# Patient Record
Sex: Female | Born: 1945
Health system: Southern US, Community
[De-identification: ages and names within clinical notes are randomized; demographics above are authoritative.]

## PROBLEM LIST (undated history)

## (undated) DIAGNOSIS — I491 Atrial premature depolarization: Secondary | ICD-10-CM

## (undated) DIAGNOSIS — Z8719 Personal history of other diseases of the digestive system: Secondary | ICD-10-CM

## (undated) DIAGNOSIS — Z8619 Personal history of other infectious and parasitic diseases: Secondary | ICD-10-CM

## (undated) DIAGNOSIS — F419 Anxiety disorder, unspecified: Secondary | ICD-10-CM

## (undated) DIAGNOSIS — I429 Cardiomyopathy, unspecified: Secondary | ICD-10-CM

## (undated) DIAGNOSIS — E669 Obesity, unspecified: Secondary | ICD-10-CM

## (undated) DIAGNOSIS — I4819 Other persistent atrial fibrillation: Secondary | ICD-10-CM

## (undated) DIAGNOSIS — K581 Irritable bowel syndrome with constipation: Secondary | ICD-10-CM

## (undated) DIAGNOSIS — K219 Gastro-esophageal reflux disease without esophagitis: Secondary | ICD-10-CM

## (undated) DIAGNOSIS — N183 Chronic kidney disease, stage 3 unspecified: Secondary | ICD-10-CM

## (undated) DIAGNOSIS — F329 Major depressive disorder, single episode, unspecified: Secondary | ICD-10-CM

## (undated) DIAGNOSIS — J302 Other seasonal allergic rhinitis: Secondary | ICD-10-CM

## (undated) DIAGNOSIS — L900 Lichen sclerosus et atrophicus: Secondary | ICD-10-CM

## (undated) DIAGNOSIS — E785 Hyperlipidemia, unspecified: Secondary | ICD-10-CM

## (undated) DIAGNOSIS — M47812 Spondylosis without myelopathy or radiculopathy, cervical region: Secondary | ICD-10-CM

## (undated) DIAGNOSIS — I6529 Occlusion and stenosis of unspecified carotid artery: Secondary | ICD-10-CM

## (undated) DIAGNOSIS — I82409 Acute embolism and thrombosis of unspecified deep veins of unspecified lower extremity: Secondary | ICD-10-CM

## (undated) DIAGNOSIS — I714 Abdominal aortic aneurysm, without rupture, unspecified: Secondary | ICD-10-CM

## (undated) DIAGNOSIS — I428 Other cardiomyopathies: Secondary | ICD-10-CM

## (undated) DIAGNOSIS — I77811 Abdominal aortic ectasia: Secondary | ICD-10-CM

## (undated) DIAGNOSIS — T7840XA Allergy, unspecified, initial encounter: Secondary | ICD-10-CM

## (undated) DIAGNOSIS — N289 Disorder of kidney and ureter, unspecified: Secondary | ICD-10-CM

## (undated) DIAGNOSIS — M1990 Unspecified osteoarthritis, unspecified site: Secondary | ICD-10-CM

## (undated) DIAGNOSIS — F32A Depression, unspecified: Secondary | ICD-10-CM

## (undated) DIAGNOSIS — I10 Essential (primary) hypertension: Secondary | ICD-10-CM

## (undated) DIAGNOSIS — I499 Cardiac arrhythmia, unspecified: Secondary | ICD-10-CM

## (undated) HISTORY — DX: Other cardiomyopathies: I42.8

## (undated) HISTORY — DX: Irritable bowel syndrome with constipation: K58.1

## (undated) HISTORY — DX: Abdominal aortic aneurysm, without rupture, unspecified: I71.40

## (undated) HISTORY — DX: Atrial premature depolarization: I49.1

## (undated) HISTORY — DX: Major depressive disorder, single episode, unspecified: F32.9

## (undated) HISTORY — DX: Personal history of other diseases of the digestive system: Z87.19

## (undated) HISTORY — PX: ABDOMINAL HYSTERECTOMY: SHX81

## (undated) HISTORY — PX: OTHER SURGICAL HISTORY: SHX169

## (undated) HISTORY — DX: Abdominal aortic ectasia: I77.811

## (undated) HISTORY — DX: Depression, unspecified: F32.A

## (undated) HISTORY — DX: Anxiety disorder, unspecified: F41.9

## (undated) HISTORY — DX: Occlusion and stenosis of unspecified carotid artery: I65.29

## (undated) HISTORY — DX: Obesity, unspecified: E66.9

## (undated) HISTORY — DX: Cardiomyopathy, unspecified: I42.9

## (undated) HISTORY — PX: CHOLECYSTECTOMY: SHX55

## (undated) HISTORY — DX: Hyperlipidemia, unspecified: E78.5

## (undated) HISTORY — DX: Gastro-esophageal reflux disease without esophagitis: K21.9

## (undated) HISTORY — DX: Other seasonal allergic rhinitis: J30.2

## (undated) HISTORY — PX: ABDOMINAL AORTIC ANEURYSM REPAIR: SUR1152

## (undated) HISTORY — DX: Essential (primary) hypertension: I10

## (undated) HISTORY — DX: Spondylosis without myelopathy or radiculopathy, cervical region: M47.812

## (undated) HISTORY — DX: Chronic kidney disease, stage 3 unspecified: N18.30

## (undated) HISTORY — DX: Other persistent atrial fibrillation: I48.19

## (undated) HISTORY — PX: CARPAL TUNNEL RELEASE: SHX101

## (undated) HISTORY — DX: Allergy, unspecified, initial encounter: T78.40XA

## (undated) HISTORY — PX: TOTAL ABDOMINAL HYSTERECTOMY W/ BILATERAL SALPINGOOPHORECTOMY: SHX83

## (undated) HISTORY — DX: Unspecified osteoarthritis, unspecified site: M19.90

## (undated) HISTORY — DX: Lichen sclerosus et atrophicus: L90.0

## (undated) HISTORY — DX: Personal history of other infectious and parasitic diseases: Z86.19

---

## 1998-03-08 ENCOUNTER — Other Ambulatory Visit: Admission: RE | Admit: 1998-03-08 | Discharge: 1998-03-08 | Payer: Self-pay | Admitting: Obstetrics and Gynecology

## 1998-12-20 ENCOUNTER — Observation Stay (HOSPITAL_COMMUNITY): Admission: RE | Admit: 1998-12-20 | Discharge: 1998-12-21 | Payer: Self-pay | Admitting: Obstetrics and Gynecology

## 1998-12-20 ENCOUNTER — Encounter: Payer: Self-pay | Admitting: Obstetrics and Gynecology

## 1999-04-11 ENCOUNTER — Encounter: Admission: RE | Admit: 1999-04-11 | Discharge: 1999-07-10 | Payer: Self-pay | Admitting: Obstetrics and Gynecology

## 1999-05-30 ENCOUNTER — Encounter: Admission: RE | Admit: 1999-05-30 | Discharge: 1999-05-30 | Payer: Self-pay | Admitting: Obstetrics and Gynecology

## 1999-05-30 ENCOUNTER — Encounter: Payer: Self-pay | Admitting: Obstetrics and Gynecology

## 1999-06-28 ENCOUNTER — Other Ambulatory Visit: Admission: RE | Admit: 1999-06-28 | Discharge: 1999-06-28 | Payer: Self-pay | Admitting: Obstetrics and Gynecology

## 2000-06-04 ENCOUNTER — Encounter: Payer: Self-pay | Admitting: Obstetrics and Gynecology

## 2000-06-04 ENCOUNTER — Encounter: Admission: RE | Admit: 2000-06-04 | Discharge: 2000-06-04 | Payer: Self-pay | Admitting: Obstetrics and Gynecology

## 2000-07-01 ENCOUNTER — Other Ambulatory Visit: Admission: RE | Admit: 2000-07-01 | Discharge: 2000-07-01 | Payer: Self-pay | Admitting: Obstetrics and Gynecology

## 2000-07-16 HISTORY — PX: COLONOSCOPY: SHX174

## 2001-04-08 ENCOUNTER — Encounter (INDEPENDENT_AMBULATORY_CARE_PROVIDER_SITE_OTHER): Payer: Self-pay

## 2001-04-08 ENCOUNTER — Other Ambulatory Visit: Admission: RE | Admit: 2001-04-08 | Discharge: 2001-04-08 | Payer: Self-pay | Admitting: Gastroenterology

## 2001-04-23 ENCOUNTER — Encounter: Payer: Self-pay | Admitting: Gastroenterology

## 2001-04-23 ENCOUNTER — Ambulatory Visit (HOSPITAL_COMMUNITY): Admission: RE | Admit: 2001-04-23 | Discharge: 2001-04-23 | Payer: Self-pay | Admitting: Gastroenterology

## 2001-06-05 ENCOUNTER — Encounter: Admission: RE | Admit: 2001-06-05 | Discharge: 2001-06-05 | Payer: Self-pay | Admitting: Family Medicine

## 2001-06-05 ENCOUNTER — Encounter: Payer: Self-pay | Admitting: Family Medicine

## 2002-06-10 ENCOUNTER — Encounter: Admission: RE | Admit: 2002-06-10 | Discharge: 2002-06-10 | Payer: Self-pay | Admitting: Family Medicine

## 2002-06-10 ENCOUNTER — Encounter: Payer: Self-pay | Admitting: Family Medicine

## 2002-07-16 HISTORY — PX: ESOPHAGOGASTRODUODENOSCOPY ENDOSCOPY: SHX5814

## 2003-06-24 ENCOUNTER — Encounter: Admission: RE | Admit: 2003-06-24 | Discharge: 2003-06-24 | Payer: Self-pay | Admitting: Family Medicine

## 2003-07-20 ENCOUNTER — Encounter: Admission: RE | Admit: 2003-07-20 | Discharge: 2003-07-20 | Payer: Self-pay | Admitting: Family Medicine

## 2003-09-22 ENCOUNTER — Encounter: Admission: RE | Admit: 2003-09-22 | Discharge: 2003-09-22 | Payer: Self-pay | Admitting: Family Medicine

## 2004-07-13 ENCOUNTER — Ambulatory Visit: Payer: Self-pay | Admitting: Family Medicine

## 2004-07-20 ENCOUNTER — Ambulatory Visit: Payer: Self-pay | Admitting: Family Medicine

## 2004-07-21 ENCOUNTER — Encounter: Admission: RE | Admit: 2004-07-21 | Discharge: 2004-07-21 | Payer: Self-pay | Admitting: Family Medicine

## 2004-11-23 ENCOUNTER — Ambulatory Visit: Payer: Self-pay | Admitting: Family Medicine

## 2005-06-21 ENCOUNTER — Ambulatory Visit: Payer: Self-pay | Admitting: Family Medicine

## 2005-07-17 ENCOUNTER — Ambulatory Visit: Payer: Self-pay | Admitting: Family Medicine

## 2005-08-03 ENCOUNTER — Encounter: Admission: RE | Admit: 2005-08-03 | Discharge: 2005-08-03 | Payer: Self-pay | Admitting: Family Medicine

## 2005-09-24 ENCOUNTER — Ambulatory Visit: Payer: Self-pay | Admitting: Family Medicine

## 2006-08-05 ENCOUNTER — Encounter: Admission: RE | Admit: 2006-08-05 | Discharge: 2006-08-05 | Payer: Self-pay | Admitting: Family Medicine

## 2007-08-07 ENCOUNTER — Encounter: Admission: RE | Admit: 2007-08-07 | Discharge: 2007-08-07 | Payer: Self-pay | Admitting: Family Medicine

## 2008-07-15 ENCOUNTER — Encounter: Admission: RE | Admit: 2008-07-15 | Discharge: 2008-07-15 | Payer: Self-pay | Admitting: Family Medicine

## 2008-08-09 ENCOUNTER — Encounter: Admission: RE | Admit: 2008-08-09 | Discharge: 2008-08-09 | Payer: Self-pay | Admitting: Family Medicine

## 2008-08-17 ENCOUNTER — Encounter: Admission: RE | Admit: 2008-08-17 | Discharge: 2008-08-17 | Payer: Self-pay | Admitting: Family Medicine

## 2008-08-25 ENCOUNTER — Ambulatory Visit (HOSPITAL_COMMUNITY): Admission: RE | Admit: 2008-08-25 | Discharge: 2008-08-25 | Payer: Self-pay | Admitting: Neurosurgery

## 2008-09-09 DIAGNOSIS — M47812 Spondylosis without myelopathy or radiculopathy, cervical region: Secondary | ICD-10-CM | POA: Insufficient documentation

## 2008-09-09 DIAGNOSIS — IMO0002 Reserved for concepts with insufficient information to code with codable children: Secondary | ICD-10-CM | POA: Insufficient documentation

## 2008-09-10 ENCOUNTER — Ambulatory Visit: Payer: Self-pay | Admitting: Critical Care Medicine

## 2008-09-10 DIAGNOSIS — J8410 Pulmonary fibrosis, unspecified: Secondary | ICD-10-CM | POA: Insufficient documentation

## 2008-09-15 ENCOUNTER — Encounter: Payer: Self-pay | Admitting: Critical Care Medicine

## 2008-09-15 ENCOUNTER — Ambulatory Visit: Payer: Self-pay | Admitting: Critical Care Medicine

## 2008-09-15 ENCOUNTER — Ambulatory Visit: Admission: RE | Admit: 2008-09-15 | Discharge: 2008-09-15 | Payer: Self-pay | Admitting: Critical Care Medicine

## 2008-09-20 ENCOUNTER — Ambulatory Visit: Payer: Self-pay | Admitting: Critical Care Medicine

## 2008-09-21 ENCOUNTER — Encounter: Payer: Self-pay | Admitting: Critical Care Medicine

## 2008-09-27 ENCOUNTER — Encounter: Payer: Self-pay | Admitting: Critical Care Medicine

## 2008-09-28 ENCOUNTER — Ambulatory Visit: Payer: Self-pay | Admitting: Critical Care Medicine

## 2008-10-07 ENCOUNTER — Encounter: Admission: RE | Admit: 2008-10-07 | Discharge: 2008-10-07 | Payer: Self-pay | Admitting: Neurosurgery

## 2008-10-29 ENCOUNTER — Encounter: Admission: RE | Admit: 2008-10-29 | Discharge: 2008-10-29 | Payer: Self-pay | Admitting: Neurosurgery

## 2008-12-20 ENCOUNTER — Ambulatory Visit: Payer: Self-pay | Admitting: Critical Care Medicine

## 2009-08-10 ENCOUNTER — Encounter: Admission: RE | Admit: 2009-08-10 | Discharge: 2009-08-10 | Payer: Self-pay | Admitting: Family Medicine

## 2010-08-14 ENCOUNTER — Encounter
Admission: RE | Admit: 2010-08-14 | Discharge: 2010-08-14 | Payer: Self-pay | Source: Home / Self Care | Attending: Family Medicine | Admitting: Family Medicine

## 2010-08-17 NOTE — Miscellaneous (Signed)
Summary: Bronchoscopy  Clinical Lists Changes  Observations: Added new observation of BRONCHOSCOPY: PROCEDURE:  The Pentax bronchoscope was introduced via the right naris. The upper airways were visualized and remarkable.  The entire tracheobronchial tree was visualized and revealed no endobronchial lesions.  Attention was then paid to the left upper lobe orifice.  The left upper lobe anterior segment was biopsied times five.  Washings were obtained.  Complications were none.   IMPRESSION:  Left upper lobe pulmonary infiltrate, rule out bronchiolitis obliterans, organized pneumonia.   RECOMMENDATIONS:  Follow up microbiology and pathology.    (09/15/2008 9:10)      Bronchoscopy  Procedure date:  09/15/2008  Findings:      PROCEDURE:  The Pentax bronchoscope was introduced via the right naris. The upper airways were visualized and remarkable.  The entire tracheobronchial tree was visualized and revealed no endobronchial lesions.  Attention was then paid to the left upper lobe orifice.  The left upper lobe anterior segment was biopsied times five.  Washings were obtained.  Complications were none.   IMPRESSION:  Left upper lobe pulmonary infiltrate, rule out bronchiolitis obliterans, organized pneumonia.   RECOMMENDATIONS:  Follow up microbiology and pathology.     Comments:      Specimen - Transbronchial Biopsy : Location:L[facility]   PROCEDURE:  The Pentax bronchoscope was introduced via the right naris. The upper airways were visualized and remarkable.  The entire tracheobronchial tree was visualized and revealed no endobronchial lesions.  Attention was then paid to the left upper lobe orifice.  The left upper lobe anterior segment was biopsied times five.  Washings were obtained.  Complications were none.   IMPRESSION:  Left upper lobe pulmonary infiltrate, rule out bronchiolitis obliterans, organized pneumonia.   RECOMMENDATIONS:  Follow up microbiology and  pathology.      Bronchoscopy  Procedure date:  09/15/2008  Findings:      PROCEDURE:  The Pentax bronchoscope was introduced via the right naris. The upper airways were visualized and remarkable.  The entire tracheobronchial tree was visualized and revealed no endobronchial lesions.  Attention was then paid to the left upper lobe orifice.  The left upper lobe anterior segment was biopsied times five.  Washings were obtained.  Complications were none.   IMPRESSION:  Left upper lobe pulmonary infiltrate, rule out bronchiolitis obliterans, organized pneumonia.   RECOMMENDATIONS:  Follow up microbiology and pathology.     Comments:      Specimen - Transbronchial Biopsy : Location:L[facility]   PROCEDURE:  The Pentax bronchoscope was introduced via the right naris. The upper airways were visualized and remarkable.  The entire tracheobronchial tree was visualized and revealed no endobronchial lesions.  Attention was then paid to the left upper lobe orifice.  The left upper lobe anterior segment was biopsied times five.  Washings were obtained.  Complications were none.   IMPRESSION:  Left upper lobe pulmonary infiltrate, rule out bronchiolitis obliterans, organized pneumonia.   RECOMMENDATIONS:  Follow up microbiology and pathology.

## 2010-08-17 NOTE — Miscellaneous (Signed)
Summary: Orders Update pft charges  Clinical Lists Changes  Orders: Added new Service order of Carbon Monoxide diffusing w/capacity (94720) - Signed Added new Service order of Lung Volumes (94240) - Signed Added new Service order of Spirometry (Pre & Post) (94060) - Signed 

## 2010-08-17 NOTE — Assessment & Plan Note (Signed)
Summary: Pulmonary Consultation   Referred by:  Dr. Newell Coral  Chief Complaint:  Pulmonary consult for abnormal CXr/CT chest..  History of Present Illness: Pulmonary Consultation     This is a 65 years old female who presents with an abnormal radiology finding.  The patient denies history of diagnosed COPD, shortness of breath, chest tightness, chest pain worse with breathing and coughing, wheezing, cough, mucous production, nocturnal awakening, exercise induced symptoms, and congestion.  The abnormality is described as pulmonary infiltrate.  The infiltrate  is described as left upper lobe and is ill-defined ground-glass attenuation.  No adenopathy is seen.  No other lesions are seen.     The pt was to have routine carpel tunnel surgery and was discovered to have an abn CXR.  F/U CT Chest revealed LUL abn with above noted changes.  Pt denies cough. There is no chest pain or wheeze. No f/c/s.  Had bronchitis several years ago now resolved.  No exposure hx.  Has had a dry cough for one month.   Pt has minimal smoking history.      Prior Medications Reviewed Using: List Brought by Patient  Updated Prior Medication List: CLONAZEPAM 0.5 MG TABS (CLONAZEPAM) 1 by mouth two times a day LISINOPRIL-HYDROCHLOROTHIAZIDE 10-12.5 MG TABS (LISINOPRIL-HYDROCHLOROTHIAZIDE) 1 by mouth daily SERTRALINE HCL 100 MG TABS (SERTRALINE HCL) 1 by mouth daily TRAMADOL-ACETAMINOPHEN 37.5-325 MG TABS (TRAMADOL-ACETAMINOPHEN) 1 by mouth every 8 hours prn VIVELLE-DOT 0.075 MG/24HR PTTW (ESTRADIOL) Apply one patch twice weekly PANTOPRAZOLE SODIUM 40 MG TBEC (PANTOPRAZOLE SODIUM) 1 by mouth daily  Current Allergies (reviewed today): No known allergies  Past Medical History:    Reviewed history from 09/09/2008 and no changes required:       Current Problems:        DEGENERATIVE DISC DISEASE (ICD-722.6)       SPONDYLITIS (ICD-720.9)       HYPERTENSION (ICD-401.9)       G E R D       Irritable Bowel  Syndrome  Past Surgical History:    Reviewed history from 09/09/2008 and no changes required:       Cholecystectomy  1998       Hysterectomy  1997       Right thumb surgery for torn ligament--30+ yrs ago       Carpal Tunnel R 2010       Bladder Tack  Family History:    Reviewed history from 09/09/2008 and no changes required:       Family History Diabetes       Family History Hypertension  Social History:    Reviewed history from 09/09/2008 and no changes required:       Patient never smoked.        Married.       Works as a Doctor, hospital at [employer]   Risk Factors:  Tobacco use:  quit    Year quit:  1990    Pack-years:  1-2 cigarettes for less than 1 year Passive smoke exposure:  no  Review of Systems      See HPI for Pulmonary, Cardiac, General, and ENT review of systems.  General      Denies fever, chills, sweats, fatigue, malaise, and weight loss.  Eyes      Denies blurring, eye irritation, and vision loss.  ENT      Complains of nasal congestion and post nasal drip.      Denies nosebleeds, sore throat, hoarseness, and difficulty swallowing.  CV  Complains of lightheadedness.      Denies difficulty breathing at night, near fainting, chest pain or discomfort, racing/skipping heart beats, shortness of breath with exertion, palpitations, swelling of hands or feet, difficulty breathing while lying down, and fainting.  Resp      Complains of cough.      Denies sleep distrurbances due to breathing, shortness of breath, difficulty breathing at rest, excessive sputum, coughing up blood, wheezing, pleurisy, and excessive snoring.  GI      Denies indigestion, vomiting blood, nausea, vomiting, bloody stools, and heartburn/acid reflux.  GU      Denies urinary frequency, blood in urine, foul urinary discharge, kidney pain, urinary urgency, inability to empty bladder, night time urination, painful urination, and trouble starting urinary stream.  MS       Complains of arthritis.      Denies joint swelling, joint pain, muscle cramps, muscle weakness, and muscle aches.  Derm      Denies suspicious lesions, itching, rash, night sweats, and excessive perspiration.  Neuro      Complains of headaches.      Denies poor balance, difficulty with concentration, inability to speak, numbness, tingling, falling down, weakness, seizures, visual disturbances, fainting, and dizziness.  Endo      Denies heat intolerance, cold intolerance, and excessive hunger.  The review of systems is negative for Heme/Lymph and Allergy.  Vital Signs:  Patient Profile:   65 Years Old Female Weight:      212.4 pounds O2 Sat:      95 % O2 treatment:    Room Air Temp:     98.4 degrees F oral Pulse rate:   71 / minute BP sitting:   124 / 68  (left arm) Cuff size:   regular  Vitals Entered By: Michel Bickers CMA (September 10, 2008 10:07 AM)                Physical Exam  Gen: Pleasant, well-nourished, in no distress,  normal affect ENT: No lesions,  mouth clear,  oropharynx clear, no postnasal drip Neck: No JVD, no TMG, no carotid bruits Lungs: No use of accessory muscles, no dullness to percussion, clear without rales or rhonchi Cardiovascular: RRR, heart sounds normal, no murmur or gallops, no peripheral edema Abdomen: soft and NT, no HSM,  BS normal Musculoskeletal: No deformities, no cyanosis or clubbing Neuro: alert, non focal Skin: Warm, no lesions or rashes    CT of Chest  Procedure date:  08/25/2008  Findings:      abnormal:    Nodular and ill-defined ground-glass attenuation in the LUL.  No lymphadenopathy seen.  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 1 by mouth two times a day 2)  Pantoprazole Sodium 40 Mg Tbec (Pantoprazole sodium) .Marland Kitchen.. 1 by mouth daily  Complete Medication List: 1)  Clonazepam 0.5 Mg Tabs (Clonazepam) .Marland Kitchen.. 1 by mouth two times a day 2)  Lisinopril-hydrochlorothiazide 10-12.5 Mg Tabs (Lisinopril-hydrochlorothiazide) .Marland Kitchen.. 1 by mouth daily 3)  Sertraline Hcl 100 Mg Tabs (Sertraline hcl) .Marland Kitchen.. 1 by mouth daily 4)  Tramadol-acetaminophen 37.5-325 Mg Tabs (Tramadol-acetaminophen) .Marland Kitchen.. 1 by mouth every  8 hours prn 5)  Vivelle-dot 0.075 Mg/24hr Pttw (Estradiol) .... Apply one patch twice weekly 6)  Pantoprazole Sodium 40 Mg Tbec (Pantoprazole sodium) .Marland Kitchen.. 1 by mouth daily  Patient Instructions: 1)  Pulmonary Function Studies to be obtained 2)  Bronch[facility] to [facility] on [facility])  Return two weeks  Appended Document: Pulmonary Consultation fax Shirlean Kelly

## 2010-08-17 NOTE — Miscellaneous (Signed)
Summary: Prednisone Rx  Clinical Lists Changes  Medications: Added new medication of PREDNISONE 10 MG  TABS (PREDNISONE) Take as directed 4 each am x 4 days, 3 x 4 days, 2 x 4 days, then one a day and stay - Signed Rx of PREDNISONE 10 MG  TABS (PREDNISONE) Take as directed 4 each am x 4 days, 3 x 4 days, 2 x 4 days, then one a day and stay;  #100 x 1;  Signed;  Entered by: Storm Frisk MD;  Authorized by: Storm Frisk MD;  Method used: Electronically to [facility] [id number] [address], [place], [place], [place]  [address], Ph: [phone] or [phone], Fax: [phone]    Prescriptions: PREDNISONE 10 MG  TABS (PREDNISONE) Take as directed 4 each am x 4 days, 3 x 4 days, 2 x 4 days, then one a day and stay  #100 x 1   Entered and Authorized by:   Storm Frisk MD   Signed by:   Storm Frisk MD on 09/21/2008   Method used:   [facility]ronically to        [facility]  [address] [phone]* (retail)       [address]       [place], [place]  [address]       Ph: [phone] or [phone]       Fax: [phone]   RxID:   [id number]   Appended Document: Prednisone Rx Ann White    call this pt and tell her prednisone has been called in to [facility] [address]  pw  Appended Document: Prednisone Rx Pt is aware of Prednisone RX and is scheduled for f/u on 09-28-08 with PW.

## 2010-08-17 NOTE — Miscellaneous (Signed)
Summary: PFT  Clinical Lists Changes  Observations: Added new observation of PFT COMMENTS: Normal spirometry, lung volumes but reduction in DLCO (09/27/2008 17:24) Added new observation of DLCO/VA%EXP: 122 % (09/27/2008 17:24) Added new observation of DLCO/VA: 4.55 % (09/27/2008 17:24) Added new observation of DLCO % EXPEC: 66 % (09/27/2008 17:24) Added new observation of DLCO: 16.0 % (09/27/2008 17:24) Added new observation of RV % EXPECT: 68 % (09/27/2008 17:24) Added new observation of RV: 1.25 L (09/27/2008 17:24) Added new observation of TLC % EXPECT: 80 % (09/27/2008 17:24) Added new observation of TLC: 3.93 L (09/27/2008 17:24) Added new observation of FEF2575%EXPS: 104 % (09/27/2008 17:24) Added new observation of PSTFEF25/75P: 2.56  (09/27/2008 17:24) Added new observation of PSTFEF25/75%: 2.67 L/min (09/27/2008 17:24) Added new observation of FEV1FVCPRDPS: 73 % (09/27/2008 17:24) Added new observation of PSTFEV1/FVC: 84 % (09/27/2008 17:24) Added new observation of POSTFEV1%PRD: 99 % (09/27/2008 17:24) Added new observation of FEV1PRDPST: 2.24 L (09/27/2008 17:24) Added new observation of POST FEV1: 2.21 L/min (09/27/2008 17:24) Added new observation of POST FVC%EXP: 87 % (09/27/2008 17:24) Added new observation of FVCPRDPST: 3.04 L/min (09/27/2008 17:24) Added new observation of POST FVC: 2.65 L (09/27/2008 17:24) Added new observation of FEF % EXPEC: 101 % (09/27/2008 17:24) Added new observation of FEF25-75%PRE: 2.56 L/min (09/27/2008 17:24) Added new observation of FEF 25-75%: 2.58 L/min (09/27/2008 17:24) Added new observation of FEV1/FVC PRE: 73 % (09/27/2008 17:24) Added new observation of FEV1/FVC: 83 % (09/27/2008 17:24) Added new observation of FEV1 % EXP: 98 % (09/27/2008 17:24) Added new observation of FEV1 PREDICT: 2.24 L (09/27/2008 17:24) Added new observation of FEV1: 2.20 L (09/27/2008 17:24) Added new observation of FVC % EXPECT: 87 % (09/27/2008  17:24) Added new observation of FVC PREDICT: 3.04 L (09/27/2008 17:24) Added new observation of FVC: 2.66 L (09/27/2008 17:24) Added new observation of HEIGHT: 64 in (09/27/2008 17:24) Added new observation of PFT DATE: 09/20/2008  (09/27/2008 17:24)  Pulmonary Function Test Date: 09/20/2008 Height (in.): 64 Gender: Female  Pre-Spirometry FVC    Value: 2.66 L/min   Pred: 3.04 L/min     % Pred: 87 % FEV1    Value: 2.20 L     Pred: 2.24 L     % Pred: 98 % FEV1/FVC  Value: 83 %     Pred: 73 %    FEF 25-75  Value: 2.58 L/min   Pred: 2.56 L/min     % Pred: 101 %  Post-Spirometry FVC    Value: 2.65 L/min   Pred: 3.04 L/min     % Pred: 87 % FEV1    Value: 2.21 L     Pred: 2.24 L     % Pred: 99 % FEV1/FVC  Value: 84 %     Pred: 73 %    FEF 25-75  Value: 2.67 L/min   Pred: 2.56 L/min     % Pred: 104 %  Lung Volumes TLC    Value: 3.93 L   % Pred: 80 % RV    Value: 1.25 L   % Pred: 68 % DLCO    Value: 16.0 %   % Pred: 66 % DLCO/VA  Value: 4.55 %   % Pred: 122 %  Comments: Normal spirometry, lung volumes but reduction in DLCO  Entered by:   Michel Bickers CMA  September 27, 2008 5:28 PM

## 2010-08-17 NOTE — Assessment & Plan Note (Signed)
Summary: Pulmonary OV   Visit Type:  Follow-up Copy to:  Dr. Newell Coral  CC:  Bronch an d PFT results.  History of Present Illness: Pulmonary Consultation     This is a 65 years old female who presents with an abnormal radiology finding.  The patient denies history of diagnosed COPD, shortness of breath, chest tightness, chest pain worse with breathing and coughing, wheezing, cough, mucous production, nocturnal awakening, exercise induced symptoms, and congestion.  The abnormality is described as pulmonary infiltrate.  The infiltrate  is described as left upper lobe and is ill-defined ground-glass attenuation.  No adenopathy is seen.  No other lesions are seen.     The pt was to have routine carpel tunnel surgery and was discovered to have an abn CXR.  F/U CT Chest revealed LUL abn with above noted changes.  Pt denies cough. There is no chest pain or wheeze. No f/c/s.  Had bronchitis several years ago now resolved.  No exposure hx.  Has had a dry cough for one month.   Pt has minimal smoking history.   September 28, 2008 4:30 PM FOB was done and bx c/w BOOP.  Pt now on pred pulse.  Notes hot flashes and weight gain.  Chronic cough and is better.  Feels bloated.  Pt is less dyspneic.  No chest pain.    Allergies: No Known Drug Allergies  Past History:  Past Medical History:    Reviewed history from 09/10/2008 and no changes required:    Current Problems:     DEGENERATIVE DISC DISEASE (ICD-722.6)    SPONDYLITIS (ICD-720.9)    HYPERTENSION (ICD-401.9)    G E R D    Irritable Bowel Syndrome  Review of Systems       The patient complains of shortness of breath with activity and non-productive cough.  The patient denies shortness of breath at rest, productive cough, coughing up blood, chest pain, irregular heartbeats, acid heartburn, indigestion, loss of appetite, weight change, abdominal pain, difficulty swallowing, sore throat, tooth/dental problems, headaches, nasal  congestion/difficulty breathing through nose, sneezing, itching, ear ache, anxiety, depression, hand/feet swelling, joint stiffness or pain, rash, change in color of mucus, and fever.    Vital Signs:  Patient profile:   65 year old female Height:      64 inches Weight:      215.38 pounds BMI:     37.10 O2 Sat:      95 % Temp:     97.6 degrees F oral Pulse rate:   75 / minute BP sitting:   148 / 74  (left arm) Cuff size:   regular  Vitals Entered By: Marinus Maw (September 28, 2008 4:23 PM)  O2 Sat at Rest %:  95% O2 Flow:  room air CC: Bronch an d PFT results Comments Medications reviewed with patient Marinus Maw  September 28, 2008 4:24 PM   FINAL DIAGNOSIS   ***MICROSCOPIC EXAMINATION AND DIAGNOSIS***   LEFT LUNG, UPPER LOBE, TRANSBRONCHIAL BIOPSY: -  SMALL EPITHELIOID GRANULOMATA ASSOCIATED WITH FIBROSIS, SEE COMMENT.        COMMENT The biopsy fragments display a few very small epithelioid granulomata with lack of necrosis.  This is associated with a focus of fibrosis which is reminiscent of organizing pneumonitis. Special stains for microorganisms will be performed and reported in an addendum.  The differential diagnosis includes but not limited changes related to hypersensitivity, sarcoidosis, infection.  Clinical correlation is recommended. (BNS:jy)  09/16/08    Pulmonary Function Test Height (  in.): 10 Gender: Female  Impression & Recommendations:  Problem # 1:  PULMONARY FIBROSIS, POSTINFLAMMATORY (ICD-515) Assessment Unchanged BOOP RUL with positive biopsy.  plan prednisone pulse and taper. Reduce prednisone by one every three days until you get to one a day then after FOUR days reduce to one every OTHER day for FOUR days then stop  Complete Medication List: 1)  Clonazepam 0.5 Mg Tabs (Clonazepam) .Marland Kitchen.. 1 by mouth two times a day 2)  Lisinopril-hydrochlorothiazide 10-12.5 Mg Tabs (Lisinopril-hydrochlorothiazide) .Marland Kitchen.. 1 by mouth daily 3)   Sertraline Hcl 100 Mg Tabs (Sertraline hcl) .Marland Kitchen.. 1 by mouth daily 4)  Tramadol-acetaminophen 37.5-325 Mg Tabs (Tramadol-acetaminophen) .Marland Kitchen.. 1 by mouth every 8 hours prn 5)  Vivelle-dot 0.075 Mg/24hr Pttw (Estradiol) .... Apply one patch twice weekly 6)  Pantoprazole Sodium 40 Mg Tbec (Pantoprazole sodium) .Marland Kitchen.. 1 by mouth daily 7)  Prednisone 10 Mg Tabs (Prednisone) .... Take as directed 4 each am x 4 days, 3 x 4 days, 2 x 4 days, then one a day and stay  Other Orders: Est. Patient Level IV ([address])  Clinical Reports Reviewed:  Bronchoscopy:  09/15/2008: Bronchoscopy:  PROCEDURE:  The Pentax bronchoscope was introduced via the right naris. The upper airways were visualized and remarkable.  The entire tracheobronchial tree was visualized and revealed no endobronchial lesions.  Attention was then paid to the left upper lobe orifice.  The left upper lobe anterior segment was biopsied times five.  Washings were obtained.  Complications were none.   IMPRESSION:  Left upper lobe pulmonary infiltrate, rule out bronchiolitis obliterans, organized pneumonia.   RECOMMENDATIONS:  Follow up microbiology and pathology.      Patient Instructions: 1)  Reduce prednisone by one every three days until you get to one a day then after FOUR days reduce to one every OTHER day for FOUR days then stop 2)  Return 2 months  Appended Document: Pulmonary OV fax Shirlean Kelly

## 2010-08-17 NOTE — Assessment & Plan Note (Signed)
Summary: Pulmonary OV   Copy to:  Dr. Newell Coral Primary Provider/Referring Provider:  Dr. Benedetto Goad  CC:  Followup pulmonary fibrosis.  Pt states that breahting has been well.  No complaints today.Marland Kitchen  History of Present Illness: Pulmonary Consultation     This is a 65 years old female with BOOP s/p steroid rx for same with resolution of cough and pulm infiltrate on CXR by 3/10.  September 28, 2008 4:30 PM FOB was done and bx c/w BOOP.  Pt now on pred pulse.  Notes hot flashes and weight gain.  Chronic cough and is better.  Feels bloated.  Pt is less dyspneic.  No chest pain.  December 20, 2008 3:44 PM at last ov we rec to wean off prednisone;  now pt is doing well.  No new complaints.  No cough or dyspnea. Pt denies any significant sore throat, nasal congestion or excess secretions, fever, chills, sweats, unintended weight loss, pleurtic or exertional chest pain, orthopnea PND, or leg swelling Pt denies any increase in rescue therapy over baseline, denies waking up needing it or having any early am or nocturnal exacerbations of coughing/wheezing/or dyspnea. No chest pain.  No new resp c/o.    Current Medications (verified): 1)  Clonazepam 0.5 Mg Tabs (Clonazepam) .Marland Kitchen.. 1 By Mouth Two Times A Day 2)  Lisinopril-Hydrochlorothiazide 10-12.5 Mg Tabs (Lisinopril-Hydrochlorothiazide) .Marland Kitchen.. 1 By Mouth Daily 3)  Sertraline Hcl 100 Mg Tabs (Sertraline Hcl) .Marland Kitchen.. 1 By Mouth Daily 4)  Tramadol-Acetaminophen 37.5-325 Mg Tabs (Tramadol-Acetaminophen) .Marland Kitchen.. 1 By Mouth Every 8 Hours Prn 5)  Vivelle-Dot 0.075 Mg/24hr Pttw (Estradiol) .... Apply One Patch Twice Weekly 6)  Pantoprazole Sodium 40 Mg Tbec (Pantoprazole Sodium) .Marland Kitchen.. 1 By Mouth Daily  Allergies (verified): No Known Drug Allergies  Past History:  Past Medical History: Current Problems:  DEGENERATIVE DISC DISEASE (ICD-722.6) SPONDYLITIS (ICD-720.9) HYPERTENSION (ICD-401.9) G E R D Irritable Bowel Syndrome Hx BOOP now resolved 12/2008     -FOB  2010  Past Pulmonary History:  Pulmonary History: BOOP on FOB 2010 s/p steroid rx with resolution of abn on CXR by 12/2008 Pulmonary Function Test  Date: 09/20/2008 Height (in.): 64 Gender: Female  Pre-Spirometry  FVC     Value: 2.66 L/min   Pred: 3.04 L/min     % Pred: 87 % FEV1     Value: 2.20 L     Pred: 2.24 L     % Pred: 98 % FEV1/FVC   Value: 83 %     Pred: 73 %     FEF 25-75   Value: 2.58 L/min   Pred: 2.56 L/min     % Pred: 101 %  Post-Spirometry  FVC     Value: 2.65 L/min   Pred: 3.04 L/min     % Pred: 87 % FEV1     Value: 2.21 L     Pred: 2.24 L     % Pred: 99 % FEV1/FVC   Value: 84 %     Pred: 73 %     FEF 25-75   Value: 2.67 L/min   Pred: 2.56 L/min     % Pred: 104 %  Lung Volumes  TLC     Value: 3.93 L   % Pred: 80 % RV     Value: 1.25 L   % Pred: 68 % DLCO     Value: 16.0 %   % Pred: 66 % DLCO/VA   Value: 4.55 %   % Pred: 122 %  Comments:  Normal spirometry, lung volumes but reduction in DLCO  Review of Systems       The patient complains of nasal congestion/difficulty breathing through nose.  The patient denies shortness of breath with activity, shortness of breath at rest, productive cough, non-productive cough, coughing up blood, chest pain, irregular heartbeats, acid heartburn, indigestion, loss of appetite, weight change, abdominal pain, difficulty swallowing, sore throat, tooth/dental problems, headaches, sneezing, itching, ear ache, anxiety, depression, hand/feet swelling, joint stiffness or pain, rash, change in color of mucus, and fever.    Vital Signs:  Patient profile:   65 year old female Weight:      211.13 pounds O2 Sat:      96 % on Room air Temp:     98.5 degrees F oral Pulse rate:   71 / minute BP sitting:   118 / 60  (left arm) Cuff size:   large  Vitals Entered By: Vernie Murders (December 20, 2008 3:42 PM)  O2 Flow:  Room air  Physical Exam  Additional Exam:  Gen: Pleasant, well-nourished, in no distress,  normal affect ENT: No lesions,   mouth clear,  oropharynx clear, no postnasal drip Neck: No JVD, no TMG, no carotid bruits Lungs: No use of accessory muscles, no dullness to percussion, clear without rales or rhonchi Cardiovascular: RRR, heart sounds normal, no murmur or gallops, no peripheral edema Abdomen: soft and NT, no HSM,  BS normal Musculoskeletal: No deformities, no cyanosis or clubbing Neuro: alert, non focal Skin: Warm, no lesions or rashes    CXR  Procedure date:  12/20/2008  Findings:      IMPRESSION: Interval clearing of lingular pneumonia.    Impression & Recommendations:  Problem # 1:  PULMONARY FIBROSIS, POSTINFLAMMATORY (ICD-515) Assessment Improved  BOOP RUL with positive biopsy.  CXR now shows resolution of infiltrate 12/2008  plan No further steroid rx rov 4 months  Complete Medication List: 1)  Clonazepam 0.5 Mg Tabs (Clonazepam) .Marland Kitchen.. 1 by mouth two times a day 2)  Lisinopril-hydrochlorothiazide 10-12.5 Mg Tabs (Lisinopril-hydrochlorothiazide) .Marland Kitchen.. 1 by mouth daily 3)  Sertraline Hcl 100 Mg Tabs (Sertraline hcl) .Marland Kitchen.. 1 by mouth daily 4)  Tramadol-acetaminophen 37.5-325 Mg Tabs (Tramadol-acetaminophen) .Marland Kitchen.. 1 by mouth every 8 hours prn 5)  Vivelle-dot 0.075 Mg/24hr Pttw (Estradiol) .... Apply one patch twice weekly 6)  Pantoprazole Sodium 40 Mg Tbec (Pantoprazole sodium) .Marland Kitchen.. 1 by mouth daily  Other Orders: Est. Patient Level III (16109) T-2 View CXR, Same Day (71020.5TC)  Patient Instructions: 1)  No further prednisone 2)  Chest xray today, I will call with result 3)  Return 4 months or sooner if symptoms recur  Appended Document: Pulmonary OV fax Robt Agnes Lawrence

## 2010-10-26 LAB — AFB CULTURE WITH SMEAR (NOT AT ARMC): Acid Fast Smear: NONE SEEN

## 2010-10-26 LAB — FUNGUS CULTURE W SMEAR: Fungal Smear: NONE SEEN

## 2010-10-26 LAB — CULTURE, RESPIRATORY W GRAM STAIN

## 2010-10-26 LAB — CULTURE, RESPIRATORY

## 2010-10-31 LAB — CBC
HCT: 34.5 % — ABNORMAL LOW (ref 36.0–46.0)
Hemoglobin: 12.2 g/dL (ref 12.0–15.0)
MCHC: 35.3 g/dL (ref 30.0–36.0)
MCV: 86.2 fL (ref 78.0–100.0)
Platelets: 219 10*3/uL (ref 150–400)
RBC: 4.01 MIL/uL (ref 3.87–5.11)
RDW: 14.1 % (ref 11.5–15.5)
WBC: 7 10*3/uL (ref 4.0–10.5)

## 2010-10-31 LAB — BASIC METABOLIC PANEL
BUN: 21 mg/dL (ref 6–23)
CO2: 25 mEq/L (ref 19–32)
Calcium: 9.8 mg/dL (ref 8.4–10.5)
Chloride: 100 mEq/L (ref 96–112)
Creatinine, Ser: 0.92 mg/dL (ref 0.4–1.2)
GFR calc Af Amer: >60 mL/min (ref >60)
GFR calc non Af Amer: >60 mL/min (ref >60)
Glucose, Bld: 92 mg/dL (ref 70–99)
Potassium: 4.4 mEq/L (ref 3.5–5.1)
Sodium: 134 mEq/L — ABNORMAL LOW (ref 135–145)

## 2010-11-28 NOTE — Op Note (Signed)
Ann White, Ann White                   ACCOUNT NO.:  [id number]   MEDICAL RECORD NO.:  [id number]          PATIENT TYPE:  AMB   LOCATION:  CARD                         FACILITY:  [facility]   PHYSICIAN:  Charlcie Cradle. Delford Field, MD, FCCPDATE OF BIRTH:  03-06-47   DATE OF PROCEDURE:  09/15/2008  DATE OF DISCHARGE:                               OPERATIVE REPORT   CHIEF COMPLAINT:  Left upper lobe infiltrate, evaluate for cause.   OPERATOR:  Charlcie Cradle. Delford Field, MD, FCCP.   ANESTHESIA:  1% Xylocaine local.   PREMEDICATION:  Versed 5 mg, fentanyl 50 mcg IV push.   PROCEDURE:  The Pentax bronchoscope was introduced via the right naris.  The upper airways were visualized and remarkable.  The entire  tracheobronchial tree was visualized and revealed no endobronchial  lesions.  Attention was then paid to the left upper lobe orifice.  The  left upper lobe anterior segment was biopsied times five.  Washings were  obtained.  Complications were none.   IMPRESSION:  Left upper lobe pulmonary infiltrate, rule out  bronchiolitis obliterans, organized pneumonia.   RECOMMENDATIONS:  Follow up microbiology and pathology.      Charlcie Cradle Delford Field, MD, [facility]  Electronically Signed     PEW/MEDQ  D:  09/15/2008  T:  09/15/2008  Job:  [id number]   cc:   Hewitt Shorts, M.D.  Fax: [phone]

## 2010-11-28 NOTE — Op Note (Signed)
NAMEFLORINDA, Ann White                   ACCOUNT NO.:  [id number]   MEDICAL RECORD NO.:  [id number]          PATIENT TYPE:  OIB   LOCATION:  3172                         FACILITY:  [facility]   PHYSICIAN:  Hewitt Shorts, M.D.DATE OF BIRTH:  1946/02/28   DATE OF PROCEDURE:  08/25/2008  DATE OF DISCHARGE:                               OPERATIVE REPORT   PREOPERATIVE DIAGNOSIS:  Carpal tunnel syndrome.   POSTOPERATIVE DIAGNOSIS:  Carpal tunnel syndrome.   PROCEDURE:  Right carpal tunnel release.   SURGEON:  Hewitt Shorts, MD   ANESTHESIA:  Bier block with intravenous sedation.   INDICATIONS:  The patient is a 65 year old woman with bilateral carpal  tunnel syndrome, right worse than left as well as advanced degenerative  changes in the cervical spine.  The incision was made to proceed with a  right carpal tunnel release.   PROCEDURE:  The patient was brought to the operating room, a Bier block  was administered by the Anesthesia Service to the right upper extremity  and then the distal right upper extremity was prepped with Betadine soap  and solution, and draped in a sterile fashion.  An intravenous sedation  was administered as well and incision was made just medial to the thenar  crease extending from the distal carpal crease into the palm.  Dissection was carried down through the subcutaneous tissue to the  transverse carpal ligament that was carefully divided from distal to  proximal taking care to leave the underlying structures undisturbed.  The transverse carpal ligament was opened in its full extent, in the mid  portion of the ligament we could see notable thickening, which is felt  to be the point of compression.  This was decompressed and the ligament  fully divided.  Once this was completed, we proceeded with closure.  The  subcutaneous and subcuticular were closed with interrupted inverted 2-0  undyed Vicryl sutures and the skin was reapproximated with interrupted 4-  0 nylon horizontal mattress sutures.  Once skin edges were closed with  Adaptic and gauze fluffs were applied, and the dressing was wrapped with  a Kling.  The procedure was tolerated well.  The estimated blood loss  was nil.  Sponge and needle count were correct.  Following the surgery,  the patient's right upper extremity was placed on her abdomen. Marland Kitchen  She  was instructed to keep it elevated.  She was transferred to the recovery  room for further care to be subsequently discharged to home later today.      Hewitt Shorts, M.D.  Electronically Signed     RWN/MEDQ  D:  08/25/2008  T:  08/25/2008  Job:  [id number]

## 2011-07-17 HISTORY — PX: OTHER SURGICAL HISTORY: SHX169

## 2011-07-23 ENCOUNTER — Other Ambulatory Visit: Payer: Self-pay | Admitting: Family Medicine

## 2011-07-23 DIAGNOSIS — Z1231 Encounter for screening mammogram for malignant neoplasm of breast: Secondary | ICD-10-CM

## 2011-07-26 ENCOUNTER — Other Ambulatory Visit: Payer: Self-pay | Admitting: Family Medicine

## 2011-07-26 DIAGNOSIS — N951 Menopausal and female climacteric states: Secondary | ICD-10-CM

## 2011-07-26 LAB — COMPREHENSIVE METABOLIC PANEL
ALT: 16 U/L (ref 7–35)
AST: 19 U/L
Alkaline Phosphatase: 53 U/L
Creat: 1.19
Glucose: 94
Total Bilirubin: 0.2 mg/dL

## 2011-07-26 LAB — CBC
Hemoglobin: 11.5 g/dL — AB (ref 12.0–16.0)
WBC: 4.9
platelet count: 209

## 2011-08-16 ENCOUNTER — Ambulatory Visit
Admission: RE | Admit: 2011-08-16 | Discharge: 2011-08-16 | Disposition: A | Discharge status: Home / Self Care | Payer: BC Managed Care – PPO | Source: Ambulatory Visit | Attending: Family Medicine | Admitting: Family Medicine

## 2011-08-16 DIAGNOSIS — Z1231 Encounter for screening mammogram for malignant neoplasm of breast: Secondary | ICD-10-CM

## 2011-08-16 DIAGNOSIS — N951 Menopausal and female climacteric states: Secondary | ICD-10-CM

## 2011-08-23 ENCOUNTER — Other Ambulatory Visit: Payer: Self-pay | Admitting: Family Medicine

## 2011-08-23 DIAGNOSIS — R928 Other abnormal and inconclusive findings on diagnostic imaging of breast: Secondary | ICD-10-CM

## 2011-08-31 ENCOUNTER — Ambulatory Visit
Admission: RE | Admit: 2011-08-31 | Discharge: 2011-08-31 | Disposition: A | Discharge status: Home / Self Care | Payer: BC Managed Care – PPO | Source: Ambulatory Visit | Attending: Family Medicine | Admitting: Family Medicine

## 2011-08-31 ENCOUNTER — Other Ambulatory Visit: Payer: Self-pay | Admitting: Family Medicine

## 2011-08-31 DIAGNOSIS — R928 Other abnormal and inconclusive findings on diagnostic imaging of breast: Secondary | ICD-10-CM

## 2012-01-22 ENCOUNTER — Other Ambulatory Visit: Payer: Self-pay | Admitting: Family Medicine

## 2012-01-22 DIAGNOSIS — N63 Unspecified lump in unspecified breast: Secondary | ICD-10-CM

## 2012-02-27 ENCOUNTER — Ambulatory Visit
Admission: RE | Admit: 2012-02-27 | Discharge: 2012-02-27 | Disposition: A | Discharge status: Home / Self Care | Payer: BC Managed Care – PPO | Source: Ambulatory Visit | Attending: Family Medicine | Admitting: Family Medicine

## 2012-02-27 ENCOUNTER — Other Ambulatory Visit: Payer: Self-pay | Admitting: Family Medicine

## 2012-02-27 DIAGNOSIS — N63 Unspecified lump in unspecified breast: Secondary | ICD-10-CM

## 2012-03-05 ENCOUNTER — Ambulatory Visit
Admission: RE | Admit: 2012-03-05 | Discharge: 2012-03-05 | Disposition: A | Discharge status: Home / Self Care | Payer: BC Managed Care – PPO | Source: Ambulatory Visit | Attending: Family Medicine | Admitting: Family Medicine

## 2012-03-05 ENCOUNTER — Other Ambulatory Visit: Payer: Self-pay | Admitting: Family Medicine

## 2012-03-05 DIAGNOSIS — N63 Unspecified lump in unspecified breast: Secondary | ICD-10-CM

## 2012-03-05 HISTORY — PX: BREAST BIOPSY: SHX20

## 2012-06-10 ENCOUNTER — Ambulatory Visit (INDEPENDENT_AMBULATORY_CARE_PROVIDER_SITE_OTHER): Payer: BC Managed Care – PPO | Admitting: Family Medicine

## 2012-06-10 ENCOUNTER — Encounter: Payer: Self-pay | Admitting: Family Medicine

## 2012-06-10 VITALS — BP 140/82 | HR 62 | Temp 98.3°F | Ht 61.5 in | Wt 195.0 lb

## 2012-06-10 DIAGNOSIS — J309 Allergic rhinitis, unspecified: Secondary | ICD-10-CM

## 2012-06-10 DIAGNOSIS — F329 Major depressive disorder, single episode, unspecified: Secondary | ICD-10-CM

## 2012-06-10 DIAGNOSIS — K589 Irritable bowel syndrome without diarrhea: Secondary | ICD-10-CM

## 2012-06-10 DIAGNOSIS — E785 Hyperlipidemia, unspecified: Secondary | ICD-10-CM

## 2012-06-10 DIAGNOSIS — M129 Arthropathy, unspecified: Secondary | ICD-10-CM

## 2012-06-10 DIAGNOSIS — E7849 Other hyperlipidemia: Secondary | ICD-10-CM | POA: Insufficient documentation

## 2012-06-10 DIAGNOSIS — F331 Major depressive disorder, recurrent, moderate: Secondary | ICD-10-CM | POA: Insufficient documentation

## 2012-06-10 DIAGNOSIS — K219 Gastro-esophageal reflux disease without esophagitis: Secondary | ICD-10-CM

## 2012-06-10 DIAGNOSIS — F3289 Other specified depressive episodes: Secondary | ICD-10-CM

## 2012-06-10 DIAGNOSIS — I10 Essential (primary) hypertension: Secondary | ICD-10-CM

## 2012-06-10 DIAGNOSIS — F32A Depression, unspecified: Secondary | ICD-10-CM

## 2012-06-10 DIAGNOSIS — K581 Irritable bowel syndrome with constipation: Secondary | ICD-10-CM | POA: Insufficient documentation

## 2012-06-10 DIAGNOSIS — M1990 Unspecified osteoarthritis, unspecified site: Secondary | ICD-10-CM

## 2012-06-10 DIAGNOSIS — Z8719 Personal history of other diseases of the digestive system: Secondary | ICD-10-CM

## 2012-06-10 DIAGNOSIS — J302 Other seasonal allergic rhinitis: Secondary | ICD-10-CM

## 2012-06-10 MED ORDER — FLUTICASONE PROPIONATE 50 MCG/ACT NA SUSP: 2.0000 | Freq: Every day | NASAL | Status: DC

## 2012-06-10 MED ORDER — SERTRALINE HCL 100 MG PO TABS: 100.0000 mg | ORAL_TABLET | Freq: Every day | ORAL | Status: DC

## 2012-06-10 MED ORDER — LISINOPRIL-HYDROCHLOROTHIAZIDE 10-12.5 MG PO TABS: 1.0000 | ORAL_TABLET | Freq: Every day | ORAL | Status: DC

## 2012-06-10 NOTE — Assessment & Plan Note (Signed)
Chronic, stable on sertraline.  Continue.  Using klonopin regularly.

## 2012-06-10 NOTE — Progress Notes (Signed)
Subjective:    Patient ID: Ann White, female    DOB: 06-06-1946, 66 y.o.   MRN: [id number]  HPI CC: new pt to establish  Prior saw Dr. Benedetto Goad.  Saw eye doctor about 3 mo ago.  GERD - controlled on pantoparzole.  Did better on nexium but not covered by insurance HTN - usually well controlled on lisinopril hctz. HLD - h/o chol issues, stopped statin 2/2 issues.  Has been taking fish oil.  Prior on statin but caused joint and stomach pain (lipitor). Battles with constipation - takes stool softener regularly. Sinus congestion worse this year.  Taking some [employer] brand sinus medicine with decongestant.  Has never tried antihistamine.  ++sinus congestion.  Has been on flonase in past, did well with this.  Uses nasal saline as well.  Preventative: Last CPE was 2012. Well woman - with Dr. Benedetto Goad. Shingles shot 2012. Mammogram - 03/2012 s/p benign R breast biopsy, rec rpt 1 year  Colon cancer screening - thinks due for colonoscopy  Lives with husband and 2 cats.  2 grown children, 5 grandchildren. Occupation: retired, worked in [employer] Activity: no regular exercise.  Does walk in summer Diet: fruits/vegetables daily, good water.  Medications and allergies reviewed and updated in chart.  Past histories reviewed and updated if relevant as below. Patient Active Problem List  Diagnosis  . PULMONARY FIBROSIS, POSTINFLAMMATORY  . G E R D  . SPONDYLITIS  . DEGENERATIVE DISC DISEASE  . Irritable bowel syndrome with constipation  . HTN (hypertension)  . Seasonal allergic rhinitis  . GERD (gastroesophageal reflux disease)  . Depression  . Arthritis   Past Medical History  Diagnosis Date  . Irritable bowel syndrome with constipation   . Arthritis   . Depression   . GERD (gastroesophageal reflux disease)   . Seasonal allergic rhinitis   . HTN (hypertension)   . History of chicken pox    Past Surgical History  Procedure Date  . Total abdominal hysterectomy w/ bilateral  salpingoophorectomy 1990s    complete, dysmenorrhea and fibroids  . Cholecystectomy 1990s  . Carpal tunnel release 2004, 2013    bilateral  . Bladder tack 1990s  . Breast biopsy 2013    at breast center, benign   History  Substance Use Topics  . Smoking status: Former Games developer  . Smokeless tobacco: Never Used  . Alcohol Use: No   Family History  Problem Relation Age of Onset  . CAD Mother 65    MI  . Parkinson's disease Father   . Diabetes Father   . CAD Father 26    MI  . Hypertension Mother   . Cancer Neg Hx   . Stroke Neg Hx    Allergies  Allergen Reactions  . Contrast Media (Iodinated Diagnostic Agents) Other (See Comments)    Oral contrast caused mouth blisters  . Sulfa Antibiotics Swelling    Causes mouth to swell   Current Outpatient Prescriptions on File Prior to Visit  Medication Sig Dispense Refill  . clonazePAM (KLONOPIN) 1 MG tablet Take 1 mg by mouth 2 (two) times daily.       Marland Kitchen estradiol (CLIMARA - DOSED IN MG/24 HR) 0.075 mg/24hr Place 1 patch onto the skin once a week.      . gabapentin (NEURONTIN) 300 MG capsule Take one by mouth at bedtime      . lisinopril-hydrochlorothiazide (PRINZIDE,ZESTORETIC) 10-12.5 MG per tablet Take 1 tablet by mouth daily.  30 tablet  11  . pantoprazole (PROTONIX)  40 MG tablet Take 40 mg by mouth daily.      . sertraline (ZOLOFT) 100 MG tablet Take 1 tablet (100 mg total) by mouth daily.  30 tablet  11  . fluticasone (FLONASE) 50 MCG/ACT nasal spray Place 2 sprays into the nose daily.  16 g  3     Review of Systems  Constitutional: Negative for fever, chills, activity change, appetite change, fatigue and unexpected weight change.  HENT: Negative for hearing loss and neck pain.   Eyes: Negative for visual disturbance.  Respiratory: Positive for cough (seasonal). Negative for chest tightness, shortness of breath and wheezing.   Cardiovascular: Negative for chest pain, palpitations and leg swelling.  Gastrointestinal: Positive  for constipation. Negative for nausea, vomiting, abdominal pain, diarrhea, blood in stool and abdominal distention.  Genitourinary: Negative for hematuria and difficulty urinating.  Musculoskeletal: Negative for myalgias and arthralgias.  Skin: Negative for rash.  Neurological: Negative for dizziness, seizures, syncope and headaches.  Hematological: Does not bruise/bleed easily.  Psychiatric/Behavioral: Negative for dysphoric mood. The patient is not nervous/anxious.        Objective:   Physical Exam  Nursing note and vitals reviewed. Constitutional: She is oriented to person, place, and time. She appears well-developed and well-nourished. No distress.  HENT:  Head: Normocephalic and atraumatic.  Right Ear: Hearing, tympanic membrane, external ear and ear canal normal.  Left Ear: Hearing, tympanic membrane, external ear and ear canal normal.  Nose: Nose normal. No mucosal edema or rhinorrhea. Right sinus exhibits no maxillary sinus tenderness and no frontal sinus tenderness. Left sinus exhibits no maxillary sinus tenderness and no frontal sinus tenderness.  Mouth/Throat: Uvula is midline, oropharynx is clear and moist and mucous membranes are normal. No oropharyngeal exudate, posterior oropharyngeal edema, posterior oropharyngeal erythema or tonsillar abscesses.  Eyes: Conjunctivae normal and EOM are normal. Pupils are equal, round, and reactive to light. No scleral icterus.  Neck: Normal range of motion. Neck supple.  Cardiovascular: Normal rate, regular rhythm, normal heart sounds and intact distal pulses.   No murmur heard. Pulses:      Radial pulses are 2+ on the right side, and 2+ on the left side.  Pulmonary/Chest: Effort normal and breath sounds normal. No respiratory distress. She has no wheezes. She has no rales.  Abdominal: Soft. Bowel sounds are normal. She exhibits no distension and no mass. There is no tenderness. There is no rebound and no guarding.  Musculoskeletal: Normal  range of motion. She exhibits no edema.  Lymphadenopathy:    She has no cervical adenopathy.  Neurological: She is alert and oriented to person, place, and time.       CN grossly intact, station and gait intact  Skin: Skin is warm and dry. No rash noted.  Psychiatric: She has a normal mood and affect. Her behavior is normal. Judgment and thought content normal.       Assessment & Plan:

## 2012-06-10 NOTE — Assessment & Plan Note (Signed)
Chronic, adequate control today. Continue to monitor, recheck next visit.

## 2012-06-10 NOTE — Assessment & Plan Note (Signed)
Chronic, stable on PPI. Continue.

## 2012-06-10 NOTE — Assessment & Plan Note (Signed)
Predominantly congestion now with sinus congestion and headache. rec trial of flonase. Consider singulair. rec against decongestants.

## 2012-06-10 NOTE — Assessment & Plan Note (Signed)
Seems stable.  monitor

## 2012-06-10 NOTE — Patient Instructions (Addendum)
Try flonase for sinus congestion. Check with insurance about pneumonia shot and tetanus (pertussis) Tdap shot. Return in next few months fasting for blood work and afterwards for [employer] physical

## 2012-06-30 ENCOUNTER — Telehealth: Payer: Self-pay

## 2012-06-30 NOTE — Telephone Encounter (Signed)
Pt has billing question;advised to call [phone]. Pt voiced understanding.

## 2012-07-01 ENCOUNTER — Encounter: Payer: Self-pay | Admitting: Family Medicine

## 2012-07-14 ENCOUNTER — Encounter: Payer: Self-pay | Admitting: Gastroenterology

## 2012-07-21 ENCOUNTER — Ambulatory Visit (INDEPENDENT_AMBULATORY_CARE_PROVIDER_SITE_OTHER): Payer: Medicare Other | Admitting: Family Medicine

## 2012-07-21 ENCOUNTER — Telehealth: Payer: Self-pay | Admitting: Family Medicine

## 2012-07-21 ENCOUNTER — Encounter: Payer: Self-pay | Admitting: Family Medicine

## 2012-07-21 VITALS — BP 144/94 | HR 67 | Temp 98.1°F | Wt 195.2 lb

## 2012-07-21 DIAGNOSIS — J0100 Acute maxillary sinusitis, unspecified: Secondary | ICD-10-CM | POA: Insufficient documentation

## 2012-07-21 MED ORDER — AMOXICILLIN-POT CLAVULANATE 875-125 MG PO TABS: 1.0000 | ORAL_TABLET | Freq: Two times a day (BID) | ORAL | Status: AC

## 2012-07-21 NOTE — Telephone Encounter (Signed)
[facility] Triage Call Report Triage Record Num: [id number] Operator: Ignatius Specking Patient Name: Ann White Call Date & Time: 07/19/2012 8:22:12AM Patient Phone: [phone] PCP: Patient Gender: Female PCP Fax : Patient DOB: 02-28-1946 Practice Name:  [facility] Reason for Call: Caller: Shardae/Patient; PCP: Eustaquio [facility]); CB#: [phone]; Call regarding Sinus Pain; onset 2 days ago. Pt. has hx of sinus problems, and these sx. are consistent with previous sinus infections. Afebrile. Triaged per headache and all emergent sx. r/o per guidelines. Disposition: See Provider within 24 hours for: Facial pain, frontal headache, yellow green nasal drainage. Pt. is actually having blood in sinus drainage. Pt. was directed to UC or ED for treatement within 24 hours. Home care instructions given. Pt. will go to UC if Home care measures do not help, otherwise will make appt. to be seen in office on Monday. db/[facility]. Protocol(s) Used: Headache Recommended Outcome per Protocol: See Provider within 24 hours Reason for Outcome: Facial pain (fullness, pressure, worsens with bending over), frontal headache, yellow-green nasal discharge AND any temperature elevation Care Advice: ~ Use a cool mist humidifier to moisten air. Be sure to clean according to manufacturer's instructions. ~ Consider use of a saline nasal spray per package directions to help relieve nasal congestion. ~ SYMPTOM / CONDITION MANAGEMENT ~ CAUTIONS A warm, moist compress placed on face, over eyes for 15 to 20 minutes, 5 to 6 times a day, may help relieve the congestion. ~ Analgesic/Antipyretic Advice - NSAIDs: Consider aspirin, ibuprofen, naproxen or ketoprofen for pain or fever as directed on label or by pharmacist/provider. PRECAUTIONS: - If over 85 years of age, should not take longer than 1 week without consulting provider. EXCEPTIONS: - Should not be used if taking blood thinners or have bleeding  problems. - Do not use if have history of sensitivity/allergy to any of these medications; or history of cardiovascular, ulcer, kidney, liver disease or diabetes unless approved by provider. - Do not exceed recommended dose or frequency. ~ 07/19/2012 8:41:38AM Page 1 of 1 [id number]

## 2012-07-21 NOTE — Telephone Encounter (Signed)
Called and spoke with the manager (Donita) at [facility].  Made her aware of what the patient was incorrectly told about Sat. Clinic being closed for the holidays.  Donita said they do know that Saturday [facility] was open and she was not sure what happened.  She said she would follow up on this.

## 2012-07-21 NOTE — Telephone Encounter (Signed)
I saw pt today.  She states she called our after hours Sat morning and she was told our Saturday clinic was closed because of holidays so she had to wait until Monday to see me or go to ER. She should have been given option of Saturday clinic at [facility], can we check on [facility] protocol for this?  Below note does not mention offering Saturday clinic.

## 2012-07-21 NOTE — Telephone Encounter (Signed)
noted 

## 2012-07-21 NOTE — Patient Instructions (Addendum)
You have a sinus infection. Take medicine as prescribed: Augmentin twice daily for 10 days. Push fluids and plenty of rest. Nasal saline irrigation or neti pot to help drain sinuses. Hold off on flonase for now. May take ibuprofen for inflammation in sinuses. Avoid decongestants for blood pressure. May use simple mucinex with plenty of fluid to help mobilize mucous. Let us know if fever >101.5, trouble opening/closing mouth, difficulty swallowing, or worsening - you may need to be seen again.

## 2012-07-21 NOTE — Progress Notes (Signed)
  Subjective:    Patient ID: Ann White, female    DOB: 07/29/1945, 67 y.o.   MRN: [id number]  HPI CC: R facial pain  H/o chronic sinus congestion, last visit started on flonase and sxs improved.    5d h/o R sided facial pain, ear ache bilaterally, jaw ache, R pressure HA, mild PNDrainage.  Blowing nose with yellow and bloody mucous.  No fevers/chills, abd pain, nausea, coughing.  No tinnitus.  No drainage from ears.  So far has tried mucinex D, flonase, tramadol.    No sick contacts at home. No smokers at home.  Past Medical History  Diagnosis Date  . Irritable bowel syndrome with constipation   . Arthritis   . Depression   . GERD (gastroesophageal reflux disease)   . Seasonal allergic rhinitis   . HTN (hypertension)   . History of chicken pox   . History of diverticulitis of colon   . HLD (hyperlipidemia)   . Lichen sclerosus et atrophicus     Review of Systems Per HPI    Objective:   Physical Exam  Nursing note and vitals reviewed. Constitutional: She appears well-developed and well-nourished. No distress.  HENT:  Head: Normocephalic and atraumatic.  Right Ear: Hearing, tympanic membrane, external ear and ear canal normal.  Left Ear: Hearing, tympanic membrane, external ear and ear canal normal.  Nose: No mucosal edema or rhinorrhea. Right sinus exhibits maxillary sinus tenderness and frontal sinus tenderness. Left sinus exhibits no maxillary sinus tenderness and no frontal sinus tenderness.  Mouth/Throat: Uvula is midline, oropharynx is clear and moist and mucous membranes are normal. No oropharyngeal exudate, posterior oropharyngeal edema, posterior oropharyngeal erythema or tonsillar abscesses.  Eyes: Conjunctivae normal and EOM are normal. Pupils are equal, round, and reactive to light. No scleral icterus.  Neck: Normal range of motion. Neck supple.  Cardiovascular: Normal rate, regular rhythm, normal heart sounds and intact distal pulses.   No murmur  heard. Pulmonary/Chest: Effort normal and breath sounds normal. No respiratory distress. She has no wheezes. She has no rales.  Lymphadenopathy:    She has no cervical adenopathy.  Skin: Skin is warm and dry. No rash noted.       Assessment & Plan:

## 2012-07-21 NOTE — Assessment & Plan Note (Signed)
Given significant sxs and h/o chronic sinus congestion, will treat aggressively with augmentin to cover bacterial sinusitis. Discussed supportive care as per instructions. Red flags to return discussed.

## 2012-07-25 ENCOUNTER — Other Ambulatory Visit: Payer: Self-pay | Admitting: Family Medicine

## 2012-07-25 NOTE — Telephone Encounter (Signed)
Ok to refill 

## 2012-07-25 NOTE — Telephone Encounter (Signed)
plz phone in. 

## 2012-07-25 NOTE — Telephone Encounter (Signed)
Pt left note requesting clonazepam be called in ASAP pt is out of med. Medication phoned to [facility] pharmacy as instructed.Patient's husband notified as instructed by telephone that refill called in.

## 2012-08-11 ENCOUNTER — Ambulatory Visit (INDEPENDENT_AMBULATORY_CARE_PROVIDER_SITE_OTHER): Payer: Medicare Other | Admitting: Family Medicine

## 2012-08-11 ENCOUNTER — Encounter: Payer: Self-pay | Admitting: Family Medicine

## 2012-08-11 VITALS — BP 132/76 | HR 68 | Temp 98.2°F | Wt 194.8 lb

## 2012-08-11 DIAGNOSIS — J0100 Acute maxillary sinusitis, unspecified: Secondary | ICD-10-CM

## 2012-08-11 MED ORDER — PANTOPRAZOLE SODIUM 40 MG PO TBEC: 40.0000 mg | DELAYED_RELEASE_TABLET | Freq: Two times a day (BID) | ORAL | Status: DC

## 2012-08-11 NOTE — Progress Notes (Signed)
  Subjective:    Patient ID: Ann White, female    DOB: 1946-02-23, 67 y.o.   MRN: [id number]  HPI CC: f/u?  Comes in today - unclear why scheduled today.  Has medicare wellness visit scheduled for next month.  No questions or concerns for me today.  States insurance will cover TDap and pneumovax.  Has already received zostavax.  Seen here 07/21/2012 with dx maxillary sinusitis, treated with augmentin course.  Resolving well.   Review of Systems deferred    Objective:   Physical Exam deferred    Assessment & Plan:  No charge today - as likely our error in scheduling this visit.

## 2012-08-11 NOTE — Patient Instructions (Addendum)
Verify with pharmacy they have correct Sharen Hones - card provided today to give them. No charge today. Change labwork appointment to a few days prior to physical.

## 2012-08-13 ENCOUNTER — Other Ambulatory Visit: Payer: Self-pay | Admitting: Family Medicine

## 2012-08-13 DIAGNOSIS — I10 Essential (primary) hypertension: Secondary | ICD-10-CM

## 2012-08-13 DIAGNOSIS — D649 Anemia, unspecified: Secondary | ICD-10-CM

## 2012-08-13 DIAGNOSIS — E785 Hyperlipidemia, unspecified: Secondary | ICD-10-CM

## 2012-08-20 ENCOUNTER — Other Ambulatory Visit (INDEPENDENT_AMBULATORY_CARE_PROVIDER_SITE_OTHER): Payer: Medicare Other

## 2012-08-20 DIAGNOSIS — I10 Essential (primary) hypertension: Secondary | ICD-10-CM

## 2012-08-20 DIAGNOSIS — E785 Hyperlipidemia, unspecified: Secondary | ICD-10-CM

## 2012-08-20 DIAGNOSIS — D649 Anemia, unspecified: Secondary | ICD-10-CM

## 2012-08-20 LAB — CBC WITH DIFFERENTIAL/PLATELET
Basophils Absolute: 0 10*3/uL (ref 0.0–0.1)
Basophils Relative: 0.7 % (ref 0.0–3.0)
Eosinophils Absolute: 0.2 10*3/uL (ref 0.0–0.7)
Eosinophils Relative: 3.9 % (ref 0.0–5.0)
HCT: 32.9 % — ABNORMAL LOW (ref 36.0–46.0)
Hemoglobin: 11.3 g/dL — ABNORMAL LOW (ref 12.0–15.0)
Lymphocytes Relative: 38.4 % (ref 12.0–46.0)
Lymphs Abs: 2 10*3/uL (ref 0.7–4.0)
MCHC: 34.3 g/dL (ref 30.0–36.0)
MCV: 87.3 fl (ref 78.0–100.0)
Monocytes Absolute: 0.3 10*3/uL (ref 0.1–1.0)
Monocytes Relative: 5 % (ref 3.0–12.0)
Neutro Abs: 2.8 10*3/uL (ref 1.4–7.7)
Neutrophils Relative %: 52 % (ref 43.0–77.0)
Platelets: 192 10*3/uL (ref 150.0–400.0)
RBC: 3.77 Mil/uL — ABNORMAL LOW (ref 3.87–5.11)
RDW: 14 % (ref 11.5–14.6)
WBC: 5.3 10*3/uL (ref 4.5–10.5)

## 2012-08-20 LAB — BASIC METABOLIC PANEL
BUN: 28 mg/dL — ABNORMAL HIGH (ref 6–23)
CO2: 27 mEq/L (ref 19–32)
Calcium: 9.3 mg/dL (ref 8.4–10.5)
Chloride: 103 mEq/L (ref 96–112)
Creatinine, Ser: 1.2 mg/dL (ref 0.4–1.2)
GFR: 49.29 mL/min — ABNORMAL LOW (ref >60.00)
Glucose, Bld: 103 mg/dL — ABNORMAL HIGH (ref 70–99)
Potassium: 4.3 mEq/L (ref 3.5–5.1)
Sodium: 137 mEq/L (ref 135–145)

## 2012-08-20 LAB — LDL CHOLESTEROL, DIRECT: Direct LDL: 176.5 mg/dL

## 2012-08-20 LAB — LIPID PANEL
Cholesterol: 297 mg/dL — ABNORMAL HIGH (ref 0–200)
HDL: 40.8 mg/dL (ref >39.00)
Total CHOL/HDL Ratio: 7
Triglycerides: 311 mg/dL — ABNORMAL HIGH (ref 0.0–149.0)
VLDL: 62.2 mg/dL — ABNORMAL HIGH (ref 0.0–40.0)

## 2012-08-20 LAB — TSH: TSH: 5.04 u[IU]/mL (ref 0.35–5.50)

## 2012-08-27 ENCOUNTER — Encounter: Payer: Self-pay | Admitting: Family Medicine

## 2012-08-27 ENCOUNTER — Other Ambulatory Visit: Payer: BC Managed Care – PPO

## 2012-08-27 ENCOUNTER — Ambulatory Visit (INDEPENDENT_AMBULATORY_CARE_PROVIDER_SITE_OTHER): Payer: Medicare Other | Admitting: Family Medicine

## 2012-08-27 VITALS — BP 124/76 | HR 60 | Temp 97.9°F | Ht 61.75 in | Wt 193.2 lb

## 2012-08-27 DIAGNOSIS — R7303 Prediabetes: Secondary | ICD-10-CM | POA: Insufficient documentation

## 2012-08-27 DIAGNOSIS — R739 Hyperglycemia, unspecified: Secondary | ICD-10-CM

## 2012-08-27 DIAGNOSIS — R7309 Other abnormal glucose: Secondary | ICD-10-CM

## 2012-08-27 DIAGNOSIS — L940 Localized scleroderma [morphea]: Secondary | ICD-10-CM

## 2012-08-27 DIAGNOSIS — E785 Hyperlipidemia, unspecified: Secondary | ICD-10-CM

## 2012-08-27 DIAGNOSIS — F32A Depression, unspecified: Secondary | ICD-10-CM

## 2012-08-27 DIAGNOSIS — Z23 Encounter for immunization: Secondary | ICD-10-CM

## 2012-08-27 DIAGNOSIS — Z0000 Encounter for general adult medical examination without abnormal findings: Secondary | ICD-10-CM | POA: Insufficient documentation

## 2012-08-27 DIAGNOSIS — F329 Major depressive disorder, single episode, unspecified: Secondary | ICD-10-CM

## 2012-08-27 DIAGNOSIS — I10 Essential (primary) hypertension: Secondary | ICD-10-CM

## 2012-08-27 DIAGNOSIS — Z139 Encounter for screening, unspecified: Secondary | ICD-10-CM

## 2012-08-27 DIAGNOSIS — L900 Lichen sclerosus et atrophicus: Secondary | ICD-10-CM | POA: Insufficient documentation

## 2012-08-27 MED ORDER — PRAVASTATIN SODIUM 40 MG PO TABS: 40.0000 mg | ORAL_TABLET | Freq: Every day | ORAL | Status: DC

## 2012-08-27 MED ORDER — ESTRADIOL 0.05 MG/24HR TD PTWK: 1.0000 | MEDICATED_PATCH | TRANSDERMAL | Status: DC

## 2012-08-27 NOTE — Progress Notes (Signed)
Subjective:    Patient ID: Ann White, female    DOB: January 13, 1946, 67 y.o.   MRN: [id number]  HPI CC: welcome to medicare visit  To get EKG today. Minimal smoking hx.  Known bone spurs in spine that cause right leg and arm pain and neck pain.  Takes tylenol prn pain for this. On estrogen since hysterectomy 1990s.  HLD - intolerant to lipitor 2/2 pains -abdomen and muscles.   H/o lichen sclerosis - states not bothering her currently - controlled on topical creams.  Denies falls in last year Denies depression, anhedonia.  Vision screen - passed Hearing screen - failed on right.  Denies trouble with hearing.  No tinnitus.  Preventative:  Last CPE was 2012.  Shingles shot 2012.  Flu shot 04/2012 Pneumovax - today Tetanus - Tdap today Mammogram - 03/2012 s/p benign R breast biopsy, rec rpt 1 year  Colon cancer screening - thinks due for colonoscopy. Last had 10 yrs ago.  Would like colonoscopy scheduled later this year - has Dr. Ardell Isaacs contact info.  To contact us if needs assistance scheduling. Well woman - with Dr. Benedetto Goad. S/p hysterectomy for benign reason.  1990s. Advanced directives: requests information on this.  Does not have one set up.  Lives with husband and 2 cats. 2 grown children, 5 grandchildren.  Occupation: retired, worked in [employer]  Activity: no regular exercise. Does walk in summer  Diet: fruits/vegetables daily, good water.  Medications and allergies reviewed and updated in chart.  Past histories reviewed and updated if relevant as below. Patient Active Problem List  Diagnosis  . PULMONARY FIBROSIS, POSTINFLAMMATORY  . SPONDYLITIS  . DEGENERATIVE DISC DISEASE  . Irritable bowel syndrome with constipation  . HTN (hypertension)  . Seasonal allergic rhinitis  . GERD (gastroesophageal reflux disease)  . Depression  . History of diverticulitis of colon  . HLD (hyperlipidemia)  . Maxillary sinusitis, acute   Past Medical History  Diagnosis Date  .  Irritable bowel syndrome with constipation   . Arthritis   . Depression   . GERD (gastroesophageal reflux disease)   . Seasonal allergic rhinitis   . HTN (hypertension)   . History of chicken pox   . History of diverticulitis of colon   . HLD (hyperlipidemia)   . Lichen sclerosus et atrophicus   . Depression with anxiety    Past Surgical History  Procedure Laterality Date  . Total abdominal hysterectomy w/ bilateral salpingoophorectomy  1990s    complete, dysmenorrhea and fibroids  . Cholecystectomy  1990s  . Carpal tunnel release  2004, 2013    bilateral  . Bladder tack  1990s  . Breast biopsy  2013    at [facility] center, benign  . Dexa  07/2011    normal, T score -0.3  . Colonoscopy  2002  . Esophagogastroduodenoscopy endoscopy  2004    normal Russella Dar)   History  Substance Use Topics  . Smoking status: Never Smoker   . Smokeless tobacco: Never Used     Comment: minimal smoking history  . Alcohol Use: No   Family History  Problem Relation Age of Onset  . CAD Mother 36    MI  . Parkinson's disease Father   . Diabetes Father   . CAD Father 44    MI  . Hypertension Mother   . Cancer Neg Hx   . Stroke Neg Hx    Allergies  Allergen Reactions  . Contrast Media (Iodinated Diagnostic Agents) Other (See Comments)  Oral contrast caused mouth blisters  . Sulfa Antibiotics Swelling    Causes mouth to swell   Current Outpatient Prescriptions on File Prior to Visit  Medication Sig Dispense Refill  . Acetaminophen (ARTHRITIS PAIN RELIEF PO) Take by mouth.      . clonazePAM (KLONOPIN) 1 MG tablet TAKE 1 TABLET TWICE A DAY  60 tablet  3  . Docusate Calcium (STOOL SOFTENER PO) Take by mouth daily.       Marland Kitchen estradiol (CLIMARA - DOSED IN MG/24 HR) 0.075 mg/24hr Place 1 patch onto the skin once a week.      . fluticasone (FLONASE) 50 MCG/ACT nasal spray Place 2 sprays into the nose daily.  16 g  3  . gabapentin (NEURONTIN) 300 MG capsule TAKE ONE CAPSULE BY MOUTH AT BEDTIME  30  capsule  5  . lisinopril-hydrochlorothiazide (PRINZIDE,ZESTORETIC) 10-12.5 MG per tablet Take 1 tablet by mouth daily.  30 tablet  11  . MAGNESIUM CITRATE PO Take by mouth.      . Misc Natural Products (SINUS FORMULA PO) Take by mouth.      . Multiple Vitamin (MULTIVITAMIN) tablet Take 1 tablet by mouth daily.      . Omega-3 Fatty Acids (FISH OIL) 1000 MG CAPS Take one by mouth two times a day      . pantoprazole (PROTONIX) 40 MG tablet Take 1 tablet (40 mg total) by mouth 2 (two) times daily.  60 tablet  11  . sertraline (ZOLOFT) 100 MG tablet Take 1 tablet (100 mg total) by mouth daily.  30 tablet  11  . traMADol (ULTRAM) 50 MG tablet Take one by mouth daily and as needed       No current facility-administered medications on file prior to visit.    Review of Systems  Constitutional: Negative for fever, chills, activity change, appetite change, fatigue and unexpected weight change.  HENT: Negative for hearing loss and neck pain.   Eyes: Negative for visual disturbance.  Respiratory: Positive for cough (seasonal). Negative for chest tightness, shortness of breath and wheezing.   Cardiovascular: Negative for chest pain, palpitations and leg swelling.  Gastrointestinal: Positive for constipation. Negative for nausea, vomiting, abdominal pain, diarrhea, blood in stool and abdominal distention.  Genitourinary: Negative for hematuria and difficulty urinating.  Musculoskeletal: Negative for myalgias and arthralgias.  Skin: Negative for rash.  Neurological: Negative for dizziness, seizures, syncope and headaches.  Hematological: Does not bruise/bleed easily.  Psychiatric/Behavioral: Negative for dysphoric mood. The patient is not nervous/anxious.        Objective:   Physical Exam  Nursing note and vitals reviewed. Constitutional: She is oriented to person, place, and time. She appears well-developed and well-nourished. No distress.  HENT:  Head: Normocephalic and atraumatic.  Right Ear:  Hearing, tympanic membrane, external ear and ear canal normal.  Left Ear: Hearing, tympanic membrane, external ear and ear canal normal.  Nose: Nose normal.  Mouth/Throat: Oropharynx is clear and moist. No oropharyngeal exudate.  Eyes: Conjunctivae and EOM are normal. Pupils are equal, round, and reactive to light. No scleral icterus.  Neck: Normal range of motion. Neck supple. Carotid bruit is not present. No thyromegaly present.  Cardiovascular: Normal rate, regular rhythm, normal heart sounds and intact distal pulses.   No murmur heard. Pulses:      Radial pulses are 2+ on the right side, and 2+ on the left side.  Pulmonary/Chest: Effort normal and breath sounds normal. No respiratory distress. She has no wheezes. She has no  rales. Right breast exhibits no inverted nipple, no mass, no nipple discharge, no skin change and no tenderness. Left breast exhibits no inverted nipple, no mass, no nipple discharge, no skin change and no tenderness. Breasts are symmetrical.  Abdominal: Soft. Bowel sounds are normal. She exhibits no distension and no mass. There is no tenderness. There is no rebound and no guarding.  Genitourinary:  deferred  Musculoskeletal: Normal range of motion. She exhibits no edema.  Lymphadenopathy:    She has no cervical adenopathy.    She has no axillary adenopathy.       Right axillary: No lateral adenopathy present.       Left axillary: No lateral adenopathy present.      Right: No supraclavicular adenopathy present.       Left: No supraclavicular adenopathy present.  Neurological: She is alert and oriented to person, place, and time.  CN grossly intact, station and gait intact  Skin: Skin is warm and dry. No rash noted.  Psychiatric: She has a normal mood and affect. Her behavior is normal. Judgment and thought content normal.       Assessment & Plan:

## 2012-08-27 NOTE — Assessment & Plan Note (Signed)
Avoid added sugars.

## 2012-08-27 NOTE — Assessment & Plan Note (Signed)
Per pt currently stable.  Consider yearly pelvic exams to monitor.

## 2012-08-27 NOTE — Assessment & Plan Note (Signed)
Reviewed #s - will start low potency statin hopeful for better tolerance. Discussed use of CoQ10.

## 2012-08-27 NOTE — Assessment & Plan Note (Addendum)
I have personally reviewed the Medicare Annual Wellness questionnaire and have noted 1. The patient's medical and social history 2. Their use of alcohol, tobacco or illicit drugs 3. Their current medications and supplements 4. The patient's functional ability including ADL's, fall risks, home safety risks and hearing or visual impairment. 5. Diet and physical activity 6. Evidence for depression or mood disorders The patients weight, height, BMI have been recorded in the chart.  Hearing and vision has been addressed. I have made referrals, counseling and provided education to the patient based review of the above and I have provided the pt with a written personalized care plan for preventive services. See scanned questionairre. Advanced directives discussed: packet of info provided today.  Reviewed preventative protocols and updated unless pt declined. Pt to schedule her colonoscopy later this year. Pneumovax and Tdap today. EKG today - sinus brady at rate 50s, normal axis, intervals, no hypertrophy or acute ST/T change.

## 2012-08-27 NOTE — Assessment & Plan Note (Signed)
Chronic, stable. Continue meds. 

## 2012-08-27 NOTE — Patient Instructions (Signed)
Watch added sugars. Ensure staying well hydrated - avoid anti inflammatories like ibuprofen, aleve, advil, motrin.  Tylenol is ok. Let's try pravastatin at 40mg  daily.  Consider CoQ10 if you're noticing muscle pains. EKG today. Advanced directive packet provided today. Good to see you today, call us with questions. Let's try lower dose of estrogen patch. Return as needed or in 6 months for follow up

## 2012-08-27 NOTE — Assessment & Plan Note (Signed)
Continue sertraline and klonopin for anxiety associated with depression.

## 2012-10-13 ENCOUNTER — Telehealth: Payer: Self-pay

## 2012-10-13 MED ORDER — COENZYME Q10 100 MG PO CAPS: 100.0000 mg | ORAL_CAPSULE | Freq: Every day | ORAL | Status: DC

## 2012-10-13 MED ORDER — PRAVASTATIN SODIUM 20 MG PO TABS: 20.0000 mg | ORAL_TABLET | Freq: Every day | ORAL | Status: DC

## 2012-10-13 NOTE — Telephone Encounter (Addendum)
Let's decrease pravastatin to 20mg  daily (1/2 tablet of dose at home, new tablet will be 20mg  so doesn't need to split). I'd also like her to start coQ10 (OTC muscle health supplement) 100mg  daily to see if any improvement. To update me after 1-2 mo of above - if continued muscle aches, will change med again.

## 2012-10-13 NOTE — Telephone Encounter (Signed)
Pt left v/m has tried Pravastatin for one month and pt stopped due to leg and joint pain. Pt request another generic med for cholesterol sent to [facility].Please advise.pt request call back.

## 2012-10-14 NOTE — Telephone Encounter (Signed)
Patient notified as instructed by telephone. 

## 2012-11-10 ENCOUNTER — Ambulatory Visit (INDEPENDENT_AMBULATORY_CARE_PROVIDER_SITE_OTHER): Payer: Medicare Other | Admitting: Family Medicine

## 2012-11-10 ENCOUNTER — Encounter: Payer: Self-pay | Admitting: Family Medicine

## 2012-11-10 VITALS — BP 124/68 | HR 81 | Temp 98.2°F | Wt 195.2 lb

## 2012-11-10 DIAGNOSIS — N390 Urinary tract infection, site not specified: Secondary | ICD-10-CM

## 2012-11-10 DIAGNOSIS — R300 Dysuria: Secondary | ICD-10-CM

## 2012-11-10 LAB — POCT URINALYSIS DIPSTICK
Bilirubin, UA: NEGATIVE
Glucose, UA: NEGATIVE
Ketones, UA: NEGATIVE
Nitrite, UA: NEGATIVE
Protein, UA: NEGATIVE
Spec Grav, UA: 1.015
Urobilinogen, UA: NEGATIVE
pH, UA: 6.5

## 2012-11-10 MED ORDER — FLUCONAZOLE 150 MG PO TABS: 150.0000 mg | ORAL_TABLET | Freq: Once | ORAL | Status: DC

## 2012-11-10 MED ORDER — CIPROFLOXACIN HCL 250 MG PO TABS: 250.0000 mg | ORAL_TABLET | Freq: Two times a day (BID) | ORAL | Status: DC

## 2012-11-10 NOTE — Patient Instructions (Addendum)
Drink plenty of water and start the antibiotics today.  We'll contact you with your lab report.  Take care.   If you get a yeast infection, then start the diflucan.

## 2012-11-10 NOTE — Progress Notes (Signed)
Dysuria: yes duration of symptoms: about 6 days abdominal pain: only suprapubic, intermittently fevers:no back pain: lower back pain noted vomiting:no Sulfa allergy noted.   Azo only helped temporarily.    Meds, vitals, and allergies reviewed.   ROS: See HPI.  Otherwise negative.    GEN: nad, alert and oriented HEENT: mucous membranes moist NECK: supple CV: rrr.  PULM: ctab, no inc wob ABD: soft, +bs, suprapubic area not tender EXT: no edema SKIN: no acute rash BACK: no CVA pain

## 2012-11-11 DIAGNOSIS — N390 Urinary tract infection, site not specified: Secondary | ICD-10-CM | POA: Insufficient documentation

## 2012-11-11 NOTE — Assessment & Plan Note (Signed)
Nontoxic, ucx, cipro, fluids, fu prn.  She agrees.

## 2012-11-12 LAB — URINE CULTURE: Colony Count: 50000

## 2012-11-28 ENCOUNTER — Telehealth: Payer: Self-pay

## 2012-11-28 NOTE — Telephone Encounter (Signed)
Pt left v/m with billing issue; pt spoke with billing dept and was told pt has balance $ 10.18 for copays on 08/10/12 and 11/10/12; pt said she has proof with debit card co pays were made. Pt request call back.

## 2012-12-01 NOTE — Telephone Encounter (Signed)
Patient billing problem solved.  Patient understands she has a $0.00 balance.  [employer] had made a posting error that made the patient have a bill.

## 2012-12-22 ENCOUNTER — Other Ambulatory Visit: Payer: Self-pay | Admitting: Family Medicine

## 2012-12-22 ENCOUNTER — Telehealth: Payer: Self-pay

## 2012-12-22 NOTE — Telephone Encounter (Signed)
OK to refill

## 2012-12-22 NOTE — Telephone Encounter (Signed)
Pt left v/m that pt stopped pravastatin due to bilateral leg pain.pt said legs have stopped hurting since stopped pravastatin. Pt presently taking fish oil 1500 mg twice a day. Pt said she is having hot sweats especially at night with hot flashes during the day and request increase in dosage for estriadol 0.05 mg. [facility] [address].pt request cb.

## 2012-12-22 NOTE — Telephone Encounter (Signed)
plz phone in. 

## 2012-12-22 NOTE — Telephone Encounter (Signed)
Rx called in as directed.   

## 2012-12-23 ENCOUNTER — Other Ambulatory Visit: Payer: Self-pay | Admitting: *Deleted

## 2012-12-23 MED ORDER — ESTRADIOL 0.075 MG/24HR TD PTWK: 1.0000 | MEDICATED_PATCH | TRANSDERMAL | Status: DC

## 2012-12-23 MED ORDER — RED YEAST RICE 600 MG PO TABS: 600.0000 mg | ORAL_TABLET | Freq: Two times a day (BID) | ORAL | Status: DC

## 2012-12-23 NOTE — Telephone Encounter (Signed)
Patient notified. She was taking CoQ10 with pravastatin.

## 2012-12-23 NOTE — Telephone Encounter (Signed)
Plz ensure this has been phoned in (requested yesterday)

## 2012-12-23 NOTE — Telephone Encounter (Signed)
Rx called in as directed.   

## 2012-12-23 NOTE — Telephone Encounter (Signed)
Noted. I have sent in higher dose of estrogen patch. I'd also suggest she start red yeast rice supplement for cholesterol levels one pill daily and if tolerated increase to two pills daily.  Was she taking co enzyme Q10 daily with pravastatin? Lab Results  Component Value Date   CHOL 297* 08/20/2012   HDL 40.80 08/20/2012   LDLDIRECT 176.5 08/20/2012   TRIG 311.0* 08/20/2012   CHOLHDL 7 08/20/2012

## 2012-12-23 NOTE — Telephone Encounter (Signed)
Ok to refill 

## 2013-01-29 ENCOUNTER — Other Ambulatory Visit: Payer: Self-pay | Admitting: Family Medicine

## 2013-01-29 NOTE — Telephone Encounter (Signed)
OK to refill

## 2013-02-16 ENCOUNTER — Other Ambulatory Visit: Payer: Self-pay | Admitting: Family Medicine

## 2013-02-23 ENCOUNTER — Encounter: Payer: Self-pay | Admitting: Radiology

## 2013-02-24 ENCOUNTER — Ambulatory Visit (INDEPENDENT_AMBULATORY_CARE_PROVIDER_SITE_OTHER): Payer: Medicare Other | Admitting: Family Medicine

## 2013-02-24 ENCOUNTER — Encounter: Payer: Self-pay | Admitting: Family Medicine

## 2013-02-24 VITALS — BP 124/72 | HR 53 | Temp 98.2°F | Wt 189.8 lb

## 2013-02-24 DIAGNOSIS — I10 Essential (primary) hypertension: Secondary | ICD-10-CM

## 2013-02-24 DIAGNOSIS — E669 Obesity, unspecified: Secondary | ICD-10-CM

## 2013-02-24 DIAGNOSIS — N951 Menopausal and female climacteric states: Secondary | ICD-10-CM

## 2013-02-24 DIAGNOSIS — J309 Allergic rhinitis, unspecified: Secondary | ICD-10-CM

## 2013-02-24 DIAGNOSIS — E663 Overweight: Secondary | ICD-10-CM | POA: Insufficient documentation

## 2013-02-24 DIAGNOSIS — M4690 Unspecified inflammatory spondylopathy, site unspecified: Secondary | ICD-10-CM

## 2013-02-24 DIAGNOSIS — K219 Gastro-esophageal reflux disease without esophagitis: Secondary | ICD-10-CM

## 2013-02-24 DIAGNOSIS — J302 Other seasonal allergic rhinitis: Secondary | ICD-10-CM

## 2013-02-24 DIAGNOSIS — E785 Hyperlipidemia, unspecified: Secondary | ICD-10-CM

## 2013-02-24 NOTE — Progress Notes (Signed)
  Subjective:    Patient ID: Ann White, female    DOB: 1946-01-02, 67 y.o.   MRN: [id number]  HPI CC: 6 mo f/u  HLD - intolerant of lipitor and pravastatin even on coq10.  We started red yeast rice - taking 600mg  bid.  Tolerating better then prior statin trials.  No myalgias.  Postmenpausal hot flashes - trial of lower estrogen patch dose caused worsening hot flashes.    HTN - No HA, vision changes, CP/tightness, SOB, leg swelling.  States HR tends to run low.  GERD - on protonix 40mg  daily.  Second dose if needed.  Having tenderness in right ear.  + sinus congestion.  Denies hearing loss or ringing in ears.  Due for colon cancer screening - to schedule with Dr. Russella Dar in future - hopes for later this year.  Wt Readings from Last 3 Encounters:  02/24/13 189 lb 12 oz (86.07 kg)  11/10/12 195 lb 4 oz (88.565 kg)  08/27/12 193 lb 4 oz (87.658 kg)  working on weight loss - exercising, decreasing breads, no fried foods.  Past Medical History  Diagnosis Date  . Irritable bowel syndrome with constipation   . Arthritis   . Depression   . GERD (gastroesophageal reflux disease)   . Seasonal allergic rhinitis   . HTN (hypertension)   . History of chicken pox   . History of diverticulitis of colon   . HLD (hyperlipidemia)   . Lichen sclerosus et atrophicus     Past Surgical History  Procedure Laterality Date  . Total abdominal hysterectomy w/ bilateral salpingoophorectomy  1990s    complete, dysmenorrhea and fibroids  . Cholecystectomy  1990s  . Carpal tunnel release  2004, 2013    bilateral  . Bladder tack  1990s  . Breast biopsy  2013    at breast center, benign  . Dexa  07/2011    normal, T score -0.3  . Colonoscopy  2002  . Esophagogastroduodenoscopy endoscopy  2004    normal Russella Dar)   Review of Systems Per HPI    Objective:   Physical Exam  Nursing note and vitals reviewed. Constitutional: She appears well-developed and well-nourished. No distress.  HENT:  Head:  Normocephalic and atraumatic.  Right Ear: Hearing, tympanic membrane, external ear and ear canal normal.  Left Ear: Hearing, tympanic membrane, external ear and ear canal normal.  Nose: No mucosal edema or rhinorrhea. Right sinus exhibits no maxillary sinus tenderness and no frontal sinus tenderness. Left sinus exhibits no maxillary sinus tenderness and no frontal sinus tenderness.  Mouth/Throat: Uvula is midline, oropharynx is clear and moist and mucous membranes are normal. No oropharyngeal exudate, posterior oropharyngeal edema, posterior oropharyngeal erythema or tonsillar abscesses.  No pain with movement at TMJ  Eyes: Conjunctivae and EOM are normal. Pupils are equal, round, and reactive to light. No scleral icterus.  Neck: Normal range of motion. Neck supple.  Cardiovascular: Normal rate, regular rhythm, normal heart sounds and intact distal pulses.   No murmur heard. Pulmonary/Chest: Effort normal and breath sounds normal. No respiratory distress. She has no wheezes. She has no rales.  Musculoskeletal: She exhibits no edema.  Lymphadenopathy:    She has no cervical adenopathy.  Skin: Skin is warm and dry. No rash noted.  Psychiatric: She has a normal mood and affect.       Assessment & Plan:

## 2013-02-24 NOTE — Patient Instructions (Signed)
Return in 2-3 months fasting for lab visit blood work to check cholesterol levels Start flonase and nasal saline irrigation - daily to see if will help with earache and sinus congestion. Good to see you today, call us with qeustions Return in 6 months for medicare wellness visit

## 2013-02-24 NOTE — Assessment & Plan Note (Signed)
Intolerant of lipitor/pravastatin in the past.  Tolerating RYR well, along with CoQ10.

## 2013-02-24 NOTE — Assessment & Plan Note (Signed)
Exam stable today, however anticipate R earache coming from some sinus congestion/ETD.  Recommended regular flonase and nasal saline irrigation.

## 2013-02-24 NOTE — Assessment & Plan Note (Signed)
Chronic, stable. Continue meds. 

## 2013-02-24 NOTE — Assessment & Plan Note (Signed)
Stable on qdaily PPI dosing.  Takes 2nd dose prn.

## 2013-02-24 NOTE — Assessment & Plan Note (Signed)
Failed estrogen taper.  On SSRI. Continue climara 0.075mg  patch weekly.

## 2013-02-24 NOTE — Assessment & Plan Note (Addendum)
Body mass index is 35.01 kg/(m^2).  Working on weight loss through healthy diet choices and increased exercise in the form of walking.  Encouraged and congratulated on efforts to date.

## 2013-02-24 NOTE — Assessment & Plan Note (Signed)
tramadol PRN

## 2013-03-09 ENCOUNTER — Encounter: Payer: Self-pay | Admitting: Family Medicine

## 2013-03-11 ENCOUNTER — Other Ambulatory Visit: Payer: Self-pay | Admitting: Family Medicine

## 2013-03-11 NOTE — Telephone Encounter (Signed)
plz phone in. 

## 2013-03-11 NOTE — Telephone Encounter (Signed)
Rx called in as directed.   

## 2013-04-06 ENCOUNTER — Encounter: Payer: Self-pay | Admitting: Gastroenterology

## 2013-04-27 ENCOUNTER — Other Ambulatory Visit: Payer: Self-pay | Admitting: Neurosurgery

## 2013-04-27 DIAGNOSIS — M5416 Radiculopathy, lumbar region: Secondary | ICD-10-CM

## 2013-04-27 DIAGNOSIS — M5412 Radiculopathy, cervical region: Secondary | ICD-10-CM

## 2013-05-04 ENCOUNTER — Ambulatory Visit
Admission: RE | Admit: 2013-05-04 | Discharge: 2013-05-04 | Disposition: A | Discharge status: Home / Self Care | Payer: Medicare Other | Source: Ambulatory Visit | Attending: Neurosurgery | Admitting: Neurosurgery

## 2013-05-04 DIAGNOSIS — M5416 Radiculopathy, lumbar region: Secondary | ICD-10-CM

## 2013-05-04 DIAGNOSIS — M5412 Radiculopathy, cervical region: Secondary | ICD-10-CM

## 2013-05-08 ENCOUNTER — Other Ambulatory Visit: Payer: Self-pay | Admitting: Family Medicine

## 2013-05-09 ENCOUNTER — Other Ambulatory Visit: Payer: Self-pay | Admitting: Neurosurgery

## 2013-05-09 DIAGNOSIS — M5412 Radiculopathy, cervical region: Secondary | ICD-10-CM

## 2013-05-09 NOTE — Telephone Encounter (Signed)
plz phone in. 

## 2013-05-11 NOTE — Telephone Encounter (Signed)
Rx called in as directed.   

## 2013-05-14 ENCOUNTER — Ambulatory Visit
Admission: RE | Admit: 2013-05-14 | Discharge: 2013-05-14 | Disposition: A | Discharge status: Home / Self Care | Payer: Medicare Other | Source: Ambulatory Visit | Attending: Neurosurgery | Admitting: Neurosurgery

## 2013-05-14 DIAGNOSIS — M5412 Radiculopathy, cervical region: Secondary | ICD-10-CM

## 2013-05-14 MED ORDER — TRIAMCINOLONE ACETONIDE 40 MG/ML IJ SUSP (RADIOLOGY)
60.0000 mg | Freq: Once | INTRAMUSCULAR | Status: AC
  Administered 2013-05-14: 60 mg via EPIDURAL

## 2013-05-14 MED ORDER — IOHEXOL 300 MG/ML  SOLN
1.0000 mL | Freq: Once | INTRAMUSCULAR | Status: AC | PRN
  Administered 2013-05-14: 1 mL via EPIDURAL

## 2013-05-14 NOTE — Progress Notes (Signed)
Pt c/o headache. Crackers and soda given, po's taken well.

## 2013-05-21 ENCOUNTER — Other Ambulatory Visit (INDEPENDENT_AMBULATORY_CARE_PROVIDER_SITE_OTHER): Payer: Medicare Other

## 2013-05-21 ENCOUNTER — Encounter: Payer: Self-pay | Admitting: Family Medicine

## 2013-05-21 ENCOUNTER — Ambulatory Visit (INDEPENDENT_AMBULATORY_CARE_PROVIDER_SITE_OTHER): Payer: Medicare Other | Admitting: Family Medicine

## 2013-05-21 ENCOUNTER — Other Ambulatory Visit: Payer: Self-pay | Admitting: Family Medicine

## 2013-05-21 VITALS — BP 122/74 | HR 68 | Temp 98.5°F

## 2013-05-21 DIAGNOSIS — E785 Hyperlipidemia, unspecified: Secondary | ICD-10-CM

## 2013-05-21 DIAGNOSIS — I10 Essential (primary) hypertension: Secondary | ICD-10-CM

## 2013-05-21 DIAGNOSIS — J329 Chronic sinusitis, unspecified: Secondary | ICD-10-CM

## 2013-05-21 DIAGNOSIS — R739 Hyperglycemia, unspecified: Secondary | ICD-10-CM

## 2013-05-21 DIAGNOSIS — R7309 Other abnormal glucose: Secondary | ICD-10-CM

## 2013-05-21 LAB — LIPID PANEL
Cholesterol: 293 mg/dL — ABNORMAL HIGH (ref 0–200)
HDL: 43.8 mg/dL (ref >39.00)
Total CHOL/HDL Ratio: 7
Triglycerides: 252 mg/dL — ABNORMAL HIGH (ref 0.0–149.0)
VLDL: 50.4 mg/dL — ABNORMAL HIGH (ref 0.0–40.0)

## 2013-05-21 LAB — COMPREHENSIVE METABOLIC PANEL
ALT: 15 U/L (ref 0–35)
AST: 17 U/L (ref 0–37)
Albumin: 4.5 g/dL (ref 3.5–5.2)
Alkaline Phosphatase: 40 U/L (ref 39–117)
BUN: 30 mg/dL — ABNORMAL HIGH (ref 6–23)
CO2: 26 mEq/L (ref 19–32)
Calcium: 9.7 mg/dL (ref 8.4–10.5)
Chloride: 103 mEq/L (ref 96–112)
Creatinine, Ser: 1.2 mg/dL (ref 0.4–1.2)
GFR: 46.86 mL/min — ABNORMAL LOW (ref >60.00)
Glucose, Bld: 97 mg/dL (ref 70–99)
Potassium: 4.6 mEq/L (ref 3.5–5.1)
Sodium: 136 mEq/L (ref 135–145)
Total Bilirubin: 0.6 mg/dL (ref 0.3–1.2)
Total Protein: 8.3 g/dL (ref 6.0–8.3)

## 2013-05-21 LAB — TSH: TSH: 3.74 u[IU]/mL (ref 0.35–5.50)

## 2013-05-21 LAB — LDL CHOLESTEROL, DIRECT: Direct LDL: 204.2 mg/dL

## 2013-05-21 MED ORDER — AMOXICILLIN-POT CLAVULANATE 875-125 MG PO TABS: 1.0000 | ORAL_TABLET | Freq: Two times a day (BID) | ORAL | Status: AC

## 2013-05-21 NOTE — Progress Notes (Signed)
Pre-visit discussion using our clinic review tool. No additional management support is needed unless otherwise documented below in the visit note.  

## 2013-05-21 NOTE — Patient Instructions (Signed)
WASP for augmentin. Push fluids, rest, immediate release guaifenesin, tylenol for discomfort.

## 2013-05-21 NOTE — Assessment & Plan Note (Signed)
Given duration and prtocession of sxs ,anticipate acvute bacterial sinusitis Treat with immediate release guaifenesin with water and provided with script for augmentin 10d course. Pt agrees with plan.

## 2013-05-21 NOTE — Progress Notes (Signed)
  Subjective:    Patient ID: Ann White, female    DOB: 12-21-1945, 67 y.o.   MRN: [id number]  HPI CC: sinusitis?  Several week h/o sinus congestion worse on left side.  No fevers.  + L ear pain.  No tooth pain.  Mild cough at night. Tried OTC remedy, didn't help. No sick contacts at home.  About to take 1 wk trip to [place]. Tends to get sinus congestion yearly.  Past Medical History  Diagnosis Date  . Irritable bowel syndrome with constipation   . Arthritis   . Depression   . GERD (gastroesophageal reflux disease)   . Seasonal allergic rhinitis   . HTN (hypertension)   . History of chicken pox   . History of diverticulitis of colon   . HLD (hyperlipidemia)   . Lichen sclerosus et atrophicus   . Obesity   . Cervical spondylosis      Review of Systems Per HPI    Objective:   Physical Exam  Vitals reviewed. Constitutional: She appears well-developed and well-nourished. No distress.  HENT:  Head: Normocephalic and atraumatic.  Right Ear: Hearing, tympanic membrane, external ear and ear canal normal.  Left Ear: Hearing, tympanic membrane, external ear and ear canal normal.  Nose: No mucosal edema or rhinorrhea. Right sinus exhibits no maxillary sinus tenderness and no frontal sinus tenderness. Left sinus exhibits maxillary sinus tenderness. Left sinus exhibits no frontal sinus tenderness.  Mouth/Throat: Uvula is midline, oropharynx is clear and moist and mucous membranes are normal. No oropharyngeal exudate, posterior oropharyngeal edema, posterior oropharyngeal erythema or tonsillar abscesses.  Eyes: Conjunctivae and EOM are normal. Pupils are equal, round, and reactive to light. No scleral icterus.  Neck: Normal range of motion. Neck supple.  Cardiovascular: Normal rate, regular rhythm, normal heart sounds and intact distal pulses.   No murmur heard. Pulmonary/Chest: Effort normal and breath sounds normal. No respiratory distress. She has no wheezes. She has no  rales.  Lymphadenopathy:    She has no cervical adenopathy.  Skin: Skin is warm and dry. No rash noted.       Assessment & Plan:

## 2013-05-22 ENCOUNTER — Encounter: Payer: Self-pay | Admitting: *Deleted

## 2013-05-24 ENCOUNTER — Other Ambulatory Visit: Payer: Self-pay | Admitting: Family Medicine

## 2013-05-24 MED ORDER — RED YEAST RICE 600 MG PO TABS: 1200.0000 mg | ORAL_TABLET | Freq: Two times a day (BID) | ORAL | Status: DC

## 2013-05-27 ENCOUNTER — Other Ambulatory Visit: Payer: Medicare Other

## 2013-06-03 ENCOUNTER — Ambulatory Visit (AMBULATORY_SURGERY_CENTER): Payer: Self-pay

## 2013-06-03 ENCOUNTER — Telehealth: Payer: Self-pay | Admitting: Gastroenterology

## 2013-06-03 VITALS — Ht 64.0 in | Wt 187.0 lb

## 2013-06-03 DIAGNOSIS — Z1211 Encounter for screening for malignant neoplasm of colon: Secondary | ICD-10-CM

## 2013-06-03 MED ORDER — MOVIPREP 100 G PO SOLR: 1.0000 | Freq: Once | ORAL | Status: DC

## 2013-06-03 NOTE — Telephone Encounter (Signed)
Free voucher for MOVI prep left at 4th floor desk.

## 2013-06-05 ENCOUNTER — Encounter: Payer: Self-pay | Admitting: Gastroenterology

## 2013-06-10 ENCOUNTER — Other Ambulatory Visit: Payer: Self-pay | Admitting: Family Medicine

## 2013-06-14 ENCOUNTER — Other Ambulatory Visit: Payer: Self-pay | Admitting: Family Medicine

## 2013-06-15 HISTORY — PX: COLONOSCOPY: SHX174

## 2013-06-17 ENCOUNTER — Other Ambulatory Visit: Payer: Self-pay | Admitting: Family Medicine

## 2013-06-18 ENCOUNTER — Encounter: Payer: Medicare Other | Admitting: Gastroenterology

## 2013-07-02 ENCOUNTER — Ambulatory Visit (AMBULATORY_SURGERY_CENTER): Payer: Medicare Other | Admitting: Gastroenterology

## 2013-07-02 ENCOUNTER — Encounter: Payer: Self-pay | Admitting: Gastroenterology

## 2013-07-02 VITALS — BP 119/63 | HR 56 | Temp 97.4°F | Resp 15 | Ht 64.0 in | Wt 187.0 lb

## 2013-07-02 DIAGNOSIS — Z1211 Encounter for screening for malignant neoplasm of colon: Secondary | ICD-10-CM

## 2013-07-02 DIAGNOSIS — D126 Benign neoplasm of colon, unspecified: Secondary | ICD-10-CM

## 2013-07-02 MED ORDER — SODIUM CHLORIDE 0.9 % IV SOLN: 500.0000 mL | INTRAVENOUS | Status: DC

## 2013-07-02 NOTE — Progress Notes (Signed)
Pt wants two day prep, she had the 2 day prep for this procedure and wants the same for the next one.-adm

## 2013-07-02 NOTE — Op Note (Signed)
[facility] [address] [place], [address]   COLONOSCOPY PROCEDURE REPORT PATIENT: Ann White, Ann White  MR#: [id number] BIRTHDATE: July 20, 1945 , 67  yrs. old GENDER: Female ENDOSCOPIST: Meryl Dare, MD, [facility] PROCEDURE DATE:  07/02/2013 PROCEDURE:   Colonoscopy with snare polypectomy First Screening Colonoscopy - Avg.  risk and is 50 yrs.  old or older - No.  Prior Negative Screening - Now for repeat screening. N/A  History of Adenoma - Now for follow-up colonoscopy & has been > or = to 3 yrs.  Yes hx of adenoma.  Has been 3 or more years since last colonoscopy.  Polyps Removed Today? Yes. ASA CLASS:   Class II INDICATIONS:average risk screening. MEDICATIONS: MAC sedation, administered by CRNA and propofol (Diprivan) 300mg  IV DESCRIPTION OF PROCEDURE:   After the risks benefits and alternatives of the procedure were thoroughly explained, informed consent was obtained.  A digital rectal exam revealed no abnormalities of the rectum.   The LB [id number]-[id number]  endoscope was introduced through the anus and advanced to the cecum, which was identified by both the appendix and ileocecal valve. No adverse events experienced.   The quality of the prep was adequate, using MoviPrep  The instrument was then slowly withdrawn as the colon was fully examined.  COLON FINDINGS: Moderate diverticulosis was noted in the descending colon and sigmoid colon.  A sessile polyp measuring 7 mm in size was found in the ascending colon.  A polypectomy was performed with a cold snare. The resection was complete and the polyp tissue was completely retrieved.  Two sessile polyps measuring 4-8 mm in size were found in the transverse colon.  A polypectomy was performed with a cold snare. The resection was complete and the polyp tissue was completely retrieved.  The colon was otherwise normal. There was no diverticulosis, inflammation, polyps or cancers unless previously stated. Retroflexed views  revealed small internal hemorrhoids. The time to cecum=5 minutes 51 seconds.  Withdrawal time=12 minutes 25 seconds.  The scope was withdrawn and the procedure completed. COMPLICATIONS: There were no complications.  ENDOSCOPIC IMPRESSION: 1.   Moderate diverticulosis in the descending colon and sigmoid colon 2.   Sessile polyp measuring 7 mm in the ascending colon; polypectomy performed with a cold snare 3.   Two sessile polyps measuring 4-8 mm in the transverse colon; polypectomy performed with a cold snare 4.   Small internal hemorrhoids  RECOMMENDATIONS: 1.  Await pathology results 2.  Repeat Colonoscopy in 5 years.  eSigned:  Meryl Dare, MD, [facility] 07/02/2013 9:48 AM

## 2013-07-02 NOTE — Progress Notes (Signed)
Report to pacu rn, vss, bbs=clear 

## 2013-07-02 NOTE — Progress Notes (Signed)
No egg or soy allergy. ewm With hysterectomy pt was hard to waken post op. ewm

## 2013-07-02 NOTE — Patient Instructions (Addendum)

## 2013-07-03 ENCOUNTER — Telehealth: Payer: Self-pay | Admitting: *Deleted

## 2013-07-03 NOTE — Telephone Encounter (Signed)
  Follow up Call-  Call back number 07/02/2013  Post procedure Call Back phone  # [phone]  Permission to leave phone message Yes     Patient questions:  Do you have a fever, pain , or abdominal swelling? no Pain Score  0 *  Have you tolerated food without any problems? yes  Have you been able to return to your normal activities? yes  Do you have any questions about your discharge instructions: Diet   no Medications  no Follow up visit  no  Do you have questions or concerns about your Care? no  Actions: * If pain score is 4 or above: No action needed, pain <4.

## 2013-07-04 ENCOUNTER — Other Ambulatory Visit: Payer: Self-pay | Admitting: Family Medicine

## 2013-07-05 NOTE — Telephone Encounter (Signed)
plz phone in. 

## 2013-07-06 NOTE — Telephone Encounter (Signed)
Rx called in as directed.   

## 2013-07-07 ENCOUNTER — Other Ambulatory Visit: Payer: Self-pay | Admitting: Neurosurgery

## 2013-07-07 ENCOUNTER — Other Ambulatory Visit: Payer: Self-pay | Admitting: Family Medicine

## 2013-07-07 DIAGNOSIS — M4722 Other spondylosis with radiculopathy, cervical region: Secondary | ICD-10-CM

## 2013-07-07 NOTE — Telephone Encounter (Signed)
Rx called in as directed.   

## 2013-07-07 NOTE — Telephone Encounter (Signed)
plz phone in. 

## 2013-07-10 ENCOUNTER — Encounter: Payer: Self-pay | Admitting: Gastroenterology

## 2013-07-13 ENCOUNTER — Encounter: Payer: Self-pay | Admitting: Family Medicine

## 2013-07-13 ENCOUNTER — Ambulatory Visit
Admission: RE | Admit: 2013-07-13 | Discharge: 2013-07-13 | Disposition: A | Discharge status: Home / Self Care | Payer: Medicare Other | Source: Ambulatory Visit | Attending: Neurosurgery | Admitting: Neurosurgery

## 2013-07-13 VITALS — BP 110/53 | HR 70

## 2013-07-13 DIAGNOSIS — M4722 Other spondylosis with radiculopathy, cervical region: Secondary | ICD-10-CM

## 2013-07-13 MED ORDER — IOHEXOL 300 MG/ML  SOLN
1.0000 mL | Freq: Once | INTRAMUSCULAR | Status: AC | PRN
  Administered 2013-07-13: 1 mL via EPIDURAL

## 2013-07-13 MED ORDER — TRIAMCINOLONE ACETONIDE 40 MG/ML IJ SUSP (RADIOLOGY)
60.0000 mg | Freq: Once | INTRAMUSCULAR | Status: AC
  Administered 2013-07-13: 60 mg via EPIDURAL

## 2013-08-13 ENCOUNTER — Other Ambulatory Visit: Payer: Self-pay | Admitting: Family Medicine

## 2013-08-14 NOTE — Telephone Encounter (Signed)
Rx called in as directed.   

## 2013-08-14 NOTE — Telephone Encounter (Signed)
plz phone in. 

## 2013-08-19 ENCOUNTER — Other Ambulatory Visit: Payer: Self-pay | Admitting: Family Medicine

## 2013-08-19 DIAGNOSIS — N1832 Chronic kidney disease, stage 3b: Secondary | ICD-10-CM | POA: Insufficient documentation

## 2013-08-19 DIAGNOSIS — E785 Hyperlipidemia, unspecified: Secondary | ICD-10-CM

## 2013-08-19 DIAGNOSIS — N183 Chronic kidney disease, stage 3 unspecified: Secondary | ICD-10-CM | POA: Insufficient documentation

## 2013-08-19 DIAGNOSIS — I10 Essential (primary) hypertension: Secondary | ICD-10-CM

## 2013-08-19 DIAGNOSIS — D649 Anemia, unspecified: Secondary | ICD-10-CM

## 2013-08-19 DIAGNOSIS — N289 Disorder of kidney and ureter, unspecified: Secondary | ICD-10-CM

## 2013-08-24 ENCOUNTER — Other Ambulatory Visit (INDEPENDENT_AMBULATORY_CARE_PROVIDER_SITE_OTHER): Payer: Medicare Other

## 2013-08-24 DIAGNOSIS — I10 Essential (primary) hypertension: Secondary | ICD-10-CM

## 2013-08-24 DIAGNOSIS — E785 Hyperlipidemia, unspecified: Secondary | ICD-10-CM

## 2013-08-24 DIAGNOSIS — D649 Anemia, unspecified: Secondary | ICD-10-CM

## 2013-08-24 DIAGNOSIS — N289 Disorder of kidney and ureter, unspecified: Secondary | ICD-10-CM

## 2013-08-24 LAB — RENAL FUNCTION PANEL
Albumin: 4.3 g/dL (ref 3.5–5.2)
BUN: 24 mg/dL — ABNORMAL HIGH (ref 6–23)
CO2: 25 mEq/L (ref 19–32)
Calcium: 9.3 mg/dL (ref 8.4–10.5)
Chloride: 104 mEq/L (ref 96–112)
Creatinine, Ser: 1.1 mg/dL (ref 0.4–1.2)
GFR: 54.48 mL/min — ABNORMAL LOW (ref >60.00)
Glucose, Bld: 98 mg/dL (ref 70–99)
Phosphorus: 3.7 mg/dL (ref 2.3–4.6)
Potassium: 4.6 mEq/L (ref 3.5–5.1)
Sodium: 139 mEq/L (ref 135–145)

## 2013-08-24 LAB — LIPID PANEL
Cholesterol: 272 mg/dL — ABNORMAL HIGH (ref 0–200)
HDL: 48.5 mg/dL (ref >39.00)
Total CHOL/HDL Ratio: 6
Triglycerides: 207 mg/dL — ABNORMAL HIGH (ref 0.0–149.0)
VLDL: 41.4 mg/dL — ABNORMAL HIGH (ref 0.0–40.0)

## 2013-08-24 LAB — CBC WITH DIFFERENTIAL/PLATELET
Basophils Absolute: 0 10*3/uL (ref 0.0–0.1)
Basophils Relative: 0.6 % (ref 0.0–3.0)
Eosinophils Absolute: 0.2 10*3/uL (ref 0.0–0.7)
Eosinophils Relative: 4.4 % (ref 0.0–5.0)
HCT: 35.5 % — ABNORMAL LOW (ref 36.0–46.0)
Hemoglobin: 11.8 g/dL — ABNORMAL LOW (ref 12.0–15.0)
Lymphocytes Relative: 35.5 % (ref 12.0–46.0)
Lymphs Abs: 1.8 10*3/uL (ref 0.7–4.0)
MCHC: 33.2 g/dL (ref 30.0–36.0)
MCV: 90.5 fl (ref 78.0–100.0)
Monocytes Absolute: 0.3 10*3/uL (ref 0.1–1.0)
Monocytes Relative: 4.8 % (ref 3.0–12.0)
Neutro Abs: 2.8 10*3/uL (ref 1.4–7.7)
Neutrophils Relative %: 54.7 % (ref 43.0–77.0)
Platelets: 197 10*3/uL (ref 150.0–400.0)
RBC: 3.93 Mil/uL (ref 3.87–5.11)
RDW: 13.5 % (ref 11.5–14.6)
WBC: 5.2 10*3/uL (ref 4.5–10.5)

## 2013-08-24 LAB — LDL CHOLESTEROL, DIRECT: Direct LDL: 195.2 mg/dL

## 2013-08-25 LAB — PTH, INTACT AND CALCIUM
Calcium: 9.2 mg/dL (ref 8.4–10.5)
PTH: 60.1 pg/mL (ref 14.0–72.0)

## 2013-08-25 LAB — VITAMIN D 25 HYDROXY (VIT D DEFICIENCY, FRACTURES): Vit D, 25-Hydroxy: 36 ng/mL (ref 30–89)

## 2013-08-28 ENCOUNTER — Ambulatory Visit (INDEPENDENT_AMBULATORY_CARE_PROVIDER_SITE_OTHER): Payer: Medicare Other | Admitting: Family Medicine

## 2013-08-28 ENCOUNTER — Encounter: Payer: Self-pay | Admitting: Family Medicine

## 2013-08-28 ENCOUNTER — Other Ambulatory Visit: Payer: Self-pay | Admitting: Family Medicine

## 2013-08-28 VITALS — BP 134/84 | HR 72 | Temp 97.7°F | Ht 61.75 in | Wt 192.0 lb

## 2013-08-28 DIAGNOSIS — Z0000 Encounter for general adult medical examination without abnormal findings: Secondary | ICD-10-CM

## 2013-08-28 DIAGNOSIS — Z1231 Encounter for screening mammogram for malignant neoplasm of breast: Secondary | ICD-10-CM

## 2013-08-28 DIAGNOSIS — F32A Depression, unspecified: Secondary | ICD-10-CM

## 2013-08-28 DIAGNOSIS — F3289 Other specified depressive episodes: Secondary | ICD-10-CM

## 2013-08-28 DIAGNOSIS — Z23 Encounter for immunization: Secondary | ICD-10-CM

## 2013-08-28 DIAGNOSIS — N183 Chronic kidney disease, stage 3 unspecified: Secondary | ICD-10-CM

## 2013-08-28 DIAGNOSIS — I10 Essential (primary) hypertension: Secondary | ICD-10-CM

## 2013-08-28 DIAGNOSIS — F329 Major depressive disorder, single episode, unspecified: Secondary | ICD-10-CM

## 2013-08-28 DIAGNOSIS — E785 Hyperlipidemia, unspecified: Secondary | ICD-10-CM

## 2013-08-28 MED ORDER — ATORVASTATIN CALCIUM 10 MG PO TABS
10.0000 mg | ORAL_TABLET | Freq: Every day | ORAL | Status: DC
Start: 2013-08-28 — End: 2013-10-05

## 2013-08-28 NOTE — Assessment & Plan Note (Signed)
Chronic, stable. Continue meds.  New goal <130/80 given ckd

## 2013-08-28 NOTE — Assessment & Plan Note (Signed)
Reviewed labs with patient.  Normal PTH and vit D

## 2013-08-28 NOTE — Assessment & Plan Note (Signed)
Stable. Continue zoloft and klonopin.

## 2013-08-28 NOTE — Patient Instructions (Addendum)
Prevnar today Call breast center as I think we're due for mammogram. May finish red yeast rice then start low dose lipitor - if muscle aches, may take 3 times a week.  Let me know how you do with this. Good to see you today, return in 6 months for recheck cholesterol levels  Return as needed or in 1 year for next wellness exam.

## 2013-08-28 NOTE — Progress Notes (Signed)
BP 134/84  Pulse 72  Temp(Src) 97.7 F (36.5 C) (Oral)  Ht 5' 1.75" (1.568 m)  Wt 192 lb (87.091 kg)  BMI 35.42 kg/m2   CC: medicare wellness visit, initial  Subjective:    Patient ID: Ann White, female    DOB: [birth year], 68 y.o.   MRN: [id number]  HPI: Ann White is a 68 y.o. female presenting on 08/28/2013 with Annual Exam  Denies falls in last year  Denies depression, anhedonia.  Vision screen - passed  Hearing screen - passed  Preventative:  Shingles shot 2012.  Flu shot 04/2013 Pneumovax - 2014.  prevnar today Tetanus - Tdap today  Mammogram - 03/2012 s/p benign R breast biopsy, rec rpt 1 year  Colon cancer screening - colonoscopy 06/2013 mod diverticulosis, 3 tubular adenomas, int hem rpt 5 yrs [facility] Plan) DEXA 2013 - WNL Well woman - with prior PCP Dr. Rocky Link. S/p hysterectomy and oophorectomy for benign reason. 1990s.  Advanced directives: HC proxy would be husband.  Lives with husband and 2 cats. 2 grown children, 5 grandchildren.  Occupation: retired, worked in [employer]. Activity: no regular exercise. Does walk in summer  Diet: fruits/vegetables daily, good water.   Relevant past medical, surgical, family and social history reviewed and updated. Allergies and medications reviewed and updated. Current Outpatient Prescriptions on File Prior to Visit  Medication Sig  . Acetaminophen (ARTHRITIS PAIN RELIEF PO) Take by mouth.  . Biotin 5000 MCG CAPS Take by mouth.  . clonazePAM (KLONOPIN) 1 MG tablet TAKE 1 TABLET BY MOUTH TWICE A DAY AS NEEDED  . Coenzyme Q10 100 MG capsule Take 1 capsule (100 mg total) by mouth daily.  Mariane Baumgarten Calcium (STOOL SOFTENER PO) Take by mouth daily.   Marland Kitchen estradiol (CLIMARA - DOSED IN MG/24 HR) 0.075 mg/24hr Place 1 patch (0.075 mg total) onto the skin once a week.  . fluticasone (FLONASE) 50 MCG/ACT nasal spray Place 2 sprays into the nose daily.  Marland Kitchen gabapentin (NEURONTIN) 300 MG capsule TAKE ONE CAPSULE BY MOUTH AT BEDTIME  .  lisinopril-hydrochlorothiazide (PRINZIDE,ZESTORETIC) 10-12.5 MG per tablet TAKE 1 TABLET BY MOUTH DAILY  . MAGNESIUM CITRATE PO Take by mouth.  . Misc Natural Products (SINUS FORMULA PO) Take by mouth.  . Multiple Vitamin (MULTIVITAMIN) tablet Take 1 tablet by mouth daily.  . Omega-3 Fatty Acids (FISH OIL) 1000 MG CAPS Take one by mouth two times a day  . pantoprazole (PROTONIX) 40 MG tablet TAKE 1 TABLET BY MOUTH TWICE A DAY  . sertraline (ZOLOFT) 100 MG tablet TAKE 1 TABLET (100 MG TOTAL) BY MOUTH DAILY.  . traMADol (ULTRAM) 50 MG tablet TAKE 1 TABLET BY MOUTH EVERY 6 HOURS AS NEEDED   No current facility-administered medications on file prior to visit.    Review of Systems  Constitutional: Negative for fever, chills, activity change, appetite change, fatigue and unexpected weight change.  HENT: Negative for hearing loss.   Eyes: Negative for visual disturbance.  Respiratory: Negative for cough, chest tightness, shortness of breath and wheezing.   Cardiovascular: Negative for chest pain, palpitations and leg swelling.  Gastrointestinal: Negative for nausea, vomiting, abdominal pain, diarrhea, constipation, blood in stool and abdominal distention.  Genitourinary: Negative for hematuria and difficulty urinating.  Musculoskeletal: Negative for arthralgias, myalgias and neck pain.  Skin: Negative for rash.  Neurological: Positive for headaches. Negative for dizziness, seizures and syncope.  Hematological: Negative for adenopathy. Bruises/bleeds easily.  Psychiatric/Behavioral: Negative for dysphoric mood. The patient is not nervous/anxious.  Per HPI unless specifically indicated above    Objective:    BP 134/84  Pulse 72  Temp(Src) 97.7 F (36.5 C) (Oral)  Ht 5' 1.75" (1.568 m)  Wt 192 lb (87.091 kg)  BMI 35.42 kg/m2  Physical Exam  Nursing note and vitals reviewed. Constitutional: She is oriented to person, place, and time. She appears well-developed and well-nourished. No  distress.  HENT:  Head: Normocephalic and atraumatic.  Right Ear: Hearing, tympanic membrane, external ear and ear canal normal.  Left Ear: Hearing, tympanic membrane, external ear and ear canal normal.  Nose: Nose normal.  Mouth/Throat: Uvula is midline, oropharynx is clear and moist and mucous membranes are normal. No oropharyngeal exudate, posterior oropharyngeal edema or posterior oropharyngeal erythema.  Eyes: Conjunctivae and EOM are normal. Pupils are equal, round, and reactive to light. No scleral icterus.  Neck: Normal range of motion. Neck supple. Carotid bruit is not present. No thyromegaly present.  Cardiovascular: Normal rate, regular rhythm, normal heart sounds and intact distal pulses.   No murmur heard. Pulses:      Radial pulses are 2+ on the right side, and 2+ on the left side.  Pulmonary/Chest: Effort normal and breath sounds normal. No respiratory distress. She has no wheezes. She has no rales. Right breast exhibits no inverted nipple, no mass, no nipple discharge, no skin change and no tenderness. Left breast exhibits no inverted nipple, no mass, no nipple discharge, no skin change and no tenderness.  Some benign feeling lumps bilateral lower breasts  Abdominal: Soft. Bowel sounds are normal. She exhibits no distension and no mass. There is no tenderness. There is no rebound and no guarding.  Musculoskeletal: Normal range of motion. She exhibits no edema.  Lymphadenopathy:       Head (right side): No submandibular, no tonsillar, no preauricular and no posterior auricular adenopathy present.       Head (left side): No submandibular, no tonsillar, no preauricular and no posterior auricular adenopathy present.    She has no cervical adenopathy.    She has no axillary adenopathy.       Right axillary: No lateral adenopathy present.       Left axillary: No lateral adenopathy present.      Right: No supraclavicular adenopathy present.       Left: No supraclavicular adenopathy  present.  Neurological: She is alert and oriented to person, place, and time.  CN grossly intact, station and gait intact 3/3 recall Normal serial 7s  Skin: Skin is warm and dry. No rash noted.  Psychiatric: She has a normal mood and affect. Her behavior is normal. Judgment and thought content normal.   Results for orders placed in visit on 08/24/13  LIPID PANEL      Result Value Ref Range   Cholesterol 272 (*) 0 - 200 mg/dL   Triglycerides 207.0 (*) 0.0 - 149.0 mg/dL   HDL 48.50  >39.00 mg/dL   VLDL 41.4 (*) 0.0 - 40.0 mg/dL   Total CHOL/HDL Ratio 6    CBC WITH DIFFERENTIAL      Result Value Ref Range   WBC 5.2  4.5 - 10.5 K/uL   RBC 3.93  3.87 - 5.11 Mil/uL   Hemoglobin 11.8 (*) 12.0 - 15.0 g/dL   HCT 35.5 (*) 36.0 - 46.0 %   MCV 90.5  78.0 - 100.0 fl   MCHC 33.2  30.0 - 36.0 g/dL   RDW 13.5  11.5 - 14.6 %   Platelets 197.0  150.0 -  400.0 K/uL   Neutrophils Relative % 54.7  43.0 - 77.0 %   Lymphocytes Relative 35.5  12.0 - 46.0 %   Monocytes Relative 4.8  3.0 - 12.0 %   Eosinophils Relative 4.4  0.0 - 5.0 %   Basophils Relative 0.6  0.0 - 3.0 %   Neutro Abs 2.8  1.4 - 7.7 K/uL   Lymphs Abs 1.8  0.7 - 4.0 K/uL   Monocytes Absolute 0.3  0.1 - 1.0 K/uL   Eosinophils Absolute 0.2  0.0 - 0.7 K/uL   Basophils Absolute 0.0  0.0 - 0.1 K/uL  PTH, INTACT AND CALCIUM      Result Value Ref Range   PTH 60.1  14.0 - 72.0 pg/mL   Calcium 9.2  8.4 - 10.5 mg/dL  VITAMIN D 25 HYDROXY      Result Value Ref Range   Vit D, 25-Hydroxy 36  30 - 89 ng/mL  RENAL FUNCTION PANEL      Result Value Ref Range   Sodium 139  135 - 145 mEq/L   Potassium 4.6  3.5 - 5.1 mEq/L   Chloride 104  96 - 112 mEq/L   CO2 25  19 - 32 mEq/L   Calcium 9.3  8.4 - 10.5 mg/dL   Albumin 4.3  3.5 - 5.2 g/dL   BUN 24 (*) 6 - 23 mg/dL   Creatinine, Ser 1.1  0.4 - 1.2 mg/dL   Glucose, Bld 98  70 - 99 mg/dL   Phosphorus 3.7  2.3 - 4.6 mg/dL   GFR 54.48 (*) >60.00 mL/min  LDL CHOLESTEROL, DIRECT      Result Value  Ref Range   Direct LDL 195.2        Assessment & Plan:   Problem List Items Addressed This Visit   CKD (chronic kidney disease) stage 3, GFR 30-59 ml/min     Reviewed labs with patient.  Normal PTH and vit D    Depression     Stable. Continue zoloft and klonopin.    HLD (hyperlipidemia)     Stop RYR, start low dose lipitor as pt not reaching goal of LDL <130 given fmhx    Relevant Medications      atorvastatin (LIPITOR) tablet   HTN (hypertension)     Chronic, stable. Continue meds.  New goal <130/80 given ckd    Medicare annual wellness visit, initial - Primary     I have personally reviewed the Medicare Annual Wellness questionnaire and have noted 1. The patient's medical and social history 2. Their use of alcohol, tobacco or illicit drugs 3. Their current medications and supplements 4. The patient's functional ability including ADL's, fall risks, home safety risks and hearing or visual impairment. 5. Diet and physical activity 6. Evidence for depression or mood disorders The patients weight, height, BMI have been recorded in the chart.  Hearing and vision has been addressed. I have made referrals, counseling and provided education to the patient based review of the above and I have provided the pt with a written personalized care plan for preventive services. See scanned questionairre. Advanced directives discussed: HCPOA is husband   Reviewed preventative protocols and updated unless pt declined. prevnar today Pt will call breast center to check on mammogram as seems she's due.        Follow up plan: Return in about 1 year (around 08/28/2014), or as needed, for annual exam, prior fasting for blood work.

## 2013-08-28 NOTE — Assessment & Plan Note (Signed)
Stop RYR, start low dose lipitor as pt not reaching goal of LDL <130 given fmhx

## 2013-08-28 NOTE — Assessment & Plan Note (Signed)
I have personally reviewed the Medicare Annual Wellness questionnaire and have noted 1. The patient's medical and social history 2. Their use of alcohol, tobacco or illicit drugs 3. Their current medications and supplements 4. The patient's functional ability including ADL's, fall risks, home safety risks and hearing or visual impairment. 5. Diet and physical activity 6. Evidence for depression or mood disorders The patients weight, height, BMI have been recorded in the chart.  Hearing and vision has been addressed. I have made referrals, counseling and provided education to the patient based review of the above and I have provided the pt with a written personalized care plan for preventive services. See scanned questionairre. Advanced directives discussed: HCPOA is husband   Reviewed preventative protocols and updated unless pt declined. prevnar today Pt will call breast center to check on mammogram as seems she's due.

## 2013-08-28 NOTE — Addendum Note (Signed)
Addended by: Royann Shivers A on: 08/28/2013 04:12 PM   Modules accepted: Orders

## 2013-08-31 ENCOUNTER — Telehealth: Payer: Self-pay | Admitting: Family Medicine

## 2013-08-31 NOTE — Telephone Encounter (Signed)
Relevant patient education assigned to patient using Emmi. ° °

## 2013-09-02 ENCOUNTER — Encounter: Payer: Self-pay | Admitting: Radiology

## 2013-09-18 ENCOUNTER — Ambulatory Visit
Admission: RE | Admit: 2013-09-18 | Discharge: 2013-09-18 | Disposition: A | Discharge status: Home / Self Care | Payer: Self-pay | Source: Ambulatory Visit | Attending: Family Medicine | Admitting: Family Medicine

## 2013-09-18 DIAGNOSIS — Z1231 Encounter for screening mammogram for malignant neoplasm of breast: Secondary | ICD-10-CM

## 2013-09-18 LAB — HM MAMMOGRAPHY: HM Mammogram: NORMAL

## 2013-09-21 ENCOUNTER — Encounter: Payer: Self-pay | Admitting: *Deleted

## 2013-09-23 ENCOUNTER — Other Ambulatory Visit: Payer: Self-pay | Admitting: Family Medicine

## 2013-09-23 NOTE — Telephone Encounter (Signed)
Rx called in as directed.   

## 2013-09-23 NOTE — Telephone Encounter (Signed)
plz phone in. 

## 2013-09-23 NOTE — Telephone Encounter (Signed)
Ok to refill 

## 2013-10-05 ENCOUNTER — Other Ambulatory Visit: Payer: Self-pay | Admitting: Family Medicine

## 2013-10-05 ENCOUNTER — Telehealth: Payer: Self-pay | Admitting: Family Medicine

## 2013-10-05 NOTE — Telephone Encounter (Signed)
Spoke with patient at husband's visit today - states atorvastatin 10mg  may be causing whole body itching - pt stopped for last 4 days and itch improved. I suggested lower dose trial (1/2 tablet of 10mg  daily).   If persistent rash/itch, pt to let me know. Prior pravastatin and higher dose lipitor caused myalgias.

## 2013-10-19 NOTE — Addendum Note (Signed)
Addended by: Ria Bush on: 10/19/2013 11:09 PM   Modules accepted: Orders

## 2013-10-19 NOTE — Telephone Encounter (Addendum)
Pt left v/m; pt has been taking lipitor 10 mg 1/2 tab daily; pt is still experiencing itching and legs ache; pt has stopped med and request substitute med to [facility]. Pt request cb.

## 2013-10-19 NOTE — Telephone Encounter (Signed)
Would suggest restart red yeast rice at 600mg  twice daily. She could also do trial of zetia if she'd like to price it out. If desired, may send in zetia 10mg  daily #30 with RF3.

## 2013-10-21 NOTE — Telephone Encounter (Signed)
Pt is aware as instructed and states she will look into her insurance coverage for the Zetia and let us know whether to send it in or not

## 2013-11-06 ENCOUNTER — Other Ambulatory Visit: Payer: Self-pay | Admitting: Family Medicine

## 2013-11-06 NOTE — Telephone Encounter (Signed)
I can refill times one in PCP absence  Px written for call in

## 2013-11-06 NOTE — Telephone Encounter (Signed)
Rx called in as prescribed 

## 2013-11-06 NOTE — Telephone Encounter (Signed)
Ok to refill in Dr. Synthia Innocent absence? Last filled 33/11/15 and last OV 08/31/13.

## 2013-11-09 ENCOUNTER — Other Ambulatory Visit: Payer: Self-pay | Admitting: Family Medicine

## 2013-11-09 NOTE — Telephone Encounter (Signed)
Ok to refill 

## 2013-11-09 NOTE — Telephone Encounter (Signed)
plz phone in. 

## 2013-11-10 NOTE — Telephone Encounter (Signed)
Rx called in as directed.   

## 2013-12-10 ENCOUNTER — Other Ambulatory Visit: Payer: Self-pay | Admitting: Family Medicine

## 2013-12-11 NOTE — Telephone Encounter (Signed)
Electronic refill request, Rout to PCP

## 2013-12-11 NOTE — Telephone Encounter (Signed)
Rx called in as directed.   

## 2013-12-11 NOTE — Telephone Encounter (Signed)
plz phone in. 

## 2013-12-30 ENCOUNTER — Other Ambulatory Visit: Payer: Self-pay | Admitting: Family Medicine

## 2013-12-30 NOTE — Telephone Encounter (Signed)
Ok to refill 

## 2013-12-31 NOTE — Telephone Encounter (Signed)
plz phone in. 

## 2013-12-31 NOTE — Telephone Encounter (Signed)
Rx called in as directed.   

## 2014-01-08 ENCOUNTER — Other Ambulatory Visit: Payer: Self-pay | Admitting: Family Medicine

## 2014-01-22 ENCOUNTER — Other Ambulatory Visit: Payer: Self-pay | Admitting: Family Medicine

## 2014-01-22 NOTE — Telephone Encounter (Signed)
plz phone in. 

## 2014-01-22 NOTE — Telephone Encounter (Signed)
Rx called in as directed.   

## 2014-02-06 ENCOUNTER — Other Ambulatory Visit: Payer: Self-pay | Admitting: Family Medicine

## 2014-02-20 ENCOUNTER — Other Ambulatory Visit: Payer: Self-pay | Admitting: Family Medicine

## 2014-02-28 ENCOUNTER — Other Ambulatory Visit: Payer: Self-pay | Admitting: Family Medicine

## 2014-03-01 NOTE — Telephone Encounter (Signed)
Ok to refill 

## 2014-03-01 NOTE — Telephone Encounter (Signed)
plz phone in. 

## 2014-03-02 ENCOUNTER — Other Ambulatory Visit: Payer: Self-pay | Admitting: Family Medicine

## 2014-03-02 NOTE — Telephone Encounter (Signed)
Rx called in as directed.   

## 2014-04-12 ENCOUNTER — Other Ambulatory Visit: Payer: Self-pay | Admitting: Family Medicine

## 2014-04-12 NOTE — Telephone Encounter (Signed)
plz phone in. 

## 2014-04-13 ENCOUNTER — Other Ambulatory Visit: Payer: Self-pay | Admitting: Family Medicine

## 2014-04-13 NOTE — Telephone Encounter (Signed)
plz phone in if not already done. 

## 2014-04-13 NOTE — Telephone Encounter (Signed)
rx called into pharmacy

## 2014-04-14 ENCOUNTER — Other Ambulatory Visit: Payer: Self-pay | Admitting: Family Medicine

## 2014-04-15 NOTE — Telephone Encounter (Signed)
plz phone in if not already done. 

## 2014-04-15 NOTE — Telephone Encounter (Signed)
Rx called to pharmacy as instructed. 

## 2014-05-17 ENCOUNTER — Other Ambulatory Visit: Payer: Self-pay | Admitting: Family Medicine

## 2014-05-17 NOTE — Telephone Encounter (Signed)
plz phone in. 

## 2014-05-17 NOTE — Telephone Encounter (Signed)
Ok to refill 

## 2014-05-18 NOTE — Telephone Encounter (Signed)
Rx called in as directed.   

## 2014-06-23 ENCOUNTER — Other Ambulatory Visit: Payer: Self-pay | Admitting: Family Medicine

## 2014-06-23 NOTE — Telephone Encounter (Signed)
Rx called in as directed.   

## 2014-06-23 NOTE — Telephone Encounter (Signed)
Ok to refill 

## 2014-06-23 NOTE — Telephone Encounter (Signed)
plz phone in. 

## 2014-06-24 ENCOUNTER — Other Ambulatory Visit: Payer: Self-pay | Admitting: Family Medicine

## 2014-06-24 NOTE — Telephone Encounter (Signed)
plz phone in. 

## 2014-06-24 NOTE — Telephone Encounter (Signed)
Rx called in as directed.   

## 2014-06-26 ENCOUNTER — Other Ambulatory Visit: Payer: Self-pay | Admitting: Family Medicine

## 2014-07-13 ENCOUNTER — Other Ambulatory Visit: Payer: Self-pay | Admitting: Family Medicine

## 2014-07-16 DIAGNOSIS — I77811 Abdominal aortic ectasia: Secondary | ICD-10-CM

## 2014-07-16 HISTORY — DX: Abdominal aortic ectasia: I77.811

## 2014-07-19 ENCOUNTER — Other Ambulatory Visit: Payer: Self-pay | Admitting: Family Medicine

## 2014-07-19 NOTE — Telephone Encounter (Signed)
Last filled 06/23/2014

## 2014-07-21 NOTE — Telephone Encounter (Signed)
Rx called in as directed.   

## 2014-07-21 NOTE — Telephone Encounter (Signed)
plz phone in. 

## 2014-07-22 ENCOUNTER — Telehealth: Payer: Self-pay

## 2014-07-22 ENCOUNTER — Other Ambulatory Visit: Payer: Self-pay | Admitting: *Deleted

## 2014-07-22 MED ORDER — GABAPENTIN 300 MG PO CAPS: 300.0000 mg | ORAL_CAPSULE | Freq: Every day | ORAL | Status: DC

## 2014-07-22 NOTE — Telephone Encounter (Signed)
Request for refills to go to mail order. Will need written scripts for faxing.

## 2014-07-22 NOTE — Telephone Encounter (Signed)
ok 

## 2014-07-22 NOTE — Telephone Encounter (Signed)
Pt left v/m; pt is going [facility];pt wanted Dr Synthia Innocent CMA to be aware [facility] for refills. No cb needed.

## 2014-07-23 MED ORDER — CLONAZEPAM 1 MG PO TABS: 1.0000 mg | ORAL_TABLET | Freq: Two times a day (BID) | ORAL | Status: DC | PRN

## 2014-07-23 MED ORDER — TRAMADOL HCL 50 MG PO TABS: 50.0000 mg | ORAL_TABLET | Freq: Three times a day (TID) | ORAL | Status: DC | PRN

## 2014-07-23 NOTE — Telephone Encounter (Signed)
Pt left v/m requesting cb about gabapentin,clonazepam and tramadol refills.

## 2014-07-23 NOTE — Telephone Encounter (Signed)
Rx's faxed and patient notified.

## 2014-07-23 NOTE — Telephone Encounter (Signed)
Printed and in Kim's box 

## 2014-08-02 ENCOUNTER — Other Ambulatory Visit: Payer: Self-pay

## 2014-08-02 NOTE — Telephone Encounter (Signed)
Pt left v/m requesting 90 day rx with 3 refills for estradiol,lisinopril-HCTZ,pantoprazole and sertraline [facility]. Pt last seen 08/28/2013; no future appt scheduled.Please advise.

## 2014-08-03 MED ORDER — LISINOPRIL-HYDROCHLOROTHIAZIDE 10-12.5 MG PO TABS: 1.0000 | ORAL_TABLET | Freq: Every day | ORAL | Status: DC

## 2014-08-03 MED ORDER — SERTRALINE HCL 100 MG PO TABS: 100.0000 mg | ORAL_TABLET | Freq: Every day | ORAL | Status: DC

## 2014-08-03 MED ORDER — PANTOPRAZOLE SODIUM 40 MG PO TBEC: 40.0000 mg | DELAYED_RELEASE_TABLET | Freq: Two times a day (BID) | ORAL | Status: DC

## 2014-08-03 MED ORDER — ESTRADIOL 0.075 MG/24HR TD PTWK: MEDICATED_PATCH | TRANSDERMAL | Status: DC

## 2014-08-03 NOTE — Telephone Encounter (Signed)
Printed #90 with RF 1 as pt due for f/u and placed in Kim's box.

## 2014-08-03 NOTE — Telephone Encounter (Signed)
Rxs faxed to [facility].

## 2014-08-09 ENCOUNTER — Ambulatory Visit (INDEPENDENT_AMBULATORY_CARE_PROVIDER_SITE_OTHER): Payer: Medicare HMO | Admitting: Family Medicine

## 2014-08-09 ENCOUNTER — Encounter: Payer: Self-pay | Admitting: Family Medicine

## 2014-08-09 VITALS — BP 116/64 | HR 76 | Temp 97.9°F | Resp 16 | Ht 64.0 in | Wt 185.8 lb

## 2014-08-09 DIAGNOSIS — R51 Headache: Secondary | ICD-10-CM

## 2014-08-09 DIAGNOSIS — H9201 Otalgia, right ear: Secondary | ICD-10-CM

## 2014-08-09 DIAGNOSIS — R519 Headache, unspecified: Secondary | ICD-10-CM

## 2014-08-09 LAB — SEDIMENTATION RATE: Sed Rate: 41 mm/hr — ABNORMAL HIGH (ref 0–22)

## 2014-08-09 MED ORDER — HYDROCORTISONE-ACETIC ACID 1-2 % OT SOLN: 5.0000 [drp] | Freq: Three times a day (TID) | OTIC | Status: DC

## 2014-08-09 NOTE — Progress Notes (Signed)
Pre visit review using our clinic review tool, if applicable. No additional management support is needed unless otherwise documented below in the visit note. 

## 2014-08-09 NOTE — Patient Instructions (Signed)
I think you have irritation of your external ear canal from dry wax that was removed today. Treat with vosol HC otic drops for next few days. If not improving as expected over next 2-3 days, let us know. May continue tylenol for pain. ESR blood test today.

## 2014-08-09 NOTE — Assessment & Plan Note (Signed)
Anticipate irritant external otitis from dry hard piece of wax attached to external canal (removed today). Treat preventatively with vosol HC otic drops. Discussed with patient that some sxs are concerning for GCA - will check ESR to help r/o this condition. If sxs resolve with vosol HC, this would also effectively rule out GCA. Pt/husband agree with plan.

## 2014-08-09 NOTE — Progress Notes (Signed)
BP 116/64 mmHg  Pulse 76  Temp(Src) 97.9 F (36.6 C) (Oral)  Resp 16  Ht 5' 4"  (1.626 m)  Wt 185 lb 12.8 oz (84.278 kg)  BMI 31.88 kg/m2  SpO2 97%   CC: earache  Subjective:    Patient ID: Ann White, female    DOB: 1945/08/17, 69 y.o.   MRN: [id number]  HPI: Ann White is a 69 y.o. female presenting on 08/09/2014 for Otalgia   Not seen since 08/2013.  R earache with radiation to R maxillary region that started 3d ago. + sinus pressure and congestion. + HA on right facial/temple but no vision changes.   No fevers/chills, cough, congestion.  No draining from ears. No recent swimming. Denies inciting trauma/fall No sick contacts at home. Using OTC sinus relief med and tylenol and heating pad which may help some.  Did receive flu shot this year.  H/o recurrent AOM in past.   Relevant past medical, surgical, family and social history reviewed and updated as indicated. Interim medical history since our last visit reviewed. Allergies and medications reviewed and updated. Current Outpatient Prescriptions on File Prior to Visit  Medication Sig  . Acetaminophen (ARTHRITIS PAIN RELIEF PO) Take by mouth.  . Biotin 5000 MCG CAPS Take by mouth.  . clonazePAM (KLONOPIN) 1 MG tablet Take 1 tablet (1 mg total) by mouth 2 (two) times daily as needed.  . Coenzyme Q10 100 MG capsule Take 1 capsule (100 mg total) by mouth daily.  Mariane Baumgarten Calcium (STOOL SOFTENER PO) Take by mouth daily.   Marland Kitchen estradiol (CLIMARA - DOSED IN MG/24 HR) 0.075 mg/24hr patch PLACE 1 PATCH (0.075 MG TOTAL) ONTO THE SKIN ONCE A WEEK.  . fluticasone (FLONASE) 50 MCG/ACT nasal spray Place 2 sprays into the nose daily.  Marland Kitchen gabapentin (NEURONTIN) 300 MG capsule Take 1 capsule (300 mg total) by mouth at bedtime.  Marland Kitchen lisinopril-hydrochlorothiazide (PRINZIDE,ZESTORETIC) 10-12.5 MG per tablet Take 1 tablet by mouth daily.  Marland Kitchen MAGNESIUM CITRATE PO Take by mouth.  . Misc Natural Products (SINUS FORMULA PO) Take by mouth.  .  Multiple Vitamin (MULTIVITAMIN) tablet Take 1 tablet by mouth daily.  . Omega-3 Fatty Acids (FISH OIL) 1000 MG CAPS Take one by mouth two times a day  . pantoprazole (PROTONIX) 40 MG tablet Take 1 tablet (40 mg total) by mouth 2 (two) times daily.  . Red Yeast Rice 600 MG CAPS Take 1 capsule by mouth 2 (two) times daily.  . sertraline (ZOLOFT) 100 MG tablet Take 1 tablet (100 mg total) by mouth daily.  . traMADol (ULTRAM) 50 MG tablet Take 1 tablet (50 mg total) by mouth 3 (three) times daily as needed for moderate pain.   No current facility-administered medications on file prior to visit.    Review of Systems Per HPI unless specifically indicated above     Objective:    BP 116/64 mmHg  Pulse 76  Temp(Src) 97.9 F (36.6 C) (Oral)  Resp 16  Ht 5' 4"  (1.626 m)  Wt 185 lb 12.8 oz (84.278 kg)  BMI 31.88 kg/m2  SpO2 97%  Wt Readings from Last 3 Encounters:  08/09/14 185 lb 12.8 oz (84.278 kg)  08/28/13 192 lb (87.091 kg)  07/02/13 187 lb (84.823 kg)    Physical Exam  Constitutional: She appears well-developed and well-nourished. No distress.  HENT:  Head: Normocephalic and atraumatic.  Right Ear: Hearing, tympanic membrane and external ear normal.  Left Ear: Hearing, tympanic membrane, external ear and  ear canal normal.  Nose: No mucosal edema or rhinorrhea. Right sinus exhibits no maxillary sinus tenderness and no frontal sinus tenderness. Left sinus exhibits no maxillary sinus tenderness and no frontal sinus tenderness.  Mouth/Throat: Uvula is midline, oropharynx is clear and moist and mucous membranes are normal. No oropharyngeal exudate, posterior oropharyngeal edema, posterior oropharyngeal erythema or tonsillar abscesses.  No temporal bruit. Slightly more prominent R temporal artery compared to left Dry cerumen attached to external ear canal removed with alligator forceps today, with residual raw skin of external canal. TMs bilaterally pearly grey with good light reflex.    Eyes: Conjunctivae and EOM are normal. Pupils are equal, round, and reactive to light. No scleral icterus.  Neck: Normal range of motion. Neck supple.  Cardiovascular: Normal rate, regular rhythm, normal heart sounds and intact distal pulses.   No murmur heard. Pulmonary/Chest: Effort normal and breath sounds normal. No respiratory distress. She has no wheezes. She has no rales.  Lymphadenopathy:    She has no cervical adenopathy.  Skin: Skin is warm and dry. No rash noted.  Nursing note and vitals reviewed.     Assessment & Plan:   Problem List Items Addressed This Visit    Right ear pain - Primary    Anticipate irritant external otitis from dry hard piece of wax attached to external canal (removed today). Treat preventatively with vosol HC otic drops. Discussed with patient that some sxs are concerning for GCA - will check ESR to help r/o this condition. If sxs resolve with vosol HC, this would also effectively rule out GCA. Pt/husband agree with plan.       Other Visit Diagnoses    Right sided temporal headache        Relevant Orders    Sedimentation rate        Follow up plan: Return if symptoms worsen or fail to improve.

## 2014-09-21 ENCOUNTER — Other Ambulatory Visit: Payer: Self-pay

## 2014-09-21 DIAGNOSIS — Z1231 Encounter for screening mammogram for malignant neoplasm of breast: Secondary | ICD-10-CM

## 2014-10-07 ENCOUNTER — Other Ambulatory Visit: Payer: Self-pay

## 2014-10-07 ENCOUNTER — Ambulatory Visit
Admission: RE | Admit: 2014-10-07 | Discharge: 2014-10-07 | Disposition: A | Discharge status: Home / Self Care | Payer: Medicare HMO | Source: Ambulatory Visit

## 2014-10-07 DIAGNOSIS — Z1231 Encounter for screening mammogram for malignant neoplasm of breast: Secondary | ICD-10-CM

## 2014-10-07 LAB — HM MAMMOGRAPHY: HM Mammogram: NORMAL

## 2014-10-11 ENCOUNTER — Encounter: Payer: Self-pay | Admitting: *Deleted

## 2014-10-12 ENCOUNTER — Other Ambulatory Visit: Payer: Self-pay

## 2014-10-12 MED ORDER — GABAPENTIN 300 MG PO CAPS: 300.0000 mg | ORAL_CAPSULE | Freq: Every day | ORAL | Status: DC

## 2014-12-17 ENCOUNTER — Other Ambulatory Visit: Payer: Self-pay

## 2014-12-17 MED ORDER — CLONAZEPAM 1 MG PO TABS: 1.0000 mg | ORAL_TABLET | Freq: Two times a day (BID) | ORAL | Status: DC | PRN

## 2014-12-17 MED ORDER — LISINOPRIL-HYDROCHLOROTHIAZIDE 10-12.5 MG PO TABS: 1.0000 | ORAL_TABLET | Freq: Every day | ORAL | Status: DC

## 2014-12-17 NOTE — Telephone Encounter (Signed)
plz send in clonazepam.

## 2014-12-17 NOTE — Telephone Encounter (Signed)
Pt left v/m requesting refill on clonazepam and [facility]ril [facility]; pt is going out of town in a week and wants to get med before leaving town for [facility]. Pt is having 50th wedding anniversary. Pt will cb and scheduled cpx upon return from [facility]. Last annual exam 08/28/13.Please advise.

## 2014-12-20 NOTE — Telephone Encounter (Signed)
Rx faxed to [facility].

## 2015-01-13 ENCOUNTER — Other Ambulatory Visit: Payer: Self-pay | Admitting: Family Medicine

## 2015-02-04 ENCOUNTER — Other Ambulatory Visit: Payer: Self-pay | Admitting: Neurosurgery

## 2015-02-04 DIAGNOSIS — M542 Cervicalgia: Secondary | ICD-10-CM

## 2015-02-04 DIAGNOSIS — M4316 Spondylolisthesis, lumbar region: Secondary | ICD-10-CM

## 2015-02-10 ENCOUNTER — Ambulatory Visit
Admission: RE | Admit: 2015-02-10 | Discharge: 2015-02-10 | Disposition: A | Discharge status: Home / Self Care | Payer: Medicare HMO | Source: Ambulatory Visit | Attending: Neurosurgery | Admitting: Neurosurgery

## 2015-02-10 VITALS — BP 125/56 | HR 47

## 2015-02-10 DIAGNOSIS — M4316 Spondylolisthesis, lumbar region: Secondary | ICD-10-CM

## 2015-02-10 DIAGNOSIS — M47812 Spondylosis without myelopathy or radiculopathy, cervical region: Secondary | ICD-10-CM

## 2015-02-10 DIAGNOSIS — M542 Cervicalgia: Secondary | ICD-10-CM

## 2015-02-10 MED ORDER — TRIAMCINOLONE ACETONIDE 40 MG/ML IJ SUSP (RADIOLOGY)
60.0000 mg | Freq: Once | INTRAMUSCULAR | Status: AC
  Administered 2015-02-10: 60 mg via EPIDURAL

## 2015-02-10 MED ORDER — IOHEXOL 300 MG/ML  SOLN
1.0000 mL | Freq: Once | INTRAMUSCULAR | Status: AC | PRN
Start: 2015-02-10 — End: 2015-02-10
  Administered 2015-02-10: 1 mL via INTRAVENOUS

## 2015-02-10 NOTE — Discharge Instructions (Signed)

## 2015-02-14 ENCOUNTER — Other Ambulatory Visit: Payer: Self-pay | Admitting: Family Medicine

## 2015-02-14 NOTE — Telephone Encounter (Signed)
Received refill request electronically See drug warning with Tramadol Has appointment scheduled 03/16/15 Is it okay to refill?

## 2015-03-15 ENCOUNTER — Telehealth: Payer: Self-pay

## 2015-03-15 MED ORDER — TRAMADOL HCL 50 MG PO TABS: 50.0000 mg | ORAL_TABLET | Freq: Three times a day (TID) | ORAL | Status: DC | PRN

## 2015-03-15 NOTE — Telephone Encounter (Signed)
Signed.

## 2015-03-15 NOTE — Telephone Encounter (Signed)
plz phone in. 

## 2015-03-15 NOTE — Telephone Encounter (Signed)
I received a refill request for Tramadol HCL 50mg  tabs. Last refilled 07/23/14 for #120 with 3 refills. Last office visit 08/09/14 but has appt with Dr. Danise Mina tomorrow. Ok to refill?

## 2015-03-15 NOTE — Telephone Encounter (Signed)
Will need written script to fax to [facility]. Printed and in your IN box for signature.

## 2015-03-15 NOTE — Telephone Encounter (Signed)
Faxed to pharmacy

## 2015-03-16 ENCOUNTER — Encounter: Payer: Self-pay | Admitting: Radiology

## 2015-03-16 ENCOUNTER — Ambulatory Visit (INDEPENDENT_AMBULATORY_CARE_PROVIDER_SITE_OTHER): Payer: Medicare HMO | Admitting: Family Medicine

## 2015-03-16 ENCOUNTER — Encounter: Payer: Self-pay | Admitting: Family Medicine

## 2015-03-16 VITALS — BP 110/80 | HR 52 | Temp 98.0°F | Ht 61.75 in | Wt 184.2 lb

## 2015-03-16 DIAGNOSIS — F32A Depression, unspecified: Secondary | ICD-10-CM

## 2015-03-16 DIAGNOSIS — E785 Hyperlipidemia, unspecified: Secondary | ICD-10-CM

## 2015-03-16 DIAGNOSIS — Z0000 Encounter for general adult medical examination without abnormal findings: Secondary | ICD-10-CM

## 2015-03-16 DIAGNOSIS — M47812 Spondylosis without myelopathy or radiculopathy, cervical region: Secondary | ICD-10-CM | POA: Diagnosis not present

## 2015-03-16 DIAGNOSIS — R739 Hyperglycemia, unspecified: Secondary | ICD-10-CM

## 2015-03-16 DIAGNOSIS — N183 Chronic kidney disease, stage 3 unspecified: Secondary | ICD-10-CM

## 2015-03-16 DIAGNOSIS — K219 Gastro-esophageal reflux disease without esophagitis: Secondary | ICD-10-CM | POA: Diagnosis not present

## 2015-03-16 DIAGNOSIS — Z23 Encounter for immunization: Secondary | ICD-10-CM | POA: Diagnosis not present

## 2015-03-16 DIAGNOSIS — F329 Major depressive disorder, single episode, unspecified: Secondary | ICD-10-CM

## 2015-03-16 DIAGNOSIS — E669 Obesity, unspecified: Secondary | ICD-10-CM

## 2015-03-16 DIAGNOSIS — N951 Menopausal and female climacteric states: Secondary | ICD-10-CM

## 2015-03-16 DIAGNOSIS — I10 Essential (primary) hypertension: Secondary | ICD-10-CM

## 2015-03-16 DIAGNOSIS — M79604 Pain in right leg: Secondary | ICD-10-CM | POA: Insufficient documentation

## 2015-03-16 DIAGNOSIS — Z7189 Other specified counseling: Secondary | ICD-10-CM | POA: Insufficient documentation

## 2015-03-16 LAB — VITAMIN D 25 HYDROXY (VIT D DEFICIENCY, FRACTURES): VITD: 21.48 ng/mL — ABNORMAL LOW (ref 30.00–100.00)

## 2015-03-16 LAB — LIPID PANEL
Cholesterol: 250 mg/dL — ABNORMAL HIGH (ref 0–200)
HDL: 46.2 mg/dL (ref >39.00)
NonHDL: 204.29
Total CHOL/HDL Ratio: 5
Triglycerides: 233 mg/dL — ABNORMAL HIGH (ref 0.0–149.0)
VLDL: 46.6 mg/dL — ABNORMAL HIGH (ref 0.0–40.0)

## 2015-03-16 LAB — CBC WITH DIFFERENTIAL/PLATELET
Basophils Absolute: 0 10*3/uL (ref 0.0–0.1)
Basophils Relative: 0.4 % (ref 0.0–3.0)
Eosinophils Absolute: 0.1 10*3/uL (ref 0.0–0.7)
Eosinophils Relative: 3.3 % (ref 0.0–5.0)
HCT: 33.3 % — ABNORMAL LOW (ref 36.0–46.0)
Hemoglobin: 11.2 g/dL — ABNORMAL LOW (ref 12.0–15.0)
Lymphocytes Relative: 34.7 % (ref 12.0–46.0)
Lymphs Abs: 1.5 10*3/uL (ref 0.7–4.0)
MCHC: 33.6 g/dL (ref 30.0–36.0)
MCV: 89.9 fl (ref 78.0–100.0)
Monocytes Absolute: 0.2 10*3/uL (ref 0.1–1.0)
Monocytes Relative: 4.6 % (ref 3.0–12.0)
Neutro Abs: 2.4 10*3/uL (ref 1.4–7.7)
Neutrophils Relative %: 57 % (ref 43.0–77.0)
Platelets: 176 10*3/uL (ref 150.0–400.0)
RBC: 3.71 Mil/uL — ABNORMAL LOW (ref 3.87–5.11)
RDW: 13.8 % (ref 11.5–15.5)
WBC: 4.2 10*3/uL (ref 4.0–10.5)

## 2015-03-16 LAB — COMPREHENSIVE METABOLIC PANEL
ALT: 11 U/L (ref 0–35)
AST: 18 U/L (ref 0–37)
Albumin: 4.4 g/dL (ref 3.5–5.2)
Alkaline Phosphatase: 33 U/L — ABNORMAL LOW (ref 39–117)
BUN: 24 mg/dL — ABNORMAL HIGH (ref 6–23)
CO2: 28 mEq/L (ref 19–32)
Calcium: 9.6 mg/dL (ref 8.4–10.5)
Chloride: 103 mEq/L (ref 96–112)
Creatinine, Ser: 1.12 mg/dL (ref 0.40–1.20)
GFR: 51.44 mL/min — ABNORMAL LOW (ref >60.00)
Glucose, Bld: 87 mg/dL (ref 70–99)
Potassium: 4.6 mEq/L (ref 3.5–5.1)
Sodium: 137 mEq/L (ref 135–145)
Total Bilirubin: 0.4 mg/dL (ref 0.2–1.2)
Total Protein: 7.5 g/dL (ref 6.0–8.3)

## 2015-03-16 LAB — LDL CHOLESTEROL, DIRECT: Direct LDL: 159 mg/dL

## 2015-03-16 LAB — TSH: TSH: 2.97 u[IU]/mL (ref 0.35–4.50)

## 2015-03-16 NOTE — Assessment & Plan Note (Signed)
S/p ESI by [facility]

## 2015-03-16 NOTE — Assessment & Plan Note (Signed)
Anticipate IT band syndrome vs R sciatica. Provided with IT band syndrome exercises. If Cr stable, consider NSAID course.

## 2015-03-16 NOTE — Assessment & Plan Note (Signed)
Encouraged healthy diet and lifestyle changes for sustainable weight loss.  

## 2015-03-16 NOTE — Patient Instructions (Addendum)
Advanced directive packet provided today. Flu shot today. labwork today, then we may consider decreasing blood pressure medicine. For right leg pain - either ilitoibial band irritation or possibly radiation of pain coming from lumbar spine. labwork today then we will discuss medication for this. Continue tramadol for now - try exercises provided today. Return 1 year for next medicare wellness visit  Health Maintenance Adopting a healthy lifestyle and getting preventive care can go a long way to promote health and wellness. Talk with your health care provider about what schedule of regular examinations is right for you. This is a good chance for you to check in with your provider about disease prevention and staying healthy. In between checkups, there are plenty of things you can do on your own. Experts have done a lot of research about which lifestyle changes and preventive measures are most likely to keep you healthy. Ask your health care provider for more information. WEIGHT AND DIET  Eat a healthy diet  Be sure to include plenty of vegetables, fruits, low-fat dairy products, and lean protein.  Do not eat a lot of foods high in solid fats, added sugars, or salt.  Get regular exercise. This is one of the most important things you can do for your health.  Most adults should exercise for at least 150 minutes each week. The exercise should increase your heart rate and make you sweat (moderate-intensity exercise).  Most adults should also do strengthening exercises at least twice a week. This is in addition to the moderate-intensity exercise.  Maintain a healthy weight  Body mass index (BMI) is a measurement that can be used to identify possible weight problems. It estimates body fat based on height and weight. Your health care provider can help determine your BMI and help you achieve or maintain a healthy weight.  For females 80 years of age and older:   A BMI below 18.5 is considered  underweight.  A BMI of 18.5 to 24.9 is normal.  A BMI of 25 to 29.9 is considered overweight.  A BMI of 30 and above is considered obese.  Watch levels of cholesterol and blood lipids  You should start having your blood tested for lipids and cholesterol at 68 years of age, then have this test every 5 years.  You may need to have your cholesterol levels checked more often if:  Your lipid or cholesterol levels are high.  You are older than 68 years of age.  You are at high risk for heart disease.  CANCER SCREENING   Lung Cancer  Lung cancer screening is recommended for adults 95-30 years old who are at high risk for lung cancer because of a history of smoking.  A yearly low-dose CT scan of the lungs is recommended for people who:  Currently smoke.  Have quit within the past 15 years.  Have at least a 30-pack-year history of smoking. A pack year is smoking an average of one pack of cigarettes a day for 1 year.  Yearly screening should continue until it has been 15 years since you quit.  Yearly screening should stop if you develop a health problem that would prevent you from having lung cancer treatment.  Breast Cancer  Practice breast self-awareness. This means understanding how your breasts normally appear and feel.  It also means doing regular breast self-exams. Let your health care provider know about any changes, no matter how small.  If you are in your 20s or 30s, you should have a  clinical breast exam (CBE) by a health care provider every 1-3 years as part of a regular health exam.  If you are 60 or older, have a CBE every year. Also consider having a breast X-ray (mammogram) every year.  If you have a family history of breast cancer, talk to your health care provider about genetic screening.  If you are at high risk for breast cancer, talk to your health care provider about having an MRI and a mammogram every year.  Breast cancer gene (BRCA) assessment is  recommended for women who have family members with BRCA-related cancers. BRCA-related cancers include:  Breast.  Ovarian.  Tubal.  Peritoneal cancers.  Results of the assessment will determine the need for genetic counseling and BRCA1 and BRCA2 testing. Cervical Cancer Routine pelvic examinations to screen for cervical cancer are no longer recommended for nonpregnant women who are considered low risk for cancer of the pelvic organs (ovaries, uterus, and vagina) and who do not have symptoms. A pelvic examination may be necessary if you have symptoms including those associated with pelvic infections. Ask your health care provider if a screening pelvic exam is right for you.   The Pap test is the screening test for cervical cancer for women who are considered at risk.  If you had a hysterectomy for a problem that was not cancer or a condition that could lead to cancer, then you no longer need Pap tests.  If you are older than 65 years, and you have had normal Pap tests for the past 10 years, you no longer need to have Pap tests.  If you have had past treatment for cervical cancer or a condition that could lead to cancer, you need Pap tests and screening for cancer for at least 20 years after your treatment.  If you no longer get a Pap test, assess your risk factors if they change (such as having a new sexual partner). This can affect whether you should start being screened again.  Some women have medical problems that increase their chance of getting cervical cancer. If this is the case for you, your health care provider may recommend more frequent screening and Pap tests.  The human papillomavirus (HPV) test is another test that may be used for cervical cancer screening. The HPV test looks for the virus that can cause cell changes in the cervix. The cells collected during the Pap test can be tested for HPV.  The HPV test can be used to screen women 56 years of age and older. Getting tested  for HPV can extend the interval between normal Pap tests from three to five years.  An HPV test also should be used to screen women of any age who have unclear Pap test results.  After 68 years of age, women should have HPV testing as often as Pap tests.  Colorectal Cancer  This type of cancer can be detected and often prevented.  Routine colorectal cancer screening usually begins at 68 years of age and continues through 68 years of age.  Your health care provider may recommend screening at an earlier age if you have risk factors for colon cancer.  Your health care provider may also recommend using home test kits to check for hidden blood in the stool.  A small camera at the end of a tube can be used to examine your colon directly (sigmoidoscopy or colonoscopy). This is done to check for the earliest forms of colorectal cancer.  Routine screening usually begins at  age 27.  Direct examination of the colon should be repeated every 5-10 years through 68 years of age. However, you may need to be screened more often if early forms of precancerous polyps or small growths are found. Skin Cancer  Check your skin from head to toe regularly.  Tell your health care provider about any new moles or changes in moles, especially if there is a change in a mole's shape or color.  Also tell your health care provider if you have a mole that is larger than the size of a pencil eraser.  Always use sunscreen. Apply sunscreen liberally and repeatedly throughout the day.  Protect yourself by wearing long sleeves, pants, a wide-brimmed hat, and sunglasses whenever you are outside. HEART DISEASE, DIABETES, AND HIGH BLOOD PRESSURE   Have your blood pressure checked at least every 1-2 years. High blood pressure causes heart disease and increases the risk of stroke.  If you are between 38 years and 48 years old, ask your health care provider if you should take aspirin to prevent strokes.  Have regular  diabetes screenings. This involves taking a blood sample to check your fasting blood sugar level.  If you are at a normal weight and have a low risk for diabetes, have this test once every three years after 68 years of age.  If you are overweight and have a high risk for diabetes, consider being tested at a younger age or more often. PREVENTING INFECTION  Hepatitis B  If you have a higher risk for hepatitis B, you should be screened for this virus. You are considered at high risk for hepatitis B if:  You were born in a country where hepatitis B is common. Ask your health care provider which countries are considered high risk.  Your parents were born in a high-risk country, and you have not been immunized against hepatitis B (hepatitis B vaccine).  You have HIV or AIDS.  You use needles to inject street drugs.  You live with someone who has hepatitis B.  You have had sex with someone who has hepatitis B.  You get hemodialysis treatment.  You take certain medicines for conditions, including cancer, organ transplantation, and autoimmune conditions. Hepatitis C  Blood testing is recommended for:  Everyone born from [birth year] through 1964.  Anyone with known risk factors for hepatitis C. Sexually transmitted infections (STIs)  You should be screened for sexually transmitted infections (STIs) including gonorrhea and chlamydia if:  You are sexually active and are younger than 68 years of age.  You are older than 68 years of age and your health care provider tells you that you are at risk for this type of infection.  Your sexual activity has changed since you were last screened and you are at an increased risk for chlamydia or gonorrhea. Ask your health care provider if you are at risk.  If you do not have HIV, but are at risk, it may be recommended that you take a prescription medicine daily to prevent HIV infection. This is called pre-exposure prophylaxis (PrEP). You are considered at  risk if:  You are sexually active and do not regularly use condoms or know the HIV status of your partner(s).  You take drugs by injection.  You are sexually active with a partner who has HIV. Talk with your health care provider about whether you are at high risk of being infected with HIV. If you choose to begin PrEP, you should first be tested for HIV. You  should then be tested every 3 months for as long as you are taking PrEP.  PREGNANCY   If you are premenopausal and you may become pregnant, ask your health care provider about preconception counseling.  If you may become pregnant, take 400 to 800 micrograms (mcg) of folic acid every day.  If you want to prevent pregnancy, talk to your health care provider about birth control (contraception). OSTEOPOROSIS AND MENOPAUSE   Osteoporosis is a disease in which the bones lose minerals and strength with aging. This can result in serious bone fractures. Your risk for osteoporosis can be identified using a bone density scan.  If you are 53 years of age or older, or if you are at risk for osteoporosis and fractures, ask your health care provider if you should be screened.  Ask your health care provider whether you should take a calcium or vitamin D supplement to lower your risk for osteoporosis.  Menopause may have certain physical symptoms and risks.  Hormone replacement therapy may reduce some of these symptoms and risks. Talk to your health care provider about whether hormone replacement therapy is right for you.  HOME CARE INSTRUCTIONS   Schedule regular health, dental, and eye exams.  Stay current with your immunizations.   Do not use any tobacco products including cigarettes, chewing tobacco, or electronic cigarettes.  If you are pregnant, do not drink alcohol.  If you are breastfeeding, limit how much and how often you drink alcohol.  Limit alcohol intake to no more than 1 drink per day for nonpregnant women. One drink equals  12 ounces of beer, 5 ounces of wine, or 1 ounces of hard liquor.  Do not use street drugs.  Do not share needles.  Ask your health care provider for help if you need support or information about quitting drugs.  Tell your health care provider if you often feel depressed.  Tell your health care provider if you have ever been abused or do not feel safe at home. Document Released: 01/15/2011 Document Revised: 11/16/2013 Document Reviewed: 06/03/2013 [facility] Patient Information 2015 [place], [place]. This information is not intended to replace advice given to you by your health care provider. Make sure you discuss any questions you have with your health care provider.

## 2015-03-16 NOTE — Assessment & Plan Note (Signed)
Recheck today. 

## 2015-03-16 NOTE — Addendum Note (Signed)
Addended by: Ria Bush on: 03/16/2015 11:35 AM   Modules accepted: Orders, SmartSet

## 2015-03-16 NOTE — Assessment & Plan Note (Signed)
Continue protonix daily. 

## 2015-03-16 NOTE — Progress Notes (Addendum)
BP 110/80 mmHg  Pulse 52  Temp(Src) 98 F (36.7 C) (Oral)  Ht 5' 1.75" (1.568 m)  Wt 184 lb 4 oz (83.575 kg)  BMI 33.99 kg/m2   CC: medicare wellness visit  Subjective:    Patient ID: Ann White, female    DOB: [birth year], 69 y.o.   MRN: [id number]  HPI: Ann White is a 69 y.o. female presenting on 03/16/2015 for Annual Exam   Having some R lateral leg/hip pain ongoing for last several months, worse over last few days. Points to lateral knee as site of pain.  Estradiol patch 0.075mg /week since hysterectomy 2000. Interested in gradual taper off.  Vision screen - yearly with eye doctor Hearing screen - passed Denies falls in last year  Denies depression, anhedonia.   Preventative: Colon cancer screening - colonoscopy 06/2013 mod diverticulosis, 3 tubular adenomas, int hem rpt 5 yrs [facility] Plan) Well woman - with prior PCP Dr. Rocky Link. S/p hysterectomy and oophorectomy for benign reason. 1990s.  Mammogram - 09/2014  DEXA 2013 - WNL Flu shot 04/2013 Pneumovax - 2014. prevnar 08/2013 Tdap 2014 Shingles shot 2012.  Advanced directives: does not have set up. Thinks would want husband to be HCPOA then daughter. Seat belt use discussed Sunscreen use discussed. No changing moles on skin  Lives with husband and 2 cats. 2 grown children, 5 grandchildren.  Occupation: retired, worked in [employer]. Activity: no regular exercise. Does walk in summer  Diet: fruits/vegetables daily, good water.  Relevant past medical, surgical, family and social history reviewed and updated as indicated. Interim medical history since our last visit reviewed. Allergies and medications reviewed and updated. Current Outpatient Prescriptions on File Prior to Visit  Medication Sig  . Acetaminophen (ARTHRITIS PAIN RELIEF PO) Take by mouth.  . Biotin 5000 MCG CAPS Take by mouth.  . clonazePAM (KLONOPIN) 1 MG tablet Take 1 tablet (1 mg total) by mouth 2 (two) times daily as needed.  Mariane Baumgarten Calcium (STOOL  SOFTENER PO) Take by mouth daily.   Marland Kitchen estradiol (CLIMARA - DOSED IN MG/24 HR) 0.075 mg/24hr patch APPLY 1 PATCH ONTO THE SKIN ONCE EVERY WEEK (SCHEDULE WELLNESS VISIT)  . fluticasone (FLONASE) 50 MCG/ACT nasal spray Place 2 sprays into the nose daily.  Marland Kitchen gabapentin (NEURONTIN) 300 MG capsule Take 1 capsule (300 mg total) by mouth at bedtime.  Marland Kitchen lisinopril-hydrochlorothiazide (PRINZIDE,ZESTORETIC) 10-12.5 MG per tablet Take 1 tablet by mouth daily.  Marland Kitchen MAGNESIUM CITRATE PO Take by mouth.  . Misc Natural Products (SINUS FORMULA PO) Take by mouth.  . Multiple Vitamin (MULTIVITAMIN) tablet Take 1 tablet by mouth daily.  . Omega-3 Fatty Acids (FISH OIL) 1000 MG CAPS Take one by mouth two times a day  . pantoprazole (PROTONIX) 40 MG tablet Take 1 tablet (40 mg total) by mouth 2 (two) times daily.  . Red Yeast Rice 600 MG CAPS Take 1 capsule by mouth 2 (two) times daily.  . sertraline (ZOLOFT) 100 MG tablet TAKE 1 TABLET DAILY (SCHEDULE WELLNESS VISIT)  . traMADol (ULTRAM) 50 MG tablet Take 1 tablet (50 mg total) by mouth 3 (three) times daily as needed for moderate pain.   No current facility-administered medications on file prior to visit.    Review of Systems Per HPI unless specifically indicated above     Objective:    BP 110/80 mmHg  Pulse 52  Temp(Src) 98 F (36.7 C) (Oral)  Ht 5' 1.75" (1.568 m)  Wt 184 lb 4 oz (83.575 kg)  BMI 33.99  kg/m2  Wt Readings from Last 3 Encounters:  03/16/15 184 lb 4 oz (83.575 kg)  08/09/14 185 lb 12.8 oz (84.278 kg)  08/28/13 192 lb (87.091 kg)    Physical Exam  Constitutional: She is oriented to person, place, and time. She appears well-developed and well-nourished. No distress.  HENT:  Head: Normocephalic and atraumatic.  Right Ear: Hearing, tympanic membrane, external ear and ear canal normal.  Left Ear: Hearing, tympanic membrane, external ear and ear canal normal.  Nose: Nose normal.  Mouth/Throat: Uvula is midline, oropharynx is clear and  moist and mucous membranes are normal. No oropharyngeal exudate, posterior oropharyngeal edema or posterior oropharyngeal erythema.  Eyes: Conjunctivae and EOM are normal. Pupils are equal, round, and reactive to light. No scleral icterus.  Neck: Normal range of motion. Neck supple. Carotid bruit is not present. No thyromegaly present.  Cardiovascular: Normal rate, regular rhythm, normal heart sounds and intact distal pulses.   No murmur heard. Pulses:      Radial pulses are 2+ on the right side, and 2+ on the left side.  Pulmonary/Chest: Effort normal and breath sounds normal. No respiratory distress. She has no wheezes. She has no rales.  Abdominal: Soft. Bowel sounds are normal. She exhibits no distension and no mass. There is no tenderness. There is no rebound and no guarding.  Musculoskeletal: Normal range of motion. She exhibits no edema.  L leg WNL R leg - tender at lateral knee and at sciatic notch. Negative SLR, no pain with FABER or with int/ ext rotation at hip.  Lymphadenopathy:    She has no cervical adenopathy.  Neurological: She is alert and oriented to person, place, and time.  CN grossly intact, station and gait intact  Skin: Skin is warm and dry. No rash noted.  Psychiatric: She has a normal mood and affect. Her behavior is normal. Judgment and thought content normal.  Nursing note and vitals reviewed.  Results for orders placed or performed in visit on 10/11/14  [facility] MAMMOGRAPHY  Result Value Ref Range   HM Mammogram Normal Birads 1-Repeat 1 year        Assessment & Plan:   Problem List Items Addressed This Visit    Cervical spondylosis without myelopathy    S/p ESI by [facility]      HTN (hypertension)    Chronic, stable. Continue meds. Ideal goal <130/80 given CKD. May stop hctz component of combo pill if labs stable.      Relevant Orders   TSH   GERD (gastroesophageal reflux disease)    Continue protonix daily.      Depression    Stable on  sertraline.      HLD (hyperlipidemia)    Check labs today. On RYR and fish oil. Intolerant to statins      Relevant Orders   Lipid panel   TSH   Medicare annual wellness visit, subsequent - Primary    I have personally reviewed the Medicare Annual Wellness questionnaire and have noted 1. The patient's medical and social history 2. Their use of alcohol, tobacco or illicit drugs 3. Their current medications and supplements 4. The patient's functional ability including ADL's, fall risks, home safety risks and hearing or visual impairment. Cognitive function has been assessed and addressed as indicated.  5. Diet and physical activity 6. Evidence for depression or mood disorders The patients weight, height, BMI have been recorded in the chart. I have made referrals, counseling and provided education to the patient based on  review of the above and I have provided the pt with a written personalized care plan for preventive services. Provider list updated.. See scanned questionairre as needed for further documentation. Reviewed preventative protocols and updated unless pt declined.       Hyperglycemia    Recheck today.      Hot flash, menopausal    Discussed slow taper off estrogen. Will retrial this when refill due (likely send in 0.05mg  dose).      Obesity, Class I, BMI 30-34.9    Encouraged healthy diet and lifestyle changes for sustainable weight loss.      CKD (chronic kidney disease) stage 3, GFR 30-59 ml/min    Recheck labs today.      Relevant Orders   Comprehensive metabolic panel   CBC with Differential/Platelet   Vit D  25 hydroxy (rtn osteoporosis monitoring)   Advanced care planning/counseling discussion    Advanced directives: does not have set up. Thinks would want husband to be HCPOA then daughter.      Right leg pain    Anticipate IT band syndrome vs R sciatica. Provided with IT band syndrome exercises. If Cr stable, consider NSAID course.        Other  Visit Diagnoses    Need for prophylactic vaccination and inoculation against influenza        Relevant Orders    Flu Vaccine QUAD 36+ mos PF IM (Fluarix & Fluzone Quad PF) (Completed)        Follow up plan: Return in about 1 year (around 03/15/2016), or as needed, for medicare wellness.

## 2015-03-16 NOTE — Assessment & Plan Note (Signed)
Discussed slow taper off estrogen. Will retrial this when refill due (likely send in 0.05mg  dose).

## 2015-03-16 NOTE — Assessment & Plan Note (Signed)
Chronic, stable. Continue meds. Ideal goal <130/80 given CKD. May stop hctz component of combo pill if labs stable.

## 2015-03-16 NOTE — Assessment & Plan Note (Addendum)

## 2015-03-16 NOTE — Assessment & Plan Note (Signed)
Stable on sertraline.

## 2015-03-16 NOTE — Assessment & Plan Note (Signed)
Check labs today. On RYR and fish oil. Intolerant to statins

## 2015-03-16 NOTE — Progress Notes (Signed)
Pre visit review using our clinic review tool, if applicable. No additional management support is needed unless otherwise documented below in the visit note. 

## 2015-03-16 NOTE — Assessment & Plan Note (Signed)
Recheck labs today. 

## 2015-03-16 NOTE — Assessment & Plan Note (Signed)
Advanced directives: does not have set up. Thinks would want husband to be HCPOA then daughter.

## 2015-03-17 ENCOUNTER — Other Ambulatory Visit: Payer: Self-pay | Admitting: Family Medicine

## 2015-03-17 MED ORDER — LISINOPRIL 10 MG PO TABS: 10.0000 mg | ORAL_TABLET | Freq: Every day | ORAL | Status: DC

## 2015-03-17 MED ORDER — VITAMIN D3 25 MCG (1000 UT) PO CAPS: 1.0000 | ORAL_CAPSULE | Freq: Every day | ORAL | Status: DC

## 2015-03-23 ENCOUNTER — Telehealth: Payer: Self-pay | Admitting: *Deleted

## 2015-03-23 NOTE — Telephone Encounter (Signed)
Patient called her insurance about the zetia and said that it was going to cost ~$40/month. She said she just couldn't afford that right now and asked if there was anything else you could think of.

## 2015-03-24 MED ORDER — FENOFIBRATE 145 MG PO TABS: 145.0000 mg | ORAL_TABLET | Freq: Every day | ORAL | Status: DC

## 2015-03-24 NOTE — Telephone Encounter (Signed)
Ok let's try fenofibrate (tricor) sent to pharmacy. This is also different from statins and will help triglycerides. Have sent in to her [facility] locally.

## 2015-03-25 NOTE — Telephone Encounter (Signed)
Patient notified and said Rx was much more affordable at $4.

## 2015-04-18 ENCOUNTER — Encounter: Payer: Self-pay | Admitting: Family Medicine

## 2015-04-28 ENCOUNTER — Other Ambulatory Visit: Payer: Self-pay | Admitting: Family Medicine

## 2015-04-29 ENCOUNTER — Other Ambulatory Visit: Payer: Self-pay | Admitting: Neurosurgery

## 2015-04-29 DIAGNOSIS — M4722 Other spondylosis with radiculopathy, cervical region: Secondary | ICD-10-CM

## 2015-05-04 ENCOUNTER — Ambulatory Visit
Admission: RE | Admit: 2015-05-04 | Discharge: 2015-05-04 | Disposition: A | Discharge status: Home / Self Care | Payer: Medicare HMO | Source: Ambulatory Visit | Attending: Neurosurgery | Admitting: Neurosurgery

## 2015-05-04 DIAGNOSIS — M4722 Other spondylosis with radiculopathy, cervical region: Secondary | ICD-10-CM

## 2015-05-04 DIAGNOSIS — M47812 Spondylosis without myelopathy or radiculopathy, cervical region: Secondary | ICD-10-CM | POA: Diagnosis not present

## 2015-05-04 MED ORDER — IOHEXOL 300 MG/ML  SOLN
1.0000 mL | Freq: Once | INTRAMUSCULAR | Status: DC | PRN
  Administered 2015-05-04: 1 mL via EPIDURAL

## 2015-05-04 MED ORDER — TRIAMCINOLONE ACETONIDE 40 MG/ML IJ SUSP (RADIOLOGY)
60.0000 mg | Freq: Once | INTRAMUSCULAR | Status: AC
  Administered 2015-05-04: 60 mg via EPIDURAL

## 2015-05-16 ENCOUNTER — Encounter: Payer: Self-pay | Admitting: Family Medicine

## 2015-05-16 ENCOUNTER — Ambulatory Visit (INDEPENDENT_AMBULATORY_CARE_PROVIDER_SITE_OTHER): Payer: Medicare HMO | Admitting: Family Medicine

## 2015-05-16 VITALS — BP 124/78 | HR 72 | Temp 98.3°F | Wt 182.5 lb

## 2015-05-16 DIAGNOSIS — R1030 Lower abdominal pain, unspecified: Secondary | ICD-10-CM | POA: Diagnosis not present

## 2015-05-16 DIAGNOSIS — R109 Unspecified abdominal pain: Secondary | ICD-10-CM | POA: Diagnosis not present

## 2015-05-16 LAB — POCT URINALYSIS DIPSTICK
Bilirubin, UA: NEGATIVE
Blood, UA: NEGATIVE
Glucose, UA: NEGATIVE
Ketones, UA: NEGATIVE
Leukocytes, UA: NEGATIVE
Nitrite, UA: NEGATIVE
Protein, UA: NEGATIVE
Spec Grav, UA: 1.025
Urobilinogen, UA: 0.2
pH, UA: 6

## 2015-05-16 MED ORDER — METRONIDAZOLE 500 MG PO TABS: 500.0000 mg | ORAL_TABLET | Freq: Three times a day (TID) | ORAL | Status: DC

## 2015-05-16 MED ORDER — CIPROFLOXACIN HCL 250 MG PO TABS: 250.0000 mg | ORAL_TABLET | Freq: Two times a day (BID) | ORAL | Status: DC

## 2015-05-16 MED ORDER — FLUCONAZOLE 150 MG PO TABS: 150.0000 mg | ORAL_TABLET | Freq: Once | ORAL | Status: DC

## 2015-05-16 NOTE — Progress Notes (Signed)
Pre visit review using our clinic review tool, if applicable. No additional management support is needed unless otherwise documented below in the visit note. 

## 2015-05-16 NOTE — Assessment & Plan Note (Addendum)
Typical UTI sxs but UA normal. Regardless will send UCx today.  Suspect diverticulitis in known diverticulosis on recent colonoscopy with lower abd pain and BM changes (constipation) over last few days - check CMP, CBC ,lipase and start cipro/flagyl. Advised avoid EtOH while on flagyl. Discussed red flags to seek urgent care or notify us if sxs fail to improve with treatment. Pt agrees with plan.

## 2015-05-16 NOTE — Patient Instructions (Addendum)
Urine looking ok. Culture sent regardless. I worry about diverticulitis - treat with cipro/flagyl course. Blood work today looking for infection If not improving with treatment, please let us know right away  Diverticulitis Diverticulitis is inflammation or infection of small pouches in your colon that form when you have a condition called diverticulosis. The pouches in your colon are called diverticula. Your colon, or large intestine, is where water is absorbed and stool is formed. Complications of diverticulitis can include:  Bleeding.  Severe infection.  Severe pain.  Perforation of your colon.  Obstruction of your colon. CAUSES  Diverticulitis is caused by bacteria. Diverticulitis happens when stool becomes trapped in diverticula. This allows bacteria to grow in the diverticula, which can lead to inflammation and infection. RISK FACTORS People with diverticulosis are at risk for diverticulitis. Eating a diet that does not include enough fiber from fruits and vegetables may make diverticulitis more likely to develop. SYMPTOMS  Symptoms of diverticulitis may include:  Abdominal pain and tenderness. The pain is normally located on the left side of the abdomen, but may occur in other areas.  Fever and chills.  Bloating.  Cramping.  Nausea.  Vomiting.  Constipation.  Diarrhea.  Blood in your stool. DIAGNOSIS  Your health care provider will ask you about your medical history and do a physical exam. You may need to have tests done because many medical conditions can cause the same symptoms as diverticulitis. Tests may include:  Blood tests.  Urine tests.  Imaging tests of the abdomen, including X-rays and CT scans. When your condition is under control, your health care provider may recommend that you have a colonoscopy. A colonoscopy can show how severe your diverticula are and whether something else is causing your symptoms. TREATMENT  Most cases of diverticulitis  are mild and can be treated at home. Treatment may include:  Taking over-the-counter pain medicines.  Following a clear liquid diet.  Taking antibiotic medicines by mouth for 7-10 days. More severe cases may be treated at a hospital. Treatment may include:  Not eating or drinking.  Taking prescription pain medicine.  Receiving antibiotic medicines through an IV tube.  Receiving fluids and nutrition through an IV tube.  Surgery. HOME CARE INSTRUCTIONS   Follow your health care provider's instructions carefully.  Follow a full liquid diet or other diet as directed by your health care provider. After your symptoms improve, your health care provider may tell you to change your diet. He or she may recommend you eat a high-fiber diet. Fruits and vegetables are good sources of fiber. Fiber makes it easier to pass stool.  Take fiber supplements or probiotics as directed by your health care provider.  Only take medicines as directed by your health care provider.  Keep all your follow-up appointments. SEEK MEDICAL CARE IF:   Your pain does not improve.  You have a hard time eating food.  Your bowel movements do not return to normal. SEEK IMMEDIATE MEDICAL CARE IF:   Your pain becomes worse.  Your symptoms do not get better.  Your symptoms suddenly get worse.  You have a fever.  You have repeated vomiting.  You have bloody or black, tarry stools. MAKE SURE YOU:   Understand these instructions.  Will watch your condition.  Will get help right away if you are not doing well or get worse.   This information is not intended to replace advice given to you by your health care provider. Make sure you discuss any questions you  have with your health care provider.   Document Released: 04/11/2005 Document Revised: 07/07/2013 Document Reviewed: 05/27/2013 Elsevier Interactive Patient Education [employer].

## 2015-05-16 NOTE — Progress Notes (Signed)
BP 124/78 mmHg  Pulse 72  Temp(Src) 98.3 F (36.8 C) (Oral)  Wt 182 lb 8 oz (82.781 kg)   CC: lower abd pain  Subjective:    Patient ID: Ann White, female    DOB: [birth year], 69 y.o.   MRN: [id number]  HPI: [name] is a 69 y.o. female presenting on 05/16/2015 for Urinary Tract Infection   3-4d h/o lower abd pain/pressure with some dysuria, frequency and urgency. Drinking water well. Intermittent constipation without diarrhea.  No fevers/chills, nausea/vomiting, hematuria, or back pain or flank pain. No boring pain to the back. No dyspnea or cough.  Tried cystex. Also taking tylenol No recent abx. No recent UTI.  No h/o kidney stones + h/o IBS.  Colonoscopy with mod diverticulosis. Decreased portion sizes.   Relevant past medical, surgical, family and social history reviewed and updated as indicated. Interim medical history since our last visit reviewed. Allergies and medications reviewed and updated. Current Outpatient Prescriptions on File Prior to Visit  Medication Sig  . Acetaminophen (ARTHRITIS PAIN RELIEF PO) Take by mouth.  . Biotin 5000 MCG CAPS Take by mouth.  . Cholecalciferol (VITAMIN D3) 1000 UNITS CAPS Take 1 capsule (1,000 Units total) by mouth daily.  . clonazePAM (KLONOPIN) 1 MG tablet Take 1 tablet (1 mg total) by mouth 2 (two) times daily as needed.  Mariane Baumgarten Calcium (STOOL SOFTENER PO) Take by mouth daily.   Marland Kitchen estradiol (CLIMARA - DOSED IN MG/24 HR) 0.075 mg/24hr patch APPLY 1 PATCH ONTO THE SKIN ONCE EVERY WEEK (SCHEDULE WELLNESS VISIT)  . fenofibrate (TRICOR) 145 MG tablet Take 1 tablet (145 mg total) by mouth daily.  . fluticasone (FLONASE) 50 MCG/ACT nasal spray Place 2 sprays into the nose daily.  Marland Kitchen gabapentin (NEURONTIN) 300 MG capsule Take 1 capsule (300 mg total) by mouth at bedtime.  Marland Kitchen lisinopril (PRINIVIL,ZESTRIL) 10 MG tablet Take 1 tablet (10 mg total) by mouth daily.  Marland Kitchen MAGNESIUM CITRATE PO Take by mouth.  . Misc Natural Products  (SINUS FORMULA PO) Take by mouth.  . Multiple Vitamin (MULTIVITAMIN) tablet Take 1 tablet by mouth daily.  . Omega-3 Fatty Acids (FISH OIL) 1000 MG CAPS Take one by mouth two times a day  . pantoprazole (PROTONIX) 40 MG tablet Take 1 tablet (40 mg total) by mouth 2 (two) times daily.  . Red Yeast Rice 600 MG CAPS Take 1 capsule by mouth 2 (two) times daily.  . sertraline (ZOLOFT) 100 MG tablet Take 1 tablet (100 mg total) by mouth daily.  . traMADol (ULTRAM) 50 MG tablet Take 1 tablet (50 mg total) by mouth 3 (three) times daily as needed for moderate pain.   No current facility-administered medications on file prior to visit.    Review of Systems Per HPI unless specifically indicated in ROS section     Objective:    BP 124/78 mmHg  Pulse 72  Temp(Src) 98.3 F (36.8 C) (Oral)  Wt 182 lb 8 oz (82.781 kg)  Wt Readings from Last 3 Encounters:  05/16/15 182 lb 8 oz (82.781 kg)  03/16/15 184 lb 4 oz (83.575 kg)  08/09/14 185 lb 12.8 oz (84.278 kg)    Physical Exam  Constitutional: She appears well-developed and well-nourished. No distress.  HENT:  Mouth/Throat: Oropharynx is clear and moist. No oropharyngeal exudate.  Abdominal: Soft. Normal appearance and bowel sounds are normal. She exhibits no distension and no mass. There is no hepatosplenomegaly. There is tenderness (mod) in the right upper quadrant,  right lower quadrant, epigastric area, suprapubic area, left upper quadrant and left lower quadrant. There is guarding. There is no rebound, no CVA tenderness and negative Murphy's sign.  Musculoskeletal: She exhibits no edema.  Skin: Skin is warm and dry. No rash noted.  Nursing note and vitals reviewed.  Results for orders placed or performed in visit on 05/16/15  POCT Urinalysis Dipstick  Result Value Ref Range   Color, UA Yellow    Clarity, UA Clear    Glucose, UA Negative    Bilirubin, UA Negative    Ketones, UA Negative    Spec Grav, UA 1.025    Blood, UA Negative     pH, UA 6.0    Protein, UA Negative    Urobilinogen, UA 0.2    Nitrite, UA Negative    Leukocytes, UA Negative Negative      Assessment & Plan:   Problem List Items Addressed This Visit    Lower abdominal pain - Primary    Typical UTI sxs but UA normal. Regardless will send UCx today.  Suspect diverticulitis in known diverticulosis on recent colonoscopy with lower abd pain and BM changes (constipation) over last few days - check CMP, CBC ,lipase and start cipro/flagyl. Advised avoid EtOH while on flagyl. Discussed red flags to seek urgent care or notify us if sxs fail to improve with treatment. Pt agrees with plan.      Relevant Orders   POCT Urinalysis Dipstick (Completed)   Comprehensive metabolic panel   CBC with Differential/Platelet   Lipase   Urine culture       Follow up plan: No Follow-up on file.

## 2015-05-17 LAB — CBC WITH DIFFERENTIAL/PLATELET
Basophils Absolute: 0 10*3/uL (ref 0.0–0.1)
Basophils Relative: 0.7 % (ref 0.0–3.0)
Eosinophils Absolute: 0.2 10*3/uL (ref 0.0–0.7)
Eosinophils Relative: 2.8 % (ref 0.0–5.0)
HCT: 33.3 % — ABNORMAL LOW (ref 36.0–46.0)
Hemoglobin: 10.9 g/dL — ABNORMAL LOW (ref 12.0–15.0)
Lymphocytes Relative: 24.4 % (ref 12.0–46.0)
Lymphs Abs: 1.4 10*3/uL (ref 0.7–4.0)
MCHC: 32.8 g/dL (ref 30.0–36.0)
MCV: 90.3 fl (ref 78.0–100.0)
Monocytes Absolute: 0.3 10*3/uL (ref 0.1–1.0)
Monocytes Relative: 4.5 % (ref 3.0–12.0)
Neutro Abs: 3.9 10*3/uL (ref 1.4–7.7)
Neutrophils Relative %: 67.6 % (ref 43.0–77.0)
Platelets: 248 10*3/uL (ref 150.0–400.0)
RBC: 3.69 Mil/uL — ABNORMAL LOW (ref 3.87–5.11)
RDW: 13.6 % (ref 11.5–15.5)
WBC: 5.8 10*3/uL (ref 4.0–10.5)

## 2015-05-17 LAB — COMPREHENSIVE METABOLIC PANEL
ALT: 17 U/L (ref 0–35)
AST: 27 U/L (ref 0–37)
Albumin: 4.6 g/dL (ref 3.5–5.2)
Alkaline Phosphatase: 21 U/L — ABNORMAL LOW (ref 39–117)
BUN: 38 mg/dL — ABNORMAL HIGH (ref 6–23)
CO2: 27 mEq/L (ref 19–32)
Calcium: 10.1 mg/dL (ref 8.4–10.5)
Chloride: 101 mEq/L (ref 96–112)
Creatinine, Ser: 1.64 mg/dL — ABNORMAL HIGH (ref 0.40–1.20)
GFR: 33.11 mL/min — ABNORMAL LOW (ref >60.00)
Glucose, Bld: 84 mg/dL (ref 70–99)
Potassium: 5.2 mEq/L — ABNORMAL HIGH (ref 3.5–5.1)
Sodium: 136 mEq/L (ref 135–145)
Total Bilirubin: 0.4 mg/dL (ref 0.2–1.2)
Total Protein: 8 g/dL (ref 6.0–8.3)

## 2015-05-17 LAB — URINE CULTURE: Colony Count: 9000

## 2015-05-17 LAB — LIPASE: Lipase: 7 U/L — ABNORMAL LOW (ref 11.0–59.0)

## 2015-05-19 ENCOUNTER — Other Ambulatory Visit: Payer: Self-pay | Admitting: *Deleted

## 2015-05-19 NOTE — Telephone Encounter (Signed)
Ok to refill? Will need written script to fax.

## 2015-05-20 MED ORDER — TRAMADOL HCL 50 MG PO TABS: 50.0000 mg | ORAL_TABLET | Freq: Three times a day (TID) | ORAL | Status: DC | PRN

## 2015-05-20 NOTE — Telephone Encounter (Signed)
Filled by Anda Kraft and faxed to [facility].

## 2015-05-20 NOTE — Telephone Encounter (Signed)
I did not get to this yesterday. Will see if Allie Bossier willing to fill.

## 2015-05-26 ENCOUNTER — Encounter: Payer: Self-pay | Admitting: Family Medicine

## 2015-05-26 ENCOUNTER — Ambulatory Visit (INDEPENDENT_AMBULATORY_CARE_PROVIDER_SITE_OTHER): Payer: Medicare HMO | Admitting: Family Medicine

## 2015-05-26 VITALS — BP 106/62 | HR 54 | Temp 98.8°F | Wt 181.5 lb

## 2015-05-26 DIAGNOSIS — R5381 Other malaise: Secondary | ICD-10-CM | POA: Insufficient documentation

## 2015-05-26 DIAGNOSIS — N183 Chronic kidney disease, stage 3 unspecified: Secondary | ICD-10-CM

## 2015-05-26 DIAGNOSIS — R5383 Other fatigue: Secondary | ICD-10-CM | POA: Diagnosis not present

## 2015-05-26 DIAGNOSIS — R1030 Lower abdominal pain, unspecified: Secondary | ICD-10-CM

## 2015-05-26 DIAGNOSIS — M79609 Pain in unspecified limb: Secondary | ICD-10-CM | POA: Diagnosis not present

## 2015-05-26 DIAGNOSIS — M79604 Pain in right leg: Secondary | ICD-10-CM

## 2015-05-26 NOTE — Progress Notes (Signed)
Pre visit review using our clinic review tool, if applicable. No additional management support is needed unless otherwise documented below in the visit note. 

## 2015-05-26 NOTE — Progress Notes (Signed)
BP 106/62 mmHg  Pulse 54  Temp(Src) 98.8 F (37.1 C) (Oral)  Wt 181 lb 8 oz (82.328 kg)  SpO2 99%   CC: congestion  Subjective:    Patient ID: Ann White, female    DOB: 1945-08-29, 69 y.o.   MRN: [id number]  HPI: ADYSSON WARHURST is a 69 y.o. female presenting on 05/26/2015 for Nasal Congestion   1d h/o malaise, head>chest congestion, hoarse voice, muffled in ears, coughing all night long. Some PNdrainage and ST initially now better. +fatigue. Just came back from beach.   No fevers/chills, ear pain or tooth pain, nausea. No leg swelling, dyspnea, or palpitations, constipation. No sick contacts recently.   Recently treated for presumed diverticulitis with cipro/flagyl. UCx with insignificant growth. labwork showed mild renal insuff with Cr 1.12 --> 1.64. Mild persistent abd discomfort and diarrhea. She has been taking immodium for this. Continues protonix BID.  bp and pulse noted low today - only on lisinopril 10mg  daily.  BP Readings from Last 3 Encounters:  05/26/15 106/62  05/16/15 124/78  05/04/15 151/68    R lower leg pain popliteal area that radiates down leg, ongoing for last several months. No swelling. She is taking estrogen patch intermittently. No h/o blood clots. No fmhx blood clots. Recent prolonged car rides.   Relevant past medical, surgical, family and social history reviewed and updated as indicated. Interim medical history since our last visit reviewed. Allergies and medications reviewed and updated. Current Outpatient Prescriptions on File Prior to Visit  Medication Sig  . Acetaminophen (ARTHRITIS PAIN RELIEF PO) Take by mouth.  . Biotin 5000 MCG CAPS Take by mouth.  . Cholecalciferol (VITAMIN D3) 1000 UNITS CAPS Take 1 capsule (1,000 Units total) by mouth daily.  . clonazePAM (KLONOPIN) 1 MG tablet Take 1 tablet (1 mg total) by mouth 2 (two) times daily as needed.  Mariane Baumgarten Calcium (STOOL SOFTENER PO) Take by mouth daily.   Marland Kitchen estradiol (CLIMARA - DOSED IN  MG/24 HR) 0.075 mg/24hr patch APPLY 1 PATCH ONTO THE SKIN ONCE EVERY WEEK (SCHEDULE WELLNESS VISIT)  . fenofibrate (TRICOR) 145 MG tablet Take 1 tablet (145 mg total) by mouth daily.  . fluticasone (FLONASE) 50 MCG/ACT nasal spray Place 2 sprays into the nose daily.  Marland Kitchen gabapentin (NEURONTIN) 300 MG capsule Take 1 capsule (300 mg total) by mouth at bedtime.  Marland Kitchen lisinopril (PRINIVIL,ZESTRIL) 10 MG tablet Take 1 tablet (10 mg total) by mouth daily.  Marland Kitchen MAGNESIUM CITRATE PO Take by mouth.  . Misc Natural Products (SINUS FORMULA PO) Take by mouth.  . Multiple Vitamin (MULTIVITAMIN) tablet Take 1 tablet by mouth daily.  . Omega-3 Fatty Acids (FISH OIL) 1000 MG CAPS Take one by mouth two times a day  . pantoprazole (PROTONIX) 40 MG tablet Take 1 tablet (40 mg total) by mouth 2 (two) times daily.  . Red Yeast Rice 600 MG CAPS Take 1 capsule by mouth 2 (two) times daily.  . sertraline (ZOLOFT) 100 MG tablet Take 1 tablet (100 mg total) by mouth daily.  . traMADol (ULTRAM) 50 MG tablet Take 1 tablet (50 mg total) by mouth 3 (three) times daily as needed for moderate pain.   No current facility-administered medications on file prior to visit.    Review of Systems Per HPI unless specifically indicated in ROS section     Objective:    BP 106/62 mmHg  Pulse 54  Temp(Src) 98.8 F (37.1 C) (Oral)  Wt 181 lb 8 oz (82.328 kg)  SpO2 99%  Wt Readings from Last 3 Encounters:  05/26/15 181 lb 8 oz (82.328 kg)  05/16/15 182 lb 8 oz (82.781 kg)  03/16/15 184 lb 4 oz (83.575 kg)    Physical Exam  Constitutional: She appears well-developed and well-nourished. No distress.  HENT:  Mouth/Throat: Oropharynx is clear and moist. No oropharyngeal exudate.  Eyes: Conjunctivae and EOM are normal. Pupils are equal, round, and reactive to light. No scleral icterus.  Cardiovascular: Normal rate, regular rhythm, normal heart sounds and intact distal pulses.   No murmur heard. Pulmonary/Chest: Effort normal and  breath sounds normal. No respiratory distress. She has no wheezes. She has no rales.  Abdominal: Soft. Normal appearance and bowel sounds are normal. She exhibits no distension and no mass. There is no hepatosplenomegaly. There is tenderness (mild) in the left lower quadrant. There is no rigidity, no rebound, no guarding and negative Murphy's sign.  Musculoskeletal: She exhibits no edema.  No palpable cords  Lymphadenopathy:    She has no cervical adenopathy.  Skin: Skin is warm and dry. No rash noted.  Psychiatric: She has a normal mood and affect.  Nursing note and vitals reviewed.     Assessment & Plan:   Problem List Items Addressed This Visit    Right leg pain    Ongoing over last 1-2 months. She is on estrogen and has had recent prolonged car rides. Check D dimer today. Denies chest pain or dyspnea, O2 sat 99% today.       Relevant Orders   D-dimer, quantitative (not at [facility])   Malaise and fatigue - Primary    Unclear etiology, possibly just viral illness after recent beach trip. Advised increased water and rest. With recent ARF and hyperkalemia and low BP today, rec she stop lisinopril until feeling better, monitor bp at home. Pt agrees with plan.      Relevant Orders   Renal function panel   Lower abdominal pain    Finished cipro/flagyl course for presumed diverticulitis although CBC was stable. Persistent mild lower abd discomfort and diarrhea, slowly improving.       CKD (chronic kidney disease) stage 3, GFR 30-59 ml/min   Relevant Orders   Renal function panel       Follow up plan: Return if symptoms worsen or fail to improve.

## 2015-05-26 NOTE — Assessment & Plan Note (Signed)
Unclear etiology, possibly just viral illness after recent beach trip. Advised increased water and rest. With recent ARF and hyperkalemia and low BP today, rec she stop lisinopril until feeling better, monitor bp at home. Pt agrees with plan.

## 2015-05-26 NOTE — Patient Instructions (Addendum)
Stop lisinopril and monitor blood pressures - and restart if they start creeping up again.  labwork today. We will call you with results. Push water and rest.

## 2015-05-26 NOTE — Assessment & Plan Note (Signed)
Ongoing over last 1-2 months. She is on estrogen and has had recent prolonged car rides. Check D dimer today. Denies chest pain or dyspnea, O2 sat 99% today.

## 2015-05-26 NOTE — Assessment & Plan Note (Signed)
Finished cipro/flagyl course for presumed diverticulitis although CBC was stable. Persistent mild lower abd discomfort and diarrhea, slowly improving.

## 2015-05-27 ENCOUNTER — Telehealth: Payer: Self-pay | Admitting: *Deleted

## 2015-05-27 ENCOUNTER — Ambulatory Visit
Admission: RE | Admit: 2015-05-27 | Discharge: 2015-05-27 | Disposition: A | Discharge status: Home / Self Care | Payer: Medicare HMO | Source: Ambulatory Visit | Attending: Family Medicine | Admitting: Family Medicine

## 2015-05-27 ENCOUNTER — Other Ambulatory Visit: Payer: Self-pay | Admitting: *Deleted

## 2015-05-27 ENCOUNTER — Other Ambulatory Visit: Payer: Self-pay | Admitting: Family Medicine

## 2015-05-27 DIAGNOSIS — I82491 Acute embolism and thrombosis of other specified deep vein of right lower extremity: Secondary | ICD-10-CM | POA: Diagnosis not present

## 2015-05-27 DIAGNOSIS — M79604 Pain in right leg: Secondary | ICD-10-CM | POA: Diagnosis present

## 2015-05-27 DIAGNOSIS — I82441 Acute embolism and thrombosis of right tibial vein: Secondary | ICD-10-CM

## 2015-05-27 DIAGNOSIS — M79605 Pain in left leg: Secondary | ICD-10-CM | POA: Diagnosis not present

## 2015-05-27 DIAGNOSIS — Z5181 Encounter for therapeutic drug level monitoring: Secondary | ICD-10-CM

## 2015-05-27 LAB — RENAL FUNCTION PANEL
Albumin: 4.4 g/dL (ref 3.5–5.2)
BUN: 30 mg/dL — ABNORMAL HIGH (ref 6–23)
CO2: 28 mEq/L (ref 19–32)
Calcium: 9.9 mg/dL (ref 8.4–10.5)
Chloride: 101 mEq/L (ref 96–112)
Creatinine, Ser: 1.78 mg/dL — ABNORMAL HIGH (ref 0.40–1.20)
GFR: 30.12 mL/min — ABNORMAL LOW (ref >60.00)
Glucose, Bld: 85 mg/dL (ref 70–99)
Phosphorus: 3.3 mg/dL (ref 2.3–4.6)
Potassium: 5.1 mEq/L (ref 3.5–5.1)
Sodium: 136 mEq/L (ref 135–145)

## 2015-05-27 LAB — D-DIMER, QUANTITATIVE: D-Dimer, Quant: 3.03 ug/mL-FEU — ABNORMAL HIGH (ref 0.00–0.48)

## 2015-05-27 MED ORDER — WARFARIN SODIUM 5 MG PO TABS: 5.0000 mg | ORAL_TABLET | Freq: Every day | ORAL | Status: DC

## 2015-05-27 MED ORDER — ENOXAPARIN SODIUM 80 MG/0.8ML ~~LOC~~ SOLN: 80.0000 mg | SUBCUTANEOUS | Status: DC

## 2015-05-27 NOTE — Addendum Note (Signed)
Addended by: Ria Bush on: 05/27/2015 01:27 PM   Modules accepted: Orders

## 2015-05-27 NOTE — Telephone Encounter (Signed)
Noted. Agree.  Lab Results  Component Value Date   CREATININE 1.78* 05/26/2015

## 2015-05-27 NOTE — Telephone Encounter (Signed)
Call report from [facility] at [facility] u/s for venous u/s of right lower leg:  IMPRESSION: There is acute deep venous thrombosis in a posterior tibial vein and in the peroneal vein. No deep venous thrombosis is seen at or above the knee. There is superficial venous thrombosis throughout the visualized lesser saphenous vein. There is also acute thrombus in the gastrocnemius veins. No thrombus is appreciable in the greater saphenous vein or at the saphenous femoral junction. Note that the left common femoral vein is patent.  Dr. Danise Mina notified.  Spoke to patient to give results and instructions.  Coumadin 5 mg daily, Lovenox injections 1mg /kg/day.  Prescriptions called to pharmacy.  INR check scheduled for 11/14.

## 2015-05-28 ENCOUNTER — Other Ambulatory Visit: Payer: Self-pay | Admitting: Family Medicine

## 2015-05-28 DIAGNOSIS — R1030 Lower abdominal pain, unspecified: Secondary | ICD-10-CM

## 2015-05-28 DIAGNOSIS — R5381 Other malaise: Secondary | ICD-10-CM

## 2015-05-28 DIAGNOSIS — R5383 Other fatigue: Secondary | ICD-10-CM

## 2015-05-28 DIAGNOSIS — N179 Acute kidney failure, unspecified: Secondary | ICD-10-CM

## 2015-05-30 ENCOUNTER — Ambulatory Visit (INDEPENDENT_AMBULATORY_CARE_PROVIDER_SITE_OTHER): Payer: Medicare HMO | Admitting: *Deleted

## 2015-05-30 DIAGNOSIS — Z86718 Personal history of other venous thrombosis and embolism: Secondary | ICD-10-CM | POA: Insufficient documentation

## 2015-05-30 DIAGNOSIS — I824Y1 Acute embolism and thrombosis of unspecified deep veins of right proximal lower extremity: Secondary | ICD-10-CM | POA: Diagnosis not present

## 2015-05-30 DIAGNOSIS — Z5181 Encounter for therapeutic drug level monitoring: Secondary | ICD-10-CM | POA: Diagnosis not present

## 2015-05-30 LAB — POCT INR: INR: 2.1

## 2015-05-30 NOTE — Progress Notes (Signed)
Can we have her return Wed or Thurs to recheck? Likely 5mg  daily is too high dose/

## 2015-05-30 NOTE — Progress Notes (Signed)
Contacted patient to rescheduled recheck.  Appointment moved to Thursday 11/17 at [place].

## 2015-05-30 NOTE — Progress Notes (Signed)
Pre visit review using our clinic review tool, if applicable. No additional management support is needed unless otherwise documented below in the visit note. 

## 2015-06-02 ENCOUNTER — Ambulatory Visit (INDEPENDENT_AMBULATORY_CARE_PROVIDER_SITE_OTHER): Payer: Medicare HMO | Admitting: *Deleted

## 2015-06-02 ENCOUNTER — Encounter (INDEPENDENT_AMBULATORY_CARE_PROVIDER_SITE_OTHER): Payer: Self-pay

## 2015-06-02 DIAGNOSIS — Z5181 Encounter for therapeutic drug level monitoring: Secondary | ICD-10-CM

## 2015-06-02 DIAGNOSIS — I824Y1 Acute embolism and thrombosis of unspecified deep veins of right proximal lower extremity: Secondary | ICD-10-CM | POA: Diagnosis not present

## 2015-06-02 DIAGNOSIS — I82441 Acute embolism and thrombosis of right tibial vein: Secondary | ICD-10-CM

## 2015-06-02 LAB — POCT INR: INR: 2.5

## 2015-06-02 NOTE — Progress Notes (Signed)
Pre visit review using our clinic review tool, if applicable. No additional management support is needed unless otherwise documented below in the visit note. 

## 2015-06-03 ENCOUNTER — Ambulatory Visit
Admission: RE | Admit: 2015-06-03 | Discharge: 2015-06-03 | Disposition: A | Discharge status: Home / Self Care | Payer: Medicare HMO | Source: Ambulatory Visit | Attending: Family Medicine | Admitting: Family Medicine

## 2015-06-03 ENCOUNTER — Other Ambulatory Visit: Payer: Self-pay | Admitting: *Deleted

## 2015-06-03 DIAGNOSIS — R109 Unspecified abdominal pain: Secondary | ICD-10-CM | POA: Diagnosis not present

## 2015-06-03 DIAGNOSIS — N179 Acute kidney failure, unspecified: Secondary | ICD-10-CM | POA: Diagnosis not present

## 2015-06-03 DIAGNOSIS — R5383 Other fatigue: Secondary | ICD-10-CM | POA: Diagnosis not present

## 2015-06-03 DIAGNOSIS — R1030 Lower abdominal pain, unspecified: Secondary | ICD-10-CM | POA: Diagnosis not present

## 2015-06-03 DIAGNOSIS — R5381 Other malaise: Secondary | ICD-10-CM

## 2015-06-03 MED ORDER — CLONAZEPAM 1 MG PO TABS: 1.0000 mg | ORAL_TABLET | Freq: Two times a day (BID) | ORAL | Status: DC | PRN

## 2015-06-03 NOTE — Telephone Encounter (Signed)
Rx faxed to [facility].

## 2015-06-03 NOTE — Telephone Encounter (Signed)
Printed and in Kim's box 

## 2015-06-03 NOTE — Telephone Encounter (Signed)
Ok to refill to mail order? Will need written Rx. 

## 2015-06-06 ENCOUNTER — Encounter: Payer: Self-pay | Admitting: Family Medicine

## 2015-06-06 DIAGNOSIS — I714 Abdominal aortic aneurysm, without rupture, unspecified: Secondary | ICD-10-CM | POA: Insufficient documentation

## 2015-06-06 DIAGNOSIS — I7143 Infrarenal abdominal aortic aneurysm, without rupture: Secondary | ICD-10-CM | POA: Insufficient documentation

## 2015-06-06 DIAGNOSIS — I77811 Abdominal aortic ectasia: Secondary | ICD-10-CM | POA: Insufficient documentation

## 2015-06-07 ENCOUNTER — Ambulatory Visit: Payer: Medicare HMO

## 2015-06-13 ENCOUNTER — Ambulatory Visit (INDEPENDENT_AMBULATORY_CARE_PROVIDER_SITE_OTHER): Payer: Medicare HMO | Admitting: *Deleted

## 2015-06-13 ENCOUNTER — Telehealth: Payer: Self-pay | Admitting: Family Medicine

## 2015-06-13 DIAGNOSIS — I824Y1 Acute embolism and thrombosis of unspecified deep veins of right proximal lower extremity: Secondary | ICD-10-CM | POA: Diagnosis not present

## 2015-06-13 DIAGNOSIS — Z5181 Encounter for therapeutic drug level monitoring: Secondary | ICD-10-CM | POA: Diagnosis not present

## 2015-06-13 DIAGNOSIS — I82441 Acute embolism and thrombosis of right tibial vein: Secondary | ICD-10-CM | POA: Diagnosis not present

## 2015-06-13 LAB — PROTIME-INR
INR: 7 ratio (ref 0.8–1.0)
Prothrombin Time: 73.8 s (ref 9.6–13.1)

## 2015-06-13 LAB — POCT INR: INR: 7

## 2015-06-13 NOTE — Progress Notes (Signed)
Pre visit review using our clinic review tool, if applicable. No additional management support is needed unless otherwise documented below in the visit note. 

## 2015-06-13 NOTE — Telephone Encounter (Signed)
Patient called to get her Protime results.

## 2015-06-13 NOTE — Telephone Encounter (Signed)
Pt called checking on her protime results and who  medication

## 2015-06-13 NOTE — Telephone Encounter (Signed)
Call report from Santiago Glad at lab - INR 7.0 - see anti-coagulation encounter.

## 2015-06-13 NOTE — Telephone Encounter (Signed)
Still no results from the lab.  I contacted lab, they will call once resulted.  Patient aware.

## 2015-06-21 ENCOUNTER — Other Ambulatory Visit: Payer: Self-pay | Admitting: *Deleted

## 2015-06-21 ENCOUNTER — Ambulatory Visit (INDEPENDENT_AMBULATORY_CARE_PROVIDER_SITE_OTHER): Payer: Medicare HMO | Admitting: *Deleted

## 2015-06-21 DIAGNOSIS — I82441 Acute embolism and thrombosis of right tibial vein: Secondary | ICD-10-CM

## 2015-06-21 DIAGNOSIS — I824Y1 Acute embolism and thrombosis of unspecified deep veins of right proximal lower extremity: Secondary | ICD-10-CM | POA: Diagnosis not present

## 2015-06-21 DIAGNOSIS — Z5181 Encounter for therapeutic drug level monitoring: Secondary | ICD-10-CM

## 2015-06-21 LAB — POCT INR: INR: 1.3

## 2015-06-21 NOTE — Progress Notes (Signed)
Pre visit review using our clinic review tool, if applicable. No additional management support is needed unless otherwise documented below in the visit note. 

## 2015-06-22 ENCOUNTER — Other Ambulatory Visit: Payer: Self-pay | Admitting: Family Medicine

## 2015-06-27 ENCOUNTER — Ambulatory Visit (INDEPENDENT_AMBULATORY_CARE_PROVIDER_SITE_OTHER): Payer: Medicare HMO | Admitting: *Deleted

## 2015-06-27 DIAGNOSIS — I824Y1 Acute embolism and thrombosis of unspecified deep veins of right proximal lower extremity: Secondary | ICD-10-CM | POA: Diagnosis not present

## 2015-06-27 DIAGNOSIS — Z5181 Encounter for therapeutic drug level monitoring: Secondary | ICD-10-CM

## 2015-06-27 DIAGNOSIS — I82441 Acute embolism and thrombosis of right tibial vein: Secondary | ICD-10-CM | POA: Diagnosis not present

## 2015-06-27 LAB — POCT INR: INR: 2.1

## 2015-06-27 NOTE — Progress Notes (Signed)
Pre visit review using our clinic review tool, if applicable. No additional management support is needed unless otherwise documented below in the visit note. 

## 2015-06-29 ENCOUNTER — Encounter: Payer: Self-pay | Admitting: Family Medicine

## 2015-06-29 ENCOUNTER — Ambulatory Visit (INDEPENDENT_AMBULATORY_CARE_PROVIDER_SITE_OTHER): Payer: Medicare HMO | Admitting: Family Medicine

## 2015-06-29 VITALS — BP 124/78 | HR 60 | Temp 97.9°F | Wt 175.8 lb

## 2015-06-29 DIAGNOSIS — D631 Anemia in chronic kidney disease: Secondary | ICD-10-CM | POA: Insufficient documentation

## 2015-06-29 DIAGNOSIS — I824Z1 Acute embolism and thrombosis of unspecified deep veins of right distal lower extremity: Secondary | ICD-10-CM | POA: Diagnosis not present

## 2015-06-29 DIAGNOSIS — I10 Essential (primary) hypertension: Secondary | ICD-10-CM | POA: Diagnosis not present

## 2015-06-29 DIAGNOSIS — N183 Chronic kidney disease, stage 3 unspecified: Secondary | ICD-10-CM

## 2015-06-29 DIAGNOSIS — J320 Chronic maxillary sinusitis: Secondary | ICD-10-CM

## 2015-06-29 DIAGNOSIS — D649 Anemia, unspecified: Secondary | ICD-10-CM

## 2015-06-29 LAB — CBC WITH DIFFERENTIAL/PLATELET
Basophils Absolute: 0 10*3/uL (ref 0.0–0.1)
Basophils Relative: 0.5 % (ref 0.0–3.0)
Eosinophils Absolute: 0.2 10*3/uL (ref 0.0–0.7)
Eosinophils Relative: 5.8 % — ABNORMAL HIGH (ref 0.0–5.0)
HCT: 33.3 % — ABNORMAL LOW (ref 36.0–46.0)
Hemoglobin: 11 g/dL — ABNORMAL LOW (ref 12.0–15.0)
Lymphocytes Relative: 25.8 % (ref 12.0–46.0)
Lymphs Abs: 1.1 10*3/uL (ref 0.7–4.0)
MCHC: 33.1 g/dL (ref 30.0–36.0)
MCV: 88.6 fl (ref 78.0–100.0)
Monocytes Absolute: 0.2 10*3/uL (ref 0.1–1.0)
Monocytes Relative: 5.8 % (ref 3.0–12.0)
Neutro Abs: 2.6 10*3/uL (ref 1.4–7.7)
Neutrophils Relative %: 62.1 % (ref 43.0–77.0)
Platelets: 253 10*3/uL (ref 150.0–400.0)
RBC: 3.76 Mil/uL — ABNORMAL LOW (ref 3.87–5.11)
RDW: 14.1 % (ref 11.5–15.5)
WBC: 4.2 10*3/uL (ref 4.0–10.5)

## 2015-06-29 LAB — RENAL FUNCTION PANEL
Albumin: 4.5 g/dL (ref 3.5–5.2)
BUN: 25 mg/dL — ABNORMAL HIGH (ref 6–23)
CO2: 27 mEq/L (ref 19–32)
Calcium: 9.6 mg/dL (ref 8.4–10.5)
Chloride: 104 mEq/L (ref 96–112)
Creatinine, Ser: 1.37 mg/dL — ABNORMAL HIGH (ref 0.40–1.20)
GFR: 40.73 mL/min — ABNORMAL LOW (ref >60.00)
Glucose, Bld: 83 mg/dL (ref 70–99)
Phosphorus: 3.8 mg/dL (ref 2.3–4.6)
Potassium: 4.1 mEq/L (ref 3.5–5.1)
Sodium: 141 mEq/L (ref 135–145)

## 2015-06-29 LAB — IBC PANEL
Iron: 70 ug/dL (ref 42–145)
Saturation Ratios: 14 % — ABNORMAL LOW (ref 20.0–50.0)
Transferrin: 357 mg/dL (ref 212.0–360.0)

## 2015-06-29 LAB — TSH: TSH: 2.95 u[IU]/mL (ref 0.35–4.50)

## 2015-06-29 LAB — MICROALBUMIN / CREATININE URINE RATIO
Creatinine,U: 166.8 mg/dL
Microalb Creat Ratio: 1.4 mg/g (ref 0.0–30.0)
Microalb, Ur: 2.3 mg/dL — ABNORMAL HIGH (ref 0.0–1.9)

## 2015-06-29 LAB — VITAMIN B12: Vitamin B-12: 266 pg/mL (ref 211–911)

## 2015-06-29 LAB — VITAMIN D 25 HYDROXY (VIT D DEFICIENCY, FRACTURES): VITD: 32.79 ng/mL (ref 30.00–100.00)

## 2015-06-29 LAB — FOLATE: Folate: 23 ng/mL (ref >5.9)

## 2015-06-29 LAB — FERRITIN: Ferritin: 121.7 ng/mL (ref 10.0–291.0)

## 2015-06-29 MED ORDER — AMOXICILLIN-POT CLAVULANATE 500-125 MG PO TABS: 1.0000 | ORAL_TABLET | Freq: Two times a day (BID) | ORAL | Status: DC

## 2015-06-29 NOTE — Progress Notes (Signed)
BP 124/78 mmHg  Pulse 60  Temp(Src) 97.9 F (36.6 C) (Oral)  Wt 175 lb 12 oz (79.72 kg)   CC: DVT f/u visit  Subjective:    Patient ID: Ann White, female    DOB: 1946/05/03, 69 y.o.   MRN: [id number]  HPI: Ann White is a 69 y.o. female presenting on 06/29/2015 for Follow-up   See prior note for details.  05/2015 seen here with malaise and R leg pain and swelling after prolonged car ride - found to have acute distal DVT and started on coumadin with lovenox bridge. Has established with our coumadin clinic. On estrogen patch intermittently. S/p TAH with BSO 1990s for fibroids with dysmenorrhea.   Lab Results  Component Value Date   INR 2.1 06/27/2015   INR 1.3 06/21/2015   INR 7.0 Repeated and verified X2.* 06/13/2015    UTD colon cancer screening, breast cancer screening. Non smoker. No fmhx cancer. UA without hematuria 04/2015. Denies fevers/chills, persistent malaise, unexpected weight changes, or night sweats. No abd pain, diarrhea, nausea. No swollen glands. No hematuria or blood in stool. She has decreased portion sizes and lost 6 lbs!   Persistent sinus congestion predominantly right maxillary with facial pain/pressure. Some better but ongoing for last several months. Productive yellow mucous when blowing her nose.   Acute renal insufficiency over last few months - abdominal ultrasound showed normal kidneys, no other abnormality. She is off lisinopril for the last month. BP remains stable. She stays hydrated.   Relevant past medical, surgical, family and social history reviewed and updated as indicated. Interim medical history since our last visit reviewed. Allergies and medications reviewed and updated. Current Outpatient Prescriptions on File Prior to Visit  Medication Sig  . Acetaminophen (ARTHRITIS PAIN RELIEF PO) Take by mouth.  . Biotin 5000 MCG CAPS Take by mouth.  . Cholecalciferol (VITAMIN D3) 1000 UNITS CAPS Take 1 capsule (1,000 Units total) by mouth daily.    . clonazePAM (KLONOPIN) 1 MG tablet Take 1 tablet (1 mg total) by mouth 2 (two) times daily as needed.  Mariane Baumgarten Calcium (STOOL SOFTENER PO) Take by mouth daily.   . fenofibrate (TRICOR) 145 MG tablet Take 1 tablet (145 mg total) by mouth daily.  . fluticasone (FLONASE) 50 MCG/ACT nasal spray Place 2 sprays into the nose daily.  Marland Kitchen gabapentin (NEURONTIN) 300 MG capsule Take 1 capsule (300 mg total) by mouth at bedtime.  Marland Kitchen MAGNESIUM CITRATE PO Take by mouth.  . Misc Natural Products (SINUS FORMULA PO) Take by mouth.  . Multiple Vitamin (MULTIVITAMIN) tablet Take 1 tablet by mouth daily.  . Omega-3 Fatty Acids (FISH OIL) 1000 MG CAPS Take one by mouth two times a day  . pantoprazole (PROTONIX) 40 MG tablet Take 1 tablet (40 mg total) by mouth 2 (two) times daily.  . Red Yeast Rice 600 MG CAPS Take 1 capsule by mouth 2 (two) times daily.  . sertraline (ZOLOFT) 100 MG tablet Take 1 tablet (100 mg total) by mouth daily.  . traMADol (ULTRAM) 50 MG tablet Take 1 tablet (50 mg total) by mouth 3 (three) times daily as needed for moderate pain.  Marland Kitchen warfarin (COUMADIN) 5 MG tablet Take as directed by anti-coagulation clinic.   No current facility-administered medications on file prior to visit.    Review of Systems Per HPI unless specifically indicated in ROS section     Objective:    BP 124/78 mmHg  Pulse 60  Temp(Src) 97.9 F (36.6 C) (  Oral)  Wt 175 lb 12 oz (79.72 kg)  Wt Readings from Last 3 Encounters:  06/29/15 175 lb 12 oz (79.72 kg)  05/26/15 181 lb 8 oz (82.328 kg)  05/16/15 182 lb 8 oz (82.781 kg)    Physical Exam  Constitutional: She appears well-developed and well-nourished. No distress.  HENT:  Head: Normocephalic and atraumatic.  Right Ear: Hearing, tympanic membrane, external ear and ear canal normal.  Left Ear: Hearing, tympanic membrane, external ear and ear canal normal.  Nose: Mucosal edema present. No rhinorrhea. Right sinus exhibits maxillary sinus tenderness.  Right sinus exhibits no frontal sinus tenderness. Left sinus exhibits no maxillary sinus tenderness and no frontal sinus tenderness.  Mouth/Throat: Uvula is midline, oropharynx is clear and moist and mucous membranes are normal. No oropharyngeal exudate, posterior oropharyngeal edema, posterior oropharyngeal erythema or tonsillar abscesses.  R>L nasal mucosal erythema  Eyes: Conjunctivae and EOM are normal. Pupils are equal, round, and reactive to light. No scleral icterus.  Neck: Normal range of motion. Neck supple.  Cardiovascular: Normal rate, regular rhythm, normal heart sounds and intact distal pulses.   No murmur heard. Pulmonary/Chest: Effort normal and breath sounds normal. No respiratory distress. She has no wheezes. She has no rales.  Musculoskeletal: She exhibits no edema.  No popliteal fullness of palpable cords  Lymphadenopathy:    She has no cervical adenopathy.  Skin: Skin is warm and dry. No rash noted.  Psychiatric: She has a normal mood and affect.  Nursing note and vitals reviewed.  Results for orders placed or performed in visit on 06/27/15  POCT INR  Result Value Ref Range   INR 2.1    RIGHT LOWER EXTREMITY VENOUS DUPLEX ULTRASOUND IMPRESSION: There is acute deep venous thrombosis in a posterior tibial vein and in the peroneal vein. No deep venous thrombosis is seen at or above the knee. There is superficial venous thrombosis throughout the visualized lesser saphenous vein. There is also acute thrombus in the gastrocnemius veins. No thrombus is appreciable in the greater saphenous vein or at the saphenous femoral junction. Note that the left common femoral vein is patent.  These results will be called to the ordering clinician or representative by the Radiologist Assistant, and communication documented in the PACS or zVision Dashboard.  Electronically Signed  By: Lowella Grip III M.D.  On: 05/27/2015 15:23    Assessment & Plan:   Problem List Items  Addressed This Visit    HTN (hypertension)    bp actually stable off lisinopril - will discontinue. Check renal panel today.      Relevant Orders   TSH   CKD (chronic kidney disease) stage 3, GFR 30-59 ml/min    Recheck today, further evaluate with microalb and SPEP.  She is on protonix 40mg  BID - consider decreasing if GERD stable.       Relevant Orders   CBC with Differential/Platelet   Renal function panel   TSH   Microalbumin / creatinine urine ratio   VITAMIN D 25 Hydroxy (Vit-D Deficiency, Fractures)   Serum protein electrophoresis with reflex   Chronic right maxillary sinusitis    Prolonged sxs, treat with 2 wk augmentin course. If persistent sxs, low threshold to refer to ENT for further evaluation. Pt agrees with plan. Will send in 500mg  augmentin/clavulanate      Relevant Medications   amoxicillin-clavulanate (AUGMENTIN) 500-125 MG tablet   Anemia, unspecified    ?anemia of chronic kidney disease. Further eval with anemia panel (ferritin, iron panel, folate and  b12).       Acute venous embolism and thrombosis of deep vessels of distal lower extremity ([place]) - Primary    First provoked symptomatic distal acute DVT - recommend anticoagulation for at least 3 months. Pt has stopped estrogen therapy which she takes for hot flashes. Discussed taking breaks during prolonged car rides. Continue coumadin with goal INR 2-3, through mid [place] for full 3 months, then reassess need and desire to continue. F/u then.      Relevant Orders   CBC with Differential/Platelet       Follow up plan: Return as needed, for follow up visit.

## 2015-06-29 NOTE — Assessment & Plan Note (Addendum)
Recheck today, further evaluate with microalb and SPEP.  She is on protonix 40mg  BID - consider decreasing if GERD stable.

## 2015-06-29 NOTE — Assessment & Plan Note (Signed)
bp actually stable off lisinopril - will discontinue. Check renal panel today.

## 2015-06-29 NOTE — Assessment & Plan Note (Signed)
First provoked symptomatic distal acute DVT - recommend anticoagulation for at least 3 months. Pt has stopped estrogen therapy which she takes for hot flashes. Discussed taking breaks during prolonged car rides. Continue coumadin with goal INR 2-3, through mid [place] for full 3 months, then reassess need and desire to continue. F/u then.

## 2015-06-29 NOTE — Assessment & Plan Note (Addendum)
Prolonged sxs, treat with 2 wk augmentin course. If persistent sxs, low threshold to refer to ENT for further evaluation. Pt agrees with plan. Will send in 500mg  augmentin/clavulanate

## 2015-06-29 NOTE — Progress Notes (Signed)
Pre visit review using our clinic review tool, if applicable. No additional management support is needed unless otherwise documented below in the visit note. 

## 2015-06-29 NOTE — Assessment & Plan Note (Addendum)
?  anemia of chronic kidney disease. Further eval with anemia panel (ferritin, iron panel, folate and b12).

## 2015-06-29 NOTE — Patient Instructions (Addendum)
For possible right sinus infection - treat with 2 weeks of augmentin. If persistent right sided pain/congestion, let me know for referral to ENT. Continue coumadin for full 3 months. Reassess in mid February.  Return after mid February for follow up visit and decision on coumadin. Labwork today.

## 2015-07-01 LAB — PROTEIN ELECTROPHORESIS, SERUM, WITH REFLEX
Albumin ELP: 4.2 g/dL (ref 3.8–4.8)
Alpha-1-Globulin: 0.3 g/dL (ref 0.2–0.3)
Alpha-2-Globulin: 0.6 g/dL (ref 0.5–0.9)
Beta 2: 0.4 g/dL (ref 0.2–0.5)
Beta Globulin: 0.6 g/dL (ref 0.4–0.6)
Gamma Globulin: 0.8 g/dL (ref 0.8–1.7)
Total Protein, Serum Electrophoresis: 6.8 g/dL (ref 6.1–8.1)

## 2015-07-04 ENCOUNTER — Encounter: Payer: Self-pay | Admitting: *Deleted

## 2015-07-04 ENCOUNTER — Other Ambulatory Visit: Payer: Self-pay | Admitting: Family Medicine

## 2015-07-12 ENCOUNTER — Other Ambulatory Visit: Payer: Medicare HMO

## 2015-07-13 ENCOUNTER — Ambulatory Visit (INDEPENDENT_AMBULATORY_CARE_PROVIDER_SITE_OTHER): Payer: Medicare HMO | Admitting: Internal Medicine

## 2015-07-13 ENCOUNTER — Encounter: Payer: Self-pay | Admitting: Internal Medicine

## 2015-07-13 VITALS — BP 128/76 | HR 64 | Temp 98.4°F | Wt 173.5 lb

## 2015-07-13 DIAGNOSIS — M25551 Pain in right hip: Secondary | ICD-10-CM

## 2015-07-13 MED ORDER — HYDROCODONE-ACETAMINOPHEN 5-325 MG PO TABS: 1.0000 | ORAL_TABLET | Freq: Four times a day (QID) | ORAL | Status: DC | PRN

## 2015-07-13 MED ORDER — METHOCARBAMOL 500 MG PO TABS: 500.0000 mg | ORAL_TABLET | Freq: Every evening | ORAL | Status: DC | PRN

## 2015-07-13 NOTE — Progress Notes (Signed)
Subjective:    Patient ID: Ann White, female    DOB: March 24, 1946, 69 y.o.   MRN: [id number]  HPI  Pt presents to the clinic today with c/o right hip pain. This started 1 week ago. She was leaned down getting something from under her sink, when all of a sudden she heard a "pop". The pain started in her right hip and radiated into her right groin. She describes the pain as sharp and cramping. The pain is worse with sitting and laying down. She denies numbness or tingling in her leg. She denies back pain, loss of bowel or bladder. She has tried Tylenol, Tramadol and heat with minimal relief.  Review of Systems      Past Medical History  Diagnosis Date  . Irritable bowel syndrome with constipation   . Arthritis   . Depression   . GERD (gastroesophageal reflux disease)   . Seasonal allergic rhinitis   . HTN (hypertension)   . History of chicken pox   . History of diverticulitis of colon   . HLD (hyperlipidemia)   . Lichen sclerosus et atrophicus   . Obesity   . Cervical spondylosis     s/p spine injections  . Ectatic abdominal aorta ([place]) 2016    rpt [place] 5 yrs    Current Outpatient Prescriptions  Medication Sig Dispense Refill  . Acetaminophen (ARTHRITIS PAIN RELIEF PO) Take by mouth.    Marland Kitchen amoxicillin-clavulanate (AUGMENTIN) 500-125 MG tablet Take 1 tablet (500 mg total) by mouth 2 (two) times daily. 28 tablet 0  . Biotin 5000 MCG CAPS Take by mouth.    . Cholecalciferol (VITAMIN D3) 1000 UNITS CAPS Take 1 capsule (1,000 Units total) by mouth daily.    . clonazePAM (KLONOPIN) 1 MG tablet Take 1 tablet (1 mg total) by mouth 2 (two) times daily as needed. 60 tablet 3  . Docusate Calcium (STOOL SOFTENER PO) Take by mouth daily.     . fenofibrate (TRICOR) 145 MG tablet Take 1 tablet (145 mg total) by mouth daily. 30 tablet 6  . fluticasone (FLONASE) 50 MCG/ACT nasal spray Place 2 sprays into the nose daily. 16 g 3  . gabapentin (NEURONTIN) 300 MG capsule Take 1 capsule (300 mg  total) by mouth at bedtime. 90 capsule 3  . MAGNESIUM CITRATE PO Take by mouth.    . Misc Natural Products (SINUS FORMULA PO) Take by mouth.    . Multiple Vitamin (MULTIVITAMIN) tablet Take 1 tablet by mouth daily.    . Omega-3 Fatty Acids (FISH OIL) 1000 MG CAPS Take one by mouth two times a day    . pantoprazole (PROTONIX) 40 MG tablet Take 1 tablet (40 mg total) by mouth daily.    . Red Yeast Rice 600 MG CAPS Take 1 capsule by mouth 2 (two) times daily.    . sertraline (ZOLOFT) 100 MG tablet Take 1 tablet (100 mg total) by mouth daily. 90 tablet 3  . traMADol (ULTRAM) 50 MG tablet Take 1 tablet (50 mg total) by mouth 3 (three) times daily as needed for moderate pain. 120 tablet 0  . warfarin (COUMADIN) 5 MG tablet Take as directed by anti-coagulation clinic. 90 tablet 0   No current facility-administered medications for this visit.    Allergies  Allergen Reactions  . Sulfa Antibiotics Swelling    Causes mouth to swell  . Contrast Media [Iodinated Diagnostic Agents] Other (See Comments)    Oral contrast caused mouth blisters  . Lipitor [Atorvastatin] Other (  See Comments)    myalgias  . Pravastatin Other (See Comments)    myalgias    Family History  Problem Relation Age of Onset  . CAD Mother 35    MI  . Hypertension Mother   . Parkinson's disease Father   . Diabetes Father   . CAD Father 61    MI  . Cancer Neg Hx   . Stroke Neg Hx   . Colon cancer Neg Hx     Social History   Social History  . Marital Status: Married    Spouse Name: N/A  . Number of Children: N/A  . Years of Education: N/A   Occupational History  . Not on file.   Social History Main Topics  . Smoking status: Never Smoker   . Smokeless tobacco: Never Used     Comment: minimal smoking history  . Alcohol Use: No  . Drug Use: No  . Sexual Activity: Not on file   Other Topics Concern  . Not on file   Social History Narrative   Lives with husband and 2 cats.  2 grown children, 5  grandchildren.   Occupation: retired, worked in [employer]   Activity: no regular exercise.  Does walk in summer   Diet: fruits/vegetables daily, good water.     Constitutional: Denies fever, malaise, fatigue, headache or abrupt weight changes.  Respiratory: Denies difficulty breathing, shortness of breath, cough or sputum production.   Cardiovascular: Denies chest pain, chest tightness, palpitations or swelling in the hands or feet.  Gastrointestinal: Denies abdominal pain, bloating, constipation, diarrhea or blood in the stool.  GU: Denies urgency, frequency, pain with urination, burning sensation, blood in urine, odor or discharge. Musculoskeletal: Pt reports right hip pain. Denies decrease in range of motion, difficulty with gait, muscle pain or joint swelling.  Neurological: Denies dizziness, difficulty with memory, difficulty with speech or problems with balance and coordination.    No other specific complaints in a complete review of systems (except as listed in HPI above).  Objective:   Physical Exam  BP 128/76 mmHg  Pulse 64  Temp(Src) 98.4 F (36.9 C) (Oral)  Wt 173 lb 8 oz (78.699 kg)  SpO2 98% Wt Readings from Last 3 Encounters:  07/13/15 173 lb 8 oz (78.699 kg)  06/29/15 175 lb 12 oz (79.72 kg)  05/26/15 181 lb 8 oz (82.328 kg)    General: Appears her stated age, well developed, well nourished in NAD. Abdomen: Soft and nontender. No CVA tenderness noted. Musculoskeletal: Normal flexion of the hip. Pain with extension. Normal external rotation. Pain with internal rotation. No bony tenderness noted over the lumbar spine. Strength 5/5 BLE. No difficulty with gait.  Neurological: Alert and oriented. Negative SLR. EKG:  BMET    Component Value Date/Time   NA 141 06/29/2015 1014   K 4.1 06/29/2015 1014   CL 104 06/29/2015 1014   CO2 27 06/29/2015 1014   GLUCOSE 83 06/29/2015 1014   BUN 25* 06/29/2015 1014   CREATININE 1.37* 06/29/2015 1014   CREATININE 1.19 07/26/2011     CALCIUM 9.6 06/29/2015 1014   GFRNONAA >60 08/23/2008 1350   GFRAA  08/23/2008 1350    >60        The eGFR has been calculated using the MDRD equation. This calculation has not been validated in all clinical situations. eGFR's persistently <60 mL/min signify possible Chronic Kidney Disease.    Lipid Panel     Component Value Date/Time   CHOL 250* 03/16/2015 1139  TRIG 233.0* 03/16/2015 1139   HDL 46.20 03/16/2015 1139   CHOLHDL 5 03/16/2015 1139   VLDL 46.6* 03/16/2015 1139    CBC    Component Value Date/Time   WBC 4.2 06/29/2015 1014   RBC 3.76* 06/29/2015 1014   HGB 11.0* 06/29/2015 1014   HCT 33.3* 06/29/2015 1014   PLT 253.0 06/29/2015 1014   MCV 88.6 06/29/2015 1014   MCHC 33.1 06/29/2015 1014   RDW 14.1 06/29/2015 1014   LYMPHSABS 1.1 06/29/2015 1014   MONOABS 0.2 06/29/2015 1014   EOSABS 0.2 06/29/2015 1014   BASOSABS 0.0 06/29/2015 1014    Hgb A1C No results found for: HGBA1C        Assessment & Plan:  Right hip pain:  ? Strain RX for Norco Q6H for severe pain eRx for Robaxin 500 mg QHS prn to help relax the muscles PT exercises given If pain persist,consider xray of right hip  RTC as needed or if symptoms persist or worsen

## 2015-07-13 NOTE — Progress Notes (Signed)
Pre visit review using our clinic review tool, if applicable. No additional management support is needed unless otherwise documented below in the visit note. 

## 2015-07-13 NOTE — Patient Instructions (Signed)

## 2015-07-14 ENCOUNTER — Ambulatory Visit (INDEPENDENT_AMBULATORY_CARE_PROVIDER_SITE_OTHER): Payer: Medicare HMO | Admitting: *Deleted

## 2015-07-14 DIAGNOSIS — I824Z1 Acute embolism and thrombosis of unspecified deep veins of right distal lower extremity: Secondary | ICD-10-CM | POA: Diagnosis not present

## 2015-07-14 DIAGNOSIS — Z5181 Encounter for therapeutic drug level monitoring: Secondary | ICD-10-CM | POA: Diagnosis not present

## 2015-07-14 DIAGNOSIS — I82441 Acute embolism and thrombosis of right tibial vein: Secondary | ICD-10-CM

## 2015-07-14 LAB — POCT INR: INR: 2.8

## 2015-07-14 NOTE — Progress Notes (Signed)
Pre visit review using our clinic review tool, if applicable. No additional management support is needed unless otherwise documented below in the visit note. 

## 2015-07-15 ENCOUNTER — Encounter: Payer: Self-pay | Admitting: Internal Medicine

## 2015-07-15 ENCOUNTER — Ambulatory Visit (INDEPENDENT_AMBULATORY_CARE_PROVIDER_SITE_OTHER): Payer: Medicare HMO | Admitting: Internal Medicine

## 2015-07-15 VITALS — BP 148/80 | HR 58 | Temp 97.5°F | Wt 173.0 lb

## 2015-07-15 DIAGNOSIS — M5431 Sciatica, right side: Secondary | ICD-10-CM | POA: Diagnosis not present

## 2015-07-15 MED ORDER — GABAPENTIN 300 MG PO CAPS: 600.0000 mg | ORAL_CAPSULE | Freq: Every day | ORAL | Status: DC

## 2015-07-15 MED ORDER — PREDNISONE 20 MG PO TABS: 40.0000 mg | ORAL_TABLET | Freq: Every day | ORAL | Status: DC

## 2015-07-15 NOTE — Progress Notes (Signed)
Subjective:    Patient ID: Ann White, female    DOB: 29-Aug-1945, 69 y.o.   MRN: [id number]  HPI Here with husband  Ongoing pain after last visit Pills not helping Got on the floor and felt "something burst or tightened" Pain immediate in right buttock and groin/upper thigh--burning there Now entire leg is sore--more crampy down the leg  Had improved some but then swept the floor yesterday and got worse again No sleep despite the muscle relaxer Narcotic not helping  Leg not weak--just painful Chronic problems with that leg---coumadin for DVT not long ago  Current Outpatient Prescriptions on File Prior to Visit  Medication Sig Dispense Refill  . Acetaminophen (ARTHRITIS PAIN RELIEF PO) Take by mouth.    Marland Kitchen amoxicillin-clavulanate (AUGMENTIN) 500-125 MG tablet Take 1 tablet (500 mg total) by mouth 2 (two) times daily. 28 tablet 0  . Biotin 5000 MCG CAPS Take by mouth.    . Cholecalciferol (VITAMIN D3) 1000 UNITS CAPS Take 1 capsule (1,000 Units total) by mouth daily.    . clonazePAM (KLONOPIN) 1 MG tablet Take 1 tablet (1 mg total) by mouth 2 (two) times daily as needed. 60 tablet 3  . Docusate Calcium (STOOL SOFTENER PO) Take by mouth daily.     . fenofibrate (TRICOR) 145 MG tablet Take 1 tablet (145 mg total) by mouth daily. 30 tablet 6  . gabapentin (NEURONTIN) 300 MG capsule Take 1 capsule (300 mg total) by mouth at bedtime. 90 capsule 3  . HYDROcodone-acetaminophen (NORCO/VICODIN) 5-325 MG tablet Take 1 tablet by mouth every 6 (six) hours as needed for moderate pain. 30 tablet 0  . MAGNESIUM CITRATE PO Take by mouth.    . methocarbamol (ROBAXIN) 500 MG tablet Take 1 tablet (500 mg total) by mouth at bedtime as needed for muscle spasms. 20 tablet 0  . Misc Natural Products (SINUS FORMULA PO) Take by mouth.    . Multiple Vitamin (MULTIVITAMIN) tablet Take 1 tablet by mouth daily.    . Omega-3 Fatty Acids (FISH OIL) 1000 MG CAPS Take one by mouth two times a day    .  pantoprazole (PROTONIX) 40 MG tablet Take 1 tablet (40 mg total) by mouth daily.    . Red Yeast Rice 600 MG CAPS Take 1 capsule by mouth 2 (two) times daily.    . sertraline (ZOLOFT) 100 MG tablet Take 1 tablet (100 mg total) by mouth daily. 90 tablet 3  . traMADol (ULTRAM) 50 MG tablet Take 1 tablet (50 mg total) by mouth 3 (three) times daily as needed for moderate pain. 120 tablet 0  . warfarin (COUMADIN) 5 MG tablet Take as directed by anti-coagulation clinic. 90 tablet 0   No current facility-administered medications on file prior to visit.    Allergies  Allergen Reactions  . Sulfa Antibiotics Swelling    Causes mouth to swell  . Contrast Media [Iodinated Diagnostic Agents] Other (See Comments)    Oral contrast caused mouth blisters  . Lipitor [Atorvastatin] Other (See Comments)    myalgias  . Pravastatin Other (See Comments)    myalgias    Past Medical History  Diagnosis Date  . Irritable bowel syndrome with constipation   . Arthritis   . Depression   . GERD (gastroesophageal reflux disease)   . Seasonal allergic rhinitis   . HTN (hypertension)   . History of chicken pox   . History of diverticulitis of colon   . HLD (hyperlipidemia)   . Lichen sclerosus et  atrophicus   . Obesity   . Cervical spondylosis     s/p spine injections  . Ectatic abdominal aorta ([place]) 2016    rpt [place] 5 yrs    Past Surgical History  Procedure Laterality Date  . Total abdominal hysterectomy w/ bilateral salpingoophorectomy  1990s    complete, dysmenorrhea and fibroids  . Cholecystectomy  1990s  . Carpal tunnel release  2004, 2013    bilateral  . Bladder tack  1990s  . Breast biopsy  2013    at breast center, benign  . Dexa  07/2011    normal, T score -0.3  . Colonoscopy  2002  . Esophagogastroduodenoscopy endoscopy  2004    normal [place] [facility])  . Colonoscopy  06/2013    mod diverticulosis, 3 tubular adenomas, int hem rpt 5 yrs [place] [facility])    Family History  Problem Relation Age of Onset    . CAD Mother 69    MI  . Hypertension Mother   . Parkinson's disease Father   . Diabetes Father   . CAD Father 25    MI  . Cancer Neg Hx   . Stroke Neg Hx   . Colon cancer Neg Hx     Social History   Social History  . Marital Status: Married    Spouse Name: N/A  . Number of Children: N/A  . Years of Education: N/A   Occupational History  . Not on file.   Social History Main Topics  . Smoking status: Never Smoker   . Smokeless tobacco: Never Used     Comment: minimal smoking history  . Alcohol Use: No  . Drug Use: No  . Sexual Activity: Not on file   Other Topics Concern  . Not on file   Social History Narrative   Lives with husband and 2 cats.  2 grown children, 5 grandchildren.   Occupation: retired, worked in [employer]   Activity: no regular exercise.  Does walk in summer   Diet: fruits/vegetables daily, good water.   Review of Systems No fever--but gets hot feeling at times No real back pain--just the upper buttock    Objective:   Physical Exam  Constitutional: She appears well-developed. No distress.  Musculoskeletal:  No spine tenderness Fairly normal right hip ROM SLR pretty good on right and very normal on left  Neurological:  Mild weakness in right hip flexors and extensors (seems to be from pain) Normal gait with small steps          Assessment & Plan:

## 2015-07-15 NOTE — Assessment & Plan Note (Signed)
Doesn't seem to have clear findings of ruptured disc but this is possible Will try a prednisone course Increase nighttime gabapentin and add AM dose  Further eval if not improving over the next week or so

## 2015-07-15 NOTE — Progress Notes (Signed)
Pre visit review using our clinic review tool, if applicable. No additional management support is needed unless otherwise documented below in the visit note. 

## 2015-07-15 NOTE — Patient Instructions (Addendum)
Try a 300mg  gabapentin now-- make sure you are not going to be driving. Then try 2 capsules at bedtime tonight.

## 2015-07-21 ENCOUNTER — Telehealth: Payer: Self-pay | Admitting: Family Medicine

## 2015-07-21 NOTE — Telephone Encounter (Signed)
Spoke with patient and answered questions.

## 2015-07-21 NOTE — Telephone Encounter (Signed)
Pt has questions about her labs  Please call [phone] Thanks

## 2015-07-25 ENCOUNTER — Telehealth: Payer: Self-pay | Admitting: Family Medicine

## 2015-07-25 NOTE — Telephone Encounter (Signed)
Pt called stating she has an appt for a root canal scheduled for tomorrow 07/26/15 and was told to ask if she can have that done with being on coumadin. You can contact her at [phone].

## 2015-07-25 NOTE — Telephone Encounter (Signed)
Patient notified

## 2015-07-25 NOTE — Telephone Encounter (Signed)
Lab Results  Component Value Date   INR 2.8 07/14/2015   INR 2.1 06/27/2015   INR 1.3 06/21/2015  indication is acute DVT. I would think ok to do - dentists have topical hemostatic agents they can use as well. Will route to [place].

## 2015-08-04 ENCOUNTER — Ambulatory Visit (INDEPENDENT_AMBULATORY_CARE_PROVIDER_SITE_OTHER): Payer: Medicare HMO | Admitting: *Deleted

## 2015-08-04 DIAGNOSIS — Z5181 Encounter for therapeutic drug level monitoring: Secondary | ICD-10-CM

## 2015-08-04 DIAGNOSIS — I824Z1 Acute embolism and thrombosis of unspecified deep veins of right distal lower extremity: Secondary | ICD-10-CM

## 2015-08-04 DIAGNOSIS — I82441 Acute embolism and thrombosis of right tibial vein: Secondary | ICD-10-CM

## 2015-08-04 LAB — POCT INR: INR: 3.1

## 2015-08-04 NOTE — Progress Notes (Signed)
Pre visit review using our clinic review tool, if applicable. No additional management support is needed unless otherwise documented below in the visit note. 

## 2015-08-05 DIAGNOSIS — M4722 Other spondylosis with radiculopathy, cervical region: Secondary | ICD-10-CM | POA: Diagnosis not present

## 2015-08-05 DIAGNOSIS — M4806 Spinal stenosis, lumbar region: Secondary | ICD-10-CM | POA: Diagnosis not present

## 2015-08-05 DIAGNOSIS — M546 Pain in thoracic spine: Secondary | ICD-10-CM | POA: Diagnosis not present

## 2015-08-05 DIAGNOSIS — M4726 Other spondylosis with radiculopathy, lumbar region: Secondary | ICD-10-CM | POA: Diagnosis not present

## 2015-08-05 DIAGNOSIS — M5030 Other cervical disc degeneration, unspecified cervical region: Secondary | ICD-10-CM | POA: Diagnosis not present

## 2015-08-05 DIAGNOSIS — Z6830 Body mass index (BMI) 30.0-30.9, adult: Secondary | ICD-10-CM | POA: Diagnosis not present

## 2015-08-05 DIAGNOSIS — M5136 Other intervertebral disc degeneration, lumbar region: Secondary | ICD-10-CM | POA: Diagnosis not present

## 2015-08-05 DIAGNOSIS — M4316 Spondylolisthesis, lumbar region: Secondary | ICD-10-CM | POA: Diagnosis not present

## 2015-08-17 DEATH — deceased

## 2015-08-22 ENCOUNTER — Other Ambulatory Visit: Payer: Self-pay | Admitting: *Deleted

## 2015-08-22 MED ORDER — PANTOPRAZOLE SODIUM 40 MG PO TBEC: 40.0000 mg | DELAYED_RELEASE_TABLET | Freq: Every day | ORAL | Status: DC

## 2015-08-22 MED ORDER — CLONAZEPAM 1 MG PO TABS: 1.0000 mg | ORAL_TABLET | Freq: Two times a day (BID) | ORAL | Status: DC | PRN

## 2015-08-22 MED ORDER — SERTRALINE HCL 100 MG PO TABS: 100.0000 mg | ORAL_TABLET | Freq: Every day | ORAL | Status: DC

## 2015-08-22 NOTE — Telephone Encounter (Signed)
Patient left a voicemail stating that she is changing her pharmacy to [facility]/[place]. Patient stated that she needs refills on Clonazepam,  Pantoprazole and Sertraline.  Last office visit 07/15/15/acute Last refill Clonazepam 06/03/15 #60/3 See drug warning between Sertraline and Tramadol. Okay to refill?  Refill sent to the pharmacy for Pantoprazole.

## 2015-08-22 NOTE — Telephone Encounter (Signed)
plz phone in klonopin. 

## 2015-08-23 NOTE — Telephone Encounter (Signed)
Rx called in as directed.   

## 2015-08-25 ENCOUNTER — Ambulatory Visit (INDEPENDENT_AMBULATORY_CARE_PROVIDER_SITE_OTHER): Payer: Medicare HMO | Admitting: *Deleted

## 2015-08-25 DIAGNOSIS — Z5181 Encounter for therapeutic drug level monitoring: Secondary | ICD-10-CM | POA: Diagnosis not present

## 2015-08-25 DIAGNOSIS — I82441 Acute embolism and thrombosis of right tibial vein: Secondary | ICD-10-CM

## 2015-08-25 DIAGNOSIS — I824Z1 Acute embolism and thrombosis of unspecified deep veins of right distal lower extremity: Secondary | ICD-10-CM

## 2015-08-25 LAB — POCT INR: INR: 2.2

## 2015-08-25 NOTE — Progress Notes (Signed)
Pre visit review using our clinic review tool, if applicable. No additional management support is needed unless otherwise documented below in the visit note. 

## 2015-08-25 NOTE — Progress Notes (Signed)
INR is now therapeutic.  Patient will continue with same dose.  Recheck pushed out to 4 weeks.

## 2015-08-31 ENCOUNTER — Ambulatory Visit (INDEPENDENT_AMBULATORY_CARE_PROVIDER_SITE_OTHER): Payer: Medicare HMO | Admitting: Family Medicine

## 2015-08-31 ENCOUNTER — Encounter: Payer: Self-pay | Admitting: Family Medicine

## 2015-08-31 VITALS — BP 138/82 | HR 72 | Temp 98.1°F | Wt 170.0 lb

## 2015-08-31 DIAGNOSIS — N183 Chronic kidney disease, stage 3 unspecified: Secondary | ICD-10-CM

## 2015-08-31 DIAGNOSIS — K219 Gastro-esophageal reflux disease without esophagitis: Secondary | ICD-10-CM | POA: Diagnosis not present

## 2015-08-31 DIAGNOSIS — M25561 Pain in right knee: Secondary | ICD-10-CM | POA: Insufficient documentation

## 2015-08-31 DIAGNOSIS — E785 Hyperlipidemia, unspecified: Secondary | ICD-10-CM

## 2015-08-31 DIAGNOSIS — I824Z1 Acute embolism and thrombosis of unspecified deep veins of right distal lower extremity: Secondary | ICD-10-CM

## 2015-08-31 NOTE — Progress Notes (Signed)
BP 138/82 mmHg  Pulse 72  Temp(Src) 98.1 F (36.7 C) (Oral)  Wt 170 lb (77.111 kg)   CC: f/u visit  Subjective:    Patient ID: Ann White, female    DOB: 08/20/1945, 70 y.o.   MRN: [id number]  HPI: Ann White is a 70 y.o. female presenting on 08/31/2015 for Follow-up   05/2015 first provoked symptomatic distal acute DVT, coumadin started at that time. rec for at least 3 months. Did receive lovenox bridging. Pt stopped estrogen therapy (was taking for hot flashes). Has established with our coumadin clinic. Leg feeling better, knee has started aching.   R knee pain - worsening recently. Instability present, no locking. Denies inciting trauma or injury. Uses intermittent tylenol. Worse with standing up.  S/p TAH with BSO 1990s for fibroids with dysmenorrhea.   Acute renal insufficiency over last few months - abdominal ultrasound showed normal kidneys, no other abnormality. SPEP normal. She is off lisinopril for the last month. BP remains stable. She stays hydrated.   HLD - on RYR BID. Also on tricor since 03/2015. Stopped 2wks ago due to myalgias.   Relevant past medical, surgical, family and social history reviewed and updated as indicated. Interim medical history since our last visit reviewed. Allergies and medications reviewed and updated. Current Outpatient Prescriptions on File Prior to Visit  Medication Sig  . Acetaminophen (ARTHRITIS PAIN RELIEF PO) Take by mouth.  . Biotin 5000 MCG CAPS Take by mouth.  . Cholecalciferol (VITAMIN D3) 1000 UNITS CAPS Take 1 capsule (1,000 Units total) by mouth daily.  . clonazePAM (KLONOPIN) 1 MG tablet Take 1 tablet (1 mg total) by mouth 2 (two) times daily as needed.  Mariane Baumgarten Calcium (STOOL SOFTENER PO) Take by mouth daily.   Marland Kitchen gabapentin (NEURONTIN) 300 MG capsule Take 2 capsules (600 mg total) by mouth at bedtime. And 1 capsule in the morning  . HYDROcodone-acetaminophen (NORCO/VICODIN) 5-325 MG tablet Take 1 tablet by mouth every 6  (six) hours as needed for moderate pain.  Marland Kitchen MAGNESIUM CITRATE PO Take by mouth.  . methocarbamol (ROBAXIN) 500 MG tablet Take 1 tablet (500 mg total) by mouth at bedtime as needed for muscle spasms.  . Misc Natural Products (SINUS FORMULA PO) Take by mouth.  . Multiple Vitamin (MULTIVITAMIN) tablet Take 1 tablet by mouth daily.  . Omega-3 Fatty Acids (FISH OIL) 1000 MG CAPS Take one by mouth two times a day  . pantoprazole (PROTONIX) 40 MG tablet Take 1 tablet (40 mg total) by mouth daily. (Patient taking differently: Take 20 mg by mouth daily. )  . Red Yeast Rice 600 MG CAPS Take 1 capsule by mouth 2 (two) times daily.  . sertraline (ZOLOFT) 100 MG tablet Take 1 tablet (100 mg total) by mouth daily.  . traMADol (ULTRAM) 50 MG tablet Take 1 tablet (50 mg total) by mouth 3 (three) times daily as needed for moderate pain.  Marland Kitchen warfarin (COUMADIN) 5 MG tablet Take as directed by anti-coagulation clinic.   No current facility-administered medications on file prior to visit.    Review of Systems Per HPI unless specifically indicated in ROS section     Objective:    BP 138/82 mmHg  Pulse 72  Temp(Src) 98.1 F (36.7 C) (Oral)  Wt 170 lb (77.111 kg)  Wt Readings from Last 3 Encounters:  08/31/15 170 lb (77.111 kg)  07/15/15 173 lb (78.472 kg)  07/13/15 173 lb 8 oz (78.699 kg)    Physical Exam  Constitutional: She appears well-developed and well-nourished. No distress.  HENT:  Mouth/Throat: Oropharynx is clear and moist. No oropharyngeal exudate.  Cardiovascular: Normal rate, regular rhythm, normal heart sounds and intact distal pulses.   No murmur heard. Pulmonary/Chest: Effort normal and breath sounds normal. No respiratory distress. She has no wheezes. She has no rales.  Musculoskeletal: She exhibits no edema.  L knee WNL R Knee exam: No deformity on inspection. Tender lateral knee  Swelling at knee. FROM in flex/extension without crepitus. Pain with full flexion. No popliteal  fullness. Neg drawer test. + mcmurray test. No pain with valgus/varus stress. No PFgrind. No abnormal patellar mobility.   Skin: Skin is warm and dry. No rash noted.  Psychiatric: She has a normal mood and affect.  Nursing note and vitals reviewed.  Results for orders placed or performed in visit on 08/25/15  POCT INR  Result Value Ref Range   INR 2.2    Lab Results  Component Value Date   CREATININE 1.37* 06/29/2015       Assessment & Plan:   Problem List Items Addressed This Visit    Right knee pain    Anticipate osteoarthritis plus possible degenerative meniscal tear. Treat supportively - schedule tylenol, elevation/ice, knee brace for support. If no better, return for steroid injection and consider ortho referral. Pt agrees with plan.      HLD (hyperlipidemia)    Continue RYR and fish oil. Did not tolerate statins or fibrates. Check FLP next labwork.      GERD (gastroesophageal reflux disease)    No sxs on PPI daily. With recent renal insufficiency, trial lower protonix dose to 20mg  daily (1/2 tablet of 40mg ). Update with effect, see if we can taper off. Has had normal EGD.      CKD (chronic kidney disease) stage 3, GFR 30-59 ml/min    Reviewed with patient. Has had stable abd [place], SPEP. We have decreased protonix to 40mg  daily - continue to decrease.  recheck next labwork.      Acute venous embolism and thrombosis of deep vessels of distal lower extremity (HCC) - Primary    Provoked symptomatic distal DVT - completed 3 mo coumadin therapy. rec continued x 1 more month then may stop coumadin.           Follow up plan: Return in about 3 months (around 11/28/2015), or as needed, for follow up visit.

## 2015-08-31 NOTE — Assessment & Plan Note (Signed)
Reviewed with patient. Has had stable abd [place], SPEP. We have decreased protonix to 40mg  daily - continue to decrease.  recheck next labwork.

## 2015-08-31 NOTE — Assessment & Plan Note (Signed)
Anticipate osteoarthritis plus possible degenerative meniscal tear. Treat supportively - schedule tylenol, elevation/ice, knee brace for support. If no better, return for steroid injection and consider ortho referral. Pt agrees with plan.

## 2015-08-31 NOTE — Assessment & Plan Note (Addendum)
Continue RYR and fish oil. Did not tolerate statins or fibrates. Check FLP next labwork.

## 2015-08-31 NOTE — Patient Instructions (Addendum)
Continue coumadin for 1 month then stop.  Try 1/2 tablet protonix daily - watch for heart burn symptoms developing.  If tolerated well, try off medicine.  For knee - I think you have some arthritis and possible meniscal injury of knee. Treat with scheduled tylenol 500mg  twice daily, use brace at home, and elevate knee, ice knee. If no better, return for steroid shot.

## 2015-08-31 NOTE — Assessment & Plan Note (Signed)
Provoked symptomatic distal DVT - completed 3 mo coumadin therapy. rec continued x 1 more month then may stop coumadin.

## 2015-08-31 NOTE — Progress Notes (Signed)
Pre visit review using our clinic review tool, if applicable. No additional management support is needed unless otherwise documented below in the visit note. 

## 2015-08-31 NOTE — Assessment & Plan Note (Signed)
No sxs on PPI daily. With recent renal insufficiency, trial lower protonix dose to 20mg  daily (1/2 tablet of 40mg ). Update with effect, see if we can taper off. Has had normal EGD.

## 2015-09-05 ENCOUNTER — Other Ambulatory Visit: Payer: Self-pay

## 2015-09-05 DIAGNOSIS — Z1231 Encounter for screening mammogram for malignant neoplasm of breast: Secondary | ICD-10-CM

## 2015-09-21 ENCOUNTER — Other Ambulatory Visit: Payer: Self-pay | Admitting: Family Medicine

## 2015-09-22 ENCOUNTER — Ambulatory Visit (INDEPENDENT_AMBULATORY_CARE_PROVIDER_SITE_OTHER): Payer: Medicare HMO | Admitting: *Deleted

## 2015-09-22 ENCOUNTER — Ambulatory Visit: Payer: Medicare HMO

## 2015-09-22 ENCOUNTER — Telehealth: Payer: Self-pay | Admitting: *Deleted

## 2015-09-22 DIAGNOSIS — I82441 Acute embolism and thrombosis of right tibial vein: Secondary | ICD-10-CM

## 2015-09-22 DIAGNOSIS — I824Z1 Acute embolism and thrombosis of unspecified deep veins of right distal lower extremity: Secondary | ICD-10-CM | POA: Diagnosis not present

## 2015-09-22 DIAGNOSIS — Z5181 Encounter for therapeutic drug level monitoring: Secondary | ICD-10-CM

## 2015-09-22 LAB — POCT INR: INR: 1.5

## 2015-09-22 NOTE — Telephone Encounter (Signed)
Patient is here today for INR check.  She is sub therapeutic at 1.5.  Per 2/15 ov, patient was to discontinue Coumadin.  She has been hesitant to stop Coumadin as she is having some right knee pain and swelling.  No swelling or redness of the calf.  Okay to discontinue Coumadin at this time?  Please advise.

## 2015-09-22 NOTE — Progress Notes (Signed)
Pre visit review using our clinic review tool, if applicable. No additional management support is needed unless otherwise documented below in the visit note. 

## 2015-09-23 NOTE — Telephone Encounter (Signed)
Patient advised.  She will discontinue Coumadin.  She prefers to start with injection for her knee as discussed at the 2/15 ov.  Appointment scheduled with Dr. Lorelei Pont 09/28/15.  Routed to Dr. Lorelei Pont as an Juluis Rainier.

## 2015-09-23 NOTE — Telephone Encounter (Signed)
I think knee pain is separate from DVT. Ok to stop coumadin. Would offer ortho referral.

## 2015-09-28 ENCOUNTER — Ambulatory Visit (INDEPENDENT_AMBULATORY_CARE_PROVIDER_SITE_OTHER): Payer: Medicare HMO | Admitting: Family Medicine

## 2015-09-28 ENCOUNTER — Ambulatory Visit (INDEPENDENT_AMBULATORY_CARE_PROVIDER_SITE_OTHER)
Admission: RE | Admit: 2015-09-28 | Discharge: 2015-09-28 | Disposition: A | Discharge status: Home / Self Care | Payer: Medicare HMO | Source: Ambulatory Visit | Attending: Family Medicine | Admitting: Family Medicine

## 2015-09-28 ENCOUNTER — Encounter: Payer: Self-pay | Admitting: Family Medicine

## 2015-09-28 VITALS — BP 140/80 | HR 68 | Temp 99.9°F | Ht 61.75 in | Wt 172.0 lb

## 2015-09-28 DIAGNOSIS — J018 Other acute sinusitis: Secondary | ICD-10-CM | POA: Diagnosis not present

## 2015-09-28 DIAGNOSIS — M179 Osteoarthritis of knee, unspecified: Secondary | ICD-10-CM | POA: Diagnosis not present

## 2015-09-28 DIAGNOSIS — M25561 Pain in right knee: Secondary | ICD-10-CM

## 2015-09-28 DIAGNOSIS — M1711 Unilateral primary osteoarthritis, right knee: Secondary | ICD-10-CM

## 2015-09-28 MED ORDER — FLUCONAZOLE 150 MG PO TABS
150.0000 mg | ORAL_TABLET | Freq: Once | ORAL | Status: DC
Start: 2015-09-28 — End: 2016-01-10

## 2015-09-28 MED ORDER — AMOXICILLIN 500 MG PO CAPS: 1000.0000 mg | ORAL_CAPSULE | Freq: Two times a day (BID) | ORAL | Status: DC

## 2015-09-28 MED ORDER — METHYLPREDNISOLONE ACETATE 40 MG/ML IJ SUSP
80.0000 mg | Freq: Once | INTRAMUSCULAR | Status: AC
  Administered 2015-09-28: 80 mg via INTRA_ARTICULAR

## 2015-09-28 NOTE — Progress Notes (Signed)
Dr. Frederico Hamman T. Castor Gittleman, MD, [facility] [place], [address] Phone: [phone] Fax: [phone]  09/28/2015  Patient: Ann White, MRN: [id number], DOB: 07-02-1946, 70 y.o.  Primary Physician:  Ria Bush, MD   Chief Complaint  Patient presents with  . Knee Pain    Right  . Sinusitis   Subjective:   Ann White is a 70 y.o. very pleasant female patient who presents with the following:  R knee: No known injury. Bothering for 6 months or more.   Patient presents with 6 mo h/o R sided knee pain after no specific injury. No audible pop was heard. The patient has not had an effusion. No symptomatic giving-way. No mechanical clicking. Joint has not locked up. Patient has been able to walk but is limping. The patient does have pain going up and down stairs or rising from a seated position.   Pain location: R Current physical activity: active at home and in community Prior Knee Surgery: none Current pain meds: OTC, tylenol and tramadol Bracing: none  Sinus congestion and sinusitis. Ongoing for 3-4 days. Worsening with pain in teeth and sinuses.  R knee inj  Past Medical History, Surgical History, Social History, Family History, Problem List, Medications, and Allergies have been reviewed and updated if relevant.  Patient Active Problem List   Diagnosis Date Noted  . Right knee pain 08/31/2015  . Right sided sciatica 07/15/2015  . Anemia, unspecified 06/29/2015  . Chronic right maxillary sinusitis 06/29/2015  . Ectatic abdominal aorta ([place])   . Encounter for therapeutic drug monitoring 05/30/2015  . Acute venous embolism and thrombosis of deep vessels of distal lower extremity ([place]) 05/30/2015  . Lower abdominal pain 05/16/2015  . Advanced care planning/counseling discussion 03/16/2015  . CKD (chronic kidney disease) stage 3, GFR 30-59 ml/min 08/19/2013  . Hot flash, menopausal 02/24/2013  . Obesity, Class  I, BMI 30-34.9 02/24/2013  . Medicare annual wellness visit, subsequent 08/27/2012  . Hyperglycemia 08/27/2012  . Lichen sclerosus et atrophicus   . Irritable bowel syndrome with constipation   . HTN (hypertension)   . Seasonal allergic rhinitis   . GERD (gastroesophageal reflux disease)   . Depression   . History of diverticulitis of colon   . HLD (hyperlipidemia)   . PULMONARY FIBROSIS, POSTINFLAMMATORY 09/10/2008  . Cervical spondylosis without myelopathy 09/09/2008    Past Medical History  Diagnosis Date  . Irritable bowel syndrome with constipation   . Arthritis   . Depression   . GERD (gastroesophageal reflux disease)   . Seasonal allergic rhinitis   . HTN (hypertension)   . History of chicken pox   . History of diverticulitis of colon   . HLD (hyperlipidemia)   . Lichen sclerosus et atrophicus   . Obesity   . Cervical spondylosis     s/p spine injections  . Ectatic abdominal aorta ([place]) 2016    rpt [place] 5 yrs    Past Surgical History  Procedure Laterality Date  . Total abdominal hysterectomy w/ bilateral salpingoophorectomy  1990s    complete, dysmenorrhea and fibroids  . Cholecystectomy  1990s  . Carpal tunnel release  2004, 2013    bilateral  . Bladder tack  1990s  . Breast biopsy  2013    at breast center, benign  . Dexa  07/2011    normal, T score -0.3  . Colonoscopy  2002  . Esophagogastroduodenoscopy endoscopy  2004    normal (  [facility])  . Colonoscopy  06/2013    mod diverticulosis, 3 tubular adenomas, int hem rpt 5 yrs [facility] Plan)    Social History   Social History  . Marital Status: Married    Spouse Name: N/A  . Number of Children: N/A  . Years of Education: N/A   Occupational History  . Not on file.   Social History Main Topics  . Smoking status: Never Smoker   . Smokeless tobacco: Never Used     Comment: minimal smoking history  . Alcohol Use: No  . Drug Use: No  . Sexual Activity: Not on file   Other Topics Concern  . Not on file    Social History Narrative   Lives with husband and 2 cats.  2 grown children, 5 grandchildren.   Occupation: retired, worked in [employer]   Activity: no regular exercise.  Does walk in summer   Diet: fruits/vegetables daily, good water.    Family History  Problem Relation Age of Onset  . CAD Mother 66    MI  . Hypertension Mother   . Parkinson's disease Father   . Diabetes Father   . CAD Father 24    MI  . Cancer Neg Hx   . Stroke Neg Hx   . Colon cancer Neg Hx     Allergies  Allergen Reactions  . Sulfa Antibiotics Swelling    Causes mouth to swell  . Tricor [Fenofibrate] Other (See Comments)    myalgias  . Contrast Media [Iodinated Diagnostic Agents] Other (See Comments)    Oral contrast caused mouth blisters  . Lipitor [Atorvastatin] Other (See Comments)    myalgias  . Pravastatin Other (See Comments)    myalgias    Medication list reviewed and updated in full in [place].  GEN: No fevers, chills. Nontoxic. Primarily MSK c/o today. MSK: Detailed in the HPI GI: tolerating PO intake without difficulty Neuro: No numbness, parasthesias, or tingling associated. Otherwise the pertinent positives of the ROS are noted above.   Objective:   BP 140/80 mmHg  Pulse 68  Temp(Src) 99.9 F (37.7 C) (Oral)  Ht 5' 1.75" (1.568 m)  Wt 172 lb (78.019 kg)  BMI 31.73 kg/m2   Gen: WDWN, NAD; alert,appropriate and cooperative throughout exam  HEENT: Normocephalic and atraumatic. Throat clear, w/o exudate, no LAD, R TM clear, L TM - good landmarks, No fluid present. rhinnorhea.  Left frontal and maxillary sinuses: Tender Right frontal and maxillary sinuses: Tender  Neck: No ant or post LAD CV: RRR, No M/G/R Pulm: Breathing comfortably in no resp distress. no w/c/r Abd: S,NT,ND,+BS Extr: no c/c/e Psych: full affect, pleasant   Knee:  R Gait: Normal heel toe pattern ROM: 0-115 Effusion: mild Echymosis or edema: none Patellar tendon NT Painful PLICA: neg Patellar  grind: negative Medial and lateral patellar facet loading: negative medial and lateral joint lines: medial > lateral joint line pain Mcmurray's pain Flexion-pinch + Varus and valgus stress: stable Lachman: neg Ant and Post drawer: neg Hip abduction, IR, ER: WNL Hip flexion str: 5/5 Hip abd: 5/5 Quad: 5/5 VMO atrophy:No Hamstring concentric and eccentric: 5/5   Radiology: Dg Knee Ap/lat W/sunrise Right  09/28/2015  CLINICAL DATA:  Right knee pain for the past 6 months. No known trauma. EXAM: RIGHT KNEE 3 VIEWS COMPARISON:  None. FINDINGS: No fracture or dislocation. Marker tricompartmental degenerative change of the knee, worse within the lateral compartment with joint space loss, subchondral sclerosis and osteophytosis. There is minimal spurring of  the tibial spines. No evidence of chondrocalcinosis. There is minimal enthesopathic change involving the superior pole the patella. No joint effusion. Regional soft tissues appear normal. IMPRESSION: 1. No acute findings. 2. Moderate tricompartmental degenerative change of the knee, worse within the lateral compartment. Electronically Signed   By: Sandi Mariscal M.D.   On: 09/28/2015 10:10     Assessment and Plan:   Primary osteoarthritis of right knee  Right knee pain - Plan: DG Knee AP/LAT W/Sunrise Right, methylPREDNISolone acetate (DEPO-MEDROL) injection 80 mg  Other acute sinusitis  Moderate osteoarthritis, tricompartmental. Certainly this could all be osteoarthritic flare, but cannot exclude degenerative meniscal tearing also. Now will continue with conservative management, do a trial of a corticosteroid injection of the right knee.  Patient also has some sinusitis currently.  Knee Injection, R Patient verbally consented to procedure. Risks (including potential rare risk of infection), benefits, and alternatives explained. Sterilely prepped with Chloraprep. Ethyl cholride used for anesthesia. 8 cc Lidocaine 1% mixed with 2 mL  Depo-Medrol 40 mg injected using the anteromedial approach without difficulty. No complications with procedure and tolerated well. Patient had decreased pain post-injection.   Follow-up: if not improved in 4 weeks  New Prescriptions   AMOXICILLIN (AMOXIL) 500 MG CAPSULE    Take 2 capsules (1,000 mg total) by mouth 2 (two) times daily.   FLUCONAZOLE (DIFLUCAN) 150 MG TABLET    Take 1 tablet (150 mg total) by mouth once.   Orders Placed This Encounter  Procedures  . DG Knee AP/LAT W/Sunrise Right    Signed,  Nicoletta Hush T. Laniah Grimm, MD   Patient's Medications  New Prescriptions   AMOXICILLIN (AMOXIL) 500 MG CAPSULE    Take 2 capsules (1,000 mg total) by mouth 2 (two) times daily.   FLUCONAZOLE (DIFLUCAN) 150 MG TABLET    Take 1 tablet (150 mg total) by mouth once.  Previous Medications   ACETAMINOPHEN (ARTHRITIS PAIN RELIEF PO)    Take by mouth.   BIOTIN 5000 MCG CAPS    Take by mouth.   CHOLECALCIFEROL (VITAMIN D3) 1000 UNITS CAPS    Take 1 capsule (1,000 Units total) by mouth daily.   CLONAZEPAM (KLONOPIN) 1 MG TABLET    Take 1 tablet (1 mg total) by mouth 2 (two) times daily as needed.   DOCUSATE CALCIUM (STOOL SOFTENER PO)    Take by mouth daily.    GABAPENTIN (NEURONTIN) 300 MG CAPSULE    Take 2 capsules (600 mg total) by mouth at bedtime. And 1 capsule in the morning   MAGNESIUM CITRATE PO    Take by mouth.   MISC NATURAL PRODUCTS (SINUS FORMULA PO)    Take by mouth.   MULTIPLE VITAMIN (MULTIVITAMIN) TABLET    Take 1 tablet by mouth daily.   OMEGA-3 FATTY ACIDS (FISH OIL) 1000 MG CAPS    Take one by mouth two times a day   PANTOPRAZOLE (PROTONIX) 40 MG TABLET    Take 1 tablet (40 mg total) by mouth daily.   RED YEAST RICE 600 MG CAPS    Take 1 capsule by mouth 2 (two) times daily.   SERTRALINE (ZOLOFT) 100 MG TABLET    Take 1 tablet (100 mg total) by mouth daily.   TRAMADOL (ULTRAM) 50 MG TABLET    Take 1 tablet (50 mg total) by mouth 3 (three) times daily as needed for moderate  pain.  Modified Medications   No medications on file  Discontinued Medications   HYDROCODONE-ACETAMINOPHEN (NORCO/VICODIN) 5-325 MG TABLET  Take 1 tablet by mouth every 6 (six) hours as needed for moderate pain.   METHOCARBAMOL (ROBAXIN) 500 MG TABLET    Take 1 tablet (500 mg total) by mouth at bedtime as needed for muscle spasms.   WARFARIN (COUMADIN) 5 MG TABLET    TAKE AS DIRECTED BY ANTI-COAGULATION CLINIC.

## 2015-09-28 NOTE — Progress Notes (Signed)
Pre visit review using our clinic review tool, if applicable. No additional management support is needed unless otherwise documented below in the visit note. 

## 2015-10-10 ENCOUNTER — Ambulatory Visit
Admission: RE | Admit: 2015-10-10 | Discharge: 2015-10-10 | Disposition: A | Discharge status: Home / Self Care | Payer: Medicare HMO | Source: Ambulatory Visit

## 2015-10-10 DIAGNOSIS — Z1231 Encounter for screening mammogram for malignant neoplasm of breast: Secondary | ICD-10-CM

## 2015-10-11 LAB — HM MAMMOGRAPHY: HM Mammogram: NORMAL

## 2015-10-12 ENCOUNTER — Encounter: Payer: Self-pay | Admitting: *Deleted

## 2015-11-16 ENCOUNTER — Telehealth: Payer: Self-pay | Admitting: Family Medicine

## 2015-11-16 NOTE — Telephone Encounter (Signed)
Opened in error

## 2016-01-02 ENCOUNTER — Other Ambulatory Visit: Payer: Self-pay | Admitting: Internal Medicine

## 2016-01-03 ENCOUNTER — Other Ambulatory Visit: Payer: Self-pay

## 2016-01-03 MED ORDER — TRAMADOL HCL 50 MG PO TABS: 50.0000 mg | ORAL_TABLET | Freq: Three times a day (TID) | ORAL | Status: DC | PRN

## 2016-01-03 NOTE — Telephone Encounter (Signed)
Tramadol called in to [facility] [address] [place]. 

## 2016-01-03 NOTE — Telephone Encounter (Signed)
Pt left v/m requesting refill tramadol to [facility] [address] Last refilled # 120 on 05/20/2015;last f/u 08/31/15.Please advise. Dr Darnell Level out of office without computer access.

## 2016-01-03 NOTE — Telephone Encounter (Signed)
Ok to ref #120, 0 ref

## 2016-01-04 ENCOUNTER — Telehealth: Payer: Self-pay

## 2016-01-04 NOTE — Telephone Encounter (Signed)
Pt called to ck status of tramadol refill; spoke with maureen at [facility] and rx ready for pick up. Pt voiced understanding.

## 2016-01-04 NOTE — Telephone Encounter (Signed)
Patient is on the list for [employer] 2017 and may be a good candidate for an AWV in 2017. Please let me know if/when appt is scheduled.   

## 2016-01-09 NOTE — Telephone Encounter (Signed)
Scheduled lab, AWV, and CPE appts.

## 2016-01-10 ENCOUNTER — Encounter: Payer: Self-pay | Admitting: Primary Care

## 2016-01-10 ENCOUNTER — Ambulatory Visit (INDEPENDENT_AMBULATORY_CARE_PROVIDER_SITE_OTHER): Payer: Medicare HMO | Admitting: Primary Care

## 2016-01-10 VITALS — BP 130/76 | HR 58 | Temp 98.1°F | Wt 168.8 lb

## 2016-01-10 DIAGNOSIS — N390 Urinary tract infection, site not specified: Secondary | ICD-10-CM | POA: Diagnosis not present

## 2016-01-10 DIAGNOSIS — R319 Hematuria, unspecified: Secondary | ICD-10-CM | POA: Diagnosis not present

## 2016-01-10 LAB — POC URINALSYSI DIPSTICK (AUTOMATED)
Bilirubin, UA: NEGATIVE
Glucose, UA: NEGATIVE
Ketones, UA: NEGATIVE
Nitrite, UA: NEGATIVE
Spec Grav, UA: >=1.03
Urobilinogen, UA: NEGATIVE
pH, UA: 6

## 2016-01-10 MED ORDER — CEPHALEXIN 500 MG PO CAPS: 500.0000 mg | ORAL_CAPSULE | Freq: Two times a day (BID) | ORAL | Status: DC

## 2016-01-10 NOTE — Patient Instructions (Signed)
Start Cephalexin antibiotics. Take 1 capsule by mouth twice daily for 7 days.  Try AZO to help with burning and pelvic pressure. This may be purchased over the counter.  You must increase consumption of water and reduce diet soda.  Please notify me if no improvement in 3-4 days.  It was a pleasure meeting you!  Urinary Tract Infection Urinary tract infections (UTIs) can develop anywhere along your urinary tract. Your urinary tract is your body's drainage system for removing wastes and extra water. Your urinary tract includes two kidneys, two ureters, a bladder, and a urethra. Your kidneys are a pair of bean-shaped organs. Each kidney is about the size of your fist. They are located below your ribs, one on each side of your spine. CAUSES Infections are caused by microbes, which are microscopic organisms, including fungi, viruses, and bacteria. These organisms are so small that they can only be seen through a microscope. Bacteria are the microbes that most commonly cause UTIs. SYMPTOMS  Symptoms of UTIs may vary by age and gender of the patient and by the location of the infection. Symptoms in young women typically include a frequent and intense urge to urinate and a painful, burning feeling in the bladder or urethra during urination. Older women and men are more likely to be tired, shaky, and weak and have muscle aches and abdominal pain. A fever may mean the infection is in your kidneys. Other symptoms of a kidney infection include pain in your back or sides below the ribs, nausea, and vomiting. DIAGNOSIS To diagnose a UTI, your caregiver will ask you about your symptoms. Your caregiver will also ask you to provide a urine sample. The urine sample will be tested for bacteria and white blood cells. White blood cells are made by your body to help fight infection. TREATMENT  Typically, UTIs can be treated with medication. Because most UTIs are caused by a bacterial infection, they usually can be  treated with the use of antibiotics. The choice of antibiotic and length of treatment depend on your symptoms and the type of bacteria causing your infection. HOME CARE INSTRUCTIONS  If you were prescribed antibiotics, take them exactly as your caregiver instructs you. Finish the medication even if you feel better after you have only taken some of the medication.  Drink enough water and fluids to keep your urine clear or pale yellow.  Avoid caffeine, tea, and carbonated beverages. They tend to irritate your bladder.  Empty your bladder often. Avoid holding urine for long periods of time.  Empty your bladder before and after sexual intercourse.  After a bowel movement, women should cleanse from front to back. Use each tissue only once. SEEK MEDICAL CARE IF:   You have back pain.  You develop a fever.  Your symptoms do not begin to resolve within 3 days. SEEK IMMEDIATE MEDICAL CARE IF:   You have severe back pain or lower abdominal pain.  You develop chills.  You have nausea or vomiting.  You have continued burning or discomfort with urination. MAKE SURE YOU:   Understand these instructions.  Will watch your condition.  Will get help right away if you are not doing well or get worse.   This information is not intended to replace advice given to you by your health care provider. Make sure you discuss any questions you have with your health care provider.   Document Released: 04/11/2005 Document Revised: 03/23/2015 Document Reviewed: 08/10/2011 Elsevier Interactive Patient Education [employer].

## 2016-01-10 NOTE — Progress Notes (Signed)
Subjective:    Patient ID: Ann White, female    DOB: November 10, 1945, 70 y.o.   MRN: [id number]  HPI  Ann White is a 70 year old female who presents today with a chief complaint of pelvic pressure. Ann White also reports lower back pain and dysuria. Her symptoms have been present for the past 2-3 days. Denies hematuria, fevers, nausea, vaginal discharge, vaginal itching. Ann White's taken Cystex over the counter with temporary improvement. Ann White drinks diet sods mostly during the day,little water intake on a typical basis.   Review of Systems  Constitutional: Positive for fatigue. Negative for fever.  Genitourinary: Positive for dysuria, frequency and pelvic pain. Negative for hematuria and vaginal discharge.  Musculoskeletal: Positive for back pain.       Past Medical History  Diagnosis Date  . Irritable bowel syndrome with constipation   . Arthritis   . Depression   . GERD (gastroesophageal reflux disease)   . Seasonal allergic rhinitis   . HTN (hypertension)   . History of chicken pox   . History of diverticulitis of colon   . HLD (hyperlipidemia)   . Lichen sclerosus et atrophicus   . Obesity   . Cervical spondylosis     s/p spine injections  . Ectatic abdominal aorta ([place]) 2016    rpt [place] 5 yrs     Social History   Social History  . Marital Status: Married    Spouse Name: N/A  . Number of Children: N/A  . Years of Education: N/A   Occupational History  . Not on file.   Social History Main Topics  . Smoking status: Never Smoker   . Smokeless tobacco: Never Used     Comment: minimal smoking history  . Alcohol Use: No  . Drug Use: No  . Sexual Activity: Not on file   Other Topics Concern  . Not on file   Social History Narrative   Lives with husband and 2 cats.  2 grown children, 5 grandchildren.   Occupation: retired, worked in [employer]   Activity: no regular exercise.  Does walk in summer   Diet: fruits/vegetables daily, good water.    Past Surgical History  Procedure  Laterality Date  . Total abdominal hysterectomy w/ bilateral salpingoophorectomy  1990s    complete, dysmenorrhea and fibroids  . Cholecystectomy  1990s  . Carpal tunnel release  2004, 2013    bilateral  . Bladder tack  1990s  . Breast biopsy  2013    at breast center, benign  . Dexa  07/2011    normal, T score -0.3  . Colonoscopy  2002  . Esophagogastroduodenoscopy endoscopy  2004    normal [place] [facility])  . Colonoscopy  06/2013    mod diverticulosis, 3 tubular adenomas, int hem rpt 5 yrs [facility])    Family History  Problem Relation Age of Onset  . CAD Mother 46    MI  . Hypertension Mother   . Parkinson's disease Father   . Diabetes Father   . CAD Father 23    MI  . Cancer Neg Hx   . Stroke Neg Hx   . Colon cancer Neg Hx     Allergies  Allergen Reactions  . Sulfa Antibiotics Swelling    Causes mouth to swell  . Tricor [Fenofibrate] Other (See Comments)    myalgias  . Contrast Media [Iodinated Diagnostic Agents] Other (See Comments)    Oral contrast caused mouth blisters  . Lipitor [Atorvastatin] Other (See Comments)  myalgias  . Pravastatin Other (See Comments)    myalgias    Current Outpatient Prescriptions on File Prior to Visit  Medication Sig Dispense Refill  . Acetaminophen (ARTHRITIS PAIN RELIEF PO) Take by mouth.    . Cholecalciferol (VITAMIN D3) 1000 UNITS CAPS Take 1 capsule (1,000 Units total) by mouth daily.    . clonazePAM (KLONOPIN) 1 MG tablet Take 1 tablet (1 mg total) by mouth 2 (two) times daily as needed. 60 tablet 3  . Docusate Calcium (STOOL SOFTENER PO) Take by mouth daily.     Marland Kitchen gabapentin (NEURONTIN) 300 MG capsule TAKE 2 CAPSULES BY MOUTH AT BEDTIME, AND 1 CAPSULE IN THE MORNING 90 capsule 2  . MAGNESIUM CITRATE PO Take by mouth.    . Misc Natural Products (SINUS FORMULA PO) Take by mouth.    . Multiple Vitamin (MULTIVITAMIN) tablet Take 1 tablet by mouth daily.    . Omega-3 Fatty Acids (FISH OIL) 1000 MG CAPS Take one by mouth two times a  day    . pantoprazole (PROTONIX) 40 MG tablet Take 1 tablet (40 mg total) by mouth daily. (Patient taking differently: Take 20 mg by mouth daily. ) 90 tablet 1  . Red Yeast Rice 600 MG CAPS Take 1 capsule by mouth 2 (two) times daily.    . sertraline (ZOLOFT) 100 MG tablet Take 1 tablet (100 mg total) by mouth daily. 90 tablet 3  . traMADol (ULTRAM) 50 MG tablet Take 1 tablet (50 mg total) by mouth 3 (three) times daily as needed for moderate pain. 120 tablet 0   No current facility-administered medications on file prior to visit.    BP 130/76 mmHg  Pulse 58  Temp(Src) 98.1 F (36.7 C) (Oral)  Wt 168 lb 12.8 oz (76.567 kg)  SpO2 98%    Objective:   Physical Exam  Constitutional: Ann White appears well-nourished.  Cardiovascular: Normal rate and regular rhythm.   Pulmonary/Chest: Effort normal and breath sounds normal.  Abdominal: Soft. Normal appearance and bowel sounds are normal. There is tenderness in the suprapubic area. There is no CVA tenderness.  Skin: Skin is warm and dry.          Assessment & Plan:  Urinary tract infection:  Urinary frequency, dysuria, pelvic pressure for 2-3 days. No improvement with over-the-counter Cystex. Does not appear acutely ill, exam mostly unremarkable. UA: 3+ leuks, 1+ blood, negative nitrites. Culture sent. Given symptoms and presence of leukocytes and blood will initiate treatment.. Prescription for Keflex seven-day course sent to pharmacy. Discussed use of AZO and to increase consumption of water intake. Ann White is to notify me if no improvement in symptoms in 3-4 days.

## 2016-01-10 NOTE — Progress Notes (Signed)
Pre visit review using our clinic review tool, if applicable. No additional management support is needed unless otherwise documented below in the visit note. 

## 2016-01-12 LAB — URINE CULTURE
Colony Count: NO GROWTH
Organism ID, Bacteria: NO GROWTH

## 2016-02-06 ENCOUNTER — Other Ambulatory Visit: Payer: Self-pay | Admitting: Family Medicine

## 2016-02-06 NOTE — Telephone Encounter (Signed)
Rx called in as directed.   

## 2016-02-06 NOTE — Telephone Encounter (Signed)
plz phone in. 

## 2016-02-06 NOTE — Telephone Encounter (Signed)
Ok to refill 

## 2016-02-09 ENCOUNTER — Ambulatory Visit (INDEPENDENT_AMBULATORY_CARE_PROVIDER_SITE_OTHER): Payer: Medicare HMO | Admitting: Internal Medicine

## 2016-02-09 ENCOUNTER — Encounter: Payer: Self-pay | Admitting: Internal Medicine

## 2016-02-09 VITALS — BP 112/70 | HR 64 | Temp 98.7°F | Wt 168.0 lb

## 2016-02-09 DIAGNOSIS — R1013 Epigastric pain: Secondary | ICD-10-CM

## 2016-02-09 DIAGNOSIS — R1032 Left lower quadrant pain: Secondary | ICD-10-CM | POA: Diagnosis not present

## 2016-02-09 DIAGNOSIS — R1012 Left upper quadrant pain: Secondary | ICD-10-CM

## 2016-02-09 DIAGNOSIS — R109 Unspecified abdominal pain: Secondary | ICD-10-CM

## 2016-02-09 LAB — POC URINALSYSI DIPSTICK (AUTOMATED)
Bilirubin, UA: NEGATIVE
Blood, UA: NEGATIVE
Glucose, UA: NEGATIVE
Ketones, UA: NEGATIVE
Nitrite, UA: NEGATIVE
Protein, UA: NEGATIVE
Spec Grav, UA: 1.025
Urobilinogen, UA: 4
pH, UA: 6

## 2016-02-09 MED ORDER — METRONIDAZOLE 500 MG PO TABS: 500.0000 mg | ORAL_TABLET | Freq: Three times a day (TID) | ORAL | 0 refills | Status: DC

## 2016-02-09 MED ORDER — CIPROFLOXACIN HCL 500 MG PO TABS: 500.0000 mg | ORAL_TABLET | Freq: Two times a day (BID) | ORAL | 0 refills | Status: DC

## 2016-02-09 NOTE — Progress Notes (Signed)
Pre visit review using our clinic review tool, if applicable. No additional management support is needed unless otherwise documented below in the visit note. 

## 2016-02-09 NOTE — Progress Notes (Signed)
Subjective:    Patient ID: Ann White, female    DOB: [birth year], 70 y.o.   MRN: [id number]  HPI  Pt presents to the clinic today with c/o left lower quadrant pain. This started 2 days ago. She describes the pain as sharp and stabbing. Her bowels are moving normally. She has not noticed any blood in her stool. She denies nausea or vomiting. She has put a heating pad on the area with minimal relief. Colonoscopy from 2014 showed diverticulosis. She denies urinary or vaginal complaints. She was sedn for urinary symptoms 2 weeks ago, treated with Keflex but stopped when urine culture did not grow any bacteria. She has had a total hysterectomy.  Review of Systems      Past Medical History:  Diagnosis Date  . Arthritis   . Cervical spondylosis    s/p spine injections  . Depression   . Ectatic abdominal aorta ([place]) 2016   rpt [place] 5 yrs  . GERD (gastroesophageal reflux disease)   . History of chicken pox   . History of diverticulitis of colon   . HLD (hyperlipidemia)   . HTN (hypertension)   . Irritable bowel syndrome with constipation   . Lichen sclerosus et atrophicus   . Obesity   . Seasonal allergic rhinitis     Current Outpatient Prescriptions  Medication Sig Dispense Refill  . Acetaminophen (ARTHRITIS PAIN RELIEF PO) Take by mouth.    . Cholecalciferol (VITAMIN D3) 1000 UNITS CAPS Take 1 capsule (1,000 Units total) by mouth daily.    . clonazePAM (KLONOPIN) 1 MG tablet TAKE 1 TABLET BY MOUTH TWICE DAILY AS NEEDED 60 tablet 0  . Docusate Calcium (STOOL SOFTENER PO) Take by mouth daily.     Marland Kitchen gabapentin (NEURONTIN) 300 MG capsule TAKE 2 CAPSULES BY MOUTH AT BEDTIME, AND 1 CAPSULE IN THE MORNING 90 capsule 2  . MAGNESIUM CITRATE PO Take by mouth.    . Misc Natural Products (SINUS FORMULA PO) Take by mouth.    . Multiple Vitamin (MULTIVITAMIN) tablet Take 1 tablet by mouth daily.    . Omega-3 Fatty Acids (FISH OIL) 1000 MG CAPS Take one by mouth two times a day    .  pantoprazole (PROTONIX) 40 MG tablet Take 1 tablet (40 mg total) by mouth daily. (Patient taking differently: Take 20 mg by mouth daily. ) 90 tablet 1  . Red Yeast Rice 600 MG CAPS Take 1 capsule by mouth 2 (two) times daily.    . sertraline (ZOLOFT) 100 MG tablet Take 1 tablet (100 mg total) by mouth daily. 90 tablet 3  . traMADol (ULTRAM) 50 MG tablet Take 1 tablet (50 mg total) by mouth 3 (three) times daily as needed for moderate pain. 120 tablet 0  . vitamin B-12 (CYANOCOBALAMIN) 1000 MCG tablet Take 1,000 mcg by mouth daily.     No current facility-administered medications for this visit.     Allergies  Allergen Reactions  . Sulfa Antibiotics Swelling    Causes mouth to swell  . Tricor [Fenofibrate] Other (See Comments)    myalgias  . Contrast Media [Iodinated Diagnostic Agents] Other (See Comments)    Oral contrast caused mouth blisters  . Lipitor [Atorvastatin] Other (See Comments)    myalgias  . Pravastatin Other (See Comments)    myalgias    Family History  Problem Relation Age of Onset  . CAD Mother 62    MI  . Hypertension Mother   . Parkinson's disease Father   .  Diabetes Father   . CAD Father 75    MI  . Cancer Neg Hx   . Stroke Neg Hx   . Colon cancer Neg Hx     Social History   Social History  . Marital status: Married    Spouse name: N/A  . Number of children: N/A  . Years of education: N/A   Occupational History  . Not on file.   Social History Main Topics  . Smoking status: Never Smoker  . Smokeless tobacco: Never Used     Comment: minimal smoking history  . Alcohol use No  . Drug use: No  . Sexual activity: Not on file   Other Topics Concern  . Not on file   Social History Narrative   Lives with husband and 2 cats.  2 grown children, 5 grandchildren.   Occupation: retired, worked in [employer]   Activity: no regular exercise.  Does walk in summer   Diet: fruits/vegetables daily, good water.     Constitutional: Denies fever, malaise,  fatigue, headache or abrupt weight changes.  Cardiovascular: Denies chest pain, chest tightness, palpitations or swelling in the hands or feet.  Gastrointestinal: Pt reports abdominal pain. Denies bloating, constipation, diarrhea or blood in the stool.  GU: Denies urgency, frequency, pain with urination, burning sensation, blood in urine, odor or discharge.  No other specific complaints in a complete review of systems (except as listed in HPI above).  Objective:   Physical Exam  BP 112/70   Pulse 64   Temp 98.7 F (37.1 C) (Oral)   Wt 168 lb (76.2 kg)   SpO2 95%   BMI 30.98 kg/m  Wt Readings from Last 3 Encounters:  02/09/16 168 lb (76.2 kg)  01/10/16 168 lb 12.8 oz (76.6 kg)  09/28/15 172 lb (78 kg)    General: Appears her stated age, well developed, well nourished in NAD. Cardiovascular: Normal rate and rhythm. S1,S2 noted.   Pulmonary/Chest: Normal effort and positive vesicular breath sounds. No respiratory distress. No wheezes, rales or ronchi noted.  Abdomen: Soft and tender in the epigastric area, LUQ and LLQ. Normal bowel sounds. No distention or masses noted. Slight CVA tenderness noted on the left. Neurological: Alert and oriented.  BMET    Component Value Date/Time   NA 141 06/29/2015 1014   K 4.1 06/29/2015 1014   CL 104 06/29/2015 1014   CO2 27 06/29/2015 1014   GLUCOSE 83 06/29/2015 1014   BUN 25 (H) 06/29/2015 1014   CREATININE 1.37 (H) 06/29/2015 1014   CREATININE 1.19 07/26/2011   CALCIUM 9.6 06/29/2015 1014   GFRNONAA >60 08/23/2008 1350   GFRAA  08/23/2008 1350    >60        The eGFR has been calculated using the MDRD equation. This calculation has not been validated in all clinical situations. eGFR's persistently <60 mL/min signify possible Chronic Kidney Disease.    Lipid Panel     Component Value Date/Time   CHOL 250 (H) 03/16/2015 1139   TRIG 233.0 (H) 03/16/2015 1139   HDL 46.20 03/16/2015 1139   CHOLHDL 5 03/16/2015 1139   VLDL 46.6  (H) 03/16/2015 1139    CBC    Component Value Date/Time   WBC 4.2 06/29/2015 1014   RBC 3.76 (L) 06/29/2015 1014   HGB 11.0 (L) 06/29/2015 1014   HCT 33.3 (L) 06/29/2015 1014   PLT 253.0 06/29/2015 1014   MCV 88.6 06/29/2015 1014   MCHC 33.1 06/29/2015 1014   RDW  14.1 06/29/2015 1014   LYMPHSABS 1.1 06/29/2015 1014   MONOABS 0.2 06/29/2015 1014   EOSABS 0.2 06/29/2015 1014   BASOSABS 0.0 06/29/2015 1014    Hgb A1C No results found for: HGBA1C          Assessment & Plan:   Epigastric, LUQ, LLQ and left flank pain:  Urinalysis: trace leuks No need to send urine culture Will go ahead and treat for acute diverticulitis with Cipro and Flagyl x 7 days Handout given on diet for diverticulitis   RTC as needed or if symptoms persist or worsen BAITY, REGINA, NP

## 2016-02-09 NOTE — Patient Instructions (Signed)
Diverticulitis °Diverticulitis is inflammation or infection of small pouches in your colon that form when you have a condition called diverticulosis. The pouches in your colon are called diverticula. Your colon, or large intestine, is where water is absorbed and stool is formed. °Complications of diverticulitis can include: °· Bleeding. °· Severe infection. °· Severe pain. °· Perforation of your colon. °· Obstruction of your colon. °CAUSES  °Diverticulitis is caused by bacteria. °Diverticulitis happens when stool becomes trapped in diverticula. This allows bacteria to grow in the diverticula, which can lead to inflammation and infection. °RISK FACTORS °People with diverticulosis are at risk for diverticulitis. Eating a diet that does not include enough fiber from fruits and vegetables may make diverticulitis more likely to develop. °SYMPTOMS  °Symptoms of diverticulitis may include: °· Abdominal pain and tenderness. The pain is normally located on the left side of the abdomen, but may occur in other areas. °· Fever and chills. °· Bloating. °· Cramping. °· Nausea. °· Vomiting. °· Constipation. °· Diarrhea. °· Blood in your stool. °DIAGNOSIS  °Your health care provider will ask you about your medical history and do a physical exam. You may need to have tests done because many medical conditions can cause the same symptoms as diverticulitis. Tests may include: °· Blood tests. °· Urine tests. °· Imaging tests of the abdomen, including X-rays and CT scans. °When your condition is under control, your health care provider may recommend that you have a colonoscopy. A colonoscopy can show how severe your diverticula are and whether something else is causing your symptoms. °TREATMENT  °Most cases of diverticulitis are mild and can be treated at home. Treatment may include: °· Taking over-the-counter pain medicines. °· Following a clear liquid diet. °· Taking antibiotic medicines by mouth for 7-10 days. °More severe cases may  be treated at a hospital. Treatment may include: °· Not eating or drinking. °· Taking prescription pain medicine. °· Receiving antibiotic medicines through an IV tube. °· Receiving fluids and nutrition through an IV tube. °· Surgery. °HOME CARE INSTRUCTIONS  °· Follow your health care provider's instructions carefully. °· Follow a full liquid diet or other diet as directed by your health care provider. After your symptoms improve, your health care provider may tell you to change your diet. He or she may recommend you eat a high-fiber diet. Fruits and vegetables are good sources of fiber. Fiber makes it easier to pass stool. °· Take fiber supplements or probiotics as directed by your health care provider. °· Only take medicines as directed by your health care provider. °· Keep all your follow-up appointments. °SEEK MEDICAL CARE IF:  °· Your pain does not improve. °· You have a hard time eating food. °· Your bowel movements do not return to normal. °SEEK IMMEDIATE MEDICAL CARE IF:  °· Your pain becomes worse. °· Your symptoms do not get better. °· Your symptoms suddenly get worse. °· You have a fever. °· You have repeated vomiting. °· You have bloody or black, tarry stools. °MAKE SURE YOU:  °· Understand these instructions. °· Will watch your condition. °· Will get help right away if you are not doing well or get worse. °  °This information is not intended to replace advice given to you by your health care provider. Make sure you discuss any questions you have with your health care provider. °  °Document Released: 04/11/2005 Document Revised: 07/07/2013 Document Reviewed: 05/27/2013 °Elsevier Interactive Patient Education ©2016 [employer] ° °

## 2016-02-21 ENCOUNTER — Other Ambulatory Visit: Payer: Self-pay | Admitting: Family Medicine

## 2016-02-21 ENCOUNTER — Other Ambulatory Visit (INDEPENDENT_AMBULATORY_CARE_PROVIDER_SITE_OTHER): Payer: Medicare HMO

## 2016-02-21 ENCOUNTER — Ambulatory Visit (INDEPENDENT_AMBULATORY_CARE_PROVIDER_SITE_OTHER): Payer: Medicare HMO

## 2016-02-21 VITALS — BP 120/80 | HR 52 | Temp 98.0°F | Ht 61.5 in | Wt 166.0 lb

## 2016-02-21 DIAGNOSIS — E538 Deficiency of other specified B group vitamins: Secondary | ICD-10-CM

## 2016-02-21 DIAGNOSIS — Z0000 Encounter for general adult medical examination without abnormal findings: Secondary | ICD-10-CM

## 2016-02-21 DIAGNOSIS — I10 Essential (primary) hypertension: Secondary | ICD-10-CM

## 2016-02-21 DIAGNOSIS — N183 Chronic kidney disease, stage 3 unspecified: Secondary | ICD-10-CM

## 2016-02-21 DIAGNOSIS — E785 Hyperlipidemia, unspecified: Secondary | ICD-10-CM

## 2016-02-21 DIAGNOSIS — Z1159 Encounter for screening for other viral diseases: Secondary | ICD-10-CM

## 2016-02-21 DIAGNOSIS — R7989 Other specified abnormal findings of blood chemistry: Secondary | ICD-10-CM

## 2016-02-21 LAB — CBC WITH DIFFERENTIAL/PLATELET
Basophils Absolute: 0 10*3/uL (ref 0.0–0.1)
Basophils Relative: 0.6 % (ref 0.0–3.0)
Eosinophils Absolute: 0.2 10*3/uL (ref 0.0–0.7)
Eosinophils Relative: 4.2 % (ref 0.0–5.0)
HCT: 34.4 % — ABNORMAL LOW (ref 36.0–46.0)
Hemoglobin: 11.8 g/dL — ABNORMAL LOW (ref 12.0–15.0)
Lymphocytes Relative: 36.9 % (ref 12.0–46.0)
Lymphs Abs: 2 10*3/uL (ref 0.7–4.0)
MCHC: 34.4 g/dL (ref 30.0–36.0)
MCV: 86.2 fl (ref 78.0–100.0)
Monocytes Absolute: 0.2 10*3/uL (ref 0.1–1.0)
Monocytes Relative: 3.9 % (ref 3.0–12.0)
Neutro Abs: 2.9 10*3/uL (ref 1.4–7.7)
Neutrophils Relative %: 54.4 % (ref 43.0–77.0)
Platelets: 211 10*3/uL (ref 150.0–400.0)
RBC: 3.99 Mil/uL (ref 3.87–5.11)
RDW: 14.6 % (ref 11.5–15.5)
WBC: 5.4 10*3/uL (ref 4.0–10.5)

## 2016-02-21 LAB — LIPID PANEL
Cholesterol: 255 mg/dL — ABNORMAL HIGH (ref 0–200)
HDL: 52.1 mg/dL (ref >39.00)
NonHDL: 203.11
Total CHOL/HDL Ratio: 5
Triglycerides: 268 mg/dL — ABNORMAL HIGH (ref 0.0–149.0)
VLDL: 53.6 mg/dL — ABNORMAL HIGH (ref 0.0–40.0)

## 2016-02-21 LAB — RENAL FUNCTION PANEL
Albumin: 4.7 g/dL (ref 3.5–5.2)
BUN: 26 mg/dL — ABNORMAL HIGH (ref 6–23)
CO2: 29 mEq/L (ref 19–32)
Calcium: 10.2 mg/dL (ref 8.4–10.5)
Chloride: 101 mEq/L (ref 96–112)
Creatinine, Ser: 1.06 mg/dL (ref 0.40–1.20)
GFR: 54.66 mL/min — ABNORMAL LOW (ref >60.00)
Glucose, Bld: 87 mg/dL (ref 70–99)
Phosphorus: 4.1 mg/dL (ref 2.3–4.6)
Potassium: 4.6 mEq/L (ref 3.5–5.1)
Sodium: 140 mEq/L (ref 135–145)

## 2016-02-21 LAB — VITAMIN B12: Vitamin B-12: 632 pg/mL (ref 211–911)

## 2016-02-21 LAB — LDL CHOLESTEROL, DIRECT: Direct LDL: 155 mg/dL

## 2016-02-21 NOTE — Progress Notes (Signed)
PCP notes:   Health maintenance:  Hep C screening - completed Flu vaccine - addressed  Abnormal screenings:   Hearing - failed  Patient concerns:   Pt has complaint of right elbow pain that occurs when she lifts objects. Onset approx. 3-4 weeks ago.   Nurse concerns:  None  Next PCP appt:   02/28/16 @ 0915

## 2016-02-21 NOTE — Progress Notes (Signed)
Pre visit review using our clinic review tool, if applicable. No additional management support is needed unless otherwise documented below in the visit note. 

## 2016-02-21 NOTE — Patient Instructions (Signed)
Ann White , Thank you for taking time to come for your Medicare Wellness Visit. I appreciate your ongoing commitment to your health goals. Please review the following plan we discussed and let me know if I can assist you in the future.   These are the goals we discussed: Goals    . Increase physical activity          Starting 02/21/2016, I will continue to exercise for at least 60 min twice weekly as schedule permits.        This is a list of the screening recommended for you and due dates:  Health Maintenance  Topic Date Due  . Flu Shot  07/15/2016*  . Mammogram  10/10/2016  . Colon Cancer Screening  07/02/2018  . DTaP/Tdap/Td vaccine (2 - Td) 08/27/2022  . Tetanus Vaccine  08/27/2022  . DEXA scan (bone density measurement)  Completed  . Shingles Vaccine  Completed  .  Hepatitis C: One time screening is recommended by Center for Disease Control  (CDC) for  adults born from [birth year] through 1964.   Completed  . Pneumonia vaccines  Completed  *Topic was postponed. The date shown is not the original due date.   Preventive Care for Adults  A healthy lifestyle and preventive care can promote health and wellness. Preventive health guidelines for adults include the following key practices.  . A routine yearly physical is a good way to check with your health care provider about your health and preventive screening. It is a chance to share any concerns and updates on your health and to receive a thorough exam.  . Visit your dentist for a routine exam and preventive care every 6 months. Brush your teeth twice a day and floss once a day. Good oral hygiene prevents tooth decay and gum disease.  . The frequency of eye exams is based on your age, health, family medical history, use  of contact lenses, and other factors. Follow your health care provider's ecommendations for frequency of eye exams.  . Eat a healthy diet. Foods like vegetables, fruits, whole grains, low-fat dairy products, and lean  protein foods contain the nutrients you need without too many calories. Decrease your intake of foods high in solid fats, added sugars, and salt. Eat the right amount of calories for you. Get information about a proper diet from your health care provider, if necessary.  . Regular physical exercise is one of the most important things you can do for your health. Most adults should get at least 150 minutes of moderate-intensity exercise (any activity that increases your heart rate and causes you to sweat) each week. In addition, most adults need muscle-strengthening exercises on 2 or more days a week.  Silver Sneakers may be a benefit available to you. To determine eligibility, you may visit the website: www.silversneakers.com or contact program at 901-603-3219 Mon-Fri between 8AM-8PM.   . Maintain a healthy weight. The body mass index (BMI) is a screening tool to identify possible weight problems. It provides an estimate of body fat based on height and weight. Your health care provider can find your BMI and can help you achieve or maintain a healthy weight.   For adults 20 years and older: ? A BMI below 18.5 is considered underweight. ? A BMI of 18.5 to 24.9 is normal. ? A BMI of 25 to 29.9 is considered overweight. ? A BMI of 30 and above is considered obese.   . Maintain normal blood lipids and cholesterol levels by  exercising and minimizing your intake of saturated fat. Eat a balanced diet with plenty of fruit and vegetables. Blood tests for lipids and cholesterol should begin at age 68 and be repeated every 5 years. If your lipid or cholesterol levels are high, you are over 50, or you are at high risk for heart disease, you may need your cholesterol levels checked more frequently. Ongoing high lipid and cholesterol levels should be treated with medicines if diet and exercise are not working.  . If you smoke, find out from your health care provider how to quit. If you do not use tobacco, please  do not start.  . If you choose to drink alcohol, please do not consume more than 2 drinks per day. One drink is considered to be 12 ounces (355 mL) of beer, 5 ounces (148 mL) of wine, or 1.5 ounces (44 mL) of liquor.  . If you are 68-28 years old, ask your health care provider if you should take aspirin to prevent strokes.  . Use sunscreen. Apply sunscreen liberally and repeatedly throughout the day. You should seek shade when your shadow is shorter than you. Protect yourself by wearing long sleeves, pants, a wide-brimmed hat, and sunglasses year round, whenever you are outdoors.  . Once a month, do a whole body skin exam, using a mirror to look at the skin on your back. Tell your health care provider of new moles, moles that have irregular borders, moles that are larger than a pencil eraser, or moles that have changed in shape or color.

## 2016-02-21 NOTE — Progress Notes (Signed)
Subjective:   Ann White is a 70 y.o. female who presents for Medicare Annual (Subsequent) preventive examination.  Review of Systems:  N/A Cardiac Risk Factors include: advanced age (>2men, >48 women);dyslipidemia;hypertension;obesity (BMI >30kg/m2)     Objective:     Vitals: BP 120/80 (BP Location: Right Arm, Patient Position: Sitting, Cuff Size: Normal)   Pulse (!) 52   Temp 98 F (36.7 C) (Oral)   Ht 5' 1.5" (1.562 m) Comment: no shoes  Wt 166 lb (75.3 kg)   SpO2 94%   BMI 30.86 kg/m   Body mass index is 30.86 kg/m.   Tobacco History  Smoking Status  . Never Smoker  Smokeless Tobacco  . Never Used    Comment: minimal smoking history     Counseling given: No   Past Medical History:  Diagnosis Date  . Arthritis   . Cervical spondylosis    s/p spine injections  . Depression   . Ectatic abdominal aorta ([facility]) 2016   rpt [place] 5 yrs  . GERD (gastroesophageal reflux disease)   . History of chicken pox   . History of diverticulitis of colon   . HLD (hyperlipidemia)   . HTN (hypertension)   . Irritable bowel syndrome with constipation   . Lichen sclerosus et atrophicus   . Obesity   . Seasonal allergic rhinitis    Past Surgical History:  Procedure Laterality Date  . bladder tack  1990s  . BREAST BIOPSY  2013   at [facility], benign  . CARPAL TUNNEL RELEASE  2004, 2013   bilateral  . CHOLECYSTECTOMY  1990s  . COLONOSCOPY  2002  . COLONOSCOPY  06/2013   mod diverticulosis, 3 tubular adenomas, int hem rpt 5 yrs [facility])  . DEXA  07/2011   normal, T score -0.3  . ESOPHAGOGASTRODUODENOSCOPY ENDOSCOPY  2004   normal [facility])  . TOTAL ABDOMINAL HYSTERECTOMY W/ BILATERAL SALPINGOOPHORECTOMY  1990s   complete, dysmenorrhea and fibroids   Family History  Problem Relation Age of Onset  . CAD Mother 23    MI  . Hypertension Mother   . Parkinson's disease Father   . Diabetes Father   . CAD Father 9    MI  . Cancer Neg Hx   . Stroke Neg Hx   . Colon  cancer Neg Hx    History  Sexual Activity  . Sexual activity: No    Outpatient Encounter Prescriptions as of 02/21/2016  Medication Sig  . Acetaminophen (ARTHRITIS PAIN RELIEF PO) Take by mouth.  . Cholecalciferol (VITAMIN D3) 1000 UNITS CAPS Take 1 capsule (1,000 Units total) by mouth daily.  . clonazePAM (KLONOPIN) 1 MG tablet TAKE 1 TABLET BY MOUTH TWICE DAILY AS NEEDED  . Docusate Calcium (STOOL SOFTENER PO) Take by mouth daily.   Marland Kitchen gabapentin (NEURONTIN) 300 MG capsule TAKE 2 CAPSULES BY MOUTH AT BEDTIME, AND 1 CAPSULE IN THE MORNING  . MAGNESIUM CITRATE PO Take by mouth.  . Misc Natural Products (SINUS FORMULA PO) Take by mouth.  . Multiple Vitamin (MULTIVITAMIN) tablet Take 1 tablet by mouth daily.  . Omega-3 Fatty Acids (FISH OIL) 1000 MG CAPS Take one by mouth two times a day  . pantoprazole (PROTONIX) 40 MG tablet Take 1 tablet (40 mg total) by mouth daily. (Patient taking differently: Take 20 mg by mouth daily. )  . Red Yeast Rice 600 MG CAPS Take 1 capsule by mouth 2 (two) times daily.  . sertraline (ZOLOFT) 100 MG tablet Take 1 tablet (  100 mg total) by mouth daily.  . traMADol (ULTRAM) 50 MG tablet Take 1 tablet (50 mg total) by mouth 3 (three) times daily as needed for moderate pain.  . vitamin B-12 (CYANOCOBALAMIN) 1000 MCG tablet Take 1,000 mcg by mouth daily.  . [DISCONTINUED] ciprofloxacin (CIPRO) 500 MG tablet Take 1 tablet (500 mg total) by mouth 2 (two) times daily.  . [DISCONTINUED] metroNIDAZOLE (FLAGYL) 500 MG tablet Take 1 tablet (500 mg total) by mouth 3 (three) times daily.   No facility-administered encounter medications on file as of 02/21/2016.     Activities of Daily Living In your present state of health, do you have any difficulty performing the following activities: 02/21/2016  Hearing? N  Vision? N  Difficulty concentrating or making decisions? N  Walking or climbing stairs? N  Dressing or bathing? N  Doing errands, shopping? N  Preparing Food and  eating ? N  Using the Toilet? N  In the past six months, have you accidently leaked urine? N  Do you have problems with loss of bowel control? N  Managing your Medications? N  Managing your Finances? N  Housekeeping or managing your Housekeeping? N  Some recent data might be hidden    Patient Care Team: Ria Bush, MD as PCP - General (Family Medicine) Jovita Gamma, MD as Consulting Physician (Neurosurgery) Neil Crouch, OD as Referring Physician (Optometry)    Assessment:     Hearing Screening   125Hz  250Hz  500Hz  1000Hz  2000Hz  3000Hz  4000Hz  6000Hz  8000Hz   Right ear:   0 40 40  0    Left ear:   0 0 40  0    Vision Screening Comments: Last vision exam approx. 8 months ago with Dr. Renaldo Fiddler   Exercise Activities and Dietary recommendations Current Exercise Habits: Home exercise routine, Type of exercise: Other - see comments (silver sneakers), Time (Minutes): 60, Frequency (Times/Week): 2, Weekly Exercise (Minutes/Week): 120, Intensity: Moderate, Exercise limited by: None identified  Goals    . Increase physical activity          Starting 02/21/2016, I will continue to exercise for at least 60 min twice weekly as schedule permits.       Fall Risk Fall Risk  02/21/2016 03/16/2015 08/28/2013 08/27/2012  Falls in the past year? No No No No   Depression Screen PHQ 2/9 Scores 02/21/2016 03/16/2015 08/28/2013 08/27/2012  PHQ - 2 Score 0 0 0 0     Cognitive Testing MMSE - Mini Mental State Exam 02/21/2016  Orientation to time 5  Orientation to Place 5  Registration 3  Attention/ Calculation 0  Recall 3  Language- name 2 objects 0  Language- repeat 1  Language- follow 3 step command 3  Language- read & follow direction 0  Write a sentence 0  Copy design 0  Total score 20   PLEASE NOTE: A Mini-Cog screen was completed. Maximum score is 20. A value of 0 denotes this part of Folstein MMSE was not completed or the patient failed this part of the Mini-Cog screening.   Mini-Cog  Screening Orientation to Time - Max 5 pts Orientation to Place - Max 5 pts Registration - Max 3 pts Recall - Max 3 pts Language Repeat - Max 1 pts Language Follow 3 Step Command - Max 3 pts   Immunization History  Administered Date(s) Administered  . Influenza,inj,Quad PF,36+ Mos 03/16/2015  . Influenza-Unspecified 04/15/2013, 04/15/2014  . Pneumococcal Conjugate-13 08/28/2013  . Pneumococcal Polysaccharide-23 08/27/2012  . Tdap 08/27/2012  .  Zoster 07/16/2010   Screening Tests Health Maintenance  Topic Date Due  . INFLUENZA VACCINE  07/15/2016 (Originally 02/14/2016)  . MAMMOGRAM  10/10/2016  . COLONOSCOPY  07/02/2018  . DTaP/Tdap/Td (2 - Td) 08/27/2022  . TETANUS/TDAP  08/27/2022  . DEXA SCAN  Completed  . ZOSTAVAX  Completed  . Hepatitis C Screening  Completed  . PNA vac Low Risk Adult  Completed      Plan:     I have personally reviewed and addressed the Medicare Annual Wellness questionnaire and have noted the following in the patient's chart:  A. Medical and social history B. Use of alcohol, tobacco or illicit drugs  C. Current medications and supplements D. Functional ability and status E.  Nutritional status F.  Physical activity G. Advance directives H. List of other physicians I.  Hospitalizations, surgeries, and ER visits in previous 12 months J.  [place] to include hearing, vision, cognitive, depression L. Referrals and appointments - none  In addition, I have reviewed and discussed with patient certain preventive protocols, quality metrics, and best practice recommendations. A written personalized care plan for preventive services as well as general preventive health recommendations were provided to patient.  See attached scanned questionnaire for additional information.   Signed,   Lindell Noe, MHA, BS, LPN Health Advisor

## 2016-02-22 LAB — HEPATITIS C ANTIBODY: HCV Ab: NEGATIVE

## 2016-02-26 NOTE — Progress Notes (Signed)
I reviewed health advisor's note, was available for consultation, and agree with documentation and plan.  

## 2016-02-28 ENCOUNTER — Ambulatory Visit (INDEPENDENT_AMBULATORY_CARE_PROVIDER_SITE_OTHER): Payer: Medicare HMO | Admitting: Family Medicine

## 2016-02-28 ENCOUNTER — Encounter: Payer: Self-pay | Admitting: Family Medicine

## 2016-02-28 VITALS — BP 142/78 | HR 63 | Ht 61.5 in | Wt 166.0 lb

## 2016-02-28 DIAGNOSIS — Z86718 Personal history of other venous thrombosis and embolism: Secondary | ICD-10-CM

## 2016-02-28 DIAGNOSIS — K219 Gastro-esophageal reflux disease without esophagitis: Secondary | ICD-10-CM | POA: Diagnosis not present

## 2016-02-28 DIAGNOSIS — M7710 Lateral epicondylitis, unspecified elbow: Secondary | ICD-10-CM | POA: Insufficient documentation

## 2016-02-28 DIAGNOSIS — R0989 Other specified symptoms and signs involving the circulatory and respiratory systems: Secondary | ICD-10-CM

## 2016-02-28 DIAGNOSIS — M7711 Lateral epicondylitis, right elbow: Secondary | ICD-10-CM

## 2016-02-28 DIAGNOSIS — Z7189 Other specified counseling: Secondary | ICD-10-CM | POA: Diagnosis not present

## 2016-02-28 DIAGNOSIS — E66811 Obesity, class 1: Secondary | ICD-10-CM

## 2016-02-28 DIAGNOSIS — Z0000 Encounter for general adult medical examination without abnormal findings: Secondary | ICD-10-CM

## 2016-02-28 DIAGNOSIS — Z0001 Encounter for general adult medical examination with abnormal findings: Secondary | ICD-10-CM | POA: Diagnosis not present

## 2016-02-28 DIAGNOSIS — J8410 Pulmonary fibrosis, unspecified: Secondary | ICD-10-CM

## 2016-02-28 DIAGNOSIS — N183 Chronic kidney disease, stage 3 unspecified: Secondary | ICD-10-CM

## 2016-02-28 DIAGNOSIS — E785 Hyperlipidemia, unspecified: Secondary | ICD-10-CM

## 2016-02-28 DIAGNOSIS — E669 Obesity, unspecified: Secondary | ICD-10-CM

## 2016-02-28 DIAGNOSIS — I10 Essential (primary) hypertension: Secondary | ICD-10-CM

## 2016-02-28 DIAGNOSIS — F331 Major depressive disorder, recurrent, moderate: Secondary | ICD-10-CM

## 2016-02-28 DIAGNOSIS — I77811 Abdominal aortic ectasia: Secondary | ICD-10-CM

## 2016-02-28 MED ORDER — PANTOPRAZOLE SODIUM 40 MG PO TBEC: 40.0000 mg | DELAYED_RELEASE_TABLET | Freq: Every day | ORAL | 3 refills | Status: DC

## 2016-02-28 MED ORDER — LOVASTATIN 10 MG PO TABS: 10.0000 mg | ORAL_TABLET | ORAL | 11 refills | Status: DC

## 2016-02-28 MED ORDER — SERTRALINE HCL 100 MG PO TABS: 100.0000 mg | ORAL_TABLET | Freq: Every day | ORAL | 3 refills | Status: DC

## 2016-02-28 NOTE — Progress Notes (Signed)
BP (!) 142/78   Pulse 63   Ht 5' 1.5" (1.562 m)   Wt 166 lb (75.3 kg)   SpO2 95%   BMI 30.86 kg/m    CC: CPE Subjective:    Patient ID: Ann White, female    DOB: 11-28-1945, 70 y.o.   MRN: [id number]  HPI: [name] is a 70 y.o. female presenting on 02/28/2016 for Annual Exam and Medication Refill (pantoprazole and sertraline )   Saw Katha Cabal last week for medicare wellness visit, note reviewed. Failed hearing L>R. Pt doesn't notice hearing difficulty.   3-4 wk h/o R elbow pain worse with heavy lifting. Points to lateral elbwo. Initially some swelling. Denies inciting trauma/injury.   HLD - intolerant to statins and fibrates. On fish oil and RYR 600mg  bid.   Acute DVT while on estrogen therapy 05/2015 s/p 4 mo coumadin therapy.   Preventative: Colon cancer screening - colonoscopy 06/2013 mod diverticulosis, 3 tubular adenomas, int hem rpt 5 yrs [facility]) Well woman - with prior PCP Dr. Rocky Link. S/p hysterectomy and oophorectomy for benign reason. 1990s.  Mammogram - 09/2015 DEXA 2013 - WNL Flu shot 04/2013 Pneumovax - 2014. prevnar 08/2013 Tdap 2014 Shingles shot 2012.  Advanced directives: has set up through lawyer. Husband then daughter are HCPOA. Asked to bring me copy. Seat belt use discussed Sunscreen use discussed. No changing moles on skin Not around smokers.  No EtOH use.   Lives with husband and 2 cats. 2 grown children, 5 grandchildren.  Occupation: retired, worked in [employer]. Activity: no regular exercise. Does walk in summer  Diet: fruits/vegetables daily, good water.  Relevant past medical, surgical, family and social history reviewed and updated as indicated. Interim medical history since our last visit reviewed. Allergies and medications reviewed and updated. Current Outpatient Prescriptions on File Prior to Visit  Medication Sig  . Acetaminophen (ARTHRITIS PAIN RELIEF PO) Take by mouth.  . Cholecalciferol (VITAMIN D3) 1000 UNITS CAPS Take 1 capsule  (1,000 Units total) by mouth daily.  . clonazePAM (KLONOPIN) 1 MG tablet TAKE 1 TABLET BY MOUTH TWICE DAILY AS NEEDED  . Docusate Calcium (STOOL SOFTENER PO) Take by mouth daily.   Marland Kitchen gabapentin (NEURONTIN) 300 MG capsule TAKE 2 CAPSULES BY MOUTH AT BEDTIME, AND 1 CAPSULE IN THE MORNING  . MAGNESIUM CITRATE PO Take by mouth.  . Misc Natural Products (SINUS FORMULA PO) Take by mouth.  . Multiple Vitamin (MULTIVITAMIN) tablet Take 1 tablet by mouth daily.  . Omega-3 Fatty Acids (FISH OIL) 1000 MG CAPS Take one by mouth two times a day  . Red Yeast Rice 600 MG CAPS Take 1 capsule by mouth 2 (two) times daily.  . traMADol (ULTRAM) 50 MG tablet Take 1 tablet (50 mg total) by mouth 3 (three) times daily as needed for moderate pain.  . vitamin B-12 (CYANOCOBALAMIN) 1000 MCG tablet Take 1,000 mcg by mouth daily.   No current facility-administered medications on file prior to visit.     Review of Systems  Constitutional: Negative for activity change, appetite change, chills, fatigue, fever and unexpected weight change.  HENT: Negative for hearing loss.   Eyes: Negative for visual disturbance.  Respiratory: Negative for cough, chest tightness, shortness of breath and wheezing.   Cardiovascular: Negative for chest pain, palpitations and leg swelling.  Gastrointestinal: Positive for abdominal pain (recent presumed diverticulitis). Negative for abdominal distention, blood in stool, constipation, diarrhea, nausea and vomiting.  Genitourinary: Negative for difficulty urinating and hematuria.  Musculoskeletal: Negative for  arthralgias, myalgias and neck pain.  Skin: Negative for rash.  Neurological: Positive for headaches (sinus). Negative for dizziness, seizures and syncope.  Hematological: Negative for adenopathy. Does not bruise/bleed easily.  Psychiatric/Behavioral: Negative for dysphoric mood. The patient is not nervous/anxious.    Per HPI unless specifically indicated in ROS section       Objective:    BP (!) 142/78   Pulse 63   Ht 5' 1.5" (1.562 m)   Wt 166 lb (75.3 kg)   SpO2 95%   BMI 30.86 kg/m   Wt Readings from Last 3 Encounters:  02/28/16 166 lb (75.3 kg)  02/21/16 166 lb (75.3 kg)  02/09/16 168 lb (76.2 kg)    Physical Exam  Constitutional: She is oriented to person, place, and time. She appears well-developed and well-nourished. No distress.  HENT:  Head: Normocephalic and atraumatic.  Right Ear: Hearing, tympanic membrane, external ear and ear canal normal.  Left Ear: Hearing, tympanic membrane, external ear and ear canal normal.  Nose: Nose normal.  Mouth/Throat: Uvula is midline, oropharynx is clear and moist and mucous membranes are normal. No oropharyngeal exudate, posterior oropharyngeal edema or posterior oropharyngeal erythema.  Eyes: Conjunctivae and EOM are normal. Pupils are equal, round, and reactive to light. No scleral icterus.  Neck: Normal range of motion. Neck supple. Carotid bruit is present (R). No thyromegaly present.  Cardiovascular: Normal rate, regular rhythm, normal heart sounds and intact distal pulses.   No murmur heard. Pulses:      Radial pulses are 2+ on the right side, and 2+ on the left side.  Pulmonary/Chest: Effort normal and breath sounds normal. No respiratory distress. She has no wheezes. She has no rales.  Abdominal: Soft. Bowel sounds are normal. She exhibits no distension and no mass. There is no tenderness. There is no rebound and no guarding.  Musculoskeletal: Normal range of motion. She exhibits no edema.  2+ rad pulses bilaterally Tender to palpation R lateral epicondyle with mild swelling present Pain with testing wrist extender against resistance  Lymphadenopathy:    She has no cervical adenopathy.  Neurological: She is alert and oriented to person, place, and time.  CN grossly intact, station and gait intact  Skin: Skin is warm and dry. No rash noted.  Psychiatric: She has a normal mood and affect. Her  behavior is normal. Judgment and thought content normal.  Nursing note and vitals reviewed.  Results for orders placed or performed in visit on 02/21/16  Vitamin B12  Result Value Ref Range   Vitamin B-12 632 211 - 911 pg/mL  Lipid panel  Result Value Ref Range   Cholesterol 255 (H) 0 - 200 mg/dL   Triglycerides 268.0 (H) 0.0 - 149.0 mg/dL   HDL 52.10 >39.00 mg/dL   VLDL 53.6 (H) 0.0 - 40.0 mg/dL   Total CHOL/HDL Ratio 5    NonHDL 203.11   Renal function panel  Result Value Ref Range   Sodium 140 135 - 145 mEq/L   Potassium 4.6 3.5 - 5.1 mEq/L   Chloride 101 96 - 112 mEq/L   CO2 29 19 - 32 mEq/L   Calcium 10.2 8.4 - 10.5 mg/dL   Albumin 4.7 3.5 - 5.2 g/dL   BUN 26 (H) 6 - 23 mg/dL   Creatinine, Ser 1.06 0.40 - 1.20 mg/dL   Glucose, Bld 87 70 - 99 mg/dL   Phosphorus 4.1 2.3 - 4.6 mg/dL   GFR 54.66 (L) >60.00 mL/min  Hepatitis C antibody  Result  Value Ref Range   HCV Ab NEGATIVE NEGATIVE  CBC with Differential/Platelet  Result Value Ref Range   WBC 5.4 4.0 - 10.5 K/uL   RBC 3.99 3.87 - 5.11 Mil/uL   Hemoglobin 11.8 (L) 12.0 - 15.0 g/dL   HCT 34.4 (L) 36.0 - 46.0 %   MCV 86.2 78.0 - 100.0 fl   MCHC 34.4 30.0 - 36.0 g/dL   RDW 14.6 11.5 - 15.5 %   Platelets 211.0 150.0 - 400.0 K/uL   Neutrophils Relative % 54.4 43.0 - 77.0 %   Lymphocytes Relative 36.9 12.0 - 46.0 %   Monocytes Relative 3.9 3.0 - 12.0 %   Eosinophils Relative 4.2 0.0 - 5.0 %   Basophils Relative 0.6 0.0 - 3.0 %   Neutro Abs 2.9 1.4 - 7.7 K/uL   Lymphs Abs 2.0 0.7 - 4.0 K/uL   Monocytes Absolute 0.2 0.1 - 1.0 K/uL   Eosinophils Absolute 0.2 0.0 - 0.7 K/uL   Basophils Absolute 0.0 0.0 - 0.1 K/uL  LDL cholesterol, direct  Result Value Ref Range   Direct LDL 155.0 mg/dL      Assessment & Plan:   Problem List Items Addressed This Visit    Advanced care planning/counseling discussion    Advanced directives: has set up through lawyer. Husband then daughter are HCPOA. Asked to bring me copy.       CKD (chronic kidney disease) stage 3, GFR 30-59 ml/min    Unclear etiology s/p normal abd [place], SPEP. Regardless, now improving with latest GFR 54%. Continue to monitor.       Ectatic abdominal aorta (HCC)    Continue aiming for better cholesterol control.       Relevant Medications   lovastatin (MEVACOR) 10 MG tablet (Start on 02/29/2016)   GERD (gastroesophageal reflux disease)    PPI refilled. Discussed trial QOD Dosing.       Relevant Medications   pantoprazole (PROTONIX) 40 MG tablet   Health maintenance examination - Primary    Preventative protocols reviewed and updated unless pt declined. Discussed healthy diet and lifestyle.       HLD (hyperlipidemia)    Intolerant to previous statins.  On RYR BID, fish oil daily.  ASCVD 10 yr risk = 10.9% Suggested trial lovastatin 10mg  MWF. If intolerant, may return to RYR.       Relevant Medications   lovastatin (MEVACOR) 10 MG tablet (Start on 02/29/2016)   HTN (hypertension)    Chronic, mildly elevated today off lisinopril. Will recheck.      Relevant Medications   lovastatin (MEVACOR) 10 MG tablet (Start on 02/29/2016)   MDD (major depressive disorder), recurrent episode, moderate (HCC)    Desires to continue current sertraline dose.       Relevant Medications   sertraline (ZOLOFT) 100 MG tablet   Obesity, Class I, BMI 30-34.9    Congratulated on weight loss to date. Pt motivated to continue healthy diet/lifestyle changes.      Personal history of DVT (deep vein thrombosis)    Completed 4 mo coumadin for first provoked symptomatic distal DVT. Now off estrogen.       PULMONARY FIBROSIS, POSTINFLAMMATORY    Unclear history. Lungs clear today.      Tennis elbow    Discussed dx and treatment. Handout provided. Tennis elbow strap provided today as well.        Other Visit Diagnoses    Right carotid bruit       Relevant Orders   VAS  US CAROTID       Follow up plan: Return in about 6 months (around 08/30/2016), or  as needed, for follow up visit.  Ria Bush, MD

## 2016-02-28 NOTE — Assessment & Plan Note (Addendum)
Unclear history. Lungs clear today.

## 2016-02-28 NOTE — Assessment & Plan Note (Signed)
PPI refilled. Discussed trial QOD Dosing.

## 2016-02-28 NOTE — Assessment & Plan Note (Signed)
Discussed dx and treatment. Handout provided. Tennis elbow strap provided today as well.

## 2016-02-28 NOTE — Assessment & Plan Note (Signed)
Continue aiming for better cholesterol control.

## 2016-02-28 NOTE — Assessment & Plan Note (Signed)
Unclear etiology s/p normal abd [place], SPEP. Regardless, now improving with latest GFR 54%. Continue to monitor.

## 2016-02-28 NOTE — Assessment & Plan Note (Signed)
Desires to continue current sertraline dose.

## 2016-02-28 NOTE — Progress Notes (Signed)
Pre visit review using our clinic review tool, if applicable. No additional management support is needed unless otherwise documented below in the visit note. 

## 2016-02-28 NOTE — Assessment & Plan Note (Signed)
Completed 4 mo coumadin for first provoked symptomatic distal DVT. Now off estrogen.

## 2016-02-28 NOTE — Assessment & Plan Note (Signed)
Chronic, mildly elevated today off lisinopril. Will recheck.

## 2016-02-28 NOTE — Assessment & Plan Note (Signed)
Congratulated on weight loss to date. Pt motivated to continue healthy diet/lifestyle changes.

## 2016-02-28 NOTE — Assessment & Plan Note (Signed)
Preventative protocols reviewed and updated unless pt declined. Discussed healthy diet and lifestyle.  

## 2016-02-28 NOTE — Assessment & Plan Note (Signed)
Intolerant to previous statins.  On RYR BID, fish oil daily.  ASCVD 10 yr risk = 10.9% Suggested trial lovastatin 10mg  MWF. If intolerant, may return to RYR.

## 2016-02-28 NOTE — Patient Instructions (Addendum)
Bring me copy of advanced directive to update your chart. We will schedule carotid artery ultrasound. Trial lovastatin low dose MWF to see if tolerated for better cholesterol control. If not tolerated, stop and return to red yeast rice.  For right tennis elbow - use tennis elbow strap, ice/heating pad and rest elbow. Let us know if not improving with this.  Good to see you today, call us with questions.  Health Maintenance, Female Adopting a healthy lifestyle and getting preventive care can go a long way to promote health and wellness. Talk with your health care provider about what schedule of regular examinations is right for you. This is a good chance for you to check in with your provider about disease prevention and staying healthy. In between checkups, there are plenty of things you can do on your own. Experts have done a lot of research about which lifestyle changes and preventive measures are most likely to keep you healthy. Ask your health care provider for more information. WEIGHT AND DIET  Eat a healthy diet  Be sure to include plenty of vegetables, fruits, low-fat dairy products, and lean protein.  Do not eat a lot of foods high in solid fats, added sugars, or salt.  Get regular exercise. This is one of the most important things you can do for your health.  Most adults should exercise for at least 150 minutes each week. The exercise should increase your heart rate and make you sweat (moderate-intensity exercise).  Most adults should also do strengthening exercises at least twice a week. This is in addition to the moderate-intensity exercise.  Maintain a healthy weight  Body mass index (BMI) is a measurement that can be used to identify possible weight problems. It estimates body fat based on height and weight. Your health care provider can help determine your BMI and help you achieve or maintain a healthy weight.  For females 78 years of age and older:   A BMI below 18.5 is  considered underweight.  A BMI of 18.5 to 24.9 is normal.  A BMI of 25 to 29.9 is considered overweight.  A BMI of 30 and above is considered obese.  Watch levels of cholesterol and blood lipids  You should start having your blood tested for lipids and cholesterol at 69 years of age, then have this test every 5 years.  You may need to have your cholesterol levels checked more often if:  Your lipid or cholesterol levels are high.  You are older than 69 years of age.  You are at high risk for heart disease.  CANCER SCREENING   Lung Cancer  Lung cancer screening is recommended for adults 22-49 years old who are at high risk for lung cancer because of a history of smoking.  A yearly low-dose CT scan of the lungs is recommended for people who:  Currently smoke.  Have quit within the past 15 years.  Have at least a 30-pack-year history of smoking. A pack year is smoking an average of one pack of cigarettes a day for 1 year.  Yearly screening should continue until it has been 15 years since you quit.  Yearly screening should stop if you develop a health problem that would prevent you from having lung cancer treatment.  Breast Cancer  Practice breast self-awareness. This means understanding how your breasts normally appear and feel.  It also means doing regular breast self-exams. Let your health care provider know about any changes, no matter how small.  If  you are in your 20s or 30s, you should have a clinical breast exam (CBE) by a health care provider every 1-3 years as part of a regular health exam.  If you are 76 or older, have a CBE every year. Also consider having a breast X-ray (mammogram) every year.  If you have a family history of breast cancer, talk to your health care provider about genetic screening.  If you are at high risk for breast cancer, talk to your health care provider about having an MRI and a mammogram every year.  Breast cancer gene (BRCA)  assessment is recommended for women who have family members with BRCA-related cancers. BRCA-related cancers include:  Breast.  Ovarian.  Tubal.  Peritoneal cancers.  Results of the assessment will determine the need for genetic counseling and BRCA1 and BRCA2 testing. Cervical Cancer Your health care provider may recommend that you be screened regularly for cancer of the pelvic organs (ovaries, uterus, and vagina). This screening involves a pelvic examination, including checking for microscopic changes to the surface of your cervix (Pap test). You may be encouraged to have this screening done every 3 years, beginning at age 32.  For women ages 52-65, health care providers may recommend pelvic exams and Pap testing every 3 years, or they may recommend the Pap and pelvic exam, combined with testing for human papilloma virus (HPV), every 5 years. Some types of HPV increase your risk of cervical cancer. Testing for HPV may also be done on women of any age with unclear Pap test results.  Other health care providers may not recommend any screening for nonpregnant women who are considered low risk for pelvic cancer and who do not have symptoms. Ask your health care provider if a screening pelvic exam is right for you.  If you have had past treatment for cervical cancer or a condition that could lead to cancer, you need Pap tests and screening for cancer for at least 20 years after your treatment. If Pap tests have been discontinued, your risk factors (such as having a new sexual partner) need to be reassessed to determine if screening should resume. Some women have medical problems that increase the chance of getting cervical cancer. In these cases, your health care provider may recommend more frequent screening and Pap tests. Colorectal Cancer  This type of cancer can be detected and often prevented.  Routine colorectal cancer screening usually begins at 69 years of age and continues through 69 years  of age.  Your health care provider may recommend screening at an earlier age if you have risk factors for colon cancer.  Your health care provider may also recommend using home test kits to check for hidden blood in the stool.  A small camera at the end of a tube can be used to examine your colon directly (sigmoidoscopy or colonoscopy). This is done to check for the earliest forms of colorectal cancer.  Routine screening usually begins at age 63.  Direct examination of the colon should be repeated every 5-10 years through 69 years of age. However, you may need to be screened more often if early forms of precancerous polyps or small growths are found. Skin Cancer  Check your skin from head to toe regularly.  Tell your health care provider about any new moles or changes in moles, especially if there is a change in a mole's shape or color.  Also tell your health care provider if you have a mole that is larger than the size  of a pencil eraser.  Always use sunscreen. Apply sunscreen liberally and repeatedly throughout the day.  Protect yourself by wearing long sleeves, pants, a wide-brimmed hat, and sunglasses whenever you are outside. HEART DISEASE, DIABETES, AND HIGH BLOOD PRESSURE   High blood pressure causes heart disease and increases the risk of stroke. High blood pressure is more likely to develop in:  People who have blood pressure in the high end of the normal range (130-139/85-89 mm Hg).  People who are overweight or obese.  People who are African American.  If you are 47-27 years of age, have your blood pressure checked every 3-5 years. If you are 81 years of age or older, have your blood pressure checked every year. You should have your blood pressure measured twice--once when you are at a hospital or clinic, and once when you are not at a hospital or clinic. Record the average of the two measurements. To check your blood pressure when you are not at a hospital or clinic, you  can use:  An automated blood pressure machine at a pharmacy.  A home blood pressure monitor.  If you are between 74 years and 85 years old, ask your health care provider if you should take aspirin to prevent strokes.  Have regular diabetes screenings. This involves taking a blood sample to check your fasting blood sugar level.  If you are at a normal weight and have a low risk for diabetes, have this test once every three years after 69 years of age.  If you are overweight and have a high risk for diabetes, consider being tested at a younger age or more often. PREVENTING INFECTION  Hepatitis B  If you have a higher risk for hepatitis B, you should be screened for this virus. You are considered at high risk for hepatitis B if:  You were born in a country where hepatitis B is common. Ask your health care provider which countries are considered high risk.  Your parents were born in a high-risk country, and you have not been immunized against hepatitis B (hepatitis B vaccine).  You have HIV or AIDS.  You use needles to inject street drugs.  You live with someone who has hepatitis B.  You have had sex with someone who has hepatitis B.  You get hemodialysis treatment.  You take certain medicines for conditions, including cancer, organ transplantation, and autoimmune conditions. Hepatitis C  Blood testing is recommended for:  Everyone born from [birth year] through 1964.  Anyone with known risk factors for hepatitis C. Sexually transmitted infections (STIs)  You should be screened for sexually transmitted infections (STIs) including gonorrhea and chlamydia if:  You are sexually active and are younger than 69 years of age.  You are older than 69 years of age and your health care provider tells you that you are at risk for this type of infection.  Your sexual activity has changed since you were last screened and you are at an increased risk for chlamydia or gonorrhea. Ask your health  care provider if you are at risk.  If you do not have HIV, but are at risk, it may be recommended that you take a prescription medicine daily to prevent HIV infection. This is called pre-exposure prophylaxis (PrEP). You are considered at risk if:  You are sexually active and do not regularly use condoms or know the HIV status of your partner(s).  You take drugs by injection.  You are sexually active with a partner who has  HIV. Talk with your health care provider about whether you are at high risk of being infected with HIV. If you choose to begin PrEP, you should first be tested for HIV. You should then be tested every 3 months for as long as you are taking PrEP.  PREGNANCY   If you are premenopausal and you may become pregnant, ask your health care provider about preconception counseling.  If you may become pregnant, take 400 to 800 micrograms (mcg) of folic acid every day.  If you want to prevent pregnancy, talk to your health care provider about birth control (contraception). OSTEOPOROSIS AND MENOPAUSE   Osteoporosis is a disease in which the bones lose minerals and strength with aging. This can result in serious bone fractures. Your risk for osteoporosis can be identified using a bone density scan.  If you are 72 years of age or older, or if you are at risk for osteoporosis and fractures, ask your health care provider if you should be screened.  Ask your health care provider whether you should take a calcium or vitamin D supplement to lower your risk for osteoporosis.  Menopause may have certain physical symptoms and risks.  Hormone replacement therapy may reduce some of these symptoms and risks. Talk to your health care provider about whether hormone replacement therapy is right for you.  HOME CARE INSTRUCTIONS   Schedule regular health, dental, and eye exams.  Stay current with your immunizations.   Do not use any tobacco products including cigarettes, chewing tobacco, or  electronic cigarettes.  If you are pregnant, do not drink alcohol.  If you are breastfeeding, limit how much and how often you drink alcohol.  Limit alcohol intake to no more than 1 drink per day for nonpregnant women. One drink equals 12 ounces of beer, 5 ounces of wine, or 1 ounces of hard liquor.  Do not use street drugs.  Do not share needles.  Ask your health care provider for help if you need support or information about quitting drugs.  Tell your health care provider if you often feel depressed.  Tell your health care provider if you have ever been abused or do not feel safe at home.   This information is not intended to replace advice given to you by your health care provider. Make sure you discuss any questions you have with your health care provider.   Document Released: 01/15/2011 Document Revised: 07/23/2014 Document Reviewed: 06/03/2013 Elsevier Interactive Patient Education [employer].

## 2016-02-28 NOTE — Assessment & Plan Note (Signed)
Advanced directives: has set up through lawyer. Husband then daughter are HCPOA. Asked to bring me copy. 

## 2016-03-07 ENCOUNTER — Ambulatory Visit: Payer: Medicare HMO

## 2016-03-07 DIAGNOSIS — R0989 Other specified symptoms and signs involving the circulatory and respiratory systems: Secondary | ICD-10-CM | POA: Diagnosis not present

## 2016-03-14 ENCOUNTER — Encounter: Payer: Self-pay | Admitting: *Deleted

## 2016-03-14 ENCOUNTER — Encounter: Payer: Self-pay | Admitting: Family Medicine

## 2016-03-14 DIAGNOSIS — I6529 Occlusion and stenosis of unspecified carotid artery: Secondary | ICD-10-CM

## 2016-03-14 DIAGNOSIS — I6523 Occlusion and stenosis of bilateral carotid arteries: Secondary | ICD-10-CM | POA: Insufficient documentation

## 2016-03-14 HISTORY — DX: Occlusion and stenosis of unspecified carotid artery: I65.29

## 2016-03-19 ENCOUNTER — Other Ambulatory Visit: Payer: Self-pay | Admitting: Family Medicine

## 2016-03-20 NOTE — Telephone Encounter (Signed)
Last filled 02-06-16 #60 Last OV 02-28-16 No Future OV

## 2016-03-21 NOTE — Telephone Encounter (Signed)
plz phone in. 

## 2016-03-21 NOTE — Telephone Encounter (Signed)
Left refill on voice mail at pharmacy  

## 2016-04-17 DIAGNOSIS — R69 Illness, unspecified: Secondary | ICD-10-CM | POA: Diagnosis not present

## 2016-04-20 ENCOUNTER — Other Ambulatory Visit: Payer: Self-pay | Admitting: Family Medicine

## 2016-04-20 NOTE — Telephone Encounter (Signed)
Ok to refill? Last filled 03/21/16 #60 0RF

## 2016-04-20 NOTE — Telephone Encounter (Signed)
plz phone in. 

## 2016-04-20 NOTE — Telephone Encounter (Signed)
Rx called in as directed.   

## 2016-05-23 ENCOUNTER — Other Ambulatory Visit: Payer: Self-pay | Admitting: Internal Medicine

## 2016-05-31 ENCOUNTER — Other Ambulatory Visit: Payer: Self-pay | Admitting: Family Medicine

## 2016-05-31 MED ORDER — CLONAZEPAM 1 MG PO TABS: 1.0000 mg | ORAL_TABLET | Freq: Two times a day (BID) | ORAL | 0 refills | Status: DC | PRN

## 2016-05-31 NOTE — Telephone Encounter (Signed)
Rx called in as directed. Patient notified.  

## 2016-05-31 NOTE — Telephone Encounter (Signed)
Ok to refill? Last filled 04/20/16 #60 0RF. I have not received a request for this via e-scribe or fax.

## 2016-05-31 NOTE — Telephone Encounter (Signed)
Patient's husband called and said patient is waiting at the pharmacy.

## 2016-05-31 NOTE — Telephone Encounter (Signed)
See other request

## 2016-05-31 NOTE — Telephone Encounter (Signed)
plz phone in. plz notify we never received refill request

## 2016-05-31 NOTE — Telephone Encounter (Signed)
Pt came in requesting clonazepam 1 mg tablet. Pt has been out for 4 days. Pt states her pharmacy has requested the medication but I do not see where. Please advise..   [facility] [address]  Please call pt when med called in/ [phone]

## 2016-05-31 NOTE — Telephone Encounter (Signed)
[facility] calls earlier this afternoon to report that they still have not received the order yet for clonazepam.   Per attached note, I see that it was called in at 4pm and have confirmed receipt by pharmacy.

## 2016-06-27 ENCOUNTER — Other Ambulatory Visit: Payer: Self-pay

## 2016-06-27 NOTE — Telephone Encounter (Signed)
Pt left v/m requesting refill [facility] [address]. Last refilled # 60 on 05/31/16. Pt last seen 02/28/16.

## 2016-06-28 MED ORDER — CLONAZEPAM 1 MG PO TABS: 1.0000 mg | ORAL_TABLET | Freq: Two times a day (BID) | ORAL | 0 refills | Status: DC | PRN

## 2016-06-28 NOTE — Telephone Encounter (Signed)
Please call in.  Thanks.   

## 2016-06-28 NOTE — Telephone Encounter (Signed)
Rx called in as directed.   

## 2016-07-02 ENCOUNTER — Encounter: Payer: Self-pay | Admitting: Family Medicine

## 2016-07-02 ENCOUNTER — Ambulatory Visit (INDEPENDENT_AMBULATORY_CARE_PROVIDER_SITE_OTHER): Payer: Medicare HMO | Admitting: Family Medicine

## 2016-07-02 VITALS — BP 140/80 | HR 62 | Temp 99.2°F | Ht 61.5 in | Wt 167.5 lb

## 2016-07-02 DIAGNOSIS — J189 Pneumonia, unspecified organism: Secondary | ICD-10-CM

## 2016-07-02 DIAGNOSIS — J181 Lobar pneumonia, unspecified organism: Secondary | ICD-10-CM | POA: Diagnosis not present

## 2016-07-02 MED ORDER — CEFDINIR 300 MG PO CAPS: 600.0000 mg | ORAL_CAPSULE | Freq: Every day | ORAL | 0 refills | Status: DC

## 2016-07-02 NOTE — Progress Notes (Signed)
Pre visit review using our clinic review tool, if applicable. No additional management support is needed unless otherwise documented below in the visit note. 

## 2016-07-02 NOTE — Progress Notes (Signed)
Dr. Frederico Hamman T. Aletta Edmunds, MD, [facility] [place], [address] Phone: [phone] Fax: [phone]  07/02/2016  Patient: Ann White, MRN: [id number], DOB: 1946-06-07, 70 y.o.  Primary Physician:  Ria Bush, MD   Chief Complaint  Patient presents with  . Cough    x 1 week with yellow phelgm  . Wheezing  . Nasal Congestion   Subjective:   Ann White is a 70 y.o. very pleasant female patient who presents with the following:  Very pleasant lady who is had a worsening cough over the last week who is had some wheezing and altered lung sounds that she can hear audibly as well as some nasal congestion.  She denies any specific lung changes.  Nonsmoker. Cough deep - some wheezing in her chest.   Her chart does list pulmonary fibrosis, postinflammatory is a condition.  Past Medical History, Surgical History, Social History, Family History, Problem List, Medications, and Allergies have been reviewed and updated if relevant.  Patient Active Problem List   Diagnosis Date Noted  . Carotid stenosis 03/14/2016  . Health maintenance examination 02/28/2016  . Tennis elbow 02/28/2016  . Right knee pain 08/31/2015  . Right sided sciatica 07/15/2015  . Anemia, unspecified 06/29/2015  . Chronic right maxillary sinusitis 06/29/2015  . Ectatic abdominal aorta ([place])   . Encounter for therapeutic drug monitoring 05/30/2015  . Personal history of DVT (deep vein thrombosis) 05/30/2015  . Advanced care planning/counseling discussion 03/16/2015  . CKD (chronic kidney disease) stage 3, GFR 30-59 ml/min 08/19/2013  . Hot flash, menopausal 02/24/2013  . Obesity, Class I, BMI 30-34.9 02/24/2013  . Medicare annual wellness visit, subsequent 08/27/2012  . Hyperglycemia 08/27/2012  . Lichen sclerosus et atrophicus   . Irritable bowel syndrome with constipation   . HTN (hypertension)   . Seasonal allergic rhinitis   . GERD  (gastroesophageal reflux disease)   . MDD (major depressive disorder), recurrent episode, moderate ([place])   . History of diverticulitis of colon   . HLD (hyperlipidemia)   . PULMONARY FIBROSIS, POSTINFLAMMATORY 09/10/2008  . Cervical spondylosis without myelopathy 09/09/2008    Past Medical History:  Diagnosis Date  . Arthritis   . Carotid stenosis 03/14/2016   Bilateral 40-59%, rpt 1 yr (02/2016)  . Cervical spondylosis    s/p spine injections  . Depression   . Ectatic abdominal aorta ([place]) 2016   rpt [place] 5 yrs  . GERD (gastroesophageal reflux disease)   . History of chicken pox   . History of diverticulitis of colon   . HLD (hyperlipidemia)   . HTN (hypertension)   . Irritable bowel syndrome with constipation   . Lichen sclerosus et atrophicus   . Obesity   . Seasonal allergic rhinitis     Past Surgical History:  Procedure Laterality Date  . bladder tack  1990s  . BREAST BIOPSY  2013   at breast center, benign  . CARPAL TUNNEL RELEASE  2004, 2013   bilateral  . CHOLECYSTECTOMY  1990s  . COLONOSCOPY  2002  . COLONOSCOPY  06/2013   mod diverticulosis, 3 tubular adenomas, int hem rpt 5 yrs [facility])  . DEXA  07/2011   normal, T score -0.3  . ESOPHAGOGASTRODUODENOSCOPY ENDOSCOPY  2004   normal [facility])  . TOTAL ABDOMINAL HYSTERECTOMY W/ BILATERAL SALPINGOOPHORECTOMY  1990s   complete, dysmenorrhea and fibroids    Social History   Social History  . Marital status: Married  Spouse name: N/A  . Number of children: N/A  . Years of education: N/A   Occupational History  . Not on file.   Social History Main Topics  . Smoking status: Never Smoker  . Smokeless tobacco: Never Used     Comment: minimal smoking history  . Alcohol use No  . Drug use: No  . Sexual activity: No   Other Topics Concern  . Not on file   Social History Narrative   Lives with husband and 2 cats.  2 grown children, 5 grandchildren.   Occupation: retired, worked in [employer]   Activity: no  regular exercise.  Does walk in summer   Diet: fruits/vegetables daily, good water.    Family History  Problem Relation Age of Onset  . CAD Mother 70    MI  . Hypertension Mother   . Parkinson's disease Father   . Diabetes Father   . CAD Father 57    MI  . Cancer Neg Hx   . Stroke Neg Hx   . Colon cancer Neg Hx     Allergies  Allergen Reactions  . Sulfa Antibiotics Swelling    Causes mouth to swell  . Tricor [Fenofibrate] Other (See Comments)    myalgias  . Contrast Media [Iodinated Diagnostic Agents] Other (See Comments)    Oral contrast caused mouth blisters  . Lipitor [Atorvastatin] Other (See Comments)    myalgias  . Pravastatin Other (See Comments)    myalgias    Medication list reviewed and updated in full in [facility].  ROS: GEN: Acute illness details above GI: Tolerating PO intake GU: maintaining adequate hydration and urination Pulm: No SOB Interactive and getting along well at home.  Otherwise, ROS is as per the HPI.  Objective:   BP 140/80   Pulse 62   Temp 99.2 F (37.3 C) (Oral)   Ht 5' 1.5" (1.562 m)   Wt 167 lb 8 oz (76 kg)   SpO2 98%   BMI 31.14 kg/m    GEN: A and O x 3. WDWN. NAD.    ENT: Nose clear, ext NML.  No LAD.  No JVD.  TM's clear. Oropharynx clear.  PULM: Normal WOB, no distress. On the left lung fields, upper, more so than lower, there is some rhonchorous sounds with occasional crackles.  There is also some intermittent wheeze.  This is notably much less notable on the right. CV: RRR, no M/G/R, No rubs, No JVD.   EXT: warm and well-perfused, No c/c/e. PSYCH: Pleasant and conversant.    Laboratory and Imaging Data:  Assessment and Plan:   Community acquired pneumonia of left upper lobe of lung ([place])  Given lung exam, think that you have to treat this as if this is a early community-acquired pneumonia.  All explained to the patient.  Viral bronchitis would be less likely.  Follow-up: No Follow-up on file.  Meds  ordered this encounter  Medications  . cefdinir (OMNICEF) 300 MG capsule    Sig: Take 2 capsules (600 mg total) by mouth daily.    Dispense:  20 capsule    Refill:  0   Medications Discontinued During This Encounter  Medication Reason  . Red Yeast Rice 600 MG CAPS Side effect (s)  . lovastatin (MEVACOR) 10 MG tablet Side effect (s)   No orders of the defined types were placed in this encounter.   Signed,  [name]. Ananya Mccleese, MD   Allergies as of 07/02/2016  Reactions   Sulfa Antibiotics Swelling   Causes mouth to swell   Tricor [fenofibrate] Other (See Comments)   myalgias   Contrast Media [iodinated Diagnostic Agents] Other (See Comments)   Oral contrast caused mouth blisters   Lipitor [atorvastatin] Other (See Comments)   myalgias   Pravastatin Other (See Comments)   myalgias      Medication List       Accurate as of 07/02/16 11:59 PM. Always use your most recent med list.          ARTHRITIS PAIN RELIEF PO Take by mouth.   cefdinir 300 MG capsule Commonly known as:  OMNICEF Take 2 capsules (600 mg total) by mouth daily.   clonazePAM 1 MG tablet Commonly known as:  KLONOPIN Take 1 tablet (1 mg total) by mouth 2 (two) times daily as needed.   Fish Oil 1000 MG Caps Take one by mouth two times a day   gabapentin 300 MG capsule Commonly known as:  NEURONTIN TAKE 1 CAPSULE BY MOUTH EVERY MORNING AND 2 CAPSULES BY MOUTH EVERY NIGHT AT BEDTIME   MAGNESIUM CITRATE PO Take by mouth.   multivitamin tablet Take 1 tablet by mouth daily.   pantoprazole 40 MG tablet Commonly known as:  PROTONIX Take 1 tablet (40 mg total) by mouth daily.   sertraline 100 MG tablet Commonly known as:  ZOLOFT Take 1 tablet (100 mg total) by mouth daily.   SINUS FORMULA PO Take by mouth.   STOOL SOFTENER PO Take by mouth daily.   traMADol 50 MG tablet Commonly known as:  ULTRAM Take 1 tablet (50 mg total) by mouth 3 (three) times daily as needed for moderate  pain.   vitamin B-12 1000 MCG tablet Commonly known as:  CYANOCOBALAMIN Take 1,000 mcg by mouth daily.   Vitamin D3 1000 units Caps Take 1 capsule (1,000 Units total) by mouth daily.

## 2016-07-13 ENCOUNTER — Ambulatory Visit (INDEPENDENT_AMBULATORY_CARE_PROVIDER_SITE_OTHER): Payer: Medicare HMO | Admitting: Internal Medicine

## 2016-07-13 ENCOUNTER — Encounter: Payer: Self-pay | Admitting: Internal Medicine

## 2016-07-13 ENCOUNTER — Ambulatory Visit (INDEPENDENT_AMBULATORY_CARE_PROVIDER_SITE_OTHER)
Admission: RE | Admit: 2016-07-13 | Discharge: 2016-07-13 | Disposition: A | Discharge status: Home / Self Care | Payer: Medicare HMO | Source: Ambulatory Visit | Attending: Internal Medicine | Admitting: Internal Medicine

## 2016-07-13 VITALS — BP 114/84 | HR 58 | Temp 98.3°F | Resp 12 | Wt 167.0 lb

## 2016-07-13 DIAGNOSIS — R05 Cough: Secondary | ICD-10-CM

## 2016-07-13 DIAGNOSIS — R051 Acute cough: Secondary | ICD-10-CM | POA: Insufficient documentation

## 2016-07-13 DIAGNOSIS — R059 Cough, unspecified: Secondary | ICD-10-CM | POA: Insufficient documentation

## 2016-07-13 MED ORDER — BENZONATATE 200 MG PO CAPS: 200.0000 mg | ORAL_CAPSULE | Freq: Three times a day (TID) | ORAL | 0 refills | Status: DC | PRN

## 2016-07-13 NOTE — Patient Instructions (Signed)
Please start a probiotic (yogurt with active cultures, align or other tablet)

## 2016-07-13 NOTE — Assessment & Plan Note (Addendum)
May just be post infectious Not clear what an x-ray would have shown at prior visit  CXR shows some lingular atelectasis--but this looks similar to 2010. (those images compared) She feels better  If the radiologist feels there may be an acute process, I will give 7 days of levaquin Otherwise, just benzonatate

## 2016-07-13 NOTE — Progress Notes (Signed)
Subjective:    Patient ID: Ann White, female    DOB: [birth year], 70 y.o.   MRN: [id number]  HPI Here with husband due to ongoing cough Rx for apparent community acquired pneumonia Finished the antibiotic yesterday No longer producing the sputum Cough is still bad--especially if supine  Cough syrup (OTC), mucinex--no help Benzonatate has helped Mostly able to sleep--props  No fever now No sweats or chills No SOB No wheezing since starting the antibiotic  Current Outpatient Prescriptions on File Prior to Visit  Medication Sig Dispense Refill  . Acetaminophen (ARTHRITIS PAIN RELIEF PO) Take by mouth.    . Cholecalciferol (VITAMIN D3) 1000 UNITS CAPS Take 1 capsule (1,000 Units total) by mouth daily.    . clonazePAM (KLONOPIN) 1 MG tablet Take 1 tablet (1 mg total) by mouth 2 (two) times daily as needed. 60 tablet 0  . Docusate Calcium (STOOL SOFTENER PO) Take by mouth daily.     Marland Kitchen gabapentin (NEURONTIN) 300 MG capsule TAKE 1 CAPSULE BY MOUTH EVERY MORNING AND 2 CAPSULES BY MOUTH EVERY NIGHT AT BEDTIME 270 capsule 2  . MAGNESIUM CITRATE PO Take by mouth.    . Misc Natural Products (SINUS FORMULA PO) Take by mouth.    . Multiple Vitamin (MULTIVITAMIN) tablet Take 1 tablet by mouth daily.    . Omega-3 Fatty Acids (FISH OIL) 1000 MG CAPS Take one by mouth two times a day    . pantoprazole (PROTONIX) 40 MG tablet Take 1 tablet (40 mg total) by mouth daily. 90 tablet 3  . sertraline (ZOLOFT) 100 MG tablet Take 1 tablet (100 mg total) by mouth daily. 90 tablet 3  . traMADol (ULTRAM) 50 MG tablet Take 1 tablet (50 mg total) by mouth 3 (three) times daily as needed for moderate pain. 120 tablet 0  . vitamin B-12 (CYANOCOBALAMIN) 1000 MCG tablet Take 1,000 mcg by mouth daily.     No current facility-administered medications on file prior to visit.     Allergies  Allergen Reactions  . Sulfa Antibiotics Swelling    Causes mouth to swell  . Tricor [Fenofibrate] Other (See Comments)    myalgias  . Contrast Media [Iodinated Diagnostic Agents] Other (See Comments)    Oral contrast caused mouth blisters  . Lipitor [Atorvastatin] Other (See Comments)    myalgias  . Pravastatin Other (See Comments)    myalgias    Past Medical History:  Diagnosis Date  . Arthritis   . Carotid stenosis 03/14/2016   Bilateral 40-59%, rpt 1 yr (02/2016)  . Cervical spondylosis    s/p spine injections  . Depression   . Ectatic abdominal aorta ([place]) 2016   rpt [place] 5 yrs  . GERD (gastroesophageal reflux disease)   . History of chicken pox   . History of diverticulitis of colon   . HLD (hyperlipidemia)   . HTN (hypertension)   . Irritable bowel syndrome with constipation   . Lichen sclerosus et atrophicus   . Obesity   . Seasonal allergic rhinitis     Past Surgical History:  Procedure Laterality Date  . bladder tack  1990s  . BREAST BIOPSY  2013   at [facility], benign  . CARPAL TUNNEL RELEASE  2004, 2013   bilateral  . CHOLECYSTECTOMY  1990s  . COLONOSCOPY  2002  . COLONOSCOPY  06/2013   mod diverticulosis, 3 tubular adenomas, int hem rpt 5 yrs [facility])  . DEXA  07/2011   normal, T score -0.3  . ESOPHAGOGASTRODUODENOSCOPY  ENDOSCOPY  2004   normal [facility])  . TOTAL ABDOMINAL HYSTERECTOMY W/ BILATERAL SALPINGOOPHORECTOMY  1990s   complete, dysmenorrhea and fibroids    Family History  Problem Relation Age of Onset  . CAD Mother 50    MI  . Hypertension Mother   . Parkinson's disease Father   . Diabetes Father   . CAD Father 73    MI  . Cancer Neg Hx   . Stroke Neg Hx   . Colon cancer Neg Hx     Social History   Social History  . Marital status: Married    Spouse name: N/A  . Number of children: N/A  . Years of education: N/A   Occupational History  . Not on file.   Social History Main Topics  . Smoking status: Never Smoker  . Smokeless tobacco: Never Used     Comment: minimal smoking history  . Alcohol use No  . Drug use: No  . Sexual activity: No    Other Topics Concern  . Not on file   Social History Narrative   Lives with husband and 2 cats.  2 grown children, 5 grandchildren.   Occupation: retired, worked in [employer]   Activity: no regular exercise.  Does walk in summer   Diet: fruits/vegetables daily, good water.   Review of Systems No vomiting Some loose stools--- about a week Appetite is pretty good    Objective:   Physical Exam  Constitutional: No distress.  occ coarse cough  HENT:  Mouth/Throat: Oropharynx is clear and moist. No oropharyngeal exudate.  Neck: Normal range of motion. Neck supple.  Cardiovascular:  Irregular at first---then regular after cough, deep breaths  Pulmonary/Chest:  Muffled sounds at right base Otherwise normal  No dullness  Lymphadenopathy:    She has no cervical adenopathy.          Assessment & Plan:

## 2016-07-13 NOTE — Progress Notes (Signed)
Pre visit review using our clinic review tool, if applicable. No additional management support is needed unless otherwise documented below in the visit note. 

## 2016-07-17 ENCOUNTER — Ambulatory Visit (INDEPENDENT_AMBULATORY_CARE_PROVIDER_SITE_OTHER): Payer: Medicare HMO | Admitting: Internal Medicine

## 2016-07-17 ENCOUNTER — Encounter: Payer: Self-pay | Admitting: Internal Medicine

## 2016-07-17 VITALS — BP 132/82 | HR 133 | Temp 97.9°F | Wt 168.0 lb

## 2016-07-17 DIAGNOSIS — R102 Pelvic and perineal pain unspecified side: Secondary | ICD-10-CM

## 2016-07-17 DIAGNOSIS — B379 Candidiasis, unspecified: Secondary | ICD-10-CM | POA: Diagnosis not present

## 2016-07-17 DIAGNOSIS — N3001 Acute cystitis with hematuria: Secondary | ICD-10-CM

## 2016-07-17 DIAGNOSIS — R300 Dysuria: Secondary | ICD-10-CM | POA: Diagnosis not present

## 2016-07-17 DIAGNOSIS — T3695XA Adverse effect of unspecified systemic antibiotic, initial encounter: Secondary | ICD-10-CM

## 2016-07-17 LAB — POC URINALSYSI DIPSTICK (AUTOMATED)
Bilirubin, UA: NEGATIVE
Glucose, UA: NEGATIVE
Ketones, UA: NEGATIVE
Nitrite, UA: NEGATIVE
Spec Grav, UA: >=1.03
Urobilinogen, UA: NEGATIVE
pH, UA: 5.5

## 2016-07-17 MED ORDER — CIPROFLOXACIN HCL 250 MG PO TABS: 250.0000 mg | ORAL_TABLET | Freq: Two times a day (BID) | ORAL | 0 refills | Status: DC

## 2016-07-17 MED ORDER — FLUCONAZOLE 150 MG PO TABS: 150.0000 mg | ORAL_TABLET | Freq: Once | ORAL | 0 refills | Status: AC

## 2016-07-17 NOTE — Progress Notes (Signed)
HPI  Pt presents to the clinic today with c/o dysuria and pelvic pressure. Symptoms started this morning. She has also noted blood in her urine. She denies urgency, frequency, urine odor or vaginal discharge. She denies fever, chills, nausea or low back pain. She has not tried anything OTC for this.   Review of Systems  Past Medical History:  Diagnosis Date  . Arthritis   . Carotid stenosis 03/14/2016   Bilateral 40-59%, rpt 1 yr (02/2016)  . Cervical spondylosis    s/p spine injections  . Depression   . Ectatic abdominal aorta ([place]) 2016   rpt [place] 5 yrs  . GERD (gastroesophageal reflux disease)   . History of chicken pox   . History of diverticulitis of colon   . HLD (hyperlipidemia)   . HTN (hypertension)   . Irritable bowel syndrome with constipation   . Lichen sclerosus et atrophicus   . Obesity   . Seasonal allergic rhinitis     Family History  Problem Relation Age of Onset  . CAD Mother 18    MI  . Hypertension Mother   . Parkinson's disease Father   . Diabetes Father   . CAD Father 79    MI  . Cancer Neg Hx   . Stroke Neg Hx   . Colon cancer Neg Hx     Social History   Social History  . Marital status: Married    Spouse name: N/A  . Number of children: N/A  . Years of education: N/A   Occupational History  . Not on file.   Social History Main Topics  . Smoking status: Never Smoker  . Smokeless tobacco: Never Used     Comment: minimal smoking history  . Alcohol use No  . Drug use: No  . Sexual activity: No   Other Topics Concern  . Not on file   Social History Narrative   Lives with husband and 2 cats.  2 grown children, 5 grandchildren.   Occupation: retired, worked in [employer]   Activity: no regular exercise.  Does walk in summer   Diet: fruits/vegetables daily, good water.    Allergies  Allergen Reactions  . Sulfa Antibiotics Swelling    Causes mouth to swell  . Tricor [Fenofibrate] Other (See Comments)    myalgias  . Contrast Media  [Iodinated Diagnostic Agents] Other (See Comments)    Oral contrast caused mouth blisters  . Lipitor [Atorvastatin] Other (See Comments)    myalgias  . Pravastatin Other (See Comments)    myalgias     Constitutional: Denies fever, malaise, fatigue, headache or abrupt weight changes.   GU: Pt reports pelvic pain, blood in her urine and pain with urination. Denies burning sensation,  odor or discharge. Skin: Denies redness, rashes, lesions or ulcercations.   No other specific complaints in a complete review of systems (except as listed in HPI above).    Objective:   Physical Exam BP 132/82   Pulse (!) 133   Temp 97.9 F (36.6 C) (Oral)   Wt 168 lb (76.2 kg)   SpO2 100%   BMI 31.23 kg/m   Wt Readings from Last 3 Encounters:  07/17/16 168 lb (76.2 kg)  07/13/16 167 lb (75.8 kg)  07/02/16 167 lb 8 oz (76 kg)    General: Appears her stated age, in NAD. Cardiovascular: Tachycardic with normal rhythm. S1,S2 noted.   Pulmonary/Chest: Normal effort and positive vesicular breath sounds. No respiratory distress. No wheezes, rales or ronchi noted.  Abdomen: Soft. Normal bowel sounds. No distention or masses noted.  Tender to palpation over the bladder area. No CVA tenderness.       Assessment & Plan:   Pelvic Pressure, Dysuria secondary to UTI: Urinalysis: 3+ leuks, 3+ blood Will send urine culture eRx sent if for Cipro 250 mg BID x 5 days eRxc for Diflucan for abx induced yeast infection OK to take AZO OTC Drink plenty of fluids  RTC as needed or if symptoms persist. Webb Silversmith, NP

## 2016-07-17 NOTE — Patient Instructions (Signed)
Urinary Tract Infection, Adult Introduction A urinary tract infection (UTI) is an infection of any part of the urinary tract. The urinary tract includes the:  Kidneys.  Ureters.  Bladder.  Urethra. These organs make, store, and get rid of pee (urine) in the body. Follow these instructions at home:  Take over-the-counter and prescription medicines only as told by your doctor.  If you were prescribed an antibiotic medicine, take it as told by your doctor. Do not stop taking the antibiotic even if you start to feel better.  Avoid the following drinks:  Alcohol.  Caffeine.  Tea.  Carbonated drinks.  Drink enough fluid to keep your pee clear or pale yellow.  Keep all follow-up visits as told by your doctor. This is important.  Make sure to:  Empty your bladder often and completely. Do not to hold pee for long periods of time.  Empty your bladder before and after sex.  Wipe from front to back after a bowel movement if you are female. Use each tissue one time when you wipe. Contact a doctor if:  You have back pain.  You have a fever.  You feel sick to your stomach (nauseous).  You throw up (vomit).  Your symptoms do not get better after 3 days.  Your symptoms go away and then come back. Get help right away if:  You have very bad back pain.  You have very bad lower belly (abdominal) pain.  You are throwing up and cannot keep down any medicines or water. This information is not intended to replace advice given to you by your health care provider. Make sure you discuss any questions you have with your health care provider. Document Released: 12/19/2007 Document Revised: 12/08/2015 Document Reviewed: 05/23/2015  2017 Elsevier  

## 2016-07-17 NOTE — Addendum Note (Signed)
Addended by: Lurlean Nanny on: 07/17/2016 02:55 PM   Modules accepted: Orders

## 2016-07-19 LAB — URINE CULTURE

## 2016-07-26 ENCOUNTER — Other Ambulatory Visit: Payer: Self-pay

## 2016-07-26 MED ORDER — CLONAZEPAM 1 MG PO TABS: 1.0000 mg | ORAL_TABLET | Freq: Two times a day (BID) | ORAL | 0 refills | Status: DC | PRN

## 2016-07-26 NOTE — Telephone Encounter (Signed)
Rx called in as directed.   

## 2016-07-26 NOTE — Telephone Encounter (Signed)
Pt request refill clonazepam to [facility] [address]. Last refilled # 60 on 06/28/16 and last annual 02/28/16. Pt will be out of med on 07/28/16.

## 2016-07-26 NOTE — Telephone Encounter (Signed)
plz phone in. 

## 2016-08-23 ENCOUNTER — Other Ambulatory Visit: Payer: Self-pay | Admitting: Family Medicine

## 2016-08-23 NOTE — Telephone Encounter (Signed)
plz phone in. 

## 2016-08-23 NOTE — Telephone Encounter (Signed)
Ok to refill? Last filled 07/26/16 #60 0RF

## 2016-08-24 ENCOUNTER — Other Ambulatory Visit: Payer: Self-pay | Admitting: Family Medicine

## 2016-08-24 NOTE — Telephone Encounter (Signed)
Rx called in as directed.   

## 2016-08-31 ENCOUNTER — Other Ambulatory Visit: Payer: Self-pay | Admitting: Family Medicine

## 2016-08-31 DIAGNOSIS — Z1231 Encounter for screening mammogram for malignant neoplasm of breast: Secondary | ICD-10-CM

## 2016-09-17 ENCOUNTER — Encounter: Payer: Self-pay | Admitting: Family Medicine

## 2016-09-17 ENCOUNTER — Encounter (INDEPENDENT_AMBULATORY_CARE_PROVIDER_SITE_OTHER): Payer: Self-pay

## 2016-09-17 ENCOUNTER — Ambulatory Visit (INDEPENDENT_AMBULATORY_CARE_PROVIDER_SITE_OTHER): Payer: Medicare HMO | Admitting: Family Medicine

## 2016-09-17 VITALS — BP 120/86 | HR 45 | Temp 98.9°F | Ht 61.5 in | Wt 166.5 lb

## 2016-09-17 DIAGNOSIS — M1711 Unilateral primary osteoarthritis, right knee: Secondary | ICD-10-CM

## 2016-09-17 DIAGNOSIS — M25561 Pain in right knee: Secondary | ICD-10-CM | POA: Diagnosis not present

## 2016-09-17 MED ORDER — METHYLPREDNISOLONE ACETATE 40 MG/ML IJ SUSP
80.0000 mg | Freq: Once | INTRAMUSCULAR | Status: AC
  Administered 2016-09-17: 80 mg via INTRA_ARTICULAR

## 2016-09-17 NOTE — Progress Notes (Signed)
Dr. Frederico Hamman T. Yevonne Yokum, MD, [facility] [place], [address] Phone: [phone] Fax: [phone]  09/17/2016  Patient: Ann White, MRN: [id number], DOB: 02-Jan-1946, 71 y.o.  Primary Physician:  Ria Bush, MD   Chief Complaint  Patient presents with  . Knee Pain    Right   Subjective:   Ann White is a 71 y.o. very pleasant female patient who presents with the following:  R knee known arthritis.   Injection last year. She has responded well to conservative management thus far.  I did an injection in her right knee last year, and she had relief of symptoms for approximately one year. She currently takes Tylenol, tramadol for pain relief, and these do help.  R knee + pes inj  Past Medical History, Surgical History, Social History, Family History, Problem List, Medications, and Allergies have been reviewed and updated if relevant.  Patient Active Problem List   Diagnosis Date Noted  . Cough 07/13/2016  . Carotid stenosis 03/14/2016  . Health maintenance examination 02/28/2016  . Tennis elbow 02/28/2016  . Right knee pain 08/31/2015  . Right sided sciatica 07/15/2015  . Anemia, unspecified 06/29/2015  . Chronic right maxillary sinusitis 06/29/2015  . Ectatic abdominal aorta ([place])   . Encounter for therapeutic drug monitoring 05/30/2015  . Personal history of DVT (deep vein thrombosis) 05/30/2015  . Advanced care planning/counseling discussion 03/16/2015  . CKD (chronic kidney disease) stage 3, GFR 30-59 ml/min 08/19/2013  . Hot flash, menopausal 02/24/2013  . Obesity, Class I, BMI 30-34.9 02/24/2013  . Medicare annual wellness visit, subsequent 08/27/2012  . Hyperglycemia 08/27/2012  . Lichen sclerosus et atrophicus   . Irritable bowel syndrome with constipation   . HTN (hypertension)   . Seasonal allergic rhinitis   . GERD (gastroesophageal reflux disease)   . MDD (major depressive disorder),  recurrent episode, moderate ([place])   . History of diverticulitis of colon   . HLD (hyperlipidemia)   . PULMONARY FIBROSIS, POSTINFLAMMATORY 09/10/2008  . Cervical spondylosis without myelopathy 09/09/2008    Past Medical History:  Diagnosis Date  . Arthritis   . Carotid stenosis 03/14/2016   Bilateral 40-59%, rpt 1 yr (02/2016)  . Cervical spondylosis    s/p spine injections  . Depression   . Ectatic abdominal aorta ([place]) 2016   rpt [place] 5 yrs  . GERD (gastroesophageal reflux disease)   . History of chicken pox   . History of diverticulitis of colon   . HLD (hyperlipidemia)   . HTN (hypertension)   . Irritable bowel syndrome with constipation   . Lichen sclerosus et atrophicus   . Obesity   . Seasonal allergic rhinitis     Past Surgical History:  Procedure Laterality Date  . bladder tack  1990s  . BREAST BIOPSY  2013   [facility], [facility]  . CARPAL TUNNEL RELEASE  2004, 2013   bilateral  . CHOLECYSTECTOMY  1990s  . COLONOSCOPY  2002  . COLONOSCOPY  06/2013   mod diverticulosis, 3 tubular adenomas, int hem rpt 5 yrs [facility])  . DEXA  07/2011   normal, T score -0.3  . ESOPHAGOGASTRODUODENOSCOPY ENDOSCOPY  2004   normal [facility] Plan)  . TOTAL ABDOMINAL HYSTERECTOMY W/ BILATERAL SALPINGOOPHORECTOMY  1990s   complete, dysmenorrhea and fibroids    Social History   Social History  . Marital status: Married    Spouse name: N/A  . Number of children: N/A  .  Years of education: N/A   Occupational History  . Not on file.   Social History Main Topics  . Smoking status: Never Smoker  . Smokeless tobacco: Never Used     Comment: minimal smoking history  . Alcohol use No  . Drug use: No  . Sexual activity: No   Other Topics Concern  . Not on file   Social History Narrative   Lives with husband and 2 cats.  2 grown children, 5 grandchildren.   Occupation: retired, worked in [employer]   Activity: no regular exercise.  Does walk in summer   Diet: fruits/vegetables daily, good  water.    Family History  Problem Relation Age of Onset  . CAD Mother 23    MI  . Hypertension Mother   . Parkinson's disease Father   . Diabetes Father   . CAD Father 3    MI  . Cancer Neg Hx   . Stroke Neg Hx   . Colon cancer Neg Hx     Allergies  Allergen Reactions  . Sulfa Antibiotics Swelling    Causes mouth to swell  . Tricor [Fenofibrate] Other (See Comments)    myalgias  . Contrast Media [Iodinated Diagnostic Agents] Other (See Comments)    Oral contrast caused mouth blisters  . Lipitor [Atorvastatin] Other (See Comments)    myalgias  . Pravastatin Other (See Comments)    myalgias    Medication list reviewed and updated in full in [place].  GEN: No fevers, chills. Nontoxic. Primarily MSK c/o today. MSK: Detailed in the HPI GI: tolerating PO intake without difficulty Neuro: No numbness, parasthesias, or tingling associated. Otherwise the pertinent positives of the ROS are noted above.   Objective:   BP 120/86   Pulse (!) 45   Temp 98.9 F (37.2 C) (Oral)   Ht 5' 1.5" (1.562 m)   Wt 166 lb 8 oz (75.5 kg)   BMI 30.95 kg/m    GEN: WDWN, NAD, Non-toxic, Alert & Oriented x 3 HEENT: Atraumatic, Normocephalic.  Ears and Nose: No external deformity. EXTR: No clubbing/cyanosis/edema NEURO: Normal gait.  PSYCH: Normally interactive. Conversant. Not depressed or anxious appearing.  Calm demeanor.   Knee:  R Gait: Normal heel toe pattern, antalgia ROM: 0-110 Effusion: mild Echymosis or edema: none Patellar tendon NT Painful PLICA: neg Patellar grind: negative Medial and lateral patellar facet loading: negative medial and lateral joint lines: medial ttp Mcmurray's neg Flexion-pinch neg Varus and valgus stress: stable Lachman: neg Ant and Post drawer: neg Hip abduction, IR, ER: WNL Hip flexion str: 5/5 Hip abd: 5/5 Quad: 5/5 VMO atrophy:No Hamstring concentric and eccentric: 5/5   Radiology: No results found.  Assessment and Plan:     Primary osteoarthritis of right knee  Right knee pain, unspecified chronicity - Plan: methylPREDNISolone acetate (DEPO-MEDROL) injection 80 mg  Arthritis flare with known tricompartmental arthritis.  Knee Injection, R Patient verbally consented to procedure. Risks (including potential rare risk of infection), benefits, and alternatives explained. Sterilely prepped with Chloraprep. Ethyl cholride used for anesthesia. 8 cc Lidocaine 1% mixed with 2 mL Depo-Medrol 40 mg injected using the anteromedial approach without difficulty. No complications with procedure and tolerated well. Patient had decreased pain post-injection.   Follow-up: No Follow-up on file.  Meds ordered this encounter  Medications  . methylPREDNISolone acetate (DEPO-MEDROL) injection 80 mg   Medications Discontinued During This Encounter  Medication Reason  . ciprofloxacin (CIPRO) 250 MG tablet Completed Course   Signed,  Frederico Hamman  Celedonio Savage, MD   Allergies as of 09/17/2016      Reactions   Sulfa Antibiotics Swelling   Causes mouth to swell   Tricor [fenofibrate] Other (See Comments)   myalgias   Contrast Media [iodinated Diagnostic Agents] Other (See Comments)   Oral contrast caused mouth blisters   Lipitor [atorvastatin] Other (See Comments)   myalgias   Pravastatin Other (See Comments)   myalgias      Medication List       Accurate as of 09/17/16 11:59 PM. Always use your most recent med list.          ARTHRITIS PAIN RELIEF PO Take by mouth.   benzonatate 200 MG capsule Commonly known as:  TESSALON Take 1 capsule (200 mg total) by mouth 3 (three) times daily as needed for cough.   clonazePAM 1 MG tablet Commonly known as:  KLONOPIN TAKE ONE TABLET BY MOUTH TWICE DAILY AS NEEDED   Fish Oil 1000 MG Caps Take one by mouth two times a day   gabapentin 300 MG capsule Commonly known as:  NEURONTIN TAKE 1 CAPSULE BY MOUTH EVERY MORNING AND 2 CAPSULES BY MOUTH EVERY NIGHT AT BEDTIME   MAGNESIUM  CITRATE PO Take by mouth.   multivitamin tablet Take 1 tablet by mouth daily.   pantoprazole 40 MG tablet Commonly known as:  PROTONIX Take 1 tablet (40 mg total) by mouth daily.   sertraline 100 MG tablet Commonly known as:  ZOLOFT Take 1 tablet (100 mg total) by mouth daily.   SINUS FORMULA PO Take by mouth.   STOOL SOFTENER PO Take by mouth daily.   traMADol 50 MG tablet Commonly known as:  ULTRAM Take 1 tablet (50 mg total) by mouth 3 (three) times daily as needed for moderate pain.   vitamin B-12 1000 MCG tablet Commonly known as:  CYANOCOBALAMIN Take 1,000 mcg by mouth daily.   Vitamin D3 1000 units Caps Take 1 capsule (1,000 Units total) by mouth daily.

## 2016-09-17 NOTE — Progress Notes (Signed)
Pre visit review using our clinic review tool, if applicable. No additional management support is needed unless otherwise documented below in the visit note. 

## 2016-09-24 ENCOUNTER — Other Ambulatory Visit: Payer: Self-pay | Admitting: Family Medicine

## 2016-09-24 NOTE — Telephone Encounter (Signed)
Ok to refill? Last filled 08/23/16 #60 0RF

## 2016-09-25 ENCOUNTER — Other Ambulatory Visit: Payer: Self-pay | Admitting: Family Medicine

## 2016-09-25 NOTE — Telephone Encounter (Signed)
Rx called in as directed.   

## 2016-09-25 NOTE — Telephone Encounter (Signed)
plz phone in. 

## 2016-10-22 ENCOUNTER — Ambulatory Visit
Admission: RE | Admit: 2016-10-22 | Discharge: 2016-10-22 | Disposition: A | Discharge status: Home / Self Care | Payer: Medicare HMO | Source: Ambulatory Visit | Attending: Family Medicine | Admitting: Family Medicine

## 2016-10-22 DIAGNOSIS — Z1231 Encounter for screening mammogram for malignant neoplasm of breast: Secondary | ICD-10-CM

## 2016-10-22 LAB — HM MAMMOGRAPHY

## 2016-10-23 ENCOUNTER — Encounter: Payer: Self-pay | Admitting: *Deleted

## 2016-10-30 ENCOUNTER — Other Ambulatory Visit: Payer: Self-pay

## 2016-10-30 MED ORDER — TRAMADOL HCL 50 MG PO TABS: 50.0000 mg | ORAL_TABLET | Freq: Three times a day (TID) | ORAL | 0 refills | Status: DC | PRN

## 2016-10-30 MED ORDER — CLONAZEPAM 1 MG PO TABS: 1.0000 mg | ORAL_TABLET | Freq: Two times a day (BID) | ORAL | 0 refills | Status: DC | PRN

## 2016-10-30 NOTE — Telephone Encounter (Signed)
plz phone in. 

## 2016-10-30 NOTE — Telephone Encounter (Signed)
Pt left v/m requesting refill tramadol (last refilled # 120 on 01/03/16) and clonazepam (last printed # 60 on 09/25/16.) last seen acute for knee pain on 09/17/16 and last annual on 02/28/16. [facility] [address].

## 2016-10-31 ENCOUNTER — Other Ambulatory Visit: Payer: Self-pay | Admitting: Family Medicine

## 2016-10-31 NOTE — Telephone Encounter (Signed)
Rx's called in as directed.  

## 2016-11-26 ENCOUNTER — Other Ambulatory Visit: Payer: Self-pay | Admitting: Family Medicine

## 2016-11-26 NOTE — Telephone Encounter (Signed)
Last office visit 09/17/2016 with Dr. Lorelei Pont.  Last refilled 10/30/2016 for #60 with no refills.  Ok to refill?

## 2016-11-28 NOTE — Telephone Encounter (Signed)
plz phone in. 

## 2016-11-28 NOTE — Telephone Encounter (Signed)
Called in to [id number] - [place], [place] [address] [phone]  

## 2016-12-11 IMAGING — US US EXTREM LOW VENOUS*R*
1 series · 13 of 24 positions shown · non-contrast
Comparison: None.

CLINICAL DATA: Right lower extremity pain for 2 weeks. Recent
prolonged automobile trip

EXAM:
RIGHT LOWER EXTREMITY VENOUS DUPLEX ULTRASOUND
TECHNIQUE: Gray-scale sonography with graded compression, as well as color
Doppler and duplex ultrasound were performed to evaluate the right
lower extremity deep venous system from the level of the common
femoral vein and including the common femoral, femoral, profunda
femoral, popliteal and calf veins including the posterior tibial,
peroneal and gastrocnemius veins when visible. The superficial great
saphenous vein was also interrogated. Spectral Doppler was utilized
to evaluate flow at rest and with distal augmentation maneuvers in
the common femoral, femoral and popliteal veins.

[Series 1: us extrem low venous*right* · 0.08mm/px · 13 of 46 slices shown]
[im 1/46]
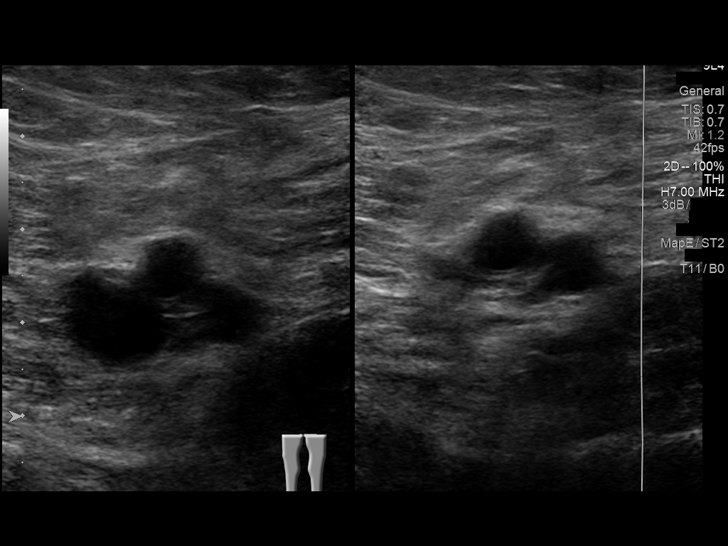
[im 4/46]
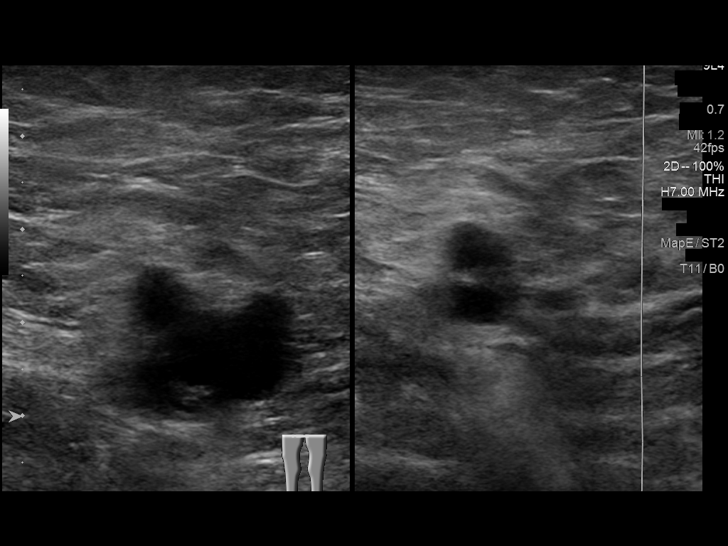
[im 8/46]
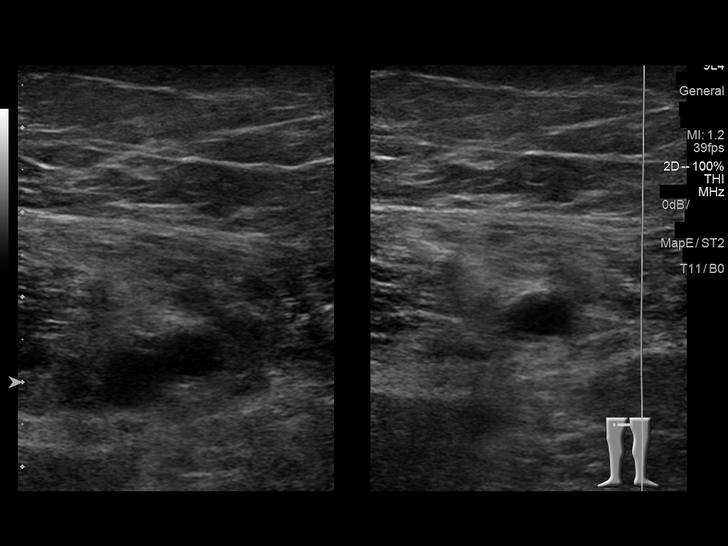
[im 12/46]
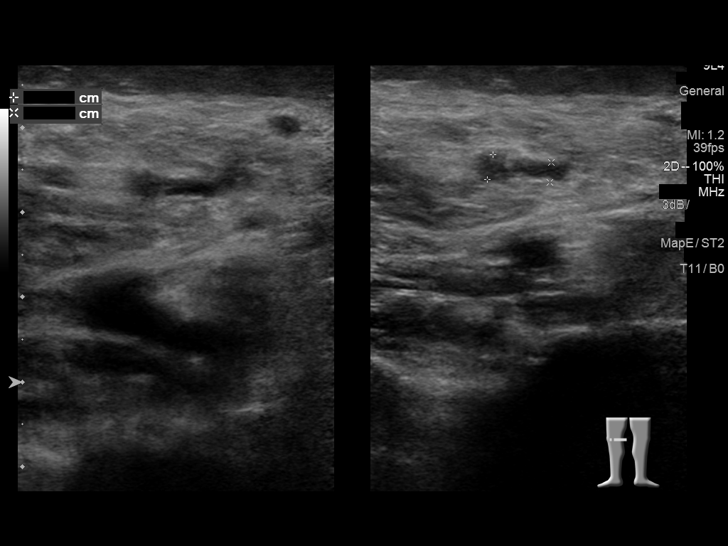
[im 16/46]
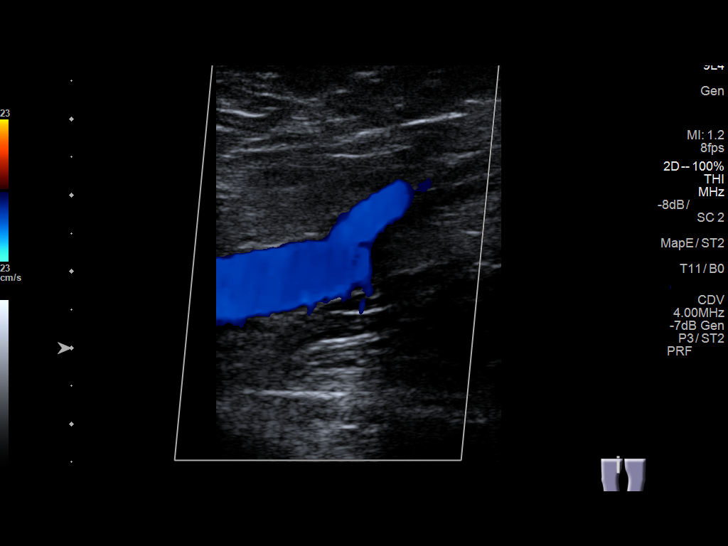
[im 20/46]
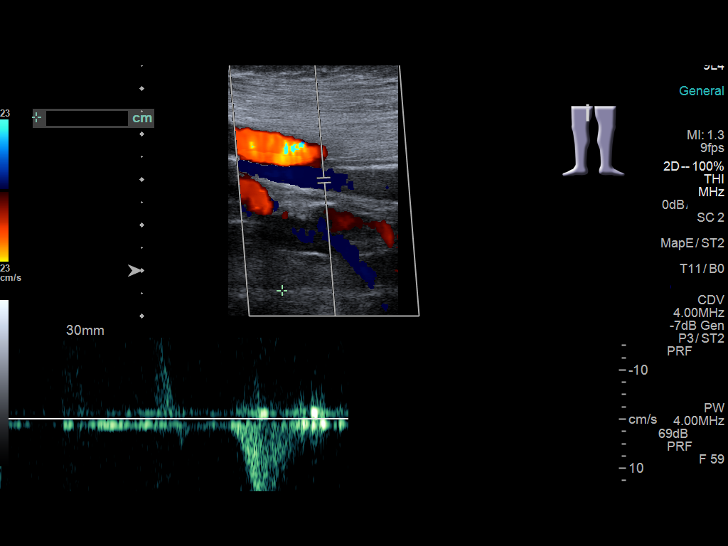
[im 24/46]
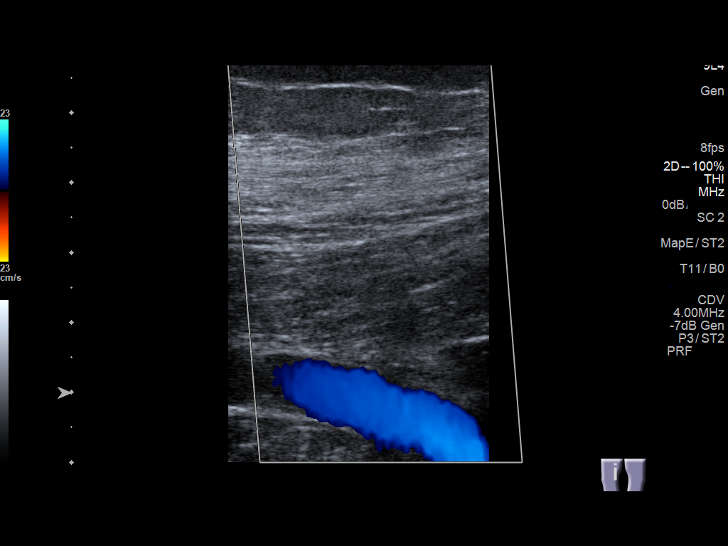
[im 26/46]
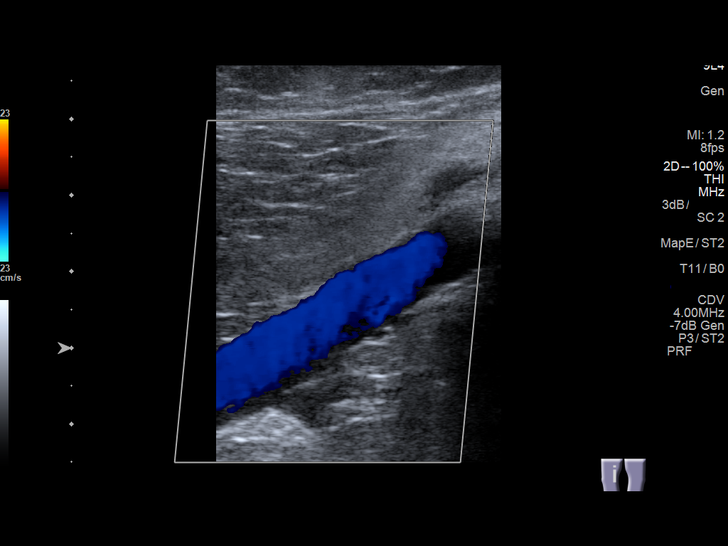
[im 30/46]
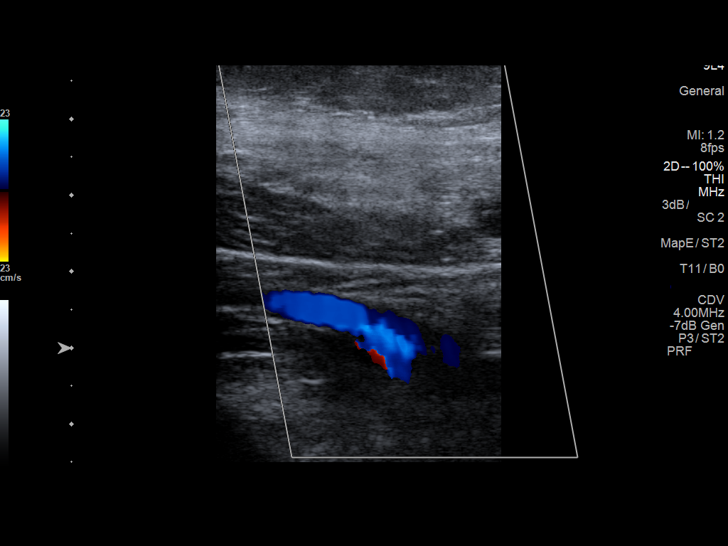
[im 34/46]
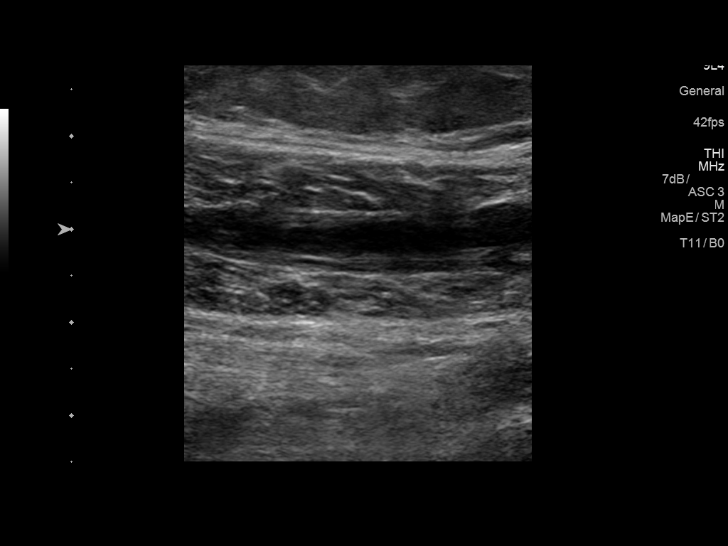
[im 38/46]
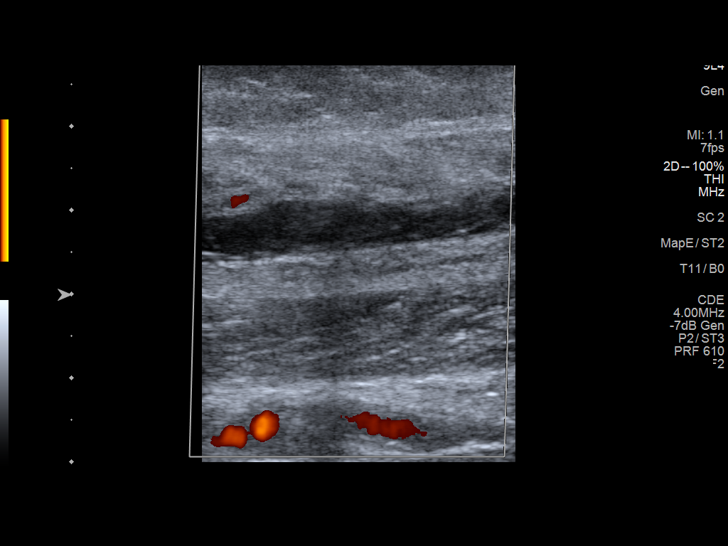
[im 42/46]
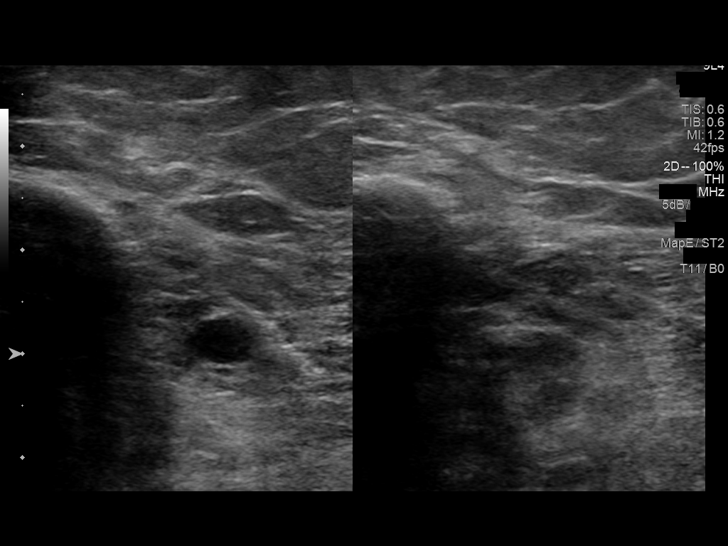
[im 46/46]
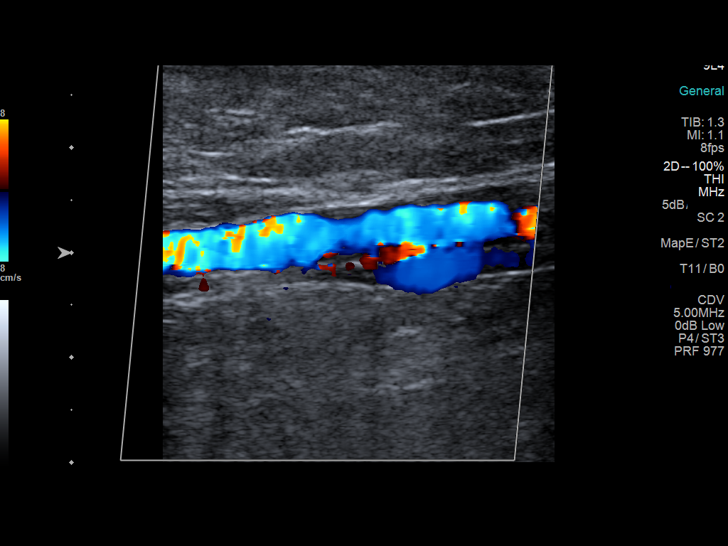

[13 of 24 positions shown; findings below may reference images not displayed]

FINDINGS: Contralateral Common Femoral Vein: Respiratory phasicity is normal
and symmetric with the symptomatic side. No evidence of thrombus.
Normal compressibility.

Common Femoral Vein: No evidence of thrombus. Normal
compressibility, respiratory phasicity and response to augmentation.

Saphenofemoral Junction: No evidence of thrombus. Normal
compressibility and flow on color Doppler imaging.

Profunda Femoral Vein: No evidence of thrombus. Normal
compressibility and flow on color Doppler imaging.

Femoral Vein: No evidence of thrombus. Normal compressibility,
respiratory phasicity and response to augmentation.

Popliteal Vein: No evidence of thrombus. Normal compressibility,
respiratory phasicity and response to augmentation.

Calf Veins: There is occlusive acute appearing thrombus throughout
the peroneal vein. The posterior tibial veins are duplicated. One of
the posterior tibial veins is widely patent. The other is filled
with acute thrombus.

Superficial Great Saphenous Vein: No evidence of thrombus. Normal
compressibility and flow on color Doppler imaging.

Venous Reflux:  None.

Other Findings: Acute thrombus is also seen in the gastrocnemius
veins and in the visualized portions of the lesser saphenous vein.
IMPRESSION: There is acute deep venous thrombosis in a posterior tibial vein and
in the peroneal vein. No deep venous thrombosis is seen at or above
the knee. There is superficial venous thrombosis throughout the
visualized lesser saphenous vein. There is also acute thrombus in
the gastrocnemius veins. No thrombus is appreciable in the greater
saphenous vein or at the saphenous femoral junction. Note that the
left common femoral vein is patent.

These results will be called to the ordering clinician or
representative by the Radiologist Assistant, and communication
documented in the PACS or zVision Dashboard.

## 2016-12-18 ENCOUNTER — Ambulatory Visit (INDEPENDENT_AMBULATORY_CARE_PROVIDER_SITE_OTHER): Payer: Medicare HMO | Admitting: Family Medicine

## 2016-12-18 ENCOUNTER — Encounter: Payer: Self-pay | Admitting: Family Medicine

## 2016-12-18 ENCOUNTER — Encounter (INDEPENDENT_AMBULATORY_CARE_PROVIDER_SITE_OTHER): Payer: Self-pay

## 2016-12-18 VITALS — BP 126/74 | HR 77 | Temp 98.0°F | Ht 61.5 in | Wt 166.5 lb

## 2016-12-18 DIAGNOSIS — M79605 Pain in left leg: Secondary | ICD-10-CM | POA: Diagnosis not present

## 2016-12-18 MED ORDER — DICLOFENAC SODIUM 75 MG PO TBEC: 75.0000 mg | DELAYED_RELEASE_TABLET | Freq: Two times a day (BID) | ORAL | 0 refills | Status: DC

## 2016-12-18 NOTE — Assessment & Plan Note (Signed)
Anticipate combination of L trochanteric bursitis and knee arthritis.  rec hip bursitis exercises provided today, short voltaren course, discussed PRN tylenol, tramadol, ibuprofen use as well.  Update if not improving with treatment.

## 2016-12-18 NOTE — Progress Notes (Signed)
BP 126/74   Pulse 77   Temp 98 F (36.7 C) (Oral)   Ht 5' 1.5" (1.562 m)   Wt 166 lb 8 oz (75.5 kg)   SpO2 96%   BMI 30.95 kg/m    CC: leg pain Subjective:    Patient ID: Ann White, female    DOB: [birth year], 71 y.o.   MRN: [id number]  HPI: Ann White is a 71 y.o. female presenting on 12/18/2016 for Leg Pain (pain radiates from knee to hip x 3-4 months) and Varicose Veins   Few week h/o L leg pain that starts in knee and radiates up into left hip. Worse with climbing stairs, worse as day progresses. Denies inciting trauma/injury. No back pain, no foot pain. No knee locking or unsteadiness.   Treating with tramadol, tylenol and knee brace.   Tender varicose veins.   Upcoming trip to [place].   Relevant past medical, surgical, family and social history reviewed and updated as indicated. Interim medical history since our last visit reviewed. Allergies and medications reviewed and updated. Outpatient Medications Prior to Visit  Medication Sig Dispense Refill  . Acetaminophen (ARTHRITIS PAIN RELIEF PO) Take by mouth.    . benzonatate (TESSALON) 200 MG capsule Take 1 capsule (200 mg total) by mouth 3 (three) times daily as needed for cough. 60 capsule 0  . Cholecalciferol (VITAMIN D3) 1000 UNITS CAPS Take 1 capsule (1,000 Units total) by mouth daily.    . clonazePAM (KLONOPIN) 1 MG tablet TAKE 1 TABLET BY MOUTH TWICE DAILY AS NEEDED 60 tablet 0  . Docusate Calcium (STOOL SOFTENER PO) Take by mouth daily.     Marland Kitchen gabapentin (NEURONTIN) 300 MG capsule TAKE 1 CAPSULE BY MOUTH EVERY MORNING AND 2 CAPSULES BY MOUTH EVERY NIGHT AT BEDTIME 270 capsule 2  . MAGNESIUM CITRATE PO Take by mouth.    . Misc Natural Products (SINUS FORMULA PO) Take by mouth.    . Multiple Vitamin (MULTIVITAMIN) tablet Take 1 tablet by mouth daily.    . Omega-3 Fatty Acids (FISH OIL) 1000 MG CAPS Take one by mouth two times a day    . pantoprazole (PROTONIX) 40 MG tablet Take 1 tablet (40 mg total) by mouth  daily. 90 tablet 3  . sertraline (ZOLOFT) 100 MG tablet Take 1 tablet (100 mg total) by mouth daily. 90 tablet 3  . traMADol (ULTRAM) 50 MG tablet Take 1 tablet (50 mg total) by mouth 3 (three) times daily as needed for moderate pain. 120 tablet 0  . vitamin B-12 (CYANOCOBALAMIN) 1000 MCG tablet Take 1,000 mcg by mouth daily.     No facility-administered medications prior to visit.      Per HPI unless specifically indicated in ROS section below Review of Systems     Objective:    BP 126/74   Pulse 77   Temp 98 F (36.7 C) (Oral)   Ht 5' 1.5" (1.562 m)   Wt 166 lb 8 oz (75.5 kg)   SpO2 96%   BMI 30.95 kg/m   Wt Readings from Last 3 Encounters:  12/18/16 166 lb 8 oz (75.5 kg)  09/17/16 166 lb 8 oz (75.5 kg)  07/17/16 168 lb (76.2 kg)    Physical Exam  Constitutional: She appears well-developed and well-nourished. No distress.  Musculoskeletal: She exhibits no edema.  Discomfort midline lumbar spine No paraspinous mm tenderness Neg SLR bilaterally. No pain with int/ext rotation at hip. Tender L>R GTB  R knee WNL L Knee exam:  No deformity on inspection. No pain with palpation of knee landmarks. No effusion/swelling noted. FROM in flex/extension without crepitus. Mild popliteal tenderness Neg drawer test. No pain with valgus/varus stress. No PFgrind. No abnormal patellar mobility.   Skin: Skin is warm and dry. No rash noted.  Varicose veins with reticular veins BLE  Nursing note and vitals reviewed.     Assessment & Plan:   Problem List Items Addressed This Visit    Left leg pain - Primary    Anticipate combination of L trochanteric bursitis and knee arthritis.  rec hip bursitis exercises provided today, short voltaren course, discussed PRN tylenol, tramadol, ibuprofen use as well.  Update if not improving with treatment.           Follow up plan: Return if symptoms worsen or fail to improve.  Ann Bush, MD

## 2016-12-18 NOTE — Patient Instructions (Signed)
I think you have hip bursitis as well as some arthritis of that left knee. Treat with short course of voltaren tablets twice daily with meals for 5 days then as needed. If GI upset, reflux the stop voltaren.  Do exercises provided today, may work on lateral leg raises as well.  Ice or heating pad to tender areas can also help.

## 2016-12-19 ENCOUNTER — Telehealth: Payer: Self-pay | Admitting: *Deleted

## 2016-12-19 MED ORDER — NAPROXEN 375 MG PO TABS: 375.0000 mg | ORAL_TABLET | Freq: Two times a day (BID) | ORAL | 0 refills | Status: DC

## 2016-12-19 NOTE — Telephone Encounter (Signed)
Received fax from [facility] stating Diclofenac 75 mg tablets are on manufacturer backorder.  Please advise.

## 2016-12-19 NOTE — Telephone Encounter (Signed)
[facility]  Advanced Heart Failure and Transplant  Heart Transplant Clinic  Follow-up Visit    Primary Care Physician: Brodsky, Mark E  Referring Provider: Brett Justin Berman  Date of Transplant: 09/04/2019  Organ(s) Transplanted: heart  Indication for transplant: Dilated Myopathy: Idiopathic  PHS increased risk donor: Yes    ID. 71 year old female with end-stage HFrEF 2/2 NICM s/p OHT 09/04/19, history of 2R, HTN, HLD and anxiety coming in for f/u of heart transplant.    Interval History:    The patient was last seen on 10/30/21. At that time issues with pain after urologic procedure.    He continues to deal with pain issue largely from prostate surgery. He still has some bleeding and some tissue come out. He tried different strategies and nothing helped. This is really impacting quality of life. He gets tired and frustrated and does not want to take it out on his family.    ROS:  A complete ROS was performed and is negative except as documented in the HPI.      Allergies:  Patient is allergic to cats [other] and dogs [other].    Past Medical History:   Diagnosis Date    Asthma     Atrial fibrillation (CMS-HCC)     Chronic HFrEF (heart failure with reduced ejection fraction) (CMS-HCC)     GERD (gastroesophageal reflux disease)     HTN (hypertension)     Insomnia     Nephrolithiasis     Sinusitis      Patient Active Problem List   Diagnosis    COPD (chronic obstructive pulmonary disease) (CMS-HCC)    Heart transplant, orthotopic, 09/04/2019    Pericardial effusion    Hypertension    Chronic back pain    At risk for infection transmitted from donor    Acute hepatitis C virus infection    Heart transplanted (CMS-HCC)    Acute UTI    Umbilical hernia without obstruction and without gangrene    COVID-19 virus detected    Acute medial meniscus tear of left knee, sequela    Localized osteoarthritis of left knee     Past Surgical History:   Procedure Laterality Date    CARDIAC DEFIBRILLATOR PLACEMENT       PB ANESTH,SHOULDER JOINT,NOS Right      Family History   Problem Relation Name Age of Onset    Hypertension Other      Other Maternal Grandmother          kidney disease needing HD     Social History     Socioeconomic History    Marital status: Single     Spouse name: Not on file    Number of children: Not on file    Years of education: Not on file    Highest education level: Not on file   Occupational History    Not on file   Tobacco Use    Smoking status: Never    Smokeless tobacco: Never    Tobacco comments:     from friends and relatives    Substance and Sexual Activity    Alcohol use: Not Currently     Comment: Prior heavier use, but completely quit in 2016    Drug use: Yes     Comment: eats edible marijuana for pain and insomnia     Sexual activity: Not on file   Other Topics Concern    Not on file   Social History Narrative      Born in [place], also lived in [place], [place], [place], no travel, worked as a carpenter, occasional cedar, no birds, no hot tubs, worked in construction + possible asbestos exposure      Social Determinants of Health     Financial Resource Strain: Not on file   Food Insecurity: Not on file   Transportation Needs: Not on file   Physical Activity: Not on file   Stress: Not on file   Social Connections: Not on file   Intimate Partner Violence: Not on file   Housing Stability: Not on file     Current Outpatient Medications   Medication Sig    albuterol 108 (90 Base) MCG/ACT inhaler Inhale 2 puffs by mouth every 4 hours as needed for Wheezing or Shortness of Breath.    aspirin 81 MG EC tablet Take 1 tablet (81 mg) by mouth daily.    baclofen (LIORESAL) 10 MG tablet Take 2 tablets (20 mg) by mouth nightly.    Blood Glucose Monitoring Suppl (TRUE METRIX METER) w/Device KIT Use as directed    budesonide-formoterol (SYMBICORT) 160-4.5 MCG/ACT inhaler Inhale 2 puffs by mouth every 12 hours.    bumetanide (BUMEX) 1 MG tablet Take 1 tablet (1 mg) by mouth daily as needed (fluid/weight  gain). Do not take unless instructed by Transplant team.    Calcium Carb-Cholecalciferol 600-10 MG-MCG TABS Take 1 tablet by mouth 2 times daily.    Cetirizine HCl (ZERVIATE) 0.24 % SOLN Place 1 drop into both eyes 2 times daily.    clindamycin (CLEOCIN T) 1 % solution Apply 1 Application. topically 2 times daily. Apply to the red bumps on your face up to two times a day.    controlled substance agreement controlled substance agreement    diclofenac (VOLTAREN) 1 % gel Apply 2 g topically 4 times daily.    docusate sodium (COLACE) 100 MG capsule Take 1 capsule (100 mg) by mouth 2 times daily.    DULoxetine (CYMBALTA) 30 MG CR capsule Take 1 capsule (30 mg) by mouth daily.    famotidine (PEPCID) 20 MG tablet Take 1 tablet (20 mg) by mouth 2 times daily.    fluticasone propionate (FLONASE) 50 MCG/ACT nasal spray Spray 1 spray into each nostril 2 times daily.    gabapentin (NEURONTIN) 300 MG capsule Take 1 capsule (300 mg) by mouth every morning AND 1 capsule (300 mg) daily AND 2 capsules (600 mg) every evening.    hydroCHLOROthiazide (HYDRODIURIL) 25 MG tablet Take 1 tablet (25 mg) by mouth daily.    ketoconazole (NIZORAL) 2 % shampoo Use shampoo daily for dandruff    lidocaine (LIDOCAINE PAIN RELIEF) 4 % patch Apply 1 patch topically every 24 hours. Leave patch on for 12 hours, then remove for 12 hours.    lisinopril (PRINIVIL, ZESTRIL) 10 MG tablet Take 2 tablets (20 mg) by mouth daily.    magnesium oxide (MAG-OX) 400 MG tablet Take 1 tablet by mouth daily    melatonin (GNP MELATONIN MAXIMUM STRENGTH) 5 MG tablet Take 2 tablets (10 mg) by mouth at bedtime.    Multiple Vitamin (MULTIVITAMIN) TABS tablet Take 1 tablet by mouth daily.    naloxone (KLOXXADO) 8 mg/0.1 mL nasal spray Call 911! Tilt head and spray intranasally into one nostril as needed for respiratory depression. If patient does not respond or responds and then relapses, repeat using a new nasal spray every 3 minutes until emergency medical assistance  arrives.    NEEDLE, DISP, 25 G 25G X   1" MISC Use to inject testosterone    NIFEdipine (ADALAT CC) 30 MG Controlled-Release tablet Take 1 tablet (30 mg) by mouth nightly.    ondansetron (ZOFRAN) 8 MG tablet Take 1 tablet (8 mg) by mouth every 8 hours as needed for Nausea/Vomiting.    oxyCODONE (ROXICODONE) 10 MG tablet Take 1 tab every 4 hours as needed for moderate pain and 2 tabs every 4 hours as needed for severe pain. Max 10 tabs per day, 28 day supply    phenazopyridine (PYRIDIUM) 100 MG tablet Take 1 tablet (100 mg) by mouth 3 times daily.    polyethylene glycol (GLYCOLAX) 17 GM/SCOOP powder Mix 17 grams in 4-8 oz of liquide and drink by mouth daily as needed (Constipation).    pravastatin (PRAVACHOL) 40 MG tablet Take 1 tablet (40 mg) by mouth every evening.    senna (SENOKOT) 8.6 MG tablet Take 1 tablet (8.6 mg) by mouth daily.    sirolimus (RAPAMUNE) 1 MG tablet Take 2 tablets (2 mg) by mouth every morning.    SYRINGE-NEEDLE, DISP, 3 ML (B-D 3CC LUER-LOK SYR 25GX1") 25G X 1" 3 ML MISC Use as directed to inject testosterone    SYRINGE-NEEDLE, DISP, 3 ML 18G X 1-1/2" 3 ML MISC Use to draw up testosterone    tacrolimus (ENVARSUS XR) 1 MG tablet STOP TAKING since 03/28/22 - remaining on chart for dose adjustments, titratable med.    tacrolimus (ENVARSUS XR) 4 MG tablet Take 1 tablet (4 mg) by mouth every morning.    tamsulosin (FLOMAX) 0.4 MG capsule Take 1 capsule (0.4 mg) by mouth daily.    tamsulosin (FLOMAX) 0.4 MG capsule Take 1 capsule (0.4 mg) by mouth daily.    testosterone cypionate (DEPO-TESTOSTERONE) 200 MG/ML SOLN Inject 1 ml into the muscle every 14 days    traZODone (DESYREL) 50 MG tablet Take 1 tablet (50 mg) by mouth nightly.     Current Facility-Administered Medications   Medication    diphenhydrAMINE (BENADRYL) injection 50 mg    diphenhydrAMINE (BENADRYL) tablet 50 mg     Immunization History   Administered Date(s) Administered    COVID-19 (Moderna) Low Dose Red Cap >= 18 Years 08/26/2020     COVID-19 (Moderna) Red Cap >= 12 Years 10/03/2019, 11/02/2019, 03/02/2020    Hep-A/Hep-B; Twinrix, Adult 09/26/2020    Influenza Vaccine (High Dose) Quadrivalent >=65 Years 05/18/2020    Influenza Vaccine (Unspecified) 03/16/2017    Influenza Vaccine >=6 Months 05/29/2010, 05/29/2011, 07/24/2012, 04/21/2014, 04/04/2018, 04/20/2019    Pneumococcal 13 Vaccine (PREVNAR-13) 05/18/2020    Pneumococcal 23 Vaccine (PNEUMOVAX-23) 05/29/2013, 09/26/2020    Tdap 07/17/2011   Deferred Date(s) Deferred    Pneumococcal 23 Vaccine (PNEUMOVAX-23) 09/17/2019     Physical Exam:  BP 102/69 (BP Location: Right arm, BP Patient Position: Sitting, BP cuff size: Large)   Pulse 98   Temp 98.5 F (36.9 C) (Temporal)   Resp 16   Ht 5' 10" (1.778 m)   Wt 96.2 kg (212 lb)   SpO2 97%   BMI 30.42 kg/m      General Appearance: ***alert, no distress, pleasant affect, cooperative.  Heart:  JVD ***, PMI ***, normal rate and regular rhythm, no murmurs, clicks, or gallops. ***  Lungs: ***clear to auscultation and percussion. No rales, rhonchi, or wheezes noted. No chest deformities noted.  Abdomen: ***BS normal.  Abdomen soft, non-tender.  No masses or organomegaly.  Extremities:  ***no cyanosis, clubbing, or edema. Has 2+ peripheral pulses.        Lab Data:  Lab Results   Component Value Date    BUN 26 (H) 03/28/2022    CREAT 1.98 (H) 03/28/2022    CL 99 03/28/2022    NA 140 03/28/2022    K 4.4 03/28/2022    CA 9.2 03/28/2022    TBILI 0.47 03/28/2022    ALB 4.1 03/28/2022    TP 7.1 03/28/2022    AST 22 03/28/2022    ALK 76 03/28/2022    BICARB 29 03/28/2022    ALT 25 03/28/2022    GLU 126 (H) 03/28/2022     Lab Results   Component Value Date    WBC 7.9 03/28/2022    RBC 5.50 03/28/2022    HGB 15.2 03/28/2022    HCT 46.5 03/28/2022    MCV 84.5 03/28/2022    MCHC 32.7 03/28/2022    RDW 12.3 03/28/2022    PLT 162 03/28/2022    MPV 11.6 03/28/2022     Lab Results   Component Value Date    A1C 5.7 08/25/2021     Lab Results   Component Value Date     TSH 1.63 08/25/2021     Lab Results   Component Value Date    CHOL 105 08/25/2021    HDL 38 08/25/2021    LDLCALC 43 08/25/2021    TRIG 121 08/25/2021     Lab Results   Component Value Date    SIROT 11.5 03/28/2022     Lab Results   Component Value Date    FKTR 6.5 03/28/2022     No results found for: CSATR  Lab Results   Component Value Date    CMVPL Not Detected 06/16/2021     Lab Results   Component Value Date    DSA ABSENT 03/28/2022       Prior Cardiovascular Studies:   Lab Results   Component Value Date    LV Ejection Fraction 59 09/21/2021          Echo 09/21/21  Summary:   1. The left ventricular size is normal. The left ventricular systolic function is normal.   2. No left ventricular hypertrophy.   3. Normal pattern of left ventricular diastolic filling.   4. EF=59%.   5. Compared to prior study EF now 59%, was 69% 10/14/20.     LHC/IVUS 09/19/21  CONCLUSION:                                                                   1. Myocardial bridging with mild systolic compression of the mid segment    of the left anterior descending coronary artery.                              2. No angiographic evidence of coronary artery disease.                      3. Intimal thickness noted in LAD/LM up to 0.5 mm (Stable to slightly       worse compare to 2022).                                                         4. Non significant FFR at apical LAD.                                        5. Left ventricular end diastolic pressure appears normal.        Assessment summary:  71 year old female with end-stage HFrEF 2/2 NICM s/p OHT 09/04/19, history of 2R, HTN, HLD and anxiety coming in for f/u of heart transplant.    Assessment/Plan:  # Hematuria  # Dysuria  # Chronic pain  Assessment: We had a long frank discussion about patient's chronic pain issues and the heart transplant team's role in this. I discussed with him that when I initially agreed to cover his chronic opiate prescription, this was the assumption that he would  have a provider versed in chronic pain after 3-4 months, but we are at 6 months and has unable to find one. Additionally, I had not put him on a pain contract at that time, but he recently used more opiates without asking and I informed him this was not appropriate, but because he had not established guidelines I was not going to stop at this time. However, going forward until he can establish with a pain physician, we will set up a pain contract and he will need to follow through like a usual pain clinic with us with goal of provider in 3-4 months or I may start tapering. I will augment adjuvant agents additionally for now and we can continue to work on this.  Plan:  -pain contract signed  -urine tox monthly  -clinic follow up month  -oxycodone 10 mg tablets PO, 1 tab every 4 hours moderate pain, 2 tabs every 4 hours for severe pain, no more than 10 tablets a day, total 280 per 28 days.   -diclofenac cream for joint pain  -lidocaine patch for back pain  -trial of pyridium  -increase gaba at night  -siro change as below  -cymbalta as below    # End-stage heart failure s/p orthotopic heart transplant  # Chronic Immunosuppression/Immunomodulation  Assessment: While we thought continuing sirolimus would help prevent recurrent scar tissue from prostate procedure, it may be exacerbating factors now with delayed wound healing. Will try mmf for 1 month.  Plan:   - continue envarsus 6 mg daily, goal trough 4-8  - HOLD sirolimus 3 mg daily, goal trough 4-8 for at least 1 month  - start mmf 1000 mg bid for one month to allow healing  - Continue to monitor for renal toxicities, infection risk and malignancy risk  - continue pravastatin 40 mg daily  - continue aspirin 81 mg daily    # Hypertension  Assessment: controlled  Plan:  -continue lisinopril 20 mg daily  -resume hctz  -nifedipine 30 mg daily    # Dyslipidemia  -continue pravastatin 40 mg daily    # Depression  Assessment: improved mood  Plan:  -increase cymbalta to 120  mg daily     RTC in 1 month       Nicholas W Wettersten, MD  Advanced Heart Failure, Mechanical Circulatory Support, Transplant  Pgr: [phone]

## 2016-12-24 ENCOUNTER — Other Ambulatory Visit: Payer: Self-pay | Admitting: Family Medicine

## 2016-12-24 NOTE — Telephone Encounter (Signed)
Last filled 11/28/16 #60 BID PRN 0 refills

## 2016-12-25 ENCOUNTER — Other Ambulatory Visit: Payer: Self-pay | Admitting: Family Medicine

## 2016-12-25 NOTE — Telephone Encounter (Signed)
Electronic refill request. Last office visit:   12/18/16 Last Filled:    60 tablet 0 12/25/2016  Please advise.

## 2016-12-25 NOTE — Telephone Encounter (Signed)
Rx called in to requested pharmacy 

## 2016-12-25 NOTE — Telephone Encounter (Signed)
plz phone in. 

## 2017-01-01 ENCOUNTER — Telehealth: Payer: Self-pay

## 2017-01-01 NOTE — Telephone Encounter (Signed)
Left pt message asking to call Ebony Hail back directly at [phone] to schedule AWV + labs with Katha Cabal and CPE with PCP.  *NOTE*Schedule after 02/20/17

## 2017-01-01 NOTE — Telephone Encounter (Signed)
Patient is on the list for [facility] and may be a good candidate for an AWV. Please let me know if/when appt is scheduled.   

## 2017-01-14 NOTE — Telephone Encounter (Signed)
Scheduled 02/26/17

## 2017-01-18 IMAGING — MG MM SCREENING BREAST TOMO BILATERAL
8 of 12 series · 8 of 28 positions shown · non-contrast
Comparison: Previous exam(s).

CLINICAL DATA: Screening.

EXAM:
2D DIGITAL SCREENING BILATERAL MAMMOGRAM WITH CAD AND ADJUNCT TOMO

[R CC]
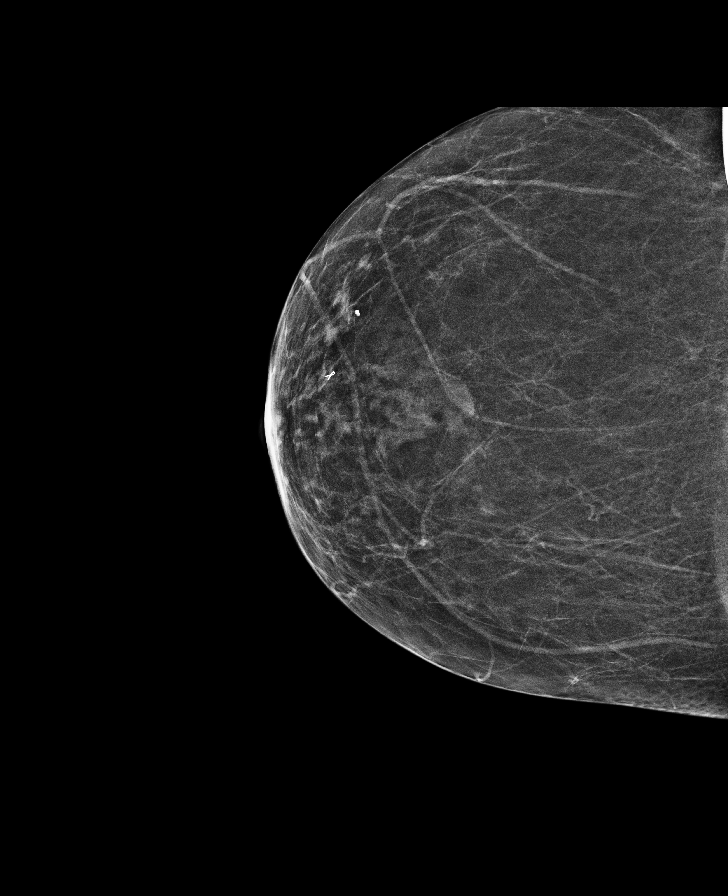

[L MLO]
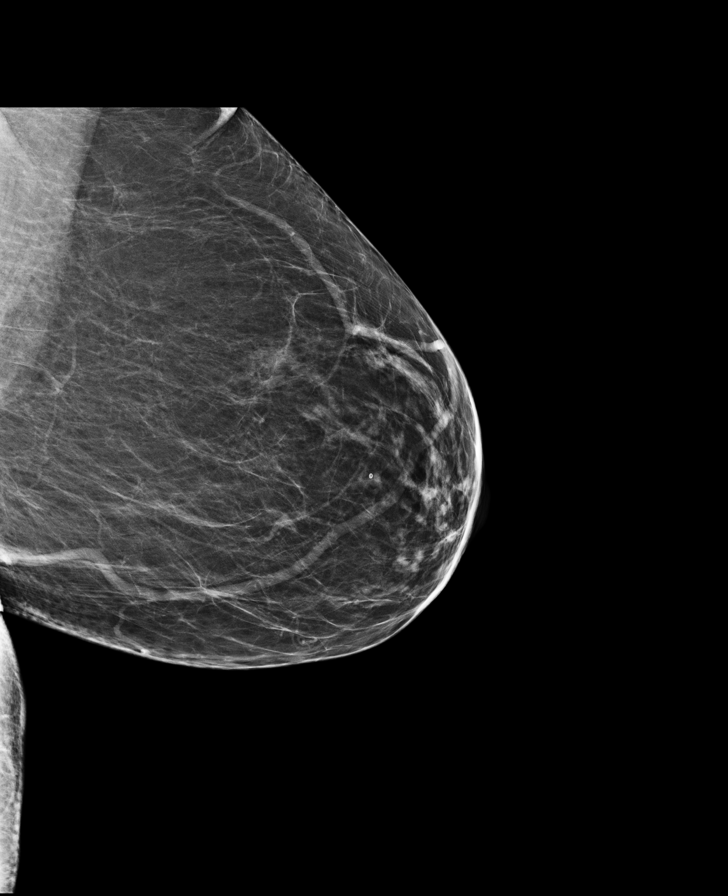

[L CC synth-2D]
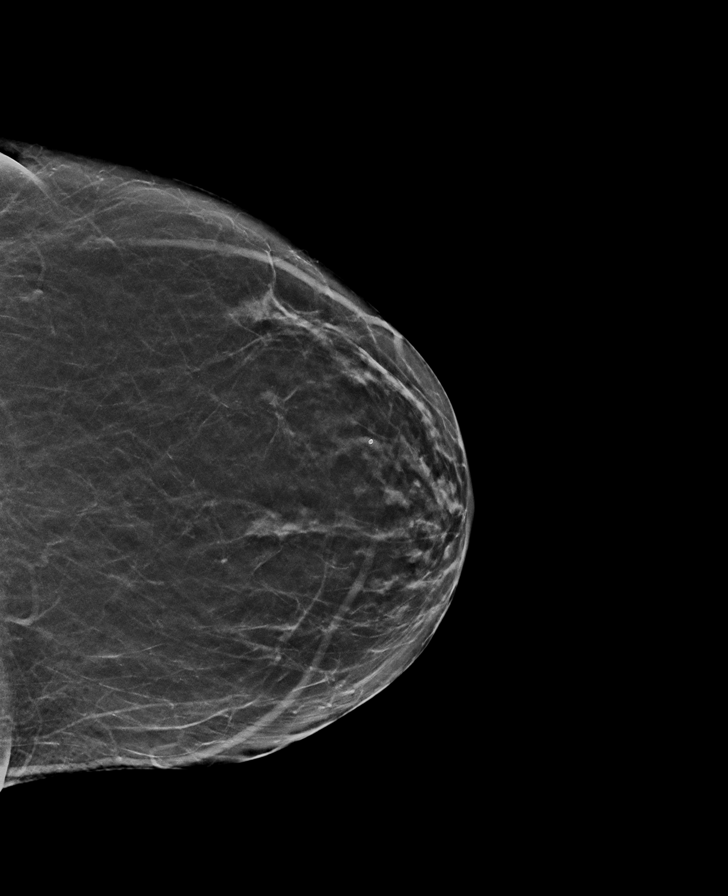

[R MLO]
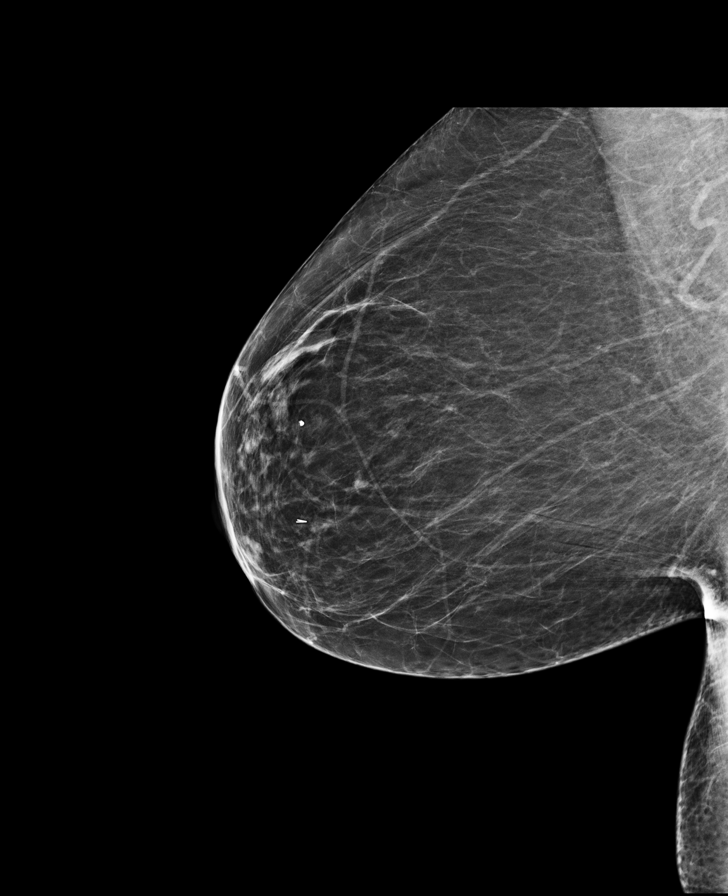

[L MLO synth-2D]
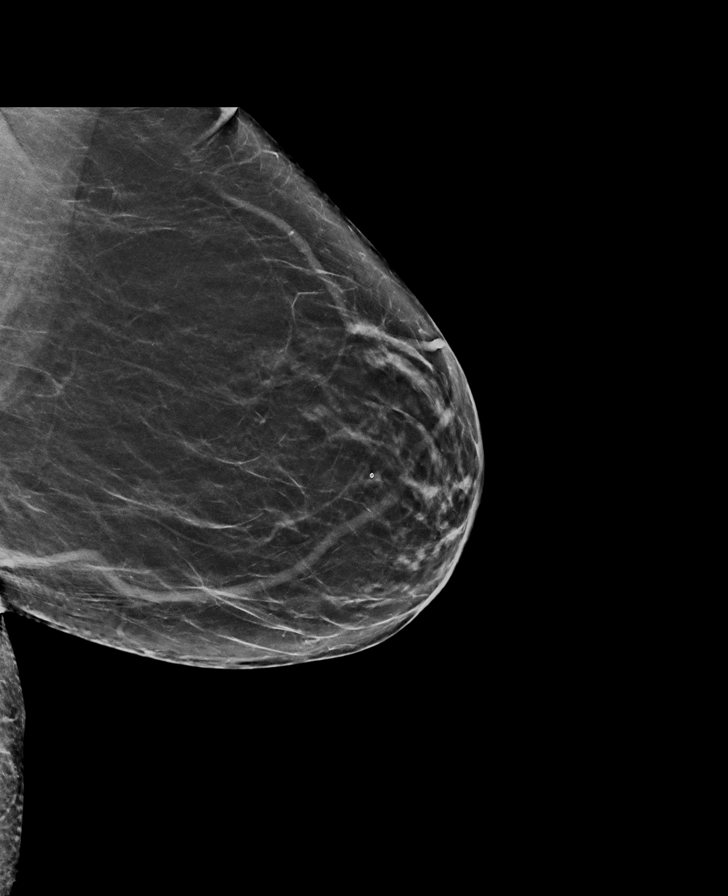

[L CC]
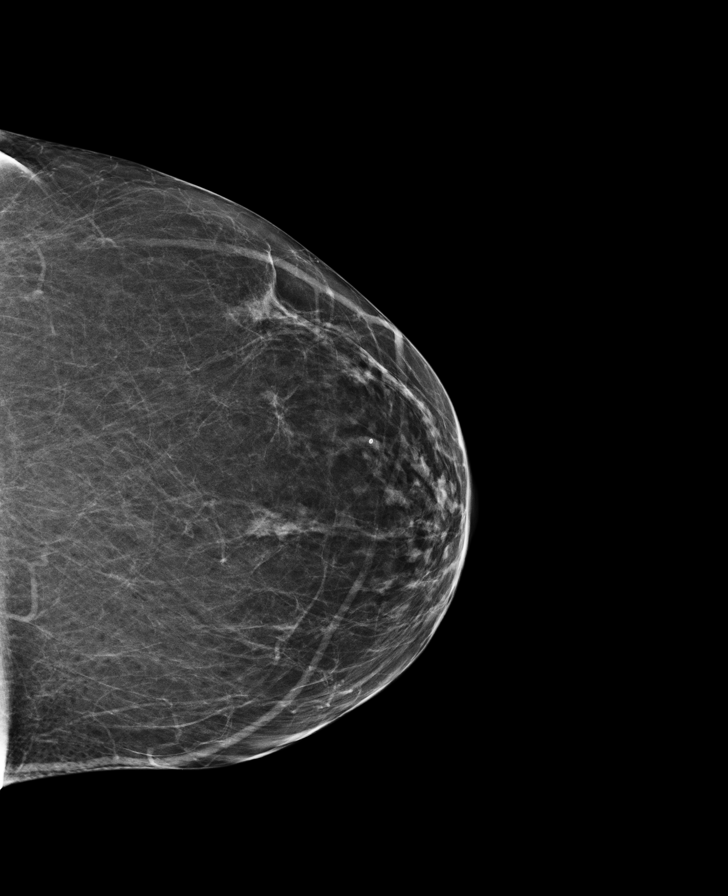

[R CC synth-2D]
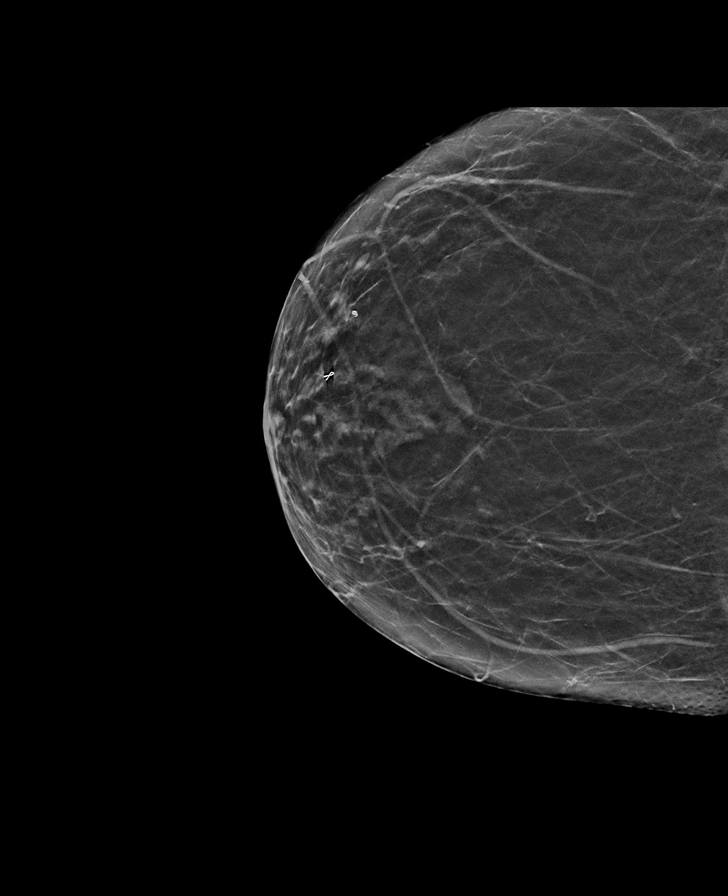

[R MLO synth-2D]
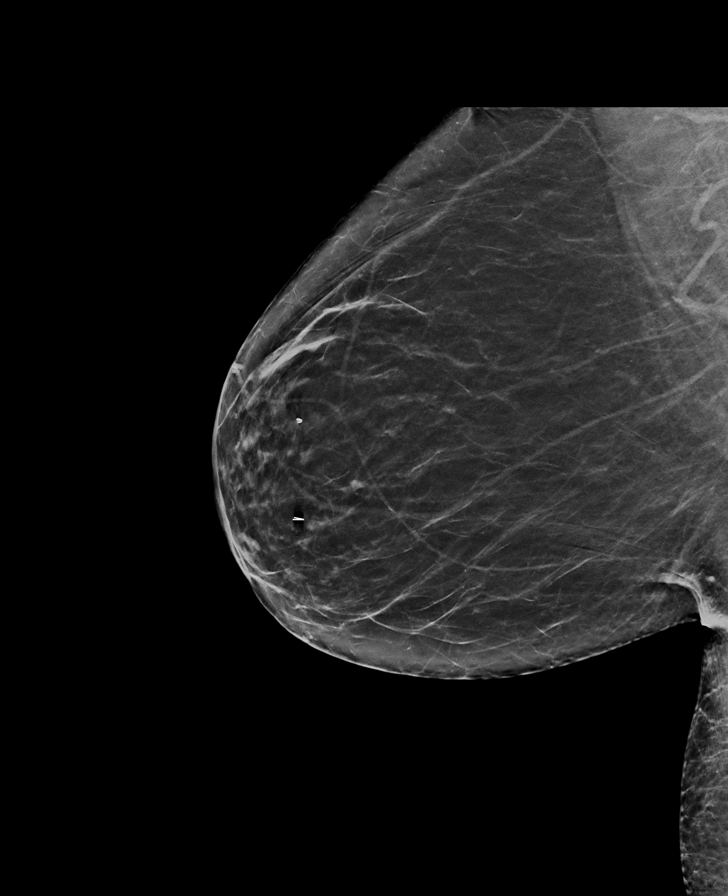

[8 of 28 positions shown; findings below may reference images not displayed]

ACR Breast Density Category b: There are scattered areas of
fibroglandular density.
FINDINGS: There are no findings suspicious for malignancy. Images were
processed with CAD.
IMPRESSION: No mammographic evidence of malignancy. A result letter of this
screening mammogram will be mailed directly to the patient.

RECOMMENDATION:
Screening mammogram in one year. (Code:97-6-RS4)

BI-RADS CATEGORY  1: Negative.

## 2017-01-28 ENCOUNTER — Other Ambulatory Visit: Payer: Self-pay | Admitting: Family Medicine

## 2017-01-28 NOTE — Telephone Encounter (Signed)
plz phone in. 

## 2017-01-28 NOTE — Telephone Encounter (Signed)
Rx called to pharmacy as instructed. 

## 2017-01-28 NOTE — Telephone Encounter (Signed)
Last filled 12-25-16 #30 Last OV Acute 09-17-16 Next OV CPE 03-05-17

## 2017-02-25 ENCOUNTER — Other Ambulatory Visit: Payer: Self-pay | Admitting: Family Medicine

## 2017-02-25 NOTE — Progress Notes (Signed)
Pre visit review using our clinic review tool, if applicable. No additional management support is needed unless otherwise documented below in the visit note. 

## 2017-02-25 NOTE — Telephone Encounter (Signed)
Rx called in to requested pharmacy 

## 2017-02-25 NOTE — Telephone Encounter (Signed)
Last Rx 01/28/2017. Last OV 12/2016-acute.

## 2017-02-25 NOTE — Telephone Encounter (Signed)
plz phoen in. 

## 2017-02-26 ENCOUNTER — Ambulatory Visit (INDEPENDENT_AMBULATORY_CARE_PROVIDER_SITE_OTHER): Payer: Medicare HMO

## 2017-02-26 VITALS — BP 130/76 | HR 59 | Temp 97.7°F | Ht 61.5 in | Wt 168.2 lb

## 2017-02-26 DIAGNOSIS — E559 Vitamin D deficiency, unspecified: Secondary | ICD-10-CM

## 2017-02-26 DIAGNOSIS — I10 Essential (primary) hypertension: Secondary | ICD-10-CM

## 2017-02-26 DIAGNOSIS — E784 Other hyperlipidemia: Secondary | ICD-10-CM | POA: Diagnosis not present

## 2017-02-26 DIAGNOSIS — Z0000 Encounter for general adult medical examination without abnormal findings: Secondary | ICD-10-CM | POA: Diagnosis not present

## 2017-02-26 DIAGNOSIS — E7849 Other hyperlipidemia: Secondary | ICD-10-CM

## 2017-02-26 LAB — CBC WITH DIFFERENTIAL/PLATELET
Basophils Absolute: 0 10*3/uL (ref 0.0–0.1)
Basophils Relative: 0.6 % (ref 0.0–3.0)
Eosinophils Absolute: 0.2 10*3/uL (ref 0.0–0.7)
Eosinophils Relative: 4.2 % (ref 0.0–5.0)
HCT: 33.5 % — ABNORMAL LOW (ref 36.0–46.0)
Hemoglobin: 11.3 g/dL — ABNORMAL LOW (ref 12.0–15.0)
Lymphocytes Relative: 32.8 % (ref 12.0–46.0)
Lymphs Abs: 1.6 10*3/uL (ref 0.7–4.0)
MCHC: 33.6 g/dL (ref 30.0–36.0)
MCV: 90.4 fl (ref 78.0–100.0)
Monocytes Absolute: 0.2 10*3/uL (ref 0.1–1.0)
Monocytes Relative: 4.4 % (ref 3.0–12.0)
Neutro Abs: 2.9 10*3/uL (ref 1.4–7.7)
Neutrophils Relative %: 58 % (ref 43.0–77.0)
Platelets: 175 10*3/uL (ref 150.0–400.0)
RBC: 3.71 Mil/uL — ABNORMAL LOW (ref 3.87–5.11)
RDW: 14.1 % (ref 11.5–15.5)
WBC: 5 10*3/uL (ref 4.0–10.5)

## 2017-02-26 LAB — RENAL FUNCTION PANEL
Albumin: 4.8 g/dL (ref 3.5–5.2)
BUN: 24 mg/dL — ABNORMAL HIGH (ref 6–23)
CO2: 30 mEq/L (ref 19–32)
Calcium: 9.4 mg/dL (ref 8.4–10.5)
Chloride: 102 mEq/L (ref 96–112)
Creatinine, Ser: 1.03 mg/dL (ref 0.40–1.20)
GFR: 56.33 mL/min — ABNORMAL LOW (ref >60.00)
Glucose, Bld: 88 mg/dL (ref 70–99)
Phosphorus: 4.7 mg/dL — ABNORMAL HIGH (ref 2.3–4.6)
Potassium: 4.3 mEq/L (ref 3.5–5.1)
Sodium: 137 mEq/L (ref 135–145)

## 2017-02-26 LAB — VITAMIN D 25 HYDROXY (VIT D DEFICIENCY, FRACTURES): VITD: 40.4 ng/mL (ref 30.00–100.00)

## 2017-02-26 LAB — LIPID PANEL
Cholesterol: 255 mg/dL — ABNORMAL HIGH (ref 0–200)
HDL: 47.2 mg/dL (ref >39.00)
NonHDL: 207.77
Total CHOL/HDL Ratio: 5
Triglycerides: 217 mg/dL — ABNORMAL HIGH (ref 0.0–149.0)
VLDL: 43.4 mg/dL — ABNORMAL HIGH (ref 0.0–40.0)

## 2017-02-26 LAB — LDL CHOLESTEROL, DIRECT: Direct LDL: 171 mg/dL

## 2017-02-26 NOTE — Patient Instructions (Signed)
Ann White , Thank you for taking time to come for your Medicare Wellness Visit. I appreciate your ongoing commitment to your health goals. Please review the following plan we discussed and let me know if I can assist you in the future.   These are the goals we discussed: Goals    . Increase physical activity          Starting 02/26/2017, I will continue to exercise for at least 60 min three weekly as schedule permits.        This is a list of the screening recommended for you and due dates:  Health Maintenance  Topic Date Due  . Flu Shot  10/13/2017*  . Mammogram  10/22/2017  . Colon Cancer Screening  07/02/2018  . DTaP/Tdap/Td vaccine (2 - Td) 08/27/2022  . Tetanus Vaccine  08/27/2022  . DEXA scan (bone density measurement)  Completed  .  Hepatitis C: One time screening is recommended by Center for Disease Control  (CDC) for  adults born from [birth year] through 1964.   Completed  . Pneumonia vaccines  Completed  *Topic was postponed. The date shown is not the original due date.   Preventive Care for Adults  A healthy lifestyle and preventive care can promote health and wellness. Preventive health guidelines for adults include the following key practices.  . A routine yearly physical is a good way to check with your health care provider about your health and preventive screening. It is a chance to share any concerns and updates on your health and to receive a thorough exam.  . Visit your dentist for a routine exam and preventive care every 6 months. Brush your teeth twice a day and floss once a day. Good oral hygiene prevents tooth decay and gum disease.  . The frequency of eye exams is based on your age, health, family medical history, use  of contact lenses, and other factors. Follow your health care provider's ecommendations for frequency of eye exams.  . Eat a healthy diet. Foods like vegetables, fruits, whole grains, low-fat dairy products, and lean protein foods contain the  nutrients you need without too many calories. Decrease your intake of foods high in solid fats, added sugars, and salt. Eat the right amount of calories for you. Get information about a proper diet from your health care provider, if necessary.  . Regular physical exercise is one of the most important things you can do for your health. Most adults should get at least 150 minutes of moderate-intensity exercise (any activity that increases your heart rate and causes you to sweat) each week. In addition, most adults need muscle-strengthening exercises on 2 or more days a week.  Silver Sneakers may be a benefit available to you. To determine eligibility, you may visit the website: www.silversneakers.com or contact program at [phone] Mon-Fri between 8AM-8PM.   . Maintain a healthy weight. The body mass index (BMI) is a screening tool to identify possible weight problems. It provides an estimate of body fat based on height and weight. Your health care provider can find your BMI and can help you achieve or maintain a healthy weight.   For adults 20 years and older: ? A BMI below 18.5 is considered underweight. ? A BMI of 18.5 to 24.9 is normal. ? A BMI of 25 to 29.9 is considered overweight. ? A BMI of 30 and above is considered obese.   . Maintain normal blood lipids and cholesterol levels by exercising and minimizing your intake of  saturated fat. Eat a balanced diet with plenty of fruit and vegetables. Blood tests for lipids and cholesterol should begin at age 34 and be repeated every 5 years. If your lipid or cholesterol levels are high, you are over 50, or you are at high risk for heart disease, you may need your cholesterol levels checked more frequently. Ongoing high lipid and cholesterol levels should be treated with medicines if diet and exercise are not working.  . If you smoke, find out from your health care provider how to quit. If you do not use tobacco, please do not start.  . If you  choose to drink alcohol, please do not consume more than 2 drinks per day. One drink is considered to be 12 ounces (355 mL) of beer, 5 ounces (148 mL) of wine, or 1.5 ounces (44 mL) of liquor.  . If you are 88-57 years old, ask your health care provider if you should take aspirin to prevent strokes.  . Use sunscreen. Apply sunscreen liberally and repeatedly throughout the day. You should seek shade when your shadow is shorter than you. Protect yourself by wearing long sleeves, pants, a wide-brimmed hat, and sunglasses year round, whenever you are outdoors.  . Once a month, do a whole body skin exam, using a mirror to look at the skin on your back. Tell your health care provider of new moles, moles that have irregular borders, moles that are larger than a pencil eraser, or moles that have changed in shape or color.

## 2017-02-27 NOTE — Progress Notes (Signed)
Subjective:   LORAN AUGUSTE is a 71 y.o. female who presents for Medicare Annual (Subsequent) preventive examination.  Review of Systems:  N/A Cardiac Risk Factors include: advanced age (>85men, >25 women);dyslipidemia;hypertension;obesity (BMI >30kg/m2)     Objective:     Vitals: BP 130/76 (BP Location: Right Arm, Patient Position: Sitting, Cuff Size: Normal)   Pulse (!) 59   Temp 97.7 F (36.5 C) (Oral)   Ht 5' 1.5" (1.562 m) Comment: no shoes  Wt 168 lb 4 oz (76.3 kg)   SpO2 97%   BMI 31.28 kg/m   Body mass index is 31.28 kg/m.   Tobacco History  Smoking Status  . Never Smoker  Smokeless Tobacco  . Never Used    Comment: minimal smoking history     Counseling given: No   Past Medical History:  Diagnosis Date  . Arthritis   . Carotid stenosis 03/14/2016   Bilateral 40-59%, rpt 1 yr (02/2016)  . Cervical spondylosis    s/p spine injections  . Depression   . Ectatic abdominal aorta ([facility]) 2016   rpt [place] 5 yrs  . GERD (gastroesophageal reflux disease)   . History of chicken pox   . History of diverticulitis of colon   . HLD (hyperlipidemia)   . HTN (hypertension)   . Irritable bowel syndrome with constipation   . Lichen sclerosus et atrophicus   . Obesity   . Seasonal allergic rhinitis    Past Surgical History:  Procedure Laterality Date  . bladder tack  1990s  . BREAST BIOPSY Right 03/05/2012    benign  . CARPAL TUNNEL RELEASE  2004, 2013   bilateral  . CHOLECYSTECTOMY  1990s  . COLONOSCOPY  2002  . COLONOSCOPY  06/2013   mod diverticulosis, 3 tubular adenomas, int hem rpt 5 yrs [facility])  . DEXA  07/2011   normal, T score -0.3  . ESOPHAGOGASTRODUODENOSCOPY ENDOSCOPY  2004   normal [facility])  . TOTAL ABDOMINAL HYSTERECTOMY W/ BILATERAL SALPINGOOPHORECTOMY  1990s   complete, dysmenorrhea and fibroids   Family History  Problem Relation Age of Onset  . CAD Mother 72       MI  . Hypertension Mother   . Parkinson's disease Father   . Diabetes Father     . CAD Father 86       MI  . Cancer Neg Hx   . Stroke Neg Hx   . Colon cancer Neg Hx    History  Sexual Activity  . Sexual activity: No    Outpatient Encounter Prescriptions as of 02/26/2017  Medication Sig  . Acetaminophen (ARTHRITIS PAIN RELIEF PO) Take by mouth.  . Cholecalciferol (VITAMIN D3) 1000 UNITS CAPS Take 1 capsule (1,000 Units total) by mouth daily.  . clonazePAM (KLONOPIN) 1 MG tablet TAKE 1 TABLET BY MOUTH TWICE DAILY AS NEEDED  . Docusate Calcium (STOOL SOFTENER PO) Take by mouth daily.   Marland Kitchen gabapentin (NEURONTIN) 300 MG capsule TAKE 1 CAPSULE BY MOUTH EVERY MORNING AND 2 CAPSULES BY MOUTH EVERY NIGHT AT BEDTIME  . MAGNESIUM CITRATE PO Take by mouth.  . Misc Natural Products (SINUS FORMULA PO) Take by mouth.  . Multiple Vitamin (MULTIVITAMIN) tablet Take 1 tablet by mouth daily.  . Omega-3 Fatty Acids (FISH OIL) 1000 MG CAPS Take one by mouth two times a day  . pantoprazole (PROTONIX) 40 MG tablet Take 1 tablet (40 mg total) by mouth daily.  . sertraline (ZOLOFT) 100 MG tablet Take 1 tablet (100 mg total) by  mouth daily.  . traMADol (ULTRAM) 50 MG tablet Take 1 tablet (50 mg total) by mouth 3 (three) times daily as needed for moderate pain.  . vitamin B-12 (CYANOCOBALAMIN) 1000 MCG tablet Take 1,000 mcg by mouth daily.  . [DISCONTINUED] benzonatate (TESSALON) 200 MG capsule Take 1 capsule (200 mg total) by mouth 3 (three) times daily as needed for cough.  . [DISCONTINUED] naproxen (NAPROSYN) 375 MG tablet Take 1 tablet (375 mg total) by mouth 2 (two) times daily with a meal.   No facility-administered encounter medications on file as of 02/26/2017.     Activities of Daily Living In your present state of health, do you have any difficulty performing the following activities: 02/26/2017  Hearing? N  Vision? N  Difficulty concentrating or making decisions? N  Walking or climbing stairs? N  Dressing or bathing? N  Doing errands, shopping? N  Preparing Food and eating  ? N  Using the Toilet? N  In the past six months, have you accidently leaked urine? N  Do you have problems with loss of bowel control? N  Managing your Medications? N  Managing your Finances? N  Housekeeping or managing your Housekeeping? N  Some recent data might be hidden    Patient Care Team: Ria Bush, MD as PCP - General (Family Medicine) Jovita Gamma, MD as Consulting Physician (Neurosurgery) Neil Crouch, OD as Referring Physician (Optometry)    Assessment:     Hearing Screening   125Hz  250Hz  500Hz  1000Hz  2000Hz  3000Hz  4000Hz  6000Hz  8000Hz   Right ear:   40 40 40  0    Left ear:   40 0 40  40    Vision Screening Comments: Last vision exam approx. 6 mths ago with Dr. Youlanda Mighty, [place]  Exercise Activities and Dietary recommendations Current Exercise Habits: Home exercise routine, Type of exercise: treadmill;Other - see comments (stationary bike), Time (Minutes): 60, Frequency (Times/Week): 3, Weekly Exercise (Minutes/Week): 180, Intensity: Moderate, Exercise limited by: None identified  Goals    . Increase physical activity          Starting 02/26/2017, I will continue to exercise for at least 60 min three weekly as schedule permits.       Fall Risk Fall Risk  02/26/2017 02/21/2016 03/16/2015 08/28/2013 08/27/2012  Falls in the past year? No No No No No   Depression Screen PHQ 2/9 Scores 02/26/2017 02/21/2016 03/16/2015 08/28/2013  PHQ - 2 Score 0 0 0 0  PHQ- 9 Score 1 - - -     Cognitive Function MMSE - Mini Mental State Exam 02/26/2017 02/21/2016  Orientation to time 5 5  Orientation to Place 5 5  Registration 3 3  Attention/ Calculation 0 0  Recall 3 3  Language- name 2 objects 0 0  Language- repeat 1 1  Language- follow 3 step command 3 3  Language- read & follow direction 0 0  Write a sentence 0 0  Copy design 0 0  Total score 20 20     PLEASE NOTE: A Mini-Cog screen was completed. Maximum score is 20. A value of 0 denotes this part of [place]  MMSE was not completed or the patient failed this part of the Mini-Cog screening.   Mini-Cog Screening Orientation to Time - Max 5 pts Orientation to Place - Max 5 pts Registration - Max 3 pts Recall - Max 3 pts Language Repeat - Max 1 pts Language Follow 3 Step Command - Max 3 pts     Immunization History  Administered Date(s) Administered  . Influenza,inj,Quad PF,36+ Mos 03/16/2015  . Influenza-Unspecified 04/15/2013, 04/15/2014, 04/16/2016  . Pneumococcal Conjugate-13 08/28/2013  . Pneumococcal Polysaccharide-23 08/27/2012  . Tdap 08/27/2012  . Zoster 07/16/2010   Screening Tests Health Maintenance  Topic Date Due  . INFLUENZA VACCINE  10/13/2017 (Originally 02/13/2017)  . MAMMOGRAM  10/22/2017  . COLONOSCOPY  07/02/2018  . DTaP/Tdap/Td (2 - Td) 08/27/2022  . TETANUS/TDAP  08/27/2022  . DEXA SCAN  Completed  . Hepatitis C Screening  Completed  . PNA vac Low Risk Adult  Completed      Plan:     I have personally reviewed and addressed the Medicare Annual Wellness questionnaire and have noted the following in the patient's chart:  A. Medical and social history B. Use of alcohol, tobacco or illicit drugs  C. Current medications and supplements D. Functional ability and status E.  Nutritional status F.  Physical activity G. Advance directives H. List of other physicians I.  Hospitalizations, surgeries, and ER visits in previous 12 months J.  [place] to include hearing, vision, cognitive, depression L. Referrals and appointments - none  In addition, I have reviewed and discussed with patient certain preventive protocols, quality metrics, and best practice recommendations. A written personalized care plan for preventive services as well as general preventive health recommendations were provided to patient.  See attached scanned questionnaire for additional information.   Signed,   Lindell Noe, MHA, BS, LPN Health Coach

## 2017-02-27 NOTE — Progress Notes (Signed)
PCP notes:   Health maintenance:  Flu vaccine - addressed  Abnormal screenings:   Hearing - failed Depression score: 1  Patient concerns:   None  Nurse concerns:  None  Next PCP appt:   03/05/17 @ 1130

## 2017-02-27 NOTE — Progress Notes (Signed)
I reviewed health advisor's note, was available for consultation, and agree with documentation and plan.  

## 2017-02-28 ENCOUNTER — Telehealth: Payer: Self-pay

## 2017-02-28 NOTE — Telephone Encounter (Signed)
lmov to scheduled 1 yr carotid

## 2017-03-05 ENCOUNTER — Ambulatory Visit (INDEPENDENT_AMBULATORY_CARE_PROVIDER_SITE_OTHER): Payer: Medicare HMO | Admitting: Family Medicine

## 2017-03-05 ENCOUNTER — Encounter: Payer: Self-pay | Admitting: Family Medicine

## 2017-03-05 VITALS — BP 120/74 | HR 61 | Temp 98.1°F | Ht 62.0 in | Wt 169.5 lb

## 2017-03-05 DIAGNOSIS — F331 Major depressive disorder, recurrent, moderate: Secondary | ICD-10-CM

## 2017-03-05 DIAGNOSIS — I499 Cardiac arrhythmia, unspecified: Secondary | ICD-10-CM

## 2017-03-05 DIAGNOSIS — I6529 Occlusion and stenosis of unspecified carotid artery: Secondary | ICD-10-CM | POA: Diagnosis not present

## 2017-03-05 DIAGNOSIS — Z7189 Other specified counseling: Secondary | ICD-10-CM

## 2017-03-05 DIAGNOSIS — D649 Anemia, unspecified: Secondary | ICD-10-CM | POA: Diagnosis not present

## 2017-03-05 DIAGNOSIS — E669 Obesity, unspecified: Secondary | ICD-10-CM

## 2017-03-05 DIAGNOSIS — E7801 Familial hypercholesterolemia: Secondary | ICD-10-CM

## 2017-03-05 DIAGNOSIS — N183 Chronic kidney disease, stage 3 unspecified: Secondary | ICD-10-CM

## 2017-03-05 DIAGNOSIS — Z0000 Encounter for general adult medical examination without abnormal findings: Secondary | ICD-10-CM | POA: Diagnosis not present

## 2017-03-05 DIAGNOSIS — I10 Essential (primary) hypertension: Secondary | ICD-10-CM

## 2017-03-05 DIAGNOSIS — I77811 Abdominal aortic ectasia: Secondary | ICD-10-CM

## 2017-03-05 DIAGNOSIS — E66811 Obesity, class 1: Secondary | ICD-10-CM

## 2017-03-05 DIAGNOSIS — R69 Illness, unspecified: Secondary | ICD-10-CM | POA: Diagnosis not present

## 2017-03-05 MED ORDER — EZETIMIBE 10 MG PO TABS: 10.0000 mg | ORAL_TABLET | Freq: Every day | ORAL | 1 refills | Status: DC

## 2017-03-05 NOTE — Assessment & Plan Note (Signed)
Chronic, stable off blood pressure medication.

## 2017-03-05 NOTE — Patient Instructions (Addendum)
If interested, check with pharmacy about new 2 shot shingles series (shingrix).  Bring [place] copy of advanced directive to update your chart. Price out zetia or welchol - different cholesterol medicines.  Try out zetia ([facility]etimibe) at [facility] - prescription and discount provided today  Return in 6 months for follow up visit and labs.  EKG today - irregular but not consistent with atrial fibrillation - we will refer you to heart doctor for further evaluation of heart rhythm.   Health Maintenance, Female Adopting a healthy lifestyle and getting preventive care can go a long way to promote health and wellness. Talk with your health care provider about what schedule of regular examinations is right for you. This is a good chance for you to check in with your provider about disease prevention and staying healthy. In between checkups, there are plenty of things you can do on your own. Experts have done a lot of research about which lifestyle changes and preventive measures are most likely to keep you healthy. Ask your health care provider for more information. Weight and diet Eat a healthy diet  Be sure to include plenty of vegetables, fruits, low-fat dairy products, and lean protein.  Do not eat a lot of foods high in solid fats, added sugars, or salt.  Get regular exercise. This is one of the most important things you can do for your health. ? Most adults should exercise for at least 150 minutes each week. The exercise should increase your heart rate and make you sweat (moderate-intensity exercise). ? Most adults should also do strengthening exercises at least twice a week. This is in addition to the moderate-intensity exercise.  Maintain a healthy weight  Body mass index (BMI) is a measurement that can be used to identify possible weight problems. It estimates body fat based on height and weight. Your health care provider can help determine your BMI and help you achieve or maintain a healthy  weight.  For females 67 years of age and older: ? A BMI below 18.5 is considered underweight. ? A BMI of 18.5 to 24.9 is normal. ? A BMI of 25 to 29.9 is considered overweight. ? A BMI of 30 and above is considered obese.  Watch levels of cholesterol and blood lipids  You should start having your blood tested for lipids and cholesterol at 70 years of age, then have this test every 5 years.  You may need to have your cholesterol levels checked more often if: ? Your lipid or cholesterol levels are high. ? You are older than 70 years of age. ? You are at high risk for heart disease.  Cancer screening Lung Cancer  Lung cancer screening is recommended for adults 19-92 years old who are at high risk for lung cancer because of a history of smoking.  A yearly low-dose CT scan of the lungs is recommended for people who: ? Currently smoke. ? Have quit within the past 15 years. ? Have at least a 30-pack-year history of smoking. A pack year is smoking an average of one pack of cigarettes a day for 1 year.  Yearly screening should continue until it has been 15 years since you quit.  Yearly screening should stop if you develop a health problem that would prevent you from having lung cancer treatment.  Breast Cancer  Practice breast self-awareness. This means understanding how your breasts normally appear and feel.  It also means doing regular breast self-exams. Let your health care provider know about any changes,  no matter how small.  If you are in your 20s or 30s, you should have a clinical breast exam (CBE) by a health care provider every 1-3 years as part of a regular health exam.  If you are 68 or older, have a CBE every year. Also consider having a breast X-ray (mammogram) every year.  If you have a family history of breast cancer, talk to your health care provider about genetic screening.  If you are at high risk for breast cancer, talk to your health care provider about having an  MRI and a mammogram every year.  Breast cancer gene (BRCA) assessment is recommended for women who have family members with BRCA-related cancers. BRCA-related cancers include: ? Breast. ? Ovarian. ? Tubal. ? Peritoneal cancers.  Results of the assessment will determine the need for genetic counseling and BRCA1 and BRCA2 testing.  Cervical Cancer Your health care provider may recommend that you be screened regularly for cancer of the pelvic organs (ovaries, uterus, and vagina). This screening involves a pelvic examination, including checking for microscopic changes to the surface of your cervix (Pap test). You may be encouraged to have this screening done every 3 years, beginning at age 7.  For women ages 62-65, health care providers may recommend pelvic exams and Pap testing every 3 years, or they may recommend the Pap and pelvic exam, combined with testing for human papilloma virus (HPV), every 5 years. Some types of HPV increase your risk of cervical cancer. Testing for HPV may also be done on women of any age with unclear Pap test results.  Other health care providers may not recommend any screening for nonpregnant women who are considered low risk for pelvic cancer and who do not have symptoms. Ask your health care provider if a screening pelvic exam is right for you.  If you have had past treatment for cervical cancer or a condition that could lead to cancer, you need Pap tests and screening for cancer for at least 20 years after your treatment. If Pap tests have been discontinued, your risk factors (such as having a new sexual partner) need to be reassessed to determine if screening should resume. Some women have medical problems that increase the chance of getting cervical cancer. In these cases, your health care provider may recommend more frequent screening and Pap tests.  Colorectal Cancer  This type of cancer can be detected and often prevented.  Routine colorectal cancer screening  usually begins at 70 years of age and continues through 70 years of age.  Your health care provider may recommend screening at an earlier age if you have risk factors for colon cancer.  Your health care provider may also recommend using home test kits to check for hidden blood in the stool.  A small camera at the end of a tube can be used to examine your colon directly (sigmoidoscopy or colonoscopy). This is done to check for the earliest forms of colorectal cancer.  Routine screening usually begins at age 55.  Direct examination of the colon should be repeated every 5-10 years through 70 years of age. However, you may need to be screened more often if early forms of precancerous polyps or small growths are found.  Skin Cancer  Check your skin from head to toe regularly.  Tell your health care provider about any new moles or changes in moles, especially if there is a change in a mole's shape or color.  Also tell your health care provider if you  have a mole that is larger than the size of a pencil eraser.  Always use sunscreen. Apply sunscreen liberally and repeatedly throughout the day.  Protect yourself by wearing long sleeves, pants, a wide-brimmed hat, and sunglasses whenever you are outside.  Heart disease, diabetes, and high blood pressure  High blood pressure causes heart disease and increases the risk of stroke. High blood pressure is more likely to develop in: ? People who have blood pressure in the high end of the normal range (130-139/85-89 mm Hg). ? People who are overweight or obese. ? People who are African American.  If you are 34-75 years of age, have your blood pressure checked every 3-5 years. If you are 60 years of age or older, have your blood pressure checked every year. You should have your blood pressure measured twice-once when you are at a hospital or clinic, and once when you are not at a hospital or clinic. Record the average of the two measurements. To check  your blood pressure when you are not at a hospital or clinic, you can use: ? An automated blood pressure machine at a pharmacy. ? A home blood pressure monitor.  If you are between 5 years and 72 years old, ask your health care provider if you should take aspirin to prevent strokes.  Have regular diabetes screenings. This involves taking a blood sample to check your fasting blood sugar level. ? If you are at a normal weight and have a low risk for diabetes, have this test once every three years after 70 years of age. ? If you are overweight and have a high risk for diabetes, consider being tested at a younger age or more often. Preventing infection Hepatitis B  If you have a higher risk for hepatitis B, you should be screened for this virus. You are considered at high risk for hepatitis B if: ? You were born in a country where hepatitis B is common. Ask your health care provider which countries are considered high risk. ? Your parents were born in a high-risk country, and you have not been immunized against hepatitis B (hepatitis B vaccine). ? You have HIV or AIDS. ? You use needles to inject street drugs. ? You live with someone who has hepatitis B. ? You have had sex with someone who has hepatitis B. ? You get hemodialysis treatment. ? You take certain medicines for conditions, including cancer, organ transplantation, and autoimmune conditions.  Hepatitis C  Blood testing is recommended for: ? Everyone born from [birth year] through 1964. ? Anyone with known risk factors for hepatitis C.  Sexually transmitted infections (STIs)  You should be screened for sexually transmitted infections (STIs) including gonorrhea and chlamydia if: ? You are sexually active and are younger than 70 years of age. ? You are older than 70 years of age and your health care provider tells you that you are at risk for this type of infection. ? Your sexual activity has changed since you were last screened and you  are at an increased risk for chlamydia or gonorrhea. Ask your health care provider if you are at risk.  If you do not have HIV, but are at risk, it may be recommended that you take a prescription medicine daily to prevent HIV infection. This is called pre-exposure prophylaxis (PrEP). You are considered at risk if: ? You are sexually active and do not regularly use condoms or know the HIV status of your partner(s). ? You take drugs by injection. ?  You are sexually active with a partner who has HIV.  Talk with your health care provider about whether you are at high risk of being infected with HIV. If you choose to begin PrEP, you should first be tested for HIV. You should then be tested every 3 months for as long as you are taking PrEP. Pregnancy  If you are premenopausal and you may become pregnant, ask your health care provider about preconception counseling.  If you may become pregnant, take 400 to 800 micrograms (mcg) of folic acid every day.  If you want to prevent pregnancy, talk to your health care provider about birth control (contraception). Osteoporosis and menopause  Osteoporosis is a disease in which the bones lose minerals and strength with aging. This can result in serious bone fractures. Your risk for osteoporosis can be identified using a bone density scan.  If you are 18 years of age or older, or if you are at risk for osteoporosis and fractures, ask your health care provider if you should be screened.  Ask your health care provider whether you should take a calcium or vitamin D supplement to lower your risk for osteoporosis.  Menopause may have certain physical symptoms and risks.  Hormone replacement therapy may reduce some of these symptoms and risks. Talk to your health care provider about whether hormone replacement therapy is right for you. Follow these instructions at home:  Schedule regular health, dental, and eye exams.  Stay current with your  immunizations.  Do not use any tobacco products including cigarettes, chewing tobacco, or electronic cigarettes.  If you are pregnant, do not drink alcohol.  If you are breastfeeding, limit how much and how often you drink alcohol.  Limit alcohol intake to no more than 1 drink per day for nonpregnant women. One drink equals 12 ounces of beer, 5 ounces of wine, or 1 ounces of hard liquor.  Do not use street drugs.  Do not share needles.  Ask your health care provider for help if you need support or information about quitting drugs.  Tell your health care provider if you often feel depressed.  Tell your health care provider if you have ever been abused or do not feel safe at home. This information is not intended to replace advice given to you by your health care provider. Make sure you discuss any questions you have with your health care provider. Document Released: 01/15/2011 Document Revised: 12/08/2015 Document Reviewed: 04/05/2015 Elsevier Interactive Patient Education  [employer].

## 2017-03-05 NOTE — Assessment & Plan Note (Addendum)
New. Concern for afib. Check EKG today.  Irregular - but I do see P waves so likely not atrial fibrillation. Will refer to cardiology for further evaluation of irregular rhythm with EKG changes. Pt asxs. No need for [facility] at this time.

## 2017-03-05 NOTE — Assessment & Plan Note (Signed)
Preventative protocols reviewed and updated unless pt declined. Discussed healthy diet and lifestyle.  

## 2017-03-05 NOTE — Assessment & Plan Note (Signed)
Chronic. fmhx HLD. Intolerant to statins including RYR. Discussed options - pt willing to try zetia - checked goodRx price - [facility] has 90d supply for <30$. Printed Rx and coupon for patient. RTC 6 mo f/u visit.  The 10-year ASCVD risk score Mikey Bussing DC Brooke Bonito., et al., 2013) is: 8.5%   Values used to calculate the score:     Age: 71 years     Sex: Female     Is Non-Hispanic African American: No     Diabetic: No     Tobacco smoker: No     Systolic Blood Pressure: 481 mmHg     Is BP treated: No     HDL Cholesterol: 47.2 mg/dL     Total Cholesterol: 255 mg/dL

## 2017-03-05 NOTE — Assessment & Plan Note (Signed)
Chronic, stable. Pt aware to stay well hydrated, avoid NSAIDs and other nephrotoxins.

## 2017-03-05 NOTE — Assessment & Plan Note (Signed)
F/u US will be due ~2021

## 2017-03-05 NOTE — Assessment & Plan Note (Signed)
Continue to monitor

## 2017-03-05 NOTE — Progress Notes (Signed)
BP 120/74   Pulse 61   Temp 98.1 F (36.7 C) (Oral)   Ht 5\' 2"  (1.575 m)   Wt 169 lb 8 oz (76.9 kg)   SpO2 99%   BMI 31.00 kg/m    CC: CPE Subjective:    Patient ID: Ann White, female    DOB: 1946-01-22, 72 y.o.   MRN: [id number]  HPI: Ann White is a 71 y.o. female presenting on 03/05/2017 for Annual Exam (Medicare pt2)   Ann White last week for medicare wellness visit. Note reviewed.   HLD - intolerance to statins including red yeast rice.  Pending carotid US next month.  Denies chest pain, palpitations, dyspnea.   Preventative: Colon cancer screening - colonoscopy 06/2013 mod diverticulosis, 3 tubular adenomas, int hem rpt 5 yrs [facility] Plan)  Well woman - with prior PCP Dr. Rocky Link. S/p hysterectomy and oophorectomy for benign reason. 1990s.  Mammogram - 10/2016 DEXA 2013 - WNL Flu shot yearly Pneumovax 2014. prevnar 08/2013 Tdap 2014 zostavax 2012.  shingrix - discussed Advanced directives: has set up through lawyer. Husband then daughter are HCPOA. Asked to bring me copy. Seat White use discussed Sunscreen use discussed. No changing moles on skin.  Non smokers  No EtOH use.   Lives with husband and 2 cats. 2 grown children, 5 grandchildren.  Occupation: retired, worked in [employer]. Activity: [facility] [employer] [facility]  Diet: fruits/vegetables daily, good water   Relevant past medical, surgical, family and social history reviewed and updated as indicated. Interim medical history since our last visit reviewed. Allergies and medications reviewed and updated. Outpatient Medications Prior to Visit  Medication Sig Dispense Refill  . Acetaminophen (ARTHRITIS PAIN RELIEF PO) Take by mouth.    . Cholecalciferol (VITAMIN D3) 1000 UNITS CAPS Take 1 capsule (1,000 Units total) by mouth daily.    . clonazePAM (KLONOPIN) 1 MG tablet TAKE 1 TABLET BY MOUTH TWICE DAILY AS NEEDED 60 tablet 0  . Docusate Calcium (STOOL SOFTENER PO) Take by mouth daily.     Marland Kitchen gabapentin (NEURONTIN)  300 MG capsule TAKE 1 CAPSULE BY MOUTH EVERY MORNING AND 2 CAPSULES BY MOUTH EVERY NIGHT AT BEDTIME 270 capsule 2  . MAGNESIUM CITRATE PO Take by mouth.    . Misc Natural Products (SINUS FORMULA PO) Take by mouth.    . Multiple Vitamin (MULTIVITAMIN) tablet Take 1 tablet by mouth daily.    . Omega-3 Fatty Acids (FISH OIL) 1000 MG CAPS Take one by mouth two times a day    . pantoprazole (PROTONIX) 40 MG tablet Take 1 tablet (40 mg total) by mouth daily. 90 tablet 3  . sertraline (ZOLOFT) 100 MG tablet Take 1 tablet (100 mg total) by mouth daily. 90 tablet 3  . traMADol (ULTRAM) 50 MG tablet Take 1 tablet (50 mg total) by mouth 3 (three) times daily as needed for moderate pain. 120 tablet 0  . vitamin B-12 (CYANOCOBALAMIN) 1000 MCG tablet Take 1,000 mcg by mouth daily.     No facility-administered medications prior to visit.      Per HPI unless specifically indicated in ROS section below Review of Systems  Constitutional: Negative for activity change, appetite change, chills, fatigue, fever and unexpected weight change.  HENT: Negative for hearing loss.   Eyes: Negative for visual disturbance.  Respiratory: Negative for cough, chest tightness, shortness of breath and wheezing.   Cardiovascular: Negative for chest pain, palpitations and leg swelling.  Gastrointestinal: Negative for abdominal distention, abdominal pain, blood  in stool, constipation, diarrhea, nausea and vomiting.  Genitourinary: Negative for difficulty urinating and hematuria.  Musculoskeletal: Negative for arthralgias, myalgias and neck pain.  Skin: Negative for rash.  Neurological: Positive for headaches. Negative for dizziness, seizures and syncope.  Hematological: Negative for adenopathy. Does not bruise/bleed easily.  Psychiatric/Behavioral: Negative for dysphoric mood. The patient is not nervous/anxious.        Objective:    BP 120/74   Pulse 61   Temp 98.1 F (36.7 C) (Oral)   Ht 5\' 2"  (1.575 m)   Wt 169 lb 8  oz (76.9 kg)   SpO2 99%   BMI 31.00 kg/m   Wt Readings from Last 3 Encounters:  03/05/17 169 lb 8 oz (76.9 kg)  02/26/17 168 lb 4 oz (76.3 kg)  12/18/16 166 lb 8 oz (75.5 kg)    Physical Exam  Constitutional: She is oriented to person, place, and time. She appears well-developed and well-nourished. No distress.  HENT:  Head: Normocephalic and atraumatic.  Right Ear: Hearing, tympanic membrane, external ear and ear canal normal.  Left Ear: Hearing, tympanic membrane, external ear and ear canal normal.  Nose: Nose normal.  Mouth/Throat: Uvula is midline, oropharynx is clear and moist and mucous membranes are normal. No oropharyngeal exudate, posterior oropharyngeal edema or posterior oropharyngeal erythema.  Eyes: Pupils are equal, round, and reactive to light. Conjunctivae and EOM are normal. No scleral icterus.  Neck: Normal range of motion. Neck supple. Carotid bruit is not present. No thyromegaly present.  Cardiovascular: Normal rate and intact distal pulses.  An irregularly irregular rhythm present.  Murmur (3/6 systolic) heard. Pulses:      Radial pulses are 2+ on the right side, and 2+ on the left side.  Pulmonary/Chest: Effort normal and breath sounds normal. No respiratory distress. She has no wheezes. She has no rales.  Abdominal: Soft. Bowel sounds are normal. She exhibits no distension and no mass. There is no tenderness. There is no rebound and no guarding.  Musculoskeletal: Normal range of motion. She exhibits no edema.  Lymphadenopathy:    She has no cervical adenopathy.  Neurological: She is alert and oriented to person, place, and time.  CN grossly intact, station and gait intact  Skin: Skin is warm and dry. No rash noted.  Psychiatric: She has a normal mood and affect. Her behavior is normal. Judgment and thought content normal.  Nursing note and vitals reviewed.  Results for orders placed or performed in visit on 02/26/17  Renal Function Panel  Result Value Ref  Range   Sodium 137 135 - 145 mEq/L   Potassium 4.3 3.5 - 5.1 mEq/L   Chloride 102 96 - 112 mEq/L   CO2 30 19 - 32 mEq/L   Calcium 9.4 8.4 - 10.5 mg/dL   Albumin 4.8 3.5 - 5.2 g/dL   BUN 24 (H) 6 - 23 mg/dL   Creatinine, Ser 1.03 0.40 - 1.20 mg/dL   Glucose, Bld 88 70 - 99 mg/dL   Phosphorus 4.7 (H) 2.3 - 4.6 mg/dL   GFR 56.33 (L) >60.00 mL/min  CBC with Differential/Platelet  Result Value Ref Range   WBC 5.0 4.0 - 10.5 K/uL   RBC 3.71 (L) 3.87 - 5.11 Mil/uL   Hemoglobin 11.3 (L) 12.0 - 15.0 g/dL   HCT 33.5 (L) 36.0 - 46.0 %   MCV 90.4 78.0 - 100.0 fl   MCHC 33.6 30.0 - 36.0 g/dL   RDW 14.1 11.5 - 15.5 %   Platelets 175.0  150.0 - 400.0 K/uL   Neutrophils Relative % 58.0 43.0 - 77.0 %   Lymphocytes Relative 32.8 12.0 - 46.0 %   Monocytes Relative 4.4 3.0 - 12.0 %   Eosinophils Relative 4.2 0.0 - 5.0 %   Basophils Relative 0.6 0.0 - 3.0 %   Neutro Abs 2.9 1.4 - 7.7 K/uL   Lymphs Abs 1.6 0.7 - 4.0 K/uL   Monocytes Absolute 0.2 0.1 - 1.0 K/uL   Eosinophils Absolute 0.2 0.0 - 0.7 K/uL   Basophils Absolute 0.0 0.0 - 0.1 K/uL  Vitamin D, 25-hydroxy  Result Value Ref Range   VITD 40.40 30.00 - 100.00 ng/mL  Lipid Panel  Result Value Ref Range   Cholesterol 255 (H) 0 - 200 mg/dL   Triglycerides 217.0 (H) 0.0 - 149.0 mg/dL   HDL 47.20 >39.00 mg/dL   VLDL 43.4 (H) 0.0 - 40.0 mg/dL   Total CHOL/HDL Ratio 5    NonHDL 207.77   LDL cholesterol, direct  Result Value Ref Range   Direct LDL 171.0 mg/dL   EKG - irregular but I do see P waves. Normal axis. Diffuse T wave flattening.     Assessment & Plan:   Problem List Items Addressed This Visit    Advanced care planning/counseling discussion    Advanced directives: has set up through lawyer. Husband then daughter are HCPOA. Asked to bring me copy.      Anemia, unspecified    Continue to monitor.       Carotid stenosis    F/u US planned later this year      Relevant Medications   ezetimibe (ZETIA) 10 MG tablet   CKD  (chronic kidney disease) stage 3, GFR 30-59 ml/min    Chronic, stable. Pt aware to stay well hydrated, avoid NSAIDs and other nephrotoxins.      Ectatic abdominal aorta ([place])    F/u US will be due ~2021      Relevant Medications   ezetimibe (ZETIA) 10 MG tablet   Health maintenance examination - Primary    Preventative protocols reviewed and updated unless pt declined. Discussed healthy diet and lifestyle.       HLD (hyperlipidemia)    Chronic. fmhx HLD. Intolerant to statins including RYR. Discussed options - pt willing to try zetia - checked goodRx price - [facility] has 90d supply for <30$. Printed Rx and coupon for patient. RTC 6 mo f/u visit.  The 10-year ASCVD risk score Mikey Bussing DC Brooke Bonito., et al., 2013) is: 8.5%   Values used to calculate the score:     Age: 76 years     Sex: Female     Is Non-Hispanic African American: No     Diabetic: No     Tobacco smoker: No     Systolic Blood Pressure: 601 mmHg     Is BP treated: No     HDL Cholesterol: 47.2 mg/dL     Total Cholesterol: 255 mg/dL       Relevant Medications   ezetimibe (ZETIA) 10 MG tablet   HTN (hypertension)    Chronic, stable off blood pressure medication.       Relevant Medications   ezetimibe (ZETIA) 10 MG tablet   Irregular heart beat    New. Concern for afib. Check EKG today.  Irregular - but I do see P waves so likely not atrial fibrillation. Will refer to cardiology for further evaluation of irregular rhythm with EKG changes. Pt asxs. No need for [facility] at this time.  Relevant Orders   EKG 12-Lead (Completed)   Ambulatory referral to Cardiology   MDD (major depressive disorder), recurrent episode, moderate (HCC)    Stable period - continue sertraline 100mg  daily.      Obesity, Class I, BMI 30-34.9    Discussed healthy diet and lifestyle changes to affect sustainable weight loss.          Follow up plan: Return in about 6 months (around 09/05/2017) for follow up visit.  Ria Bush, MD

## 2017-03-05 NOTE — Assessment & Plan Note (Signed)
F/u US planned later this year

## 2017-03-05 NOTE — Assessment & Plan Note (Signed)
Advanced directives: has set up through lawyer. Husband then daughter are HCPOA. Asked to bring me copy. 

## 2017-03-05 NOTE — Assessment & Plan Note (Addendum)
Discussed healthy diet and lifestyle changes to affect sustainable weight loss  

## 2017-03-05 NOTE — Assessment & Plan Note (Signed)
Stable period - continue sertraline 100mg  daily.

## 2017-03-11 ENCOUNTER — Other Ambulatory Visit: Payer: Self-pay | Admitting: Family Medicine

## 2017-03-13 ENCOUNTER — Other Ambulatory Visit: Payer: Self-pay | Admitting: Family Medicine

## 2017-03-13 DIAGNOSIS — I6523 Occlusion and stenosis of bilateral carotid arteries: Secondary | ICD-10-CM

## 2017-03-25 ENCOUNTER — Ambulatory Visit: Payer: Medicare HMO

## 2017-03-25 ENCOUNTER — Other Ambulatory Visit: Payer: Self-pay | Admitting: Family Medicine

## 2017-03-25 DIAGNOSIS — I6523 Occlusion and stenosis of bilateral carotid arteries: Secondary | ICD-10-CM | POA: Diagnosis not present

## 2017-03-25 NOTE — Telephone Encounter (Signed)
plz phone in. 

## 2017-03-25 NOTE — Telephone Encounter (Signed)
Last fill 02/25/17, #60 last OV 03/05/17 next OV 03/13/18

## 2017-03-26 ENCOUNTER — Other Ambulatory Visit: Payer: Self-pay | Admitting: Family Medicine

## 2017-03-26 LAB — VAS US CAROTID
LEFT ECA DIAS: -24 cm/s
LEFT VERTEBRAL DIAS: -31 cm/s
Left CCA dist dias: -24 cm/s
Left CCA dist sys: -96 cm/s
Left CCA prox dias: 31 cm/s
Left CCA prox sys: 84 cm/s
Left ICA dist dias: -46 cm/s
Left ICA dist sys: -124 cm/s
Left ICA prox dias: 34 cm/s
Left ICA prox sys: 96 cm/s
RIGHT ECA DIAS: 54 cm/s
RIGHT VERTEBRAL DIAS: -29 cm/s
Right CCA prox dias: 21 cm/s
Right CCA prox sys: 62 cm/s
Right cca dist sys: 128 cm/s

## 2017-03-26 NOTE — Telephone Encounter (Signed)
Refill left on vm at pharmacy. Notified pt rx was called in.

## 2017-03-26 NOTE — Telephone Encounter (Signed)
plz double check - should have been phoned in yesterday.

## 2017-03-27 ENCOUNTER — Other Ambulatory Visit: Payer: Self-pay | Admitting: Family Medicine

## 2017-03-27 NOTE — Telephone Encounter (Signed)
I advised pt that clonazepam is being called in now to [address] by [facility] CMA and I updated pt pharmacy listing.

## 2017-03-27 NOTE — Telephone Encounter (Signed)
Pt left /vm requesting status of refill for clonazepam to [facility] [address]. Pt said she got the message that the med was called in. [facility] [address] is telling pt did not get refill call. Was it called to [place] [address] or [facility] ? Pt request cb.

## 2017-03-27 NOTE — Telephone Encounter (Signed)
Tramadol last filled 10/31/16 #120, last CSA and uds 03/16/15.  Clonazepam last filled 02/25/17 #60, last OV CPE 03/05/17, next OV 03/13/18

## 2017-03-27 NOTE — Telephone Encounter (Addendum)
Called refill in to [facility] per pt request. Canceled rx previously called in to [facility] with [place]. Pt is aware.

## 2017-03-29 NOTE — Telephone Encounter (Signed)
Left refill on voice mail at pharmacy  

## 2017-03-29 NOTE — Telephone Encounter (Signed)
plz phone in. 

## 2017-04-04 NOTE — Telephone Encounter (Signed)
Stephanie at [address] left v/m requesting cb needing to know if pain med is for acute or chronic pain and also needs dx code.

## 2017-04-05 NOTE — Telephone Encounter (Signed)
Refill left on vm at pharmacy.  

## 2017-04-05 NOTE — Telephone Encounter (Signed)
chronic pain. Dx O14.996

## 2017-04-15 DIAGNOSIS — R69 Illness, unspecified: Secondary | ICD-10-CM | POA: Diagnosis not present

## 2017-04-16 ENCOUNTER — Other Ambulatory Visit: Payer: Self-pay

## 2017-04-16 NOTE — Telephone Encounter (Signed)
Pt left v/m requesting refill gabapentin to [facility] [address]. Pt had CPX on 04/15/17; last refilled # 270 x 2 05/2016. Pt request cb when refill done.

## 2017-04-17 ENCOUNTER — Ambulatory Visit (INDEPENDENT_AMBULATORY_CARE_PROVIDER_SITE_OTHER): Payer: Medicare HMO | Admitting: Internal Medicine

## 2017-04-17 ENCOUNTER — Telehealth: Payer: Self-pay | Admitting: Internal Medicine

## 2017-04-17 ENCOUNTER — Encounter: Payer: Self-pay | Admitting: Internal Medicine

## 2017-04-17 VITALS — BP 150/70 | HR 99 | Ht 64.0 in | Wt 168.8 lb

## 2017-04-17 DIAGNOSIS — R9431 Abnormal electrocardiogram [ECG] [EKG]: Secondary | ICD-10-CM | POA: Insufficient documentation

## 2017-04-17 DIAGNOSIS — I491 Atrial premature depolarization: Secondary | ICD-10-CM | POA: Insufficient documentation

## 2017-04-17 DIAGNOSIS — I10 Essential (primary) hypertension: Secondary | ICD-10-CM | POA: Diagnosis not present

## 2017-04-17 DIAGNOSIS — E782 Mixed hyperlipidemia: Secondary | ICD-10-CM

## 2017-04-17 DIAGNOSIS — M79605 Pain in left leg: Secondary | ICD-10-CM

## 2017-04-17 DIAGNOSIS — M79604 Pain in right leg: Secondary | ICD-10-CM | POA: Diagnosis not present

## 2017-04-17 MED ORDER — GABAPENTIN 300 MG PO CAPS: ORAL_CAPSULE | ORAL | 2 refills | Status: DC

## 2017-04-17 NOTE — Telephone Encounter (Signed)
Left message on vm per dpr notifying pt rx was sent to pharmacy.  

## 2017-04-17 NOTE — Patient Instructions (Signed)
Medication Instructions:  Your physician recommends that you continue on your current medications as directed. Please refer to the Current Medication list given to you today.   Labwork: Your physician recommends that you return for lab work in: TODAY (MG, TSH).   Testing/Procedures: Your physician has requested that you have an echocardiogram. Echocardiography is a painless test that uses sound waves to create images of your heart. It provides your doctor with information about the size and shape of your heart and how well your heart's chambers and valves are working. This procedure takes approximately one hour. There are no restrictions for this procedure.   Your physician has recommended that you wear a 48 HOUR holter monitor. Holter monitors are medical devices that record the heart's electrical activity. Doctors most often use these monitors to diagnose arrhythmias. Arrhythmias are problems with the speed or rhythm of the heartbeat. The monitor is a small, portable device. You can wear one while you do your normal daily activities. This is usually used to diagnose what is causing palpitations/syncope (passing out).    Follow-Up: Your physician recommends that you schedule a follow-up appointment in: 3 MONTHS WITH DR END.   If you need a refill on your cardiac medications before your next appointment, please call your pharmacy.    Echocardiogram An echocardiogram, or echocardiography, uses sound waves (ultrasound) to produce an image of your heart. The echocardiogram is simple, painless, obtained within a short period of time, and offers valuable information to your health care provider. The images from an echocardiogram can provide information such as:  Evidence of coronary artery disease (CAD).  Heart size.  Heart muscle function.  Heart valve function.  Aneurysm detection.  Evidence of a past heart attack.  Fluid buildup around the heart.  Heart muscle  thickening.  Assess heart valve function.  Tell a health care provider about:  Any allergies you have.  All medicines you are taking, including vitamins, herbs, eye drops, creams, and over-the-counter medicines.  Any problems you or family members have had with anesthetic medicines.  Any blood disorders you have.  Any surgeries you have had.  Any medical conditions you have.  Whether you are pregnant or may be pregnant. What happens before the procedure? No special preparation is needed. Eat and drink normally. What happens during the procedure?  In order to produce an image of your heart, gel will be applied to your chest and a wand-like tool (transducer) will be moved over your chest. The gel will help transmit the sound waves from the transducer. The sound waves will harmlessly bounce off your heart to allow the heart images to be captured in real-time motion. These images will then be recorded.  You may need an IV to receive a medicine that improves the quality of the pictures. What happens after the procedure? You may return to your normal schedule including diet, activities, and medicines, unless your health care provider tells you otherwise. This information is not intended to replace advice given to you by your health care provider. Make sure you discuss any questions you have with your health care provider. Document Released: 06/29/2000 Document Revised: 02/18/2016 Document Reviewed: 03/09/2013 Elsevier Interactive Patient Education  2017 [name].    Holter Monitoring A Holter monitor is a small device that is used to detect abnormal heart rhythms. It clips to your clothing and is connected by wires to flat, sticky disks (electrodes) that attach to your chest. It is worn continuously for 24-48 hours. Follow these  instructions at home:  Wear your Holter monitor at all times, even while exercising and sleeping, for as long as directed by your health care  provider.  Make sure that the Holter monitor is safely clipped to your clothing or close to your body as recommended by your health care provider.  Do not get the monitor or wires wet.  Do not put body lotion or moisturizer on your chest.  Keep your skin clean.  Keep a diary of your daily activities, such as walking and doing chores. If you feel that your heartbeat is abnormal or that your heart is fluttering or skipping a beat: ? Record what you are doing when it happens. ? Record what time of day the symptoms occur.  Return your Holter monitor as directed by your health care provider.  Keep all follow-up visits as directed by your health care provider. This is important. Get help right away if:  You feel lightheaded or you faint.  You have trouble breathing.  You feel pain in your chest, upper arm, or jaw.  You feel sick to your stomach and your skin is pale, cool, or damp.  You heartbeat feels unusual or abnormal. This information is not intended to replace advice given to you by your health care provider. Make sure you discuss any questions you have with your health care provider. Document Released: 03/30/2004 Document Revised: 12/08/2015 Document Reviewed: 02/08/2014 Elsevier Interactive Patient Education  [employer].

## 2017-04-17 NOTE — Progress Notes (Signed)
New Outpatient Visit Date: 04/17/2017  Referring Provider: Ria Bush, MD [place], [place] [address]  Chief Complaint: Irregular heartbeat  HPI:  Ms. Olesky is a 71 y.o. female who is being seen today for the evaluation of irregular heartbeat at the request of Dr. Danise Mina. She has a history of carotid artery stenosis, abdominal aorta ectasia, right calf DVT, hypertension, hyperlipidemia, and chronic kidney disease. Today, Ms. Deveny reports feeling well. Her only complaint is of occasional discomfort in her legs when standing or having been walking a lot during the day. She denies chest pain, shortness of breath, palpitations, lightheadedness, and edema. She was previously on blood pressure medications, though her blood pressure has normalized and she is now off therapy. She consumes 2-3 16 ounce bottles of diet soda (caffeinated) a day. She does not drink coffee or use other stimulants.  She has not had any cardiac workup other than EKGs. During her physical exam in August, Dr. Danise Mina noted irregular heartbeat and subsequent EKG demonstrating frequent PACs versus wandering pacemaker.  --------------------------------------------------------------------------------------------------  Cardiovascular History & Procedures: Cardiovascular Problems:  Frequent PACs versus wandering pacemaker  Risk Factors:  Hypertension, hyperlipidemia, and age greater than 36  Cath/PCI:  None  CV Surgery:  None  EP Procedures and Devices:  None  Non-Invasive Evaluation(s):  None  Recent CV Pertinent Labs: Lab Results  Component Value Date   CHOL 255 (H) 02/26/2017   HDL 47.20 02/26/2017   LDLDIRECT 171.0 02/26/2017   TRIG 217.0 (H) 02/26/2017   CHOLHDL 5 02/26/2017   INR 1.5 09/22/2015   INR 7.0 Repeated and verified X2. ([place]) 06/13/2015   K 4.3 02/26/2017   BUN 24 (H) 02/26/2017   CREATININE 1.03 02/26/2017   CREATININE 1.19 07/26/2011     --------------------------------------------------------------------------------------------------  Past Medical History:  Diagnosis Date  . Arthritis   . Carotid stenosis 03/14/2016   Bilateral 40-59%, rpt 1 yr (02/2016)  . Cervical spondylosis    s/p spine injections  . Depression   . Ectatic abdominal aorta ([place]) 2016   rpt [place] 5 yrs  . GERD (gastroesophageal reflux disease)   . History of chicken pox   . History of diverticulitis of colon   . HLD (hyperlipidemia)   . HTN (hypertension)   . Irritable bowel syndrome with constipation   . Lichen sclerosus et atrophicus   . Obesity   . Seasonal allergic rhinitis     Past Surgical History:  Procedure Laterality Date  . bladder tack  1990s  . BREAST BIOPSY Right 03/05/2012    benign  . CARPAL TUNNEL RELEASE  2004, 2013   bilateral  . CHOLECYSTECTOMY  1990s  . COLONOSCOPY  2002  . COLONOSCOPY  06/2013   mod diverticulosis, 3 tubular adenomas, int hem rpt 5 yrs [place] [facility])  . DEXA  07/2011   normal, T score -0.3  . ESOPHAGOGASTRODUODENOSCOPY ENDOSCOPY  2004   normal [facility])  . TOTAL ABDOMINAL HYSTERECTOMY W/ BILATERAL SALPINGOOPHORECTOMY  1990s   complete, dysmenorrhea and fibroids    Current Meds  Medication Sig  . Acetaminophen (ARTHRITIS PAIN RELIEF PO) Take by mouth.  . Cholecalciferol (VITAMIN D3) 1000 UNITS CAPS Take 1 capsule (1,000 Units total) by mouth daily.  . clonazePAM (KLONOPIN) 1 MG tablet TAKE 1 TABLET BY MOUTH TWICE DAILY AS NEEDED  . Docusate Calcium (STOOL SOFTENER PO) Take by mouth daily.   Marland Kitchen gabapentin (NEURONTIN) 300 MG capsule TAKE 1 CAPSULE BY MOUTH EVERY MORNING AND 2 CAPSULES BY MOUTH EVERY  NIGHT AT BEDTIME  . MAGNESIUM CITRATE PO Take by mouth.  . Misc Natural Products (SINUS FORMULA PO) Take by mouth.  . Multiple Vitamin (MULTIVITAMIN) tablet Take 1 tablet by mouth daily.  . Omega-3 Fatty Acids (FISH OIL) 1000 MG CAPS Take one by mouth two times a day  . pantoprazole (PROTONIX) 40 MG tablet  TAKE 1 TABLET BY MOUTH ONCE DAILY  . sertraline (ZOLOFT) 100 MG tablet TAKE 1 TABLET BY MOUTH ONCE DAILY  . traMADol (ULTRAM) 50 MG tablet TAKE 1 TABLET BY MOUTH THREE TIMES DAILY AS NEEDED  . vitamin B-12 (CYANOCOBALAMIN) 1000 MCG tablet Take 1,000 mcg by mouth daily.    Allergies: Sulfa antibiotics; Tricor [fenofibrate]; Zetia [ezetimibe]; Contrast media [iodinated diagnostic agents]; Lipitor [atorvastatin]; and Pravastatin  Social History   Social History  . Marital status: Married    Spouse name: N/A  . Number of children: N/A  . Years of education: N/A   Occupational History  . Not on file.   Social History Main Topics  . Smoking status: Former Smoker    Packs/day: 0.25    Years: 3.00    Types: Cigarettes    Quit date: 48  . Smokeless tobacco: Never Used     Comment: minimal smoking history  . Alcohol use No  . Drug use: No  . Sexual activity: No   Other Topics Concern  . Not on file   Social History Narrative   Lives with husband and 2 cats.  2 grown children, 5 grandchildren.   Occupation: retired, worked in [employer]   Activity: no regular exercise.  Does walk in summer   Diet: fruits/vegetables daily, good water.    Family History  Problem Relation Age of Onset  . CAD Mother 90       MI  . Hypertension Mother   . Parkinson's disease Father   . Diabetes Father   . CAD Father 54       MI  . Hyperlipidemia Sister   . Cancer Neg Hx   . Stroke Neg Hx   . Colon cancer Neg Hx     Review of Systems: Ms. Mariner recently stopped taking ezetimibe secondary to dizziness. She has also been intolerant of statins and fibrates in the past. Otherwise, a 12-system review of systems was performed and was negative except as noted in the HPI.  --------------------------------------------------------------------------------------------------  Physical Exam: BP (!) 150/70 (BP Location: Right Arm, Patient Position: Sitting, Cuff Size: Normal)   Pulse 99   Ht 5\' 4"  (1.626 m)    Wt 168 lb 12 oz (76.5 kg)   BMI 28.97 kg/m   General:  Overweight woman, seated comfortably in the exam room. HEENT: No conjunctival pallor or scleral icterus. Moist mucous membranes. OP clear. Neck: Supple without lymphadenopathy, thyromegaly, JVD, or HJR. No carotid bruit. Lungs: Normal work of breathing. Clear to auscultation bilaterally without wheezes or crackles. Heart: Regular rate and rhythm with occasional extrasystoles. 1/6 systolic murmur loudest at the left lower sternal border. No rubs or gallops. Abd: Bowel sounds present. Soft, NT/ND without hepatosplenomegaly Ext: No lower extremity edema. Prominent fat pad overlying the lateral malleolus, right greater than left. 2+ radial and posterior tibial pulses bilaterally. Left dorsalis pedis pulses 2+. Right dorsalis pedis pulse is trace. Skin: Warm and dry without rash. Varicose veins noted in both calves. Neuro: CNIII-XII intact. Strength and fine-touch sensation intact in upper and lower extremities bilaterally. Psych: Normal mood and affect.  EKG:  Sinus rhythm with frequent PACs  versus wandering pacemaker. Nonspecific T-wave changes.  Lab Results  Component Value Date   WBC 5.0 02/26/2017   HGB 11.3 (L) 02/26/2017   HCT 33.5 (L) 02/26/2017   MCV 90.4 02/26/2017   PLT 175.0 02/26/2017    Lab Results  Component Value Date   NA 137 02/26/2017   K 4.3 02/26/2017   CL 102 02/26/2017   CO2 30 02/26/2017   BUN 24 (H) 02/26/2017   CREATININE 1.03 02/26/2017   GLUCOSE 88 02/26/2017   ALT 17 05/16/2015    Lab Results  Component Value Date   CHOL 255 (H) 02/26/2017   HDL 47.20 02/26/2017   LDLDIRECT 171.0 02/26/2017   TRIG 217.0 (H) 02/26/2017   CHOLHDL 5 02/26/2017    --------------------------------------------------------------------------------------------------  ASSESSMENT AND PLAN: PACs and abnormal EKG EKG today again demonstrates irregular rhythm. I see P waves prior to most QRS complexes, albeit with  varying morphologies. This suggests frequent PACs and/or wandering pacemaker. She is asymptomatic this time. We have agreed to obtain a transthoracic echocardiogram to evaluate for structural abnormalities as well as a 48-hour Holter monitor to evaluate the frequency of her atrial arrhythmia and overall heart rate. Based on results, we will discuss the need for pharmacotherapy, including addition of a beta blocker. I do not believe this represents atrial fibrillation; therefore anticoagulation is not indicated. There is mention of ulnar fibrosis in the patient's chart, though she denies a history of any prior lung disease. This is notable because PACs and MAT can be associated with pulmonary disease. We will defer pulmonary evaluation pending the echo and Holter monitor. I will check a TSH and magnesium level today. I have also advised Ms. Spiers to cut down/stop her caffeine intake.  Hypertension Blood pressure is mildly elevated today, though it has been normal at recent PCP visits. Ms. Liwanag reports being somewhat hurried and nervous today. We will defer adding medications at this time.  Hyperlipidemia Ms. Bobrowski has been intolerant of multiple statins in the past and did not do well with ezetimibe for fibroids either. I will defer further management to Dr. Danise Mina.  Leg pain Most likely due to venous insufficiency and spider veins. Only notable finding on exam is at the right dorsalis pedis pulse is faint; remainder of the pedal pulses are normal. I will defer ABIs for the time being, though we may need to consider this in the future if her symptoms persist.  Follow-up: Return to clinic in 3 months.  Nelva Bush, MD 04/17/2017 9:10 AM

## 2017-04-17 NOTE — Telephone Encounter (Signed)
Sent in.plz notify pt.  

## 2017-04-17 NOTE — Telephone Encounter (Signed)
Error

## 2017-04-18 LAB — MAGNESIUM: Magnesium: 2.2 mg/dL (ref 1.6–2.3)

## 2017-04-18 LAB — TSH: TSH: 2.42 u[IU]/mL (ref 0.450–4.500)

## 2017-04-23 ENCOUNTER — Other Ambulatory Visit: Payer: Self-pay | Admitting: Internal Medicine

## 2017-04-23 DIAGNOSIS — I491 Atrial premature depolarization: Secondary | ICD-10-CM

## 2017-04-29 ENCOUNTER — Ambulatory Visit (INDEPENDENT_AMBULATORY_CARE_PROVIDER_SITE_OTHER): Payer: Medicare HMO

## 2017-04-29 ENCOUNTER — Other Ambulatory Visit: Payer: Self-pay

## 2017-04-29 DIAGNOSIS — I491 Atrial premature depolarization: Secondary | ICD-10-CM | POA: Diagnosis not present

## 2017-04-29 DIAGNOSIS — I10 Essential (primary) hypertension: Secondary | ICD-10-CM

## 2017-04-30 ENCOUNTER — Ambulatory Visit: Payer: Medicare HMO | Admitting: Cardiovascular Disease

## 2017-04-30 ENCOUNTER — Telehealth: Payer: Self-pay | Admitting: Internal Medicine

## 2017-04-30 NOTE — Telephone Encounter (Signed)
Patient returning call re: lab results

## 2017-04-30 NOTE — Telephone Encounter (Signed)
Patient verbalized understanding of echo results and plan of care.

## 2017-05-08 ENCOUNTER — Telehealth: Payer: Self-pay | Admitting: Internal Medicine

## 2017-05-08 ENCOUNTER — Ambulatory Visit
Admission: RE | Admit: 2017-05-08 | Discharge: 2017-05-08 | Disposition: A | Discharge status: Home / Self Care | Payer: Medicare HMO | Source: Ambulatory Visit | Attending: Internal Medicine | Admitting: Internal Medicine

## 2017-05-08 DIAGNOSIS — I10 Essential (primary) hypertension: Secondary | ICD-10-CM | POA: Insufficient documentation

## 2017-05-08 DIAGNOSIS — I491 Atrial premature depolarization: Secondary | ICD-10-CM | POA: Diagnosis not present

## 2017-05-08 NOTE — Telephone Encounter (Signed)
S/w pt. She remembers Ann White calling w/echo results but would like HM results. She understands that results are pending review by Dr. Saunders Revel and will call her as soon as they are ready. She is appreciative of the information with no further questions at this time.

## 2017-05-08 NOTE — Telephone Encounter (Signed)
PT calling for results from monitor and echo Please call when ready If unable to reach at home, please call mobile [phone]

## 2017-05-10 MED ORDER — METOPROLOL SUCCINATE ER 25 MG PO TB24: 12.5000 mg | ORAL_TABLET | Freq: Every day | ORAL | 3 refills | Status: DC

## 2017-05-10 NOTE — Addendum Note (Signed)
Addended by: Vanessa Ralphs on: 05/10/2017 03:03 PM   Modules accepted: Orders

## 2017-05-10 NOTE — Telephone Encounter (Signed)
Called patient and gave her results of holter monitor. She verbalized understanding. Rx for metoprolol sent in to pharmacy. Appt scheduled.

## 2017-05-10 NOTE — Telephone Encounter (Signed)
Patient very anxious for results Heart Monitor results as she is leaving to go out of town for all next week  Please call

## 2017-05-20 ENCOUNTER — Other Ambulatory Visit: Payer: Self-pay | Admitting: Family Medicine

## 2017-05-20 NOTE — Telephone Encounter (Signed)
Last filled 04/25/17, #60 Last OV (CPE):  03/05/17 Next OV:  03/13/18

## 2017-05-21 NOTE — Telephone Encounter (Signed)
Please call in.  Thanks.   

## 2017-05-22 NOTE — Telephone Encounter (Signed)
Refill left on vm at pharmacy per Dr. Duncan.  

## 2017-06-05 ENCOUNTER — Encounter: Payer: Self-pay | Admitting: Nurse Practitioner

## 2017-06-05 ENCOUNTER — Ambulatory Visit (INDEPENDENT_AMBULATORY_CARE_PROVIDER_SITE_OTHER): Payer: Medicare HMO | Admitting: Nurse Practitioner

## 2017-06-05 VITALS — BP 128/68 | HR 53 | Ht 63.0 in | Wt 168.8 lb

## 2017-06-05 DIAGNOSIS — E782 Mixed hyperlipidemia: Secondary | ICD-10-CM

## 2017-06-05 DIAGNOSIS — I10 Essential (primary) hypertension: Secondary | ICD-10-CM | POA: Diagnosis not present

## 2017-06-05 DIAGNOSIS — I491 Atrial premature depolarization: Secondary | ICD-10-CM | POA: Diagnosis not present

## 2017-06-05 DIAGNOSIS — I420 Dilated cardiomyopathy: Secondary | ICD-10-CM | POA: Diagnosis not present

## 2017-06-05 MED ORDER — LISINOPRIL 2.5 MG PO TABS: 2.5000 mg | ORAL_TABLET | Freq: Every day | ORAL | 1 refills | Status: DC

## 2017-06-05 NOTE — Progress Notes (Signed)
Office Visit    Patient Name: Ann White Date of Encounter: 06/05/2017  Primary Care Provider:  Ria Bush, MD Primary Cardiologist:  Andree Coss, MD   Chief Complaint    71 y/o ? with a h/o hypertension, hyperlipidemia, GERD, bilateral nonobstructive carotid stenosis, and recent finding of frequent PACs and cardiomyopathy, who presents for follow-up.  Past Medical History    Past Medical History:  Diagnosis Date  . Arthritis   . Cardiomyopathy ([place])    a. 04/2017 Echo: EF 35-40%, diff HK, mild MR, mildly dil LA, nl RV fxn.  . Carotid stenosis    a. Bilateral 40-59%, rpt 1 yr (02/2016); b. 03/2017 Carotid U/S: RICA 82-50%, LICA 5-39%, JQBH>41% - overall stable.  . Cervical spondylosis    s/p spine injections  . Depression   . Ectatic abdominal aorta ([place]) 2016   rpt [place] 5 yrs  . GERD (gastroesophageal reflux disease)   . History of chicken pox   . History of diverticulitis of colon   . HLD (hyperlipidemia)   . HTN (hypertension)   . Irritable bowel syndrome with constipation   . Lichen sclerosus et atrophicus   . Obesity   . Premature atrial contraction    a. 04/2017 48hr Holter: Predominant rhythm - sinus. Rare PVC's, freq PAC's with freq atrial runs up to 34 beats-->metoprolol started.  . Seasonal allergic rhinitis    Past Surgical History:  Procedure Laterality Date  . bladder tack  1990s  . BREAST BIOPSY Right 03/05/2012    benign  . CARPAL TUNNEL RELEASE  2004, 2013   bilateral  . CHOLECYSTECTOMY  1990s  . COLONOSCOPY  2002  . COLONOSCOPY  06/2013   mod diverticulosis, 3 tubular adenomas, int hem rpt 5 yrs [facility])  . DEXA  07/2011   normal, T score -0.3  . ESOPHAGOGASTRODUODENOSCOPY ENDOSCOPY  2004   normal [facility])  . TOTAL ABDOMINAL HYSTERECTOMY W/ BILATERAL SALPINGOOPHORECTOMY  1990s   complete, dysmenorrhea and fibroids    Allergies  Allergies  Allergen Reactions  . Sulfa Antibiotics Swelling    Causes mouth to swell  . Tricor [Fenofibrate]  Other (See Comments)    myalgias  . Zetia [Ezetimibe] Other (See Comments)    Dizziness  . Contrast Media [Iodinated Diagnostic Agents] Other (See Comments)    Oral contrast caused mouth blisters  . Lipitor [Atorvastatin] Other (See Comments)    myalgias  . Pravastatin Other (See Comments)    myalgias    History of Present Illness    71 year old ? with the above past medical history including hypertension, hyperlipidemia, GERD, bilateral nonobstructive carotid arterial disease, and depression.  She is intolerant to statins.  She was recently evaluated in clinic in October in the setting of finding of irregular heartbeat on examination with PACs.  48-hour Holter was placed and showed PACs with up to 34 beats of atrial tachycardia.  Echocardiogram was also performed and showed LV dysfunction, with an EF of 35-40%.  She was contacted and placed on Toprol-XL 12.5 mg daily.  Since her last visit, she has felt well.  She is tolerating metoprolol well.  She denies chest pain, palpitations, presyncope, or syncope.  She says that she is active and sometimes notes fatigue after being active for long periods.  She does have some dyspnea on exertion with inclines.  She denies PND, orthopnea, edema, or early satiety.  Home Medications    Prior to Admission medications   Medication Sig Start Date End Date Taking?  Authorizing Provider  Acetaminophen (ARTHRITIS PAIN RELIEF PO) Take by mouth.   Yes [provider]  Cholecalciferol (VITAMIN D3) 1000 UNITS CAPS Take 1 capsule (1,000 Units total) by mouth daily. 03/17/15  Yes Ria Bush, MD  clonazePAM (KLONOPIN) 1 MG tablet TAKE 1 TABLET BY MOUTH TWICE DAILY AS NEEDED 05/21/17  Yes Tonia Ghent, MD  Docusate Calcium (STOOL SOFTENER PO) Take by mouth daily.    Yes [provider]  gabapentin (NEURONTIN) 300 MG capsule TAKE 1 CAPSULE BY MOUTH EVERY MORNING AND 2 CAPSULES BY MOUTH EVERY NIGHT AT BEDTIME 04/17/17  Yes Ria Bush, MD    MAGNESIUM CITRATE PO Take by mouth.   Yes [provider]  metoprolol succinate (TOPROL XL) 25 MG 24 hr tablet Take 0.5 tablets (12.5 mg total) by mouth daily. 05/10/17  Yes End, Harrell Gave, MD  Misc Natural Products (SINUS FORMULA PO) Take by mouth.   Yes [provider]  Multiple Vitamin (MULTIVITAMIN) tablet Take 1 tablet by mouth daily.   Yes [provider]  Omega-3 Fatty Acids (FISH OIL) 1000 MG CAPS Take one by mouth two times a day   Yes [provider]  pantoprazole (PROTONIX) 40 MG tablet TAKE 1 TABLET BY MOUTH ONCE DAILY 03/11/17  Yes Ria Bush, MD  sertraline (ZOLOFT) 100 MG tablet TAKE 1 TABLET BY MOUTH ONCE DAILY 03/11/17  Yes Ria Bush, MD  traMADol (ULTRAM) 50 MG tablet TAKE 1 TABLET BY MOUTH THREE TIMES DAILY AS NEEDED 03/29/17  Yes Ria Bush, MD  vitamin B-12 (CYANOCOBALAMIN) 1000 MCG tablet Take 1,000 mcg by mouth daily.   Yes [provider]    Review of Systems    Some amount of dyspnea on exertion with inclines.  She also has fatigue after prolonged periods of activity.  She denies chest pain, palpitations, PND, orthopnea, dizziness, syncope, edema, or early satiety.  All other systems reviewed and are otherwise negative except as noted above.  Physical Exam    VS:  BP 128/68 (BP Location: Left Arm, Patient Position: Sitting, Cuff Size: Large)   Pulse (!) 53   Ht 5\' 3"  (1.6 m)   Wt 168 lb 12 oz (76.5 kg)   BMI 29.89 kg/m  , BMI Body mass index is 29.89 kg/m. GEN: Well nourished, well developed, in no acute distress.  HEENT: normal.  Neck: Supple, no JVD, carotid bruits, or masses. Cardiac: Irregular, no murmurs, rubs, or gallops. No clubbing, cyanosis, edema.  Radials/DP/PT 2+ and equal bilaterally.  Respiratory:  Respirations regular and unlabored, clear to auscultation bilaterally. GI: Soft, nontender, nondistended, BS + x 4. MS: no deformity or atrophy. Skin: warm and dry, no rash. Neuro:   Strength and sensation are intact. Psych: Normal affect.  Accessory Clinical Findings    ECG -sinus bradycardia, 53, PAC, no acute ST or T changes.  Assessment & Plan    1.  Cardiomyopathy: Question ischemic versus nonischemic.  EF 35-40% with global hypokinesis by recent echocardiogram in the setting of frequent PACs and runs of atrial tachycardia noted on 48-hour Holter monitor.  Patient has some degree of fatigue after higher levels of activity and also some dyspnea on exertion with inclines but denies any prior history of chest pain.  We have discussed workup today and she is willing to proceed with an exercise Myoview, which we will schedule for next week.  She is tolerating low-dose metoprolol therapy well and I will add a low-dose of lisinopril-2.5 mg daily and plan to follow-up a  basic metabolic panel next week.  2.  Premature atrial contractions/atrial tachycardia: Patient noted to have irregular heart beat recently.  Holter showed frequent PACs and runs of atrial tachycardia.  She is now on beta-blocker therapy and is mildly bradycardic today with only 1 PAC on 12-lead.  She is completely asymptomatic.  TSH and magnesium were normal on October 3.  Continue beta-blocker therapy.  3.  Essential hypertension: Stable.  4.  Hyperlipidemia: Statin intolerant.  Previously did not tolerate Zetia or fibrates either.  She is on fish oil.  Would consider discontinuing in setting of data from ASCEND trial earlier this year and potentially switching to Vacepa (REDUCE-IT) given elevated TG, LDL, and carotid dzs.  Defer to PCP.  5.  Dispo:  F/u bmet and MV next week.  F/u in clinic in 1 month or sooner if necessary.  Murray Hodgkins, NP 06/05/2017, 9:02 AM

## 2017-06-05 NOTE — Patient Instructions (Addendum)
Medication Instructions:  Your physician has recommended you make the following change in your medication:  START taking lisinopril 2.5mg  once daily  Labwork: BMET in one week at the [facility] lab  Testing/Procedures: Your physician has requested that you have a lexiscan myoview. For further information please visit [email or web address]. Please follow instruction sheet, as given.  [place]  Your caregiver has ordered a Stress Test with nuclear imaging. The purpose of this test is to evaluate the blood supply to your heart muscle. This procedure is referred to as a "Non-Invasive Stress Test." This is because other than having an IV started in your vein, nothing is inserted or "invades" your body. Cardiac stress tests are done to find areas of poor blood flow to the heart by determining the extent of coronary artery disease (CAD). Some patients exercise on a treadmill, which naturally increases the blood flow to your heart, while others who are  unable to walk on a treadmill due to physical limitations have a pharmacologic/chemical stress agent called Lexiscan . This medicine will mimic walking on a treadmill by temporarily increasing your coronary blood flow.   Please note: these test may take anywhere between 2-4 hours to complete  PLEASE REPORT TO [place] TO GO  Date of Procedure: Wednesday, November 28  Arrival Time for Procedure:  9:15am or earlier for [facility] Instructions regarding medication:    _xx___:  Hold metoprolol the night before procedure   PLEASE NOTIFY THE OFFICE AT LEAST 24 HOURS IN ADVANCE IF YOU ARE UNABLE TO [place].  [phone] AND  PLEASE NOTIFY NUCLEAR MEDICINE AT [facility] AT LEAST 24 HOURS IN ADVANCE IF YOU ARE UNABLE TO KEEP YOUR APPOINTMENT. [phone]  How to prepare for your Myoview test:  1. Do not eat or drink after midnight 2. No caffeine for 24 hours prior  to test 3. No smoking 24 hours prior to test. 4. Your medication may be taken with water.  If your doctor stopped a medication because of this test, do not take that medication. 5. Ladies, please do not wear dresses.  Skirts or pants are appropriate. Please wear a short sleeve shirt. 6. No perfume, cologne or lotion. 7. Wear comfortable walking shoes. No heels!            Follow-Up: Your physician recommends that you schedule a follow-up appointment in: 1 month with Dr. Saunders Revel or Ignacia Bayley, NP   Any Other Special Instructions Will Be Listed Below (If Applicable).     If you need a refill on your cardiac medications before your next appointment, please call your pharmacy.  Cardiac Nuclear Scan A cardiac nuclear scan is a test that measures blood flow to the heart when a person is resting and when he or she is exercising. The test looks for problems such as:  Not enough blood reaching a portion of the heart.  The heart muscle not working normally.  You may need this test if:  You have heart disease.  You have had abnormal lab results.  You have had heart surgery or angioplasty.  You have chest pain.  You have shortness of breath.  In this test, a radioactive dye (tracer) is injected into your bloodstream. After the tracer has traveled to your heart, an imaging device is used to measure how much of the tracer is absorbed by or distributed to various areas of your heart. This procedure is usually done  at a hospital and takes 2-4 hours. Tell a health care provider about:  Any allergies you have.  All medicines you are taking, including vitamins, herbs, eye drops, creams, and over-the-counter medicines.  Any problems you or family members have had with the use of anesthetic medicines.  Any blood disorders you have.  Any surgeries you have had.  Any medical conditions you have.  Whether you are pregnant or may be pregnant. What are the risks? Generally, this is a  safe procedure. However, problems may occur, including:  Serious chest pain and heart attack. This is only a risk if the stress portion of the test is done.  Rapid heartbeat.  Sensation of warmth in your chest. This usually passes quickly.  What happens before the procedure?  Ask your health care provider about changing or stopping your regular medicines. This is especially important if you are taking diabetes medicines or blood thinners.  Remove your jewelry on the day of the procedure. What happens during the procedure?  An IV tube will be inserted into one of your veins.  Your health care provider will inject a small amount of radioactive tracer through the tube.  You will wait for 20-40 minutes while the tracer travels through your bloodstream.  Your heart activity will be monitored with an electrocardiogram (ECG).  You will lie down on an exam table.  Images of your heart will be taken for about 15-20 minutes.  You may be asked to exercise on a treadmill or stationary bike. While you exercise, your heart's activity will be monitored with an ECG, and your blood pressure will be checked. If you are unable to exercise, you may be given a medicine to increase blood flow to parts of your heart.  When blood flow to your heart has peaked, a tracer will again be injected through the IV tube.  After 20-40 minutes, you will get back on the exam table and have more images taken of your heart.  When the procedure is over, your IV tube will be removed. The procedure may vary among health care providers and hospitals. Depending on the type of tracer used, scans may need to be repeated 3-4 hours later. What happens after the procedure?  Unless your health care provider tells you otherwise, you may return to your normal schedule, including diet, activities, and medicines.  Unless your health care provider tells you otherwise, you may increase your fluid intake. This will help flush the  contrast dye from your body. Drink enough fluid to keep your urine clear or pale yellow.  It is up to you to get your test results. Ask your health care provider, or the department that is doing the test, when your results will be ready. Summary  A cardiac nuclear scan measures the blood flow to the heart when a person is resting and when he or she is exercising.  You may need this test if you are at risk for heart disease.  Tell your health care provider if you are pregnant.  Unless your health care provider tells you otherwise, increase your fluid intake. This will help flush the contrast dye from your body. Drink enough fluid to keep your urine clear or pale yellow. This information is not intended to replace advice given to you by your health care provider. Make sure you discuss any questions you have with your health care provider. Document Released: 07/27/2004 Document Revised: 07/04/2016 Document Reviewed: 06/10/2013 Elsevier Interactive Patient Education  2017 [employer].

## 2017-06-12 ENCOUNTER — Encounter
Admission: RE | Admit: 2017-06-12 | Discharge: 2017-06-12 | Disposition: A | Discharge status: Home / Self Care | Payer: Medicare HMO | Source: Ambulatory Visit | Attending: Nurse Practitioner | Admitting: Nurse Practitioner

## 2017-06-12 ENCOUNTER — Other Ambulatory Visit
Admission: RE | Admit: 2017-06-12 | Discharge: 2017-06-12 | Disposition: A | Discharge status: Home / Self Care | Payer: Medicare HMO | Source: Ambulatory Visit | Attending: Nurse Practitioner | Admitting: Nurse Practitioner

## 2017-06-12 DIAGNOSIS — I10 Essential (primary) hypertension: Secondary | ICD-10-CM

## 2017-06-12 DIAGNOSIS — R0600 Dyspnea, unspecified: Secondary | ICD-10-CM

## 2017-06-12 DIAGNOSIS — R0609 Other forms of dyspnea: Secondary | ICD-10-CM | POA: Insufficient documentation

## 2017-06-12 LAB — NM MYOCAR MULTI W/SPECT W/WALL MOTION / EF
Estimated workload: 7 METS
Exercise duration (min): 6 min
Exercise duration (sec): 40 s
LV dias vol: 91 mL (ref 46–106)
LV sys vol: 42 mL
Peak BP: 226 mmHg
Peak HR: 131 {beats}/min
Percent HR: 7 %
Percent of predicted max HR: 87 %
Rest HR: 52 {beats}/min
SDS: 1
SRS: 0
SSS: 0
Stage 1 Grade: 0 %
Stage 1 HR: 57 {beats}/min
Stage 1 Speed: 0 mph
Stage 2 Grade: 0 %
Stage 2 HR: 57 {beats}/min
Stage 2 Speed: 0 mph
Stage 3 DBP: 99 mmHg
Stage 3 Grade: 10 %
Stage 3 HR: 113 {beats}/min
Stage 3 SBP: 172 mmHg
Stage 3 Speed: 1.7 mph
Stage 4 DBP: 95 mmHg
Stage 4 Grade: 12 %
Stage 4 HR: 131 {beats}/min
Stage 4 SBP: 226 mmHg
Stage 4 Speed: 2.5 mph
Stage 5 Grade: 0 %
Stage 5 HR: 117 {beats}/min
Stage 5 Speed: 0 mph
Stage 6 DBP: 83 mmHg
Stage 6 Grade: 0 %
Stage 6 HR: 72 {beats}/min
Stage 6 SBP: 174 mmHg
Stage 6 Speed: 0 mph
TID: 0.95

## 2017-06-12 LAB — BASIC METABOLIC PANEL
Anion gap: 12 (ref 5–15)
BUN: 22 mg/dL — ABNORMAL HIGH (ref 6–20)
CO2: 25 mmol/L (ref 22–32)
Calcium: 9.6 mg/dL (ref 8.9–10.3)
Chloride: 103 mmol/L (ref 101–111)
Creatinine, Ser: 0.88 mg/dL (ref 0.44–1.00)
GFR calc Af Amer: >60 mL/min (ref >60)
GFR calc non Af Amer: >60 mL/min (ref >60)
Glucose, Bld: 90 mg/dL (ref 65–99)
Potassium: 4 mmol/L (ref 3.5–5.1)
Sodium: 140 mmol/L (ref 135–145)

## 2017-06-12 MED ORDER — TECHNETIUM TC 99M TETROFOSMIN IV KIT
31.0200 | PACK | Freq: Once | INTRAVENOUS | Status: AC | PRN
  Administered 2017-06-12: 31.02 via INTRAVENOUS

## 2017-06-12 MED ORDER — TECHNETIUM TC 99M TETROFOSMIN IV KIT
12.1900 | PACK | Freq: Once | INTRAVENOUS | Status: AC | PRN
  Administered 2017-06-12: 12.19 via INTRAVENOUS

## 2017-06-24 ENCOUNTER — Other Ambulatory Visit: Payer: Self-pay | Admitting: Family Medicine

## 2017-06-26 NOTE — Telephone Encounter (Signed)
Ok to ref #[id number], 0 ref

## 2017-06-26 NOTE — Telephone Encounter (Signed)
Rx called to pharmacy as instructed. 

## 2017-07-17 ENCOUNTER — Encounter: Payer: Self-pay | Admitting: Family Medicine

## 2017-07-17 ENCOUNTER — Ambulatory Visit: Payer: Medicare HMO | Admitting: Internal Medicine

## 2017-07-17 ENCOUNTER — Ambulatory Visit (INDEPENDENT_AMBULATORY_CARE_PROVIDER_SITE_OTHER): Payer: Medicare HMO | Admitting: Family Medicine

## 2017-07-17 ENCOUNTER — Encounter: Payer: Self-pay | Admitting: Internal Medicine

## 2017-07-17 VITALS — BP 110/66 | HR 57 | Temp 97.6°F | Wt 167.8 lb

## 2017-07-17 VITALS — BP 118/60 | HR 53 | Ht 64.0 in | Wt 168.2 lb

## 2017-07-17 DIAGNOSIS — I491 Atrial premature depolarization: Secondary | ICD-10-CM | POA: Diagnosis not present

## 2017-07-17 DIAGNOSIS — I428 Other cardiomyopathies: Secondary | ICD-10-CM | POA: Diagnosis not present

## 2017-07-17 DIAGNOSIS — I6523 Occlusion and stenosis of bilateral carotid arteries: Secondary | ICD-10-CM

## 2017-07-17 DIAGNOSIS — J8410 Pulmonary fibrosis, unspecified: Secondary | ICD-10-CM

## 2017-07-17 DIAGNOSIS — N183 Chronic kidney disease, stage 3 unspecified: Secondary | ICD-10-CM

## 2017-07-17 DIAGNOSIS — J22 Unspecified acute lower respiratory infection: Secondary | ICD-10-CM | POA: Insufficient documentation

## 2017-07-17 MED ORDER — AZITHROMYCIN 250 MG PO TABS: ORAL_TABLET | ORAL | 0 refills | Status: DC

## 2017-07-17 MED ORDER — BENZONATATE 100 MG PO CAPS: 100.0000 mg | ORAL_CAPSULE | Freq: Three times a day (TID) | ORAL | 0 refills | Status: DC | PRN

## 2017-07-17 NOTE — Progress Notes (Signed)
Follow-up Outpatient Visit Date: 07/17/2017  Primary Care Provider: Ria Bush, MD [place] [address]  Chief Complaint: Follow-up tachycardia and cardiomyopathy  HPI:  Ms. Noack is a 72 y.o. year-old female with history of nonischemic cardiomyopathy diagnosed in 04/2017 (MPI without perfusion defects), carotid artery stenosis, abdominal aorta ectasia, right calf DVT, hypertension, hyperlipidemia, and chronic kidney disease, who presents for follow-up of cardiomyopathy. I first met Ms. Ivins in early October for evaluation of an irregular heartbeat. EKGs have demonstrated frequent PVCs versus wandering pacemaker. Subsequent echo demonstrated moderately reduced LV contraction. Interestingly, myocardial perfusion stress test in late November showed normal LV contraction. We started her on low-dose metoprolol following her echo and Holter monitor.  Today, Ms. Maillet reports feeling well.  She noticed a little bit of lightheadedness after starting metoprolol and lisinopril, though this has since resolved.  She notes very minimal fatigue but is able to carry on her normal activities.  She denies chest pain, shortness of breath, palpitations, and edema.  --------------------------------------------------------------------------------------------------  Cardiovascular History & Procedures: Cardiovascular Problems:  Frequent PACs versus wandering pacemaker  Nonischemic cardiomyopathy  Carotid artery stenosis  Risk Factors:  Hypertension, hyperlipidemia, and age greater than 52  Cath/PCI:  None  CV Surgery:  None  EP Procedures and Devices:  48 hour Holter monitor (04/29/17): Sinus rhythm with frequent PACs and atrial runs, lasting up to 34 beats.  Non-Invasive Evaluation(s):  Exercise MPI (06/12/17): No evidence of ischemia. LVEF 56%. Hypertensive blood pressure response to exercise.  Echo (04/29/17): Normal LV size with moderately reduced  contraction. LVEF 35-40% with global hypokinesis. Mild MR. Mild left atrial enlargement. Normal RV size and function. Normal PA pressure.  Carotid Doppler (03/25/17): Heterogeneous plaque, bilaterally. Stable, 40-59% RICA stenosis. ZLDJTT,0-17% LICA stenosis. Stable, >50% RECA stenosis. Patent vertebral arteries with antegrade flow. Normal subclavian arteries, bilaterally.  Recent CV Pertinent Labs: Lab Results  Component Value Date   CHOL 255 (H) 02/26/2017   HDL 47.20 02/26/2017   LDLDIRECT 171.0 02/26/2017   TRIG 217.0 (H) 02/26/2017   CHOLHDL 5 02/26/2017   INR 1.5 09/22/2015   INR 7.0 Repeated and verified X2. ([place]) 06/13/2015   K 4.0 06/12/2017   MG 2.2 04/17/2017   BUN 22 (H) 06/12/2017   CREATININE 0.88 06/12/2017   CREATININE 1.19 07/26/2011    Past medical and surgical history were reviewed and updated in EPIC.  Current Meds  Medication Sig  . Acetaminophen (ARTHRITIS PAIN RELIEF PO) Take by mouth.  . Cholecalciferol (VITAMIN D3) 1000 UNITS CAPS Take 1 capsule (1,000 Units total) by mouth daily.  . clonazePAM (KLONOPIN) 1 MG tablet TAKE 1 TABLET BY MOUTH TWICE DAILY AS NEEDED  . Docusate Calcium (STOOL SOFTENER PO) Take by mouth daily.   Marland Kitchen gabapentin (NEURONTIN) 300 MG capsule TAKE 1 CAPSULE BY MOUTH EVERY MORNING AND 2 CAPSULES BY MOUTH EVERY NIGHT AT BEDTIME  . lisinopril (PRINIVIL,ZESTRIL) 2.5 MG tablet Take 1 tablet (2.5 mg total) by mouth daily.  Marland Kitchen MAGNESIUM CITRATE PO Take by mouth.  . metoprolol succinate (TOPROL XL) 25 MG 24 hr tablet Take 0.5 tablets (12.5 mg total) by mouth daily.  . Misc Natural Products (SINUS FORMULA PO) Take by mouth.  . Multiple Vitamin (MULTIVITAMIN) tablet Take 1 tablet by mouth daily.  . Omega-3 Fatty Acids (FISH OIL) 1000 MG CAPS Take one by mouth two times a day  . pantoprazole (PROTONIX) 40 MG tablet TAKE 1 TABLET BY MOUTH ONCE DAILY  . sertraline (ZOLOFT) 100  MG tablet TAKE 1 TABLET BY MOUTH ONCE DAILY  . traMADol (ULTRAM) 50 MG  tablet TAKE 1 TABLET BY MOUTH THREE TIMES DAILY AS NEEDED  . vitamin B-12 (CYANOCOBALAMIN) 1000 MCG tablet Take 1,000 mcg by mouth daily.    Allergies: Sulfa antibiotics; Tricor [fenofibrate]; Zetia [ezetimibe]; Contrast media [iodinated diagnostic agents]; Lipitor [atorvastatin]; and Pravastatin  Social History   Socioeconomic History  . Marital status: Married    Spouse name: Not on file  . Number of children: Not on file  . Years of education: Not on file  . Highest education level: Not on file  Social Needs  . Financial resource strain: Not on file  . Food insecurity - worry: Not on file  . Food insecurity - inability: Not on file  . Transportation needs - medical: Not on file  . Transportation needs - non-medical: Not on file  Occupational History  . Not on file  Tobacco Use  . Smoking status: Former Smoker    Packs/day: 0.25    Years: 3.00    Pack years: 0.75    Types: Cigarettes    Last attempt to quit: 1990    Years since quitting: 29.0  . Smokeless tobacco: Never Used  . Tobacco comment: minimal smoking history  Substance and Sexual Activity  . Alcohol use: No    Alcohol/week: 0.0 oz  . Drug use: No  . Sexual activity: No  Other Topics Concern  . Not on file  Social History Narrative   Lives with husband and 2 cats.  2 grown children, 5 grandchildren.   Occupation: retired, worked in [employer]   Activity: no regular exercise.  Does walk in summer   Diet: fruits/vegetables daily, good water.    Family History  Problem Relation Age of Onset  . CAD Mother 41       MI  . Hypertension Mother   . Parkinson's disease Father   . Diabetes Father   . CAD Father 43       MI  . Hyperlipidemia Sister   . Cancer Neg Hx   . Stroke Neg Hx   . Colon cancer Neg Hx     Review of Systems: A 12-system review of systems was performed and was negative except as noted in the  HPI.  --------------------------------------------------------------------------------------------------  Physical Exam: BP 118/60 (BP Location: Left Arm, Patient Position: Sitting, Cuff Size: Normal)   Pulse (!) 53   Ht 5' 4"  (1.626 m)   Wt 168 lb 4 oz (76.3 kg)   BMI 28.88 kg/m   General: Overweight woman, seated comfortably in the exam room. HEENT: No conjunctival pallor or scleral icterus. Moist mucous membranes.  OP clear. Neck: Supple without lymphadenopathy, thyromegaly, JVD, or HJR.  Coarse breath sounds bilaterally without wheezes or crackles. Lungs: Normal work of breathing. Clear to auscultation bilaterally without wheezes or crackles. Heart: Regular rate and rhythm without murmurs, rubs, or gallops. Non-displaced PMI. Abd: Bowel sounds present. Soft, NT/ND without hepatosplenomegaly Ext: No lower extremity edema. Radial, PT, and DP pulses are 2+ bilaterally. Skin: Warm and dry without rash.  EKG: Sinus bradycardia (heart rate 53 bpm) with nonspecific T wave changes.  No ectopy noted.  Lab Results  Component Value Date   WBC 5.0 02/26/2017   HGB 11.3 (L) 02/26/2017   HCT 33.5 (L) 02/26/2017   MCV 90.4 02/26/2017   PLT 175.0 02/26/2017    Lab Results  Component Value Date   NA 140 06/12/2017   K 4.0 06/12/2017  CL 103 06/12/2017   CO2 25 06/12/2017   BUN 22 (H) 06/12/2017   CREATININE 0.88 06/12/2017   GLUCOSE 90 06/12/2017   ALT 17 05/16/2015    Lab Results  Component Value Date   CHOL 255 (H) 02/26/2017   HDL 47.20 02/26/2017   LDLDIRECT 171.0 02/26/2017   TRIG 217.0 (H) 02/26/2017   CHOLHDL 5 02/26/2017    --------------------------------------------------------------------------------------------------  ASSESSMENT AND PLAN: Frequent PACs Previous EKGs have demonstrated frequent PACs versus wandering pacemaker.  With addition of low-dose metoprolol, this has resolved.  Ms. Martinek notes minimal fatigue with addition of metoprolol, though not  lifestyle limiting.  Heart rate today is 53 bpm.  We have agreed to continue her current medications.  No further testing or intervention at this time.  Nonischemic cardiomyopathy Echocardiogram in October was notable for moderately reduced left ventricular contraction.  I have personally reviewed the images and suspect that frequent ectopy may have led to underestimation of LVEF.  Subsequent myocardial perfusion stress test was without ischemia or scar and showed normal LVEF.  We have agreed to continue with low-dose metoprolol and lisinopril.  We will plan to repeat a limited echo when Ms. Galeno returns for follow-up in 6 months.  Carotid artery stenosis Mild to moderate disease noted bilaterally on prior Dopplers, as recently as 03/2017.  No symptoms reported by the patient.  Continue medical therapy; patient has intolerances to statins and ezetimibe.  Suggest repeat carotid Dopplers in 03/2018.  I will defer this to Dr. Danise Mina.  Follow-up: Return to clinic in 6 months.  Nelva Bush, MD 07/17/2017 4:29 PM

## 2017-07-17 NOTE — Patient Instructions (Addendum)
I think you have bronchitis.  I will refill tessalon perls. I do want you to start robitussin or delsym cough syrup over the counter to help suppress cough, mainly at night time.  Continue tylenol as needed for discomfort. zpack antibiotic prescription printed out today - fill if fever >101, or worsening productive cough. Otherwise may be viral bronchitis that will improve over next few days.

## 2017-07-17 NOTE — Progress Notes (Signed)
BP 110/66 (BP Location: Right Arm, Patient Position: Sitting, Cuff Size: Normal)   Pulse (!) 57   Temp 97.6 F (36.4 C) (Oral)   Wt 167 lb 12 oz (76.1 kg)   SpO2 97%   BMI 28.79 kg/m    CC: sinus problems Subjective:    Patient ID: Ann White, female    DOB: 12/18/1945, 72 y.o.   MRN: [id number]  HPI: Ann White is a 72 y.o. female presenting on 07/17/2017 for Sinus Problem (X1week )   1 wk h/o sinus pressure headache, hoarseness, non productive cough. Head and chest congestion. ST with PNDrainage. Has felt feverish. General malaise and fatigue.   No chills, ear or tooth pain, dyspnea or wheezing. No body aches.   Has tried tessalon perls for cough.  Husband sick recently.  No h/o asthma. Ex smoker (remotely)  Relevant past medical, surgical, family and social history reviewed and updated as indicated. Interim medical history since our last visit reviewed. Allergies and medications reviewed and updated. Outpatient Medications Prior to Visit  Medication Sig Dispense Refill  . Acetaminophen (ARTHRITIS PAIN RELIEF PO) Take by mouth.    . Cholecalciferol (VITAMIN D3) 1000 UNITS CAPS Take 1 capsule (1,000 Units total) by mouth daily.    . clonazePAM (KLONOPIN) 1 MG tablet TAKE 1 TABLET BY MOUTH TWICE DAILY AS NEEDED 60 tablet 0  . Docusate Calcium (STOOL SOFTENER PO) Take by mouth daily.     Marland Kitchen gabapentin (NEURONTIN) 300 MG capsule TAKE 1 CAPSULE BY MOUTH EVERY MORNING AND 2 CAPSULES BY MOUTH EVERY NIGHT AT BEDTIME 270 capsule 2  . lisinopril (PRINIVIL,ZESTRIL) 2.5 MG tablet Take 1 tablet (2.5 mg total) by mouth daily. 90 tablet 1  . MAGNESIUM CITRATE PO Take by mouth.    . metoprolol succinate (TOPROL XL) 25 MG 24 hr tablet Take 0.5 tablets (12.5 mg total) by mouth daily. 45 tablet 3  . Misc Natural Products (SINUS FORMULA PO) Take by mouth.    . Multiple Vitamin (MULTIVITAMIN) tablet Take 1 tablet by mouth daily.    . Omega-3 Fatty Acids (FISH OIL) 1000 MG CAPS Take one by  mouth two times a day    . pantoprazole (PROTONIX) 40 MG tablet TAKE 1 TABLET BY MOUTH ONCE DAILY 90 tablet 2  . sertraline (ZOLOFT) 100 MG tablet TAKE 1 TABLET BY MOUTH ONCE DAILY 90 tablet 2  . traMADol (ULTRAM) 50 MG tablet TAKE 1 TABLET BY MOUTH THREE TIMES DAILY AS NEEDED 120 tablet 0  . vitamin B-12 (CYANOCOBALAMIN) 1000 MCG tablet Take 1,000 mcg by mouth daily.     No facility-administered medications prior to visit.      Per HPI unless specifically indicated in ROS section below Review of Systems     Objective:    BP 110/66 (BP Location: Right Arm, Patient Position: Sitting, Cuff Size: Normal)   Pulse (!) 57   Temp 97.6 F (36.4 C) (Oral)   Wt 167 lb 12 oz (76.1 kg)   SpO2 97%   BMI 28.79 kg/m   Wt Readings from Last 3 Encounters:  07/17/17 167 lb 12 oz (76.1 kg)  07/17/17 168 lb 4 oz (76.3 kg)  06/05/17 168 lb 12 oz (76.5 kg)    Physical Exam  Constitutional: She appears well-developed and well-nourished. No distress.  HENT:  Head: Normocephalic and atraumatic.  Right Ear: Hearing, tympanic membrane, external ear and ear canal normal.  Left Ear: Hearing, external ear and ear canal normal.  Nose: Mucosal  edema (nasal mucosal pallor) present. No rhinorrhea. Right sinus exhibits maxillary sinus tenderness. Right sinus exhibits no frontal sinus tenderness. Left sinus exhibits maxillary sinus tenderness. Left sinus exhibits no frontal sinus tenderness.  Mouth/Throat: Uvula is midline, oropharynx is clear and moist and mucous membranes are normal. No oropharyngeal exudate, posterior oropharyngeal edema, posterior oropharyngeal erythema or tonsillar abscesses.  Scarring of L TM  Eyes: Conjunctivae and EOM are normal. Pupils are equal, round, and reactive to light. No scleral icterus.  Neck: Normal range of motion. Neck supple.  Cardiovascular: Normal rate, regular rhythm, normal heart sounds and intact distal pulses.  No murmur heard. Pulmonary/Chest: Effort normal and  breath sounds normal. No respiratory distress. She has no wheezes. She has no rales.  Lungs overall clear  Lymphadenopathy:    She has no cervical adenopathy.  Skin: Skin is warm and dry. No rash noted.  Nursing note and vitals reviewed.  Lab Results  Component Value Date   CREATININE 0.88 06/12/2017      Assessment & Plan:   Problem List Items Addressed This Visit    Acute respiratory infection - Primary    Anticipate viral bronchitis and laryngitis. Supportive care reviewed including fluids, rest, will Rx tessalon perls.  WASP for zpack provided today with indications when to fill.  Update if not improving with treatment.       Relevant Medications   azithromycin (ZITHROMAX) 250 MG tablet   CKD (chronic kidney disease) stage 3, GFR 30-59 ml/min (HCC)   Nonischemic cardiomyopathy (HCC)   PULMONARY FIBROSIS, POSTINFLAMMATORY    Carries this diagnosis.  CXR 06/2016 was normal.           Follow up plan: No Follow-up on file.  Ria Bush, MD

## 2017-07-17 NOTE — Patient Instructions (Signed)
Medication Instructions:  Your physician recommends that you continue on your current medications as directed. Please refer to the Current Medication list given to you today.   Labwork: none  Testing/Procedures: Your physician has requested that you have an LIMITED echocardiogram IN 6 MONTHS ON SAME DAY AS appointment WITH DR END. Echocardiography is a painless test that uses sound waves to create images of your heart. It provides your doctor with information about the size and shape of your heart and how well your heart's chambers and valves are working. This procedure takes approximately one hour. There are no restrictions for this procedure.    Follow-Up: Your physician wants you to follow-up in: 6 MONTHS WITH DR END WITH LIMITED ECHO ON THE SAME DAY OF appointment. You will receive a reminder letter in the mail two months in advance. If you don't receive a letter, please call our office to schedule the follow-up appointment.   If you need a refill on your cardiac medications before your next appointment, please call your pharmacy.     Echocardiogram An echocardiogram, or echocardiography, uses sound waves (ultrasound) to produce an image of your heart. The echocardiogram is simple, painless, obtained within a short period of time, and offers valuable information to your health care provider. The images from an echocardiogram can provide information such as:  Evidence of coronary artery disease (CAD).  Heart size.  Heart muscle function.  Heart valve function.  Aneurysm detection.  Evidence of a past heart attack.  Fluid buildup around the heart.  Heart muscle thickening.  Assess heart valve function.  Tell a health care provider about:  Any allergies you have.  All medicines you are taking, including vitamins, herbs, eye drops, creams, and over-the-counter medicines.  Any problems you or family members have had with anesthetic medicines.  Any blood disorders you  have.  Any surgeries you have had.  Any medical conditions you have.  Whether you are pregnant or may be pregnant. What happens before the procedure? No special preparation is needed. Eat and drink normally. What happens during the procedure?  In order to produce an image of your heart, gel will be applied to your chest and a wand-like tool (transducer) will be moved over your chest. The gel will help transmit the sound waves from the transducer. The sound waves will harmlessly bounce off your heart to allow the heart images to be captured in real-time motion. These images will then be recorded.  You may need an IV to receive a medicine that improves the quality of the pictures. What happens after the procedure? You may return to your normal schedule including diet, activities, and medicines, unless your health care provider tells you otherwise. This information is not intended to replace advice given to you by your health care provider. Make sure you discuss any questions you have with your health care provider. Document Released: 06/29/2000 Document Revised: 02/18/2016 Document Reviewed: 03/09/2013 Elsevier Interactive Patient Education  2017 [employer].

## 2017-07-17 NOTE — Assessment & Plan Note (Addendum)
Anticipate viral bronchitis and laryngitis. Supportive care reviewed including fluids, rest, will Rx tessalon perls.  WASP for zpack provided today with indications when to fill.  Update if not improving with treatment.

## 2017-07-17 NOTE — Assessment & Plan Note (Addendum)
Carries this diagnosis.  CXR 06/2016 was normal.

## 2017-07-23 ENCOUNTER — Other Ambulatory Visit: Payer: Self-pay | Admitting: Family Medicine

## 2017-07-23 NOTE — Telephone Encounter (Signed)
Last office visit 07/17/2017.  Last refilled 06/26/2017 for #60 with no refills.  Ok to refill?

## 2017-07-23 NOTE — Telephone Encounter (Signed)
Sent electronically 

## 2017-08-02 ENCOUNTER — Other Ambulatory Visit: Payer: Self-pay | Admitting: *Deleted

## 2017-08-02 DIAGNOSIS — I6523 Occlusion and stenosis of bilateral carotid arteries: Secondary | ICD-10-CM

## 2017-08-25 ENCOUNTER — Other Ambulatory Visit: Payer: Self-pay | Admitting: Family Medicine

## 2017-08-26 NOTE — Telephone Encounter (Signed)
Last filled:  07/26/17, #60 Last OV:  07/17/17 Next OV:  03/13/18

## 2017-08-26 NOTE — Telephone Encounter (Signed)
Eprescribed.

## 2017-09-16 ENCOUNTER — Other Ambulatory Visit: Payer: Self-pay | Admitting: Family Medicine

## 2017-09-16 DIAGNOSIS — Z1231 Encounter for screening mammogram for malignant neoplasm of breast: Secondary | ICD-10-CM

## 2017-09-23 ENCOUNTER — Other Ambulatory Visit: Payer: Self-pay | Admitting: Family Medicine

## 2017-09-23 NOTE — Telephone Encounter (Signed)
Last filled:  08/27/17, #60 Last OV:  07/17/17 Next OV:  03/13/18

## 2017-09-24 NOTE — Telephone Encounter (Signed)
Eprescribed.

## 2017-10-18 ENCOUNTER — Other Ambulatory Visit: Payer: Self-pay | Admitting: Family Medicine

## 2017-10-18 NOTE — Telephone Encounter (Signed)
Eprescribed.

## 2017-10-18 NOTE — Telephone Encounter (Signed)
Last filled:  04/05/17, #120 Last OV:  07/17/17 Next OV (CPE):  03/13/18

## 2017-10-21 ENCOUNTER — Other Ambulatory Visit: Payer: Self-pay | Admitting: Family Medicine

## 2017-10-23 ENCOUNTER — Ambulatory Visit
Admission: RE | Admit: 2017-10-23 | Discharge: 2017-10-23 | Disposition: A | Discharge status: Home / Self Care | Payer: Medicare HMO | Source: Ambulatory Visit | Attending: Family Medicine | Admitting: Family Medicine

## 2017-10-23 DIAGNOSIS — Z1231 Encounter for screening mammogram for malignant neoplasm of breast: Secondary | ICD-10-CM

## 2017-10-23 LAB — HM MAMMOGRAPHY

## 2017-10-24 ENCOUNTER — Encounter: Payer: Self-pay | Admitting: Family Medicine

## 2017-11-20 ENCOUNTER — Encounter: Payer: Self-pay | Admitting: Family Medicine

## 2017-11-20 ENCOUNTER — Other Ambulatory Visit: Payer: Self-pay

## 2017-11-20 ENCOUNTER — Encounter

## 2017-11-20 ENCOUNTER — Ambulatory Visit (INDEPENDENT_AMBULATORY_CARE_PROVIDER_SITE_OTHER): Payer: Medicare HMO | Admitting: Family Medicine

## 2017-11-20 VITALS — BP 120/60 | HR 45 | Temp 98.3°F | Ht 64.0 in | Wt 169.5 lb

## 2017-11-20 DIAGNOSIS — R300 Dysuria: Secondary | ICD-10-CM

## 2017-11-20 DIAGNOSIS — N390 Urinary tract infection, site not specified: Secondary | ICD-10-CM | POA: Insufficient documentation

## 2017-11-20 DIAGNOSIS — N3000 Acute cystitis without hematuria: Secondary | ICD-10-CM

## 2017-11-20 LAB — POC URINALSYSI DIPSTICK (AUTOMATED)
Bilirubin, UA: NEGATIVE
Blood, UA: NEGATIVE
Glucose, UA: NEGATIVE
Ketones, UA: NEGATIVE
Nitrite, UA: NEGATIVE
Protein, UA: NEGATIVE
Spec Grav, UA: 1.02 (ref 1.010–1.025)
Urobilinogen, UA: 0.2 E.U./dL
pH, UA: 6 (ref 5.0–8.0)

## 2017-11-20 MED ORDER — CEPHALEXIN 250 MG PO CAPS: 250.0000 mg | ORAL_CAPSULE | Freq: Two times a day (BID) | ORAL | 0 refills | Status: AC

## 2017-11-20 MED ORDER — PHENAZOPYRIDINE HCL 200 MG PO TABS: 200.0000 mg | ORAL_TABLET | Freq: Three times a day (TID) | ORAL | 0 refills | Status: DC | PRN

## 2017-11-20 NOTE — Patient Instructions (Signed)
Drink lots of water  If your symptoms worsen-please call  If fever also   Take the keflex as directed  Take the pyridium only if needed for urinary pain   Update if not starting to improve in several days  or if worsening    We will contact you with a urine culture result

## 2017-11-20 NOTE — Progress Notes (Signed)
Subjective:    Patient ID: Ann White, female    DOB: 1946-04-16, 72 y.o.   MRN: [id number]  HPI 72 yo pt of Dr Darnell Level here for urinary symptoms   Has pain/burning to urinate  She took some of the cystex over the counter - and it helped a little bit  Trying to drink a lot of water No blood in urine   No n/v  Has felt a little achey but no fever     Results for orders placed or performed in visit on 11/20/17  POCT Urinalysis Dipstick (Automated)  Result Value Ref Range   Color, UA yellow    Clarity, UA clear    Glucose, UA negative    Bilirubin, UA negative    Ketones, UA negative    Spec Grav, UA 1.020 1.010 - 1.025   Blood, UA negative    pH, UA 6.0 5.0 - 8.0   Protein, UA negative    Urobilinogen, UA 0.2 0.2 or 1.0 E.U./dL   Nitrite, UA negative    Leukocytes, UA Small (1+) (A) Negative     Pulse Readings from Last 3 Encounters:  11/20/17 (!) 45  07/17/17 (!) 57  07/17/17 (!) 53  per pt low pulse is normal   Patient Active Problem List   Diagnosis Date Noted  . UTI (urinary tract infection) 11/20/2017  . Nonischemic cardiomyopathy ([place]) 07/17/2017  . Acute respiratory infection 07/17/2017  . Premature atrial contraction 04/17/2017  . Abnormal EKG 04/17/2017  . Irregular heart beat 03/05/2017  . Left leg pain 12/18/2016  . Carotid stenosis 03/14/2016  . Health maintenance examination 02/28/2016  . Right knee pain 08/31/2015  . Right sided sciatica 07/15/2015  . Anemia, unspecified 06/29/2015  . Chronic right maxillary sinusitis 06/29/2015  . Ectatic abdominal aorta ([place])   . Encounter for therapeutic drug monitoring 05/30/2015  . Personal history of DVT (deep vein thrombosis) 05/30/2015  . Advanced care planning/counseling discussion 03/16/2015  . CKD (chronic kidney disease) stage 3, GFR 30-59 ml/min (HCC) 08/19/2013  . Hot flash, menopausal 02/24/2013  . Obesity, Class I, BMI 30-34.9 02/24/2013  . Medicare annual wellness visit, subsequent 08/27/2012    . Hyperglycemia 08/27/2012  . Lichen sclerosus et atrophicus   . Irritable bowel syndrome with constipation   . HTN (hypertension)   . Seasonal allergic rhinitis   . GERD (gastroesophageal reflux disease)   . MDD (major depressive disorder), recurrent episode, moderate ([place])   . History of diverticulitis of colon   . HLD (hyperlipidemia)   . PULMONARY FIBROSIS, POSTINFLAMMATORY 09/10/2008  . Cervical spondylosis without myelopathy 09/09/2008   Past Medical History:  Diagnosis Date  . Arthritis   . Cardiomyopathy ([place])    a. 04/2017 Echo: EF 35-40%, diff HK, mild MR, mildly dil LA, nl RV fxn.  . Carotid stenosis    a. Bilateral 40-59%, rpt 1 yr (02/2016); b. 03/2017 Carotid U/S: RICA 15-17%, LICA 6-16%, WVPX>10% - overall stable.  . Cervical spondylosis    s/p spine injections  . Depression   . Ectatic abdominal aorta ([place]) 2016   rpt [place] 5 yrs  . GERD (gastroesophageal reflux disease)   . History of chicken pox   . History of diverticulitis of colon   . HLD (hyperlipidemia)   . HTN (hypertension)   . Irritable bowel syndrome with constipation   . Lichen sclerosus et atrophicus   . Obesity   . Premature atrial contraction    a. 04/2017 48hr Holter: Predominant rhythm -  sinus. Rare PVC's, freq PAC's with freq atrial runs up to 34 beats-->metoprolol started.  . Seasonal allergic rhinitis    Past Surgical History:  Procedure Laterality Date  . bladder tack  1990s  . BREAST BIOPSY Right 03/05/2012    benign  . CARPAL TUNNEL RELEASE  2004, 2013   bilateral  . CHOLECYSTECTOMY  1990s  . COLONOSCOPY  2002  . COLONOSCOPY  06/2013   mod diverticulosis, 3 tubular adenomas, int hem rpt 5 yrs [facility])  . DEXA  07/2011   normal, T score -0.3  . ESOPHAGOGASTRODUODENOSCOPY ENDOSCOPY  2004   normal [facility])  . TOTAL ABDOMINAL HYSTERECTOMY W/ BILATERAL SALPINGOOPHORECTOMY  1990s   complete, dysmenorrhea and fibroids   Social History   Tobacco Use  . Smoking status: Former Smoker     Packs/day: 0.25    Years: 3.00    Pack years: 0.75    Types: Cigarettes    Last attempt to quit: 1990    Years since quitting: 29.3  . Smokeless tobacco: Never Used  . Tobacco comment: minimal smoking history  Substance Use Topics  . Alcohol use: No    Alcohol/week: 0.0 oz  . Drug use: No   Family History  Problem Relation Age of Onset  . CAD Mother 1       MI  . Hypertension Mother   . Parkinson's disease Father   . Diabetes Father   . CAD Father 20       MI  . Hyperlipidemia Sister   . Cancer Neg Hx   . Stroke Neg Hx   . Colon cancer Neg Hx   . Breast cancer Neg Hx    Allergies  Allergen Reactions  . Sulfa Antibiotics Swelling    Causes mouth to swell  . Tricor [Fenofibrate] Other (See Comments)    myalgias  . Zetia [Ezetimibe] Other (See Comments)    Dizziness  . Contrast Media [Iodinated Diagnostic Agents] Other (See Comments)    Oral contrast caused mouth blisters  . Lipitor [Atorvastatin] Other (See Comments)    myalgias  . Pravastatin Other (See Comments)    myalgias   Current Outpatient Medications on File Prior to Visit  Medication Sig Dispense Refill  . Acetaminophen (ARTHRITIS PAIN RELIEF PO) Take by mouth.    . Cholecalciferol (VITAMIN D3) 1000 UNITS CAPS Take 1 capsule (1,000 Units total) by mouth daily.    . clonazePAM (KLONOPIN) 1 MG tablet TAKE 1 TABLET BY MOUTH TWICE DAILY AS NEEDED 60 tablet 3  . Docusate Calcium (STOOL SOFTENER PO) Take by mouth daily.     Marland Kitchen gabapentin (NEURONTIN) 300 MG capsule TAKE 1 CAPSULE BY MOUTH EVERY MORNING AND 2 CAPSULES BY MOUTH EVERY NIGHT AT BEDTIME 270 capsule 2  . MAGNESIUM CITRATE PO Take by mouth.    . metoprolol succinate (TOPROL XL) 25 MG 24 hr tablet Take 0.5 tablets (12.5 mg total) by mouth daily. 45 tablet 3  . Misc Natural Products (SINUS FORMULA PO) Take by mouth.    . Multiple Vitamin (MULTIVITAMIN) tablet Take 1 tablet by mouth daily.    . Omega-3 Fatty Acids (FISH OIL) 1000 MG CAPS Take one by mouth  two times a day    . pantoprazole (PROTONIX) 40 MG tablet TAKE 1 TABLET BY MOUTH ONCE DAILY 90 tablet 2  . sertraline (ZOLOFT) 100 MG tablet TAKE 1 TABLET BY MOUTH ONCE DAILY 90 tablet 2  . traMADol (ULTRAM) 50 MG tablet TAKE 1 TABLET BY MOUTH THREE TIMES DAILY  AS NEEDED 120 tablet 0  . vitamin B-12 (CYANOCOBALAMIN) 1000 MCG tablet Take 1,000 mcg by mouth daily.    Marland Kitchen lisinopril (PRINIVIL,ZESTRIL) 2.5 MG tablet Take 1 tablet (2.5 mg total) by mouth daily. 90 tablet 1   No current facility-administered medications on file prior to visit.      Review of Systems  Constitutional: Positive for fatigue. Negative for activity change, appetite change and fever.       Some body aches   HENT: Negative for congestion and sore throat.   Eyes: Negative for itching and visual disturbance.  Respiratory: Negative for cough and shortness of breath.   Cardiovascular: Negative for leg swelling.  Gastrointestinal: Negative for abdominal distention, abdominal pain, constipation, diarrhea and nausea.  Endocrine: Negative for cold intolerance and polydipsia.  Genitourinary: Positive for dysuria, frequency and urgency. Negative for difficulty urinating, flank pain and hematuria.  Musculoskeletal: Negative for myalgias.  Skin: Negative for rash.  Allergic/Immunologic: Negative for immunocompromised state.  Neurological: Negative for dizziness and weakness.  Hematological: Negative for adenopathy.       Objective:   Physical Exam  Constitutional: She appears well-developed and well-nourished. No distress.  overwt and well appearing   HENT:  Head: Normocephalic and atraumatic.  Eyes: Pupils are equal, round, and reactive to light. Conjunctivae and EOM are normal.  Neck: Normal range of motion. Neck supple.  Cardiovascular: Normal rate, regular rhythm and normal heart sounds.  Pulmonary/Chest: Effort normal and breath sounds normal.  Abdominal: Soft. Bowel sounds are normal. She exhibits no distension.  There is tenderness. There is no rebound.  No cva tenderness  Mild suprapubic tenderness  Musculoskeletal: She exhibits no edema.  Lymphadenopathy:    She has no cervical adenopathy.  Neurological: She is alert.  Skin: No rash noted.  Psychiatric: She has a normal mood and affect.          Assessment & Plan:   Problem List Items Addressed This Visit      Genitourinary   UTI (urinary tract infection) - Primary    Bland urine but classic uti symptoms  Cover with keflex  Pyridium for symptoms Fluids   Update if not starting to improve in several days  or if worsening   Update if not starting to improve in a week or if worsening   Will call with cx result  Meds ordered this encounter  Medications  . cephALEXin (KEFLEX) 250 MG capsule    Sig: Take 1 capsule (250 mg total) by mouth 2 (two) times daily for 7 days.    Dispense:  14 capsule    Refill:  0  . phenazopyridine (PYRIDIUM) 200 MG tablet    Sig: Take 1 tablet (200 mg total) by mouth 3 (three) times daily as needed for pain (for urinary pain). Take with food    Dispense:  15 tablet    Refill:  0         Relevant Medications   cephALEXin (KEFLEX) 250 MG capsule   phenazopyridine (PYRIDIUM) 200 MG tablet   Other Relevant Orders   Urine Culture    Other Visit Diagnoses    Dysuria       Relevant Orders   POCT Urinalysis Dipstick (Automated) (Completed)

## 2017-11-21 LAB — URINE CULTURE
MICRO NUMBER:: 90561855
Result:: NO GROWTH
SPECIMEN QUALITY:: ADEQUATE

## 2017-11-21 NOTE — Assessment & Plan Note (Signed)
Bland urine but classic uti symptoms  Cover with keflex  Pyridium for symptoms Fluids   Update if not starting to improve in several days  or if worsening   Update if not starting to improve in a week or if worsening   Will call with cx result  Meds ordered this encounter  Medications  . cephALEXin (KEFLEX) 250 MG capsule    Sig: Take 1 capsule (250 mg total) by mouth 2 (two) times daily for 7 days.    Dispense:  14 capsule    Refill:  0  . phenazopyridine (PYRIDIUM) 200 MG tablet    Sig: Take 1 tablet (200 mg total) by mouth 3 (three) times daily as needed for pain (for urinary pain). Take with food    Dispense:  15 tablet    Refill:  0

## 2017-11-23 ENCOUNTER — Other Ambulatory Visit: Payer: Self-pay | Admitting: Nurse Practitioner

## 2017-11-25 ENCOUNTER — Other Ambulatory Visit: Payer: Self-pay | Admitting: *Deleted

## 2017-11-25 MED ORDER — LISINOPRIL 2.5 MG PO TABS: 2.5000 mg | ORAL_TABLET | Freq: Every day | ORAL | 0 refills | Status: DC

## 2017-12-04 ENCOUNTER — Ambulatory Visit (INDEPENDENT_AMBULATORY_CARE_PROVIDER_SITE_OTHER): Payer: Medicare HMO | Admitting: Family Medicine

## 2017-12-04 ENCOUNTER — Encounter: Payer: Self-pay | Admitting: Family Medicine

## 2017-12-04 VITALS — BP 110/60 | HR 47 | Temp 97.8°F | Ht 64.0 in | Wt 168.8 lb

## 2017-12-04 DIAGNOSIS — M170 Bilateral primary osteoarthritis of knee: Secondary | ICD-10-CM

## 2017-12-04 MED ORDER — METHYLPREDNISOLONE ACETATE 40 MG/ML IJ SUSP
80.0000 mg | Freq: Once | INTRAMUSCULAR | Status: AC
  Administered 2017-12-04: 80 mg via INTRA_ARTICULAR

## 2017-12-04 NOTE — Progress Notes (Signed)
Dr. Frederico Hamman T. Nawal Burling, MD, [facility] [place], [address] Phone: [phone] Fax: [phone]  12/04/2017  Patient: Ann White, MRN: [id number], DOB: 1946/03/18, 72 y.o.  Primary Physician:  Ria Bush, MD   Chief Complaint  Patient presents with  . Knee Pain    Bilateral   Subjective:   Ann White is a 72 y.o. very pleasant female patient who presents with the following:  Taking some tramadol and tylenol.  I looked at the patient's films from 2017, the patient does have some moderate tricompartmental osteoarthritis.  Now she is having some worsening of her symptoms and flaring up of her symptoms, pain with getting up and down out of a chair as well as going up and down stairs.  She also having some pain with rotational movements.  Is having only some mild swelling.  b inj  Past Medical History, Surgical History, Social History, Family History, Problem List, Medications, and Allergies have been reviewed and updated if relevant.  Patient Active Problem List   Diagnosis Date Noted  . UTI (urinary tract infection) 11/20/2017  . Nonischemic cardiomyopathy ([place]) 07/17/2017  . Acute respiratory infection 07/17/2017  . Premature atrial contraction 04/17/2017  . Abnormal EKG 04/17/2017  . Irregular heart beat 03/05/2017  . Left leg pain 12/18/2016  . Carotid stenosis 03/14/2016  . Health maintenance examination 02/28/2016  . Right knee pain 08/31/2015  . Right sided sciatica 07/15/2015  . Anemia, unspecified 06/29/2015  . Chronic right maxillary sinusitis 06/29/2015  . Ectatic abdominal aorta ([place])   . Encounter for therapeutic drug monitoring 05/30/2015  . Personal history of DVT (deep vein thrombosis) 05/30/2015  . Advanced care planning/counseling discussion 03/16/2015  . CKD (chronic kidney disease) stage 3, GFR 30-59 ml/min (HCC) 08/19/2013  . Hot flash, menopausal 02/24/2013  . Obesity, Class I,  BMI 30-34.9 02/24/2013  . Medicare annual wellness visit, subsequent 08/27/2012  . Hyperglycemia 08/27/2012  . Lichen sclerosus et atrophicus   . Irritable bowel syndrome with constipation   . HTN (hypertension)   . Seasonal allergic rhinitis   . GERD (gastroesophageal reflux disease)   . MDD (major depressive disorder), recurrent episode, moderate ([place])   . History of diverticulitis of colon   . HLD (hyperlipidemia)   . PULMONARY FIBROSIS, POSTINFLAMMATORY 09/10/2008  . Cervical spondylosis without myelopathy 09/09/2008    Past Medical History:  Diagnosis Date  . Arthritis   . Cardiomyopathy ([place])    a. 04/2017 Echo: EF 35-40%, diff HK, mild MR, mildly dil LA, nl RV fxn.  . Carotid stenosis    a. Bilateral 40-59%, rpt 1 yr (02/2016); b. 03/2017 Carotid U/S: RICA 25-85%, LICA 2-77%, OEUM>35% - overall stable.  . Cervical spondylosis    s/p spine injections  . Depression   . Ectatic abdominal aorta ([place]) 2016   rpt [place] 5 yrs  . GERD (gastroesophageal reflux disease)   . History of chicken pox   . History of diverticulitis of colon   . HLD (hyperlipidemia)   . HTN (hypertension)   . Irritable bowel syndrome with constipation   . Lichen sclerosus et atrophicus   . Obesity   . Premature atrial contraction    a. 04/2017 48hr Holter: Predominant rhythm - sinus. Rare PVC's, freq PAC's with freq atrial runs up to 34 beats-->metoprolol started.  . Seasonal allergic rhinitis     Past Surgical History:  Procedure Laterality Date  . bladder tack  1990s  .  BREAST BIOPSY Right 03/05/2012    benign  . CARPAL TUNNEL RELEASE  2004, 2013   bilateral  . CHOLECYSTECTOMY  1990s  . COLONOSCOPY  2002  . COLONOSCOPY  06/2013   mod diverticulosis, 3 tubular adenomas, int hem rpt 5 yrs [facility] Plan)  . DEXA  07/2011   normal, T score -0.3  . ESOPHAGOGASTRODUODENOSCOPY ENDOSCOPY  2004   normal [facility] Plan)  . TOTAL ABDOMINAL HYSTERECTOMY W/ BILATERAL SALPINGOOPHORECTOMY  1990s   complete, dysmenorrhea  and fibroids    Social History   Socioeconomic History  . Marital status: Married    Spouse name: Not on file  . Number of children: Not on file  . Years of education: Not on file  . Highest education level: Not on file  Occupational History  . Not on file  Social Needs  . Financial resource strain: Not on file  . Food insecurity:    Worry: Not on file    Inability: Not on file  . Transportation needs:    Medical: Not on file    Non-medical: Not on file  Tobacco Use  . Smoking status: Former Smoker    Packs/day: 0.25    Years: 3.00    Pack years: 0.75    Types: Cigarettes    Last attempt to quit: 1990    Years since quitting: 29.4  . Smokeless tobacco: Never Used  . Tobacco comment: minimal smoking history  Substance and Sexual Activity  . Alcohol use: No    Alcohol/week: 0.0 oz  . Drug use: No  . Sexual activity: Never  Lifestyle  . Physical activity:    Days per week: Not on file    Minutes per session: Not on file  . Stress: Not on file  Relationships  . Social connections:    Talks on phone: Not on file    Gets together: Not on file    Attends religious service: Not on file    Active member of club or organization: Not on file    Attends meetings of clubs or organizations: Not on file    Relationship status: Not on file  . Intimate partner violence:    Fear of current or ex partner: Not on file    Emotionally abused: Not on file    Physically abused: Not on file    Forced sexual activity: Not on file  Other Topics Concern  . Not on file  Social History Narrative   Lives with husband and 2 cats.  2 grown children, 5 grandchildren.   Occupation: retired, worked in [employer]   Activity: no regular exercise.  Does walk in summer   Diet: fruits/vegetables daily, good water.    Family History  Problem Relation Age of Onset  . CAD Mother 72       MI  . Hypertension Mother   . Parkinson's disease Father   . Diabetes Father   . CAD Father 36       MI  .  Hyperlipidemia Sister   . Cancer Neg Hx   . Stroke Neg Hx   . Colon cancer Neg Hx   . Breast cancer Neg Hx     Allergies  Allergen Reactions  . Sulfa Antibiotics Swelling    Causes mouth to swell  . Tricor [Fenofibrate] Other (See Comments)    myalgias  . Zetia [Ezetimibe] Other (See Comments)    Dizziness  . Contrast Media [Iodinated Diagnostic Agents] Other (See Comments)    Oral contrast caused mouth blisters  .  Lipitor [Atorvastatin] Other (See Comments)    myalgias  . Pravastatin Other (See Comments)    myalgias    Medication list reviewed and updated in full in [facility].  GEN: No fevers, chills. Nontoxic. Primarily MSK c/o today. MSK: Detailed in the HPI GI: tolerating PO intake without difficulty Neuro: No numbness, parasthesias, or tingling associated. Otherwise the pertinent positives of the ROS are noted above.   Objective:   BP 110/60   Pulse (!) 47   Temp 97.8 F (36.6 C) (Oral)   Ht 5\' 4"  (1.626 m)   Wt 168 lb 12 oz (76.5 kg)   BMI 28.97 kg/m    GEN: WDWN, NAD, Non-toxic, Alert & Oriented x 3 HEENT: Atraumatic, Normocephalic.  Ears and Nose: No external deformity. EXTR: No clubbing/cyanosis/edema NEURO: Normal gait.  PSYCH: Normally interactive. Conversant. Not depressed or anxious appearing.  Calm demeanor.   Knee:  B Gait: Normal heel toe pattern ROM: 0-120 Effusion: neg Echymosis or edema: none Patellar tendon NT Painful PLICA: neg Patellar grind: negative Medial and lateral patellar facet loading: negative medial and lateral joint lines: both Mcmurray's pain Flexion-pinch pain Varus and valgus stress: stable Lachman: neg Ant and Post drawer: neg Hip abduction, IR, ER: WNL Hip flexion str: 5/5 Hip abd: 5/5 Quad: 5/5 VMO atrophy:No Hamstring concentric and eccentric: 5/5   Radiology: No results found.  Assessment and Plan:   Bilateral primary osteoarthritis of knee - Plan: methylPREDNISolone acetate (DEPO-MEDROL)  injection 80 mg, methylPREDNISolone acetate (DEPO-MEDROL) injection 80 mg  We discussed treatment strategies with OA, exercise, weight control. Continued conservative care.   Knee Injection, RIGHT Patient verbally consented to procedure. Risks (including potential rare risk of infection), benefits, and alternatives explained. Sterilely prepped with Chloraprep. Ethyl cholride used for anesthesia. 8 cc Lidocaine 1% mixed with 2 mL Depo-Medrol 40 mg injected using the anteromedial approach without difficulty. No complications with procedure and tolerated well. Patient had decreased pain post-injection.   Knee Injection, LEFT Patient verbally consented to procedure. Risks (including potential rare risk of infection), benefits, and alternatives explained. Sterilely prepped with Chloraprep. Ethyl cholride used for anesthesia. 8 cc Lidocaine 1% mixed with 2 mL Depo-Medrol 40 mg injected using the anteromedial approach without difficulty. No complications with procedure and tolerated well. Patient had decreased pain post-injection.    Follow-up: No follow-ups on file.  Meds ordered this encounter  Medications  . methylPREDNISolone acetate (DEPO-MEDROL) injection 80 mg  . methylPREDNISolone acetate (DEPO-MEDROL) injection 80 mg   Signed,  Haillee Johann T. Tavius Turgeon, MD   Allergies as of 12/04/2017      Reactions   Sulfa Antibiotics Swelling   Causes mouth to swell   Tricor [fenofibrate] Other (See Comments)   myalgias   Zetia [ezetimibe] Other (See Comments)   Dizziness   Contrast Media [iodinated Diagnostic Agents] Other (See Comments)   Oral contrast caused mouth blisters   Lipitor [atorvastatin] Other (See Comments)   myalgias   Pravastatin Other (See Comments)   myalgias      Medication List        Accurate as of 12/04/17 11:59 PM. Always use your most recent med list.          ARTHRITIS PAIN RELIEF PO Take by mouth.   clonazePAM 1 MG tablet Commonly known as:  KLONOPIN TAKE 1  TABLET BY MOUTH TWICE DAILY AS NEEDED   Fish Oil 1000 MG Caps Take one by mouth two times a day   gabapentin 300 MG capsule Commonly known  as:  NEURONTIN TAKE 1 CAPSULE BY MOUTH EVERY MORNING AND 2 CAPSULES BY MOUTH EVERY NIGHT AT BEDTIME   lisinopril 2.5 MG tablet Commonly known as:  PRINIVIL,ZESTRIL TAKE 1 TABLET BY MOUTH ONCE DAILY   MAGNESIUM CITRATE PO Take by mouth.   metoprolol succinate 25 MG 24 hr tablet Commonly known as:  TOPROL XL Take 0.5 tablets (12.5 mg total) by mouth daily.   multivitamin tablet Take 1 tablet by mouth daily.   pantoprazole 40 MG tablet Commonly known as:  PROTONIX TAKE 1 TABLET BY MOUTH ONCE DAILY   sertraline 100 MG tablet Commonly known as:  ZOLOFT TAKE 1 TABLET BY MOUTH ONCE DAILY   SINUS FORMULA PO Take by mouth.   STOOL SOFTENER PO Take by mouth daily.   traMADol 50 MG tablet Commonly known as:  ULTRAM TAKE 1 TABLET BY MOUTH THREE TIMES DAILY AS NEEDED   vitamin B-12 1000 MCG tablet Commonly known as:  CYANOCOBALAMIN Take 1,000 mcg by mouth daily.   Vitamin D3 1000 units Caps Take 1 capsule (1,000 Units total) by mouth daily.

## 2017-12-09 ENCOUNTER — Other Ambulatory Visit: Payer: Self-pay | Admitting: Family Medicine

## 2017-12-11 ENCOUNTER — Ambulatory Visit (INDEPENDENT_AMBULATORY_CARE_PROVIDER_SITE_OTHER): Payer: Medicare HMO | Admitting: Family Medicine

## 2017-12-11 ENCOUNTER — Encounter: Payer: Self-pay | Admitting: Family Medicine

## 2017-12-11 VITALS — BP 106/64 | HR 51 | Temp 98.4°F | Ht 64.0 in | Wt 167.5 lb

## 2017-12-11 DIAGNOSIS — T63441A Toxic effect of venom of bees, accidental (unintentional), initial encounter: Secondary | ICD-10-CM | POA: Insufficient documentation

## 2017-12-11 DIAGNOSIS — T63451A Toxic effect of venom of hornets, accidental (unintentional), initial encounter: Secondary | ICD-10-CM

## 2017-12-11 DIAGNOSIS — T63461A Toxic effect of venom of wasps, accidental (unintentional), initial encounter: Secondary | ICD-10-CM | POA: Diagnosis not present

## 2017-12-11 MED ORDER — TRIAMCINOLONE ACETONIDE 0.1 % EX CREA: 1.0000 "application " | TOPICAL_CREAM | Freq: Two times a day (BID) | CUTANEOUS | 0 refills | Status: DC

## 2017-12-11 NOTE — Progress Notes (Signed)
Subjective:    Patient ID: Ann White, female    DOB: December 24, 1945, 72 y.o.   MRN: [id number]  HPI  Here for bug bite on L arm   Planted flowers last night  She bent over to pick something up  She was bitten or stung by something- it really hurt and she did not see what it was  Now arm is quite red and swollen Warm to the touch Itch/burn/hurt all night   Thinks it was a sting No hx of venom allergy   No mouth/throat swelling No wheezing   She put cortisone cream on it last night  Also topical product with calamaine, benzyl alcohol and benadryl  No oral antihistamines    Patient Active Problem List   Diagnosis Date Noted  . Sting from hornet, wasp, or bee, accidental or unintentional, initial encounter 12/11/2017  . UTI (urinary tract infection) 11/20/2017  . Nonischemic cardiomyopathy ([place]) 07/17/2017  . Acute respiratory infection 07/17/2017  . Premature atrial contraction 04/17/2017  . Abnormal EKG 04/17/2017  . Irregular heart beat 03/05/2017  . Left leg pain 12/18/2016  . Carotid stenosis 03/14/2016  . Health maintenance examination 02/28/2016  . Right knee pain 08/31/2015  . Right sided sciatica 07/15/2015  . Anemia, unspecified 06/29/2015  . Chronic right maxillary sinusitis 06/29/2015  . Ectatic abdominal aorta ([place])   . Encounter for therapeutic drug monitoring 05/30/2015  . Personal history of DVT (deep vein thrombosis) 05/30/2015  . Advanced care planning/counseling discussion 03/16/2015  . CKD (chronic kidney disease) stage 3, GFR 30-59 ml/min (HCC) 08/19/2013  . Hot flash, menopausal 02/24/2013  . Obesity, Class I, BMI 30-34.9 02/24/2013  . Medicare annual wellness visit, subsequent 08/27/2012  . Hyperglycemia 08/27/2012  . Lichen sclerosus et atrophicus   . Irritable bowel syndrome with constipation   . HTN (hypertension)   . Seasonal allergic rhinitis   . GERD (gastroesophageal reflux disease)   . MDD (major depressive disorder), recurrent  episode, moderate ([place])   . History of diverticulitis of colon   . HLD (hyperlipidemia)   . PULMONARY FIBROSIS, POSTINFLAMMATORY 09/10/2008  . Cervical spondylosis without myelopathy 09/09/2008   Past Medical History:  Diagnosis Date  . Arthritis   . Cardiomyopathy ([place])    a. 04/2017 Echo: EF 35-40%, diff HK, mild MR, mildly dil LA, nl RV fxn.  . Carotid stenosis    a. Bilateral 40-59%, rpt 1 yr (02/2016); b. 03/2017 Carotid U/S: RICA 14-97%, LICA 0-26%, VZCH>88% - overall stable.  . Cervical spondylosis    s/p spine injections  . Depression   . Ectatic abdominal aorta ([place]) 2016   rpt [place] 5 yrs  . GERD (gastroesophageal reflux disease)   . History of chicken pox   . History of diverticulitis of colon   . HLD (hyperlipidemia)   . HTN (hypertension)   . Irritable bowel syndrome with constipation   . Lichen sclerosus et atrophicus   . Obesity   . Premature atrial contraction    a. 04/2017 48hr Holter: Predominant rhythm - sinus. Rare PVC's, freq PAC's with freq atrial runs up to 34 beats-->metoprolol started.  . Seasonal allergic rhinitis    Past Surgical History:  Procedure Laterality Date  . bladder tack  1990s  . BREAST BIOPSY Right 03/05/2012    benign  . CARPAL TUNNEL RELEASE  2004, 2013   bilateral  . CHOLECYSTECTOMY  1990s  . COLONOSCOPY  2002  . COLONOSCOPY  06/2013   mod diverticulosis, 3 tubular adenomas, int  hem rpt 5 yrs [facility] Plan)  . DEXA  07/2011   normal, T score -0.3  . ESOPHAGOGASTRODUODENOSCOPY ENDOSCOPY  2004   normal [facility] Plan)  . TOTAL ABDOMINAL HYSTERECTOMY W/ BILATERAL SALPINGOOPHORECTOMY  1990s   complete, dysmenorrhea and fibroids   Social History   Tobacco Use  . Smoking status: Former Smoker    Packs/day: 0.25    Years: 3.00    Pack years: 0.75    Types: Cigarettes    Last attempt to quit: 1990    Years since quitting: 29.4  . Smokeless tobacco: Never Used  . Tobacco comment: minimal smoking history  Substance Use Topics  . Alcohol use: No      Alcohol/week: 0.0 oz  . Drug use: No   Family History  Problem Relation Age of Onset  . CAD Mother 29       MI  . Hypertension Mother   . Parkinson's disease Father   . Diabetes Father   . CAD Father 13       MI  . Hyperlipidemia Sister   . Cancer Neg Hx   . Stroke Neg Hx   . Colon cancer Neg Hx   . Breast cancer Neg Hx    Allergies  Allergen Reactions  . Sulfa Antibiotics Swelling    Causes mouth to swell  . Tricor [Fenofibrate] Other (See Comments)    myalgias  . Zetia [Ezetimibe] Other (See Comments)    Dizziness  . Contrast Media [Iodinated Diagnostic Agents] Other (See Comments)    Oral contrast caused mouth blisters  . Lipitor [Atorvastatin] Other (See Comments)    myalgias  . Pravastatin Other (See Comments)    myalgias   Current Outpatient Medications on File Prior to Visit  Medication Sig Dispense Refill  . Acetaminophen (ARTHRITIS PAIN RELIEF PO) Take by mouth.    . Cholecalciferol (VITAMIN D3) 1000 UNITS CAPS Take 1 capsule (1,000 Units total) by mouth daily.    . clonazePAM (KLONOPIN) 1 MG tablet TAKE 1 TABLET BY MOUTH TWICE DAILY AS NEEDED 60 tablet 3  . Docusate Calcium (STOOL SOFTENER PO) Take by mouth daily.     Marland Kitchen gabapentin (NEURONTIN) 300 MG capsule TAKE 1 CAPSULE BY MOUTH EVERY MORNING AND 2 CAPSULES BY MOUTH EVERY NIGHT AT BEDTIME 270 capsule 2  . lisinopril (PRINIVIL,ZESTRIL) 2.5 MG tablet TAKE 1 TABLET BY MOUTH ONCE DAILY 90 tablet 1  . MAGNESIUM CITRATE PO Take by mouth.    . metoprolol succinate (TOPROL XL) 25 MG 24 hr tablet Take 0.5 tablets (12.5 mg total) by mouth daily. 45 tablet 3  . Misc Natural Products (SINUS FORMULA PO) Take by mouth.    . Multiple Vitamin (MULTIVITAMIN) tablet Take 1 tablet by mouth daily.    . Omega-3 Fatty Acids (FISH OIL) 1000 MG CAPS Take one by mouth two times a day    . pantoprazole (PROTONIX) 40 MG tablet TAKE 1 TABLET BY MOUTH ONCE DAILY 90 tablet 2  . sertraline (ZOLOFT) 100 MG tablet TAKE 1 TABLET BY MOUTH  ONCE DAILY 90 tablet 0  . traMADol (ULTRAM) 50 MG tablet TAKE 1 TABLET BY MOUTH THREE TIMES DAILY AS NEEDED 120 tablet 0  . vitamin B-12 (CYANOCOBALAMIN) 1000 MCG tablet Take 1,000 mcg by mouth daily.     No current facility-administered medications on file prior to visit.      Review of Systems  Constitutional: Negative for activity change, appetite change, fatigue, fever and unexpected weight change.  HENT: Negative for congestion, ear pain,  rhinorrhea, sinus pressure and sore throat.   Eyes: Negative for pain, redness and visual disturbance.  Respiratory: Negative for cough, shortness of breath and wheezing.   Cardiovascular: Negative for chest pain and palpitations.  Gastrointestinal: Negative for abdominal pain, blood in stool, constipation and diarrhea.  Endocrine: Negative for polydipsia and polyuria.  Genitourinary: Negative for dysuria, frequency and urgency.  Musculoskeletal: Negative for arthralgias, back pain and myalgias.  Skin: Positive for color change. Negative for pallor and rash.       sting  Allergic/Immunologic: Negative for environmental allergies.  Neurological: Negative for dizziness, syncope and headaches.  Hematological: Negative for adenopathy. Does not bruise/bleed easily.  Psychiatric/Behavioral: Negative for decreased concentration and dysphoric mood. The patient is not nervous/anxious.        Objective:   Physical Exam  Constitutional: She appears well-developed and well-nourished. No distress.  HENT:  Head: Normocephalic and atraumatic.  Mouth/Throat: Oropharynx is clear and moist.  No facial swelling  Eyes: Pupils are equal, round, and reactive to light. Conjunctivae and EOM are normal. Right eye exhibits no discharge. Left eye exhibits no discharge. No scleral icterus.  Neck: Normal range of motion. Neck supple.  Cardiovascular: Normal rate and regular rhythm.  Pulmonary/Chest: Effort normal and breath sounds normal. No stridor. No respiratory  distress. She has no wheezes.  Lymphadenopathy:    She has no cervical adenopathy.  Skin: Skin is warm and dry. There is erythema.  6 by 8 cm area of erythema and induration on L upper arm consistent with sting rxn   No streaking No urticaria   Psychiatric: She has a normal mood and affect.  pleasant          Assessment & Plan:   Problem List Items Addressed This Visit      Other   Sting from hornet, wasp, or bee, accidental or unintentional, initial encounter - Primary    With local rxn No anaphylaxis or angioedema  Recommend elevation/ice and antihistamine  Triamcinolone cream bid  Update if not starting to improve in a week or if worsening

## 2017-12-11 NOTE — Patient Instructions (Signed)
Get some zyrtec 10 mg over the counter  Take 1 pill each evening for itching (helps with allergies also)   Keep the sting area clean with soap and water  Cool compress when you can  Elevate arm for swelling   Use triamcinolone cream twice daily   Update if not starting to improve in a week or if worsening

## 2017-12-14 ENCOUNTER — Other Ambulatory Visit: Payer: Self-pay | Admitting: Family Medicine

## 2017-12-15 NOTE — Assessment & Plan Note (Signed)
With local rxn No anaphylaxis or angioedema  Recommend elevation/ice and antihistamine  Triamcinolone cream bid  Update if not starting to improve in a week or if worsening

## 2018-01-15 ENCOUNTER — Telehealth: Payer: Self-pay | Admitting: Family Medicine

## 2018-01-15 NOTE — Telephone Encounter (Signed)
Copied from [place] [id number]. Topic: Inquiry >> Jan 15, 2018 10:05 AM Mylinda Latina, NT wrote: Reason for CRM: patient called and states she dropped off papers that was in reference to [employer]. Patient states she need a letter states she had blood clots before and cant sit for long period of time. Please call patient when that letter is written and ready for pick up. CB# [phone]

## 2018-01-17 NOTE — Telephone Encounter (Signed)
Letter written and in lisa's box. I wrote a letter she needs to have medical accommodations due to chronic medical condition and if this cannot be done then I recommend permanent exemption status.

## 2018-01-17 NOTE — Telephone Encounter (Signed)
Spoke with pt notifying her the letter is ready to pick up.  [Placed letter at front office.]

## 2018-01-25 ENCOUNTER — Other Ambulatory Visit: Payer: Self-pay | Admitting: Family Medicine

## 2018-01-26 NOTE — Telephone Encounter (Signed)
Eprescribed.

## 2018-01-30 ENCOUNTER — Other Ambulatory Visit: Payer: Self-pay | Admitting: Family Medicine

## 2018-01-30 NOTE — Telephone Encounter (Signed)
Name of Medication:  Tramadol Name of Pharmacy:  [facility] [address] Last Fill or Written Date and Quantity: 10/23/17, #120 Last Office Visit and Type: 12/11/17, acute Next Office Visit and Type: 03/13/18, CPE Last Controlled Substance Agreement Date: 03/16/15 Last UDS:  03/16/15

## 2018-01-31 NOTE — Telephone Encounter (Signed)
Eprescribed.

## 2018-03-04 ENCOUNTER — Other Ambulatory Visit: Payer: Self-pay | Admitting: Family Medicine

## 2018-03-04 DIAGNOSIS — N183 Chronic kidney disease, stage 3 unspecified: Secondary | ICD-10-CM

## 2018-03-04 DIAGNOSIS — E7801 Familial hypercholesterolemia: Secondary | ICD-10-CM

## 2018-03-06 ENCOUNTER — Ambulatory Visit (INDEPENDENT_AMBULATORY_CARE_PROVIDER_SITE_OTHER): Payer: Medicare HMO

## 2018-03-06 ENCOUNTER — Ambulatory Visit: Payer: Medicare HMO

## 2018-03-06 VITALS — BP 140/76 | HR 43 | Temp 97.8°F | Ht 61.5 in | Wt 168.5 lb

## 2018-03-06 DIAGNOSIS — Z0000 Encounter for general adult medical examination without abnormal findings: Secondary | ICD-10-CM | POA: Diagnosis not present

## 2018-03-06 DIAGNOSIS — N183 Chronic kidney disease, stage 3 unspecified: Secondary | ICD-10-CM

## 2018-03-06 DIAGNOSIS — E7801 Familial hypercholesterolemia: Secondary | ICD-10-CM | POA: Diagnosis not present

## 2018-03-06 LAB — CBC WITH DIFFERENTIAL/PLATELET
Basophils Absolute: 0 10*3/uL (ref 0.0–0.1)
Basophils Relative: 0.6 % (ref 0.0–3.0)
Eosinophils Absolute: 0.2 10*3/uL (ref 0.0–0.7)
Eosinophils Relative: 5.2 % — ABNORMAL HIGH (ref 0.0–5.0)
HCT: 31.9 % — ABNORMAL LOW (ref 36.0–46.0)
Hemoglobin: 10.9 g/dL — ABNORMAL LOW (ref 12.0–15.0)
Lymphocytes Relative: 39.7 % (ref 12.0–46.0)
Lymphs Abs: 1.8 10*3/uL (ref 0.7–4.0)
MCHC: 34 g/dL (ref 30.0–36.0)
MCV: 89.4 fl (ref 78.0–100.0)
Monocytes Absolute: 0.2 10*3/uL (ref 0.1–1.0)
Monocytes Relative: 4.9 % (ref 3.0–12.0)
Neutro Abs: 2.2 10*3/uL (ref 1.4–7.7)
Neutrophils Relative %: 49.6 % (ref 43.0–77.0)
Platelets: 169 10*3/uL (ref 150.0–400.0)
RBC: 3.57 Mil/uL — ABNORMAL LOW (ref 3.87–5.11)
RDW: 13.9 % (ref 11.5–15.5)
WBC: 4.4 10*3/uL (ref 4.0–10.5)

## 2018-03-06 LAB — COMPREHENSIVE METABOLIC PANEL
ALT: 10 U/L (ref 0–35)
AST: 16 U/L (ref 0–37)
Albumin: 4.5 g/dL (ref 3.5–5.2)
Alkaline Phosphatase: 39 U/L (ref 39–117)
BUN: 29 mg/dL — ABNORMAL HIGH (ref 6–23)
CO2: 29 mEq/L (ref 19–32)
Calcium: 9.9 mg/dL (ref 8.4–10.5)
Chloride: 103 mEq/L (ref 96–112)
Creatinine, Ser: 1.26 mg/dL — ABNORMAL HIGH (ref 0.40–1.20)
GFR: 44.51 mL/min — ABNORMAL LOW (ref >60.00)
Glucose, Bld: 97 mg/dL (ref 70–99)
Potassium: 4.6 mEq/L (ref 3.5–5.1)
Sodium: 140 mEq/L (ref 135–145)
Total Bilirubin: 0.4 mg/dL (ref 0.2–1.2)
Total Protein: 7.5 g/dL (ref 6.0–8.3)

## 2018-03-06 LAB — LIPID PANEL
Cholesterol: 314 mg/dL — ABNORMAL HIGH (ref 0–200)
HDL: 46 mg/dL (ref >39.00)
NonHDL: 268.03
Total CHOL/HDL Ratio: 7
Triglycerides: 240 mg/dL — ABNORMAL HIGH (ref 0.0–149.0)
VLDL: 48 mg/dL — ABNORMAL HIGH (ref 0.0–40.0)

## 2018-03-06 LAB — LDL CHOLESTEROL, DIRECT: Direct LDL: 217 mg/dL

## 2018-03-06 LAB — VITAMIN D 25 HYDROXY (VIT D DEFICIENCY, FRACTURES): VITD: 37.31 ng/mL (ref 30.00–100.00)

## 2018-03-06 NOTE — Patient Instructions (Signed)
Ann White , Thank you for taking time to come for your Medicare Wellness Visit. I appreciate your ongoing commitment to your health goals. Please review the following plan we discussed and let me know if I can assist you in the future.   These are the goals we discussed: Goals    . DIET - INCREASE WATER INTAKE     Starting 03/06/2018, I will attempt to drink at least 6-8 glasses of water daily.        This is a list of the screening recommended for you and due dates:  Health Maintenance  Topic Date Due  . Flu Shot  10/15/2018*  . Colon Cancer Screening  07/02/2018  . Mammogram  10/24/2018  . DTaP/Tdap/Td vaccine (2 - Td) 08/27/2022  . Tetanus Vaccine  08/27/2022  . DEXA scan (bone density measurement)  Completed  .  Hepatitis C: One time screening is recommended by Center for Disease Control  (CDC) for  adults born from [birth year] through 1964.   Completed  . Pneumonia vaccines  Completed  *Topic was postponed. The date shown is not the original due date.   Preventive Care for Adults  A healthy lifestyle and preventive care can promote health and wellness. Preventive health guidelines for adults include the following key practices.  . A routine yearly physical is a good way to check with your health care provider about your health and preventive screening. It is a chance to share any concerns and updates on your health and to receive a thorough exam.  . Visit your dentist for a routine exam and preventive care every 6 months. Brush your teeth twice a day and floss once a day. Good oral hygiene prevents tooth decay and gum disease.  . The frequency of eye exams is based on your age, health, family medical history, use  of contact lenses, and other factors. Follow your health care provider's recommendations for frequency of eye exams.  . Eat a healthy diet. Foods like vegetables, fruits, whole grains, low-fat dairy products, and lean protein foods contain the nutrients you need without too  many calories. Decrease your intake of foods high in solid fats, added sugars, and salt. Eat the right amount of calories for you. Get information about a proper diet from your health care provider, if necessary.  . Regular physical exercise is one of the most important things you can do for your health. Most adults should get at least 150 minutes of moderate-intensity exercise (any activity that increases your heart rate and causes you to sweat) each week. In addition, most adults need muscle-strengthening exercises on 2 or more days a week.  Silver Sneakers may be a benefit available to you. To determine eligibility, you may visit the website: www.silversneakers.com or contact program at (725)528-8554 Mon-Fri between 8AM-8PM.   . Maintain a healthy weight. The body mass index (BMI) is a screening tool to identify possible weight problems. It provides an estimate of body fat based on height and weight. Your health care provider can find your BMI and can help you achieve or maintain a healthy weight.   For adults 20 years and older: ? A BMI below 18.5 is considered underweight. ? A BMI of 18.5 to 24.9 is normal. ? A BMI of 25 to 29.9 is considered overweight. ? A BMI of 30 and above is considered obese.   . Maintain normal blood lipids and cholesterol levels by exercising and minimizing your intake of saturated fat. Eat a balanced diet  with plenty of fruit and vegetables. Blood tests for lipids and cholesterol should begin at age 62 and be repeated every 5 years. If your lipid or cholesterol levels are high, you are over 50, or you are at high risk for heart disease, you may need your cholesterol levels checked more frequently. Ongoing high lipid and cholesterol levels should be treated with medicines if diet and exercise are not working.  . If you smoke, find out from your health care provider how to quit. If you do not use tobacco, please do not start.  . If you choose to drink alcohol, please  do not consume more than 2 drinks per day. One drink is considered to be 12 ounces (355 mL) of beer, 5 ounces (148 mL) of wine, or 1.5 ounces (44 mL) of liquor.  . If you are 33-37 years old, ask your health care provider if you should take aspirin to prevent strokes.  . Use sunscreen. Apply sunscreen liberally and repeatedly throughout the day. You should seek shade when your shadow is shorter than you. Protect yourself by wearing long sleeves, pants, a wide-brimmed hat, and sunglasses year round, whenever you are outdoors.  . Once a month, do a whole body skin exam, using a mirror to look at the skin on your back. Tell your health care provider of new moles, moles that have irregular borders, moles that are larger than a pencil eraser, or moles that have changed in shape or color.

## 2018-03-06 NOTE — Progress Notes (Signed)
Subjective:   Ann White is a 72 y.o. female who presents for Medicare Annual (Subsequent) preventive examination.  Review of Systems:  N/A Cardiac Risk Factors include: advanced age (>43men, >68 women);hypertension;dyslipidemia;obesity (BMI >30kg/m2)     Objective:     Vitals: BP 140/76 (BP Location: Right Arm, Patient Position: Sitting, Cuff Size: Normal)   Pulse (!) 43   Temp 97.8 F (36.6 C) (Oral)   Ht 5' 1.5" (1.562 m) Comment: no shoes  Wt 168 lb 8 oz (76.4 kg)   SpO2 96%   BMI 31.32 kg/m   Body mass index is 31.32 kg/m.    Advanced Directives 02/26/2017 02/21/2016  Does Patient Have a Medical Advance Directive? Yes Yes  Type of Paramedic of [place];Living will [place];Living will  Copy of [place] in Chart? No - copy requested No - copy requested    Tobacco Social History   Tobacco Use  Smoking Status Former Smoker  . Packs/day: 0.25  . Years: 3.00  . Pack years: 0.75  . Types: Cigarettes  . Last attempt to quit: 1990  . Years since quitting: 29.6  Smokeless Tobacco Never Used  Tobacco Comment   minimal smoking history     Counseling given: No Comment: minimal smoking history   Clinical Intake:  Pre-visit preparation completed: Yes  Pain : No/denies pain Pain Score: 0-No pain     Nutritional Status: BMI > 30  Obese Nutritional Risks: None Diabetes: No  How often do you need to have someone help you when you read instructions, pamphlets, or other written materials from your doctor or pharmacy?: 1 - Never What is the last grade level you completed in school?: 12th grade  Interpreter Needed?: No  Comments: pt lives with spouse Information entered by :: LPinson, LPN  Past Medical History:  Diagnosis Date  . Arthritis   . Cardiomyopathy ([place])    a. 04/2017 Echo: EF 35-40%, diff HK, mild MR, mildly dil LA, nl RV fxn.  . Carotid stenosis    a. Bilateral 40-59%, rpt 1 yr  (02/2016); b. 03/2017 Carotid U/S: RICA 29-52%, LICA 8-41%, LKGM>01% - overall stable.  . Cervical spondylosis    s/p spine injections  . Depression   . Ectatic abdominal aorta ([place]) 2016   rpt [place] 5 yrs  . GERD (gastroesophageal reflux disease)   . History of chicken pox   . History of diverticulitis of colon   . HLD (hyperlipidemia)   . HTN (hypertension)   . Irritable bowel syndrome with constipation   . Lichen sclerosus et atrophicus   . Obesity   . Premature atrial contraction    a. 04/2017 48hr Holter: Predominant rhythm - sinus. Rare PVC's, freq PAC's with freq atrial runs up to 34 beats-->metoprolol started.  . Seasonal allergic rhinitis    Past Surgical History:  Procedure Laterality Date  . bladder tack  1990s  . BREAST BIOPSY Right 03/05/2012    benign  . CARPAL TUNNEL RELEASE  2004, 2013   bilateral  . CHOLECYSTECTOMY  1990s  . COLONOSCOPY  2002  . COLONOSCOPY  06/2013   mod diverticulosis, 3 tubular adenomas, int hem rpt 5 yrs [facility])  . DEXA  07/2011   normal, T score -0.3  . ESOPHAGOGASTRODUODENOSCOPY ENDOSCOPY  2004   normal [facility])  . TOTAL ABDOMINAL HYSTERECTOMY W/ BILATERAL SALPINGOOPHORECTOMY  1990s   complete, dysmenorrhea and fibroids   Family History  Problem Relation Age of Onset  .  CAD Mother 93       MI  . Hypertension Mother   . Parkinson's disease Father   . Diabetes Father   . CAD Father 56       MI  . Hyperlipidemia Sister   . Cancer Neg Hx   . Stroke Neg Hx   . Colon cancer Neg Hx   . Breast cancer Neg Hx    Social History   Socioeconomic History  . Marital status: Married    Spouse name: Not on file  . Number of children: Not on file  . Years of education: Not on file  . Highest education level: Not on file  Occupational History  . Not on file  Social Needs  . Financial resource strain: Not on file  . Food insecurity:    Worry: Not on file    Inability: Not on file  . Transportation needs:    Medical: Not on file     Non-medical: Not on file  Tobacco Use  . Smoking status: Former Smoker    Packs/day: 0.25    Years: 3.00    Pack years: 0.75    Types: Cigarettes    Last attempt to quit: 1990    Years since quitting: 29.6  . Smokeless tobacco: Never Used  . Tobacco comment: minimal smoking history  Substance and Sexual Activity  . Alcohol use: No    Alcohol/week: 0.0 standard drinks  . Drug use: No  . Sexual activity: Never  Lifestyle  . Physical activity:    Days per week: Not on file    Minutes per session: Not on file  . Stress: Not on file  Relationships  . Social connections:    Talks on phone: Not on file    Gets together: Not on file    Attends religious service: Not on file    Active member of club or organization: Not on file    Attends meetings of clubs or organizations: Not on file    Relationship status: Not on file  Other Topics Concern  . Not on file  Social History Narrative   Lives with husband and 2 cats.  2 grown children, 5 grandchildren.   Occupation: retired, worked in [employer]   Activity: no regular exercise.  Does walk in summer   Diet: fruits/vegetables daily, good water.    Outpatient Encounter Medications as of 03/06/2018  Medication Sig  . Acetaminophen (ARTHRITIS PAIN RELIEF PO) Take by mouth.  . Cholecalciferol (VITAMIN D3) 1000 UNITS CAPS Take 1 capsule (1,000 Units total) by mouth daily.  . clonazePAM (KLONOPIN) 1 MG tablet TAKE 1 TABLET BY MOUTH TWICE DAILY AS NEEDED  . Docusate Calcium (STOOL SOFTENER PO) Take by mouth daily.   Marland Kitchen gabapentin (NEURONTIN) 300 MG capsule TAKE 1 CAPSULE BY MOUTH EVERY MORNING AND 2 CAPSULES BY MOUTH EVERY NIGHT AT BEDTIME  . lisinopril (PRINIVIL,ZESTRIL) 2.5 MG tablet TAKE 1 TABLET BY MOUTH ONCE DAILY  . MAGNESIUM CITRATE PO Take by mouth.  . metoprolol succinate (TOPROL XL) 25 MG 24 hr tablet Take 0.5 tablets (12.5 mg total) by mouth daily.  . Misc Natural Products (SINUS FORMULA PO) Take by mouth.  . Multiple Vitamin  (MULTIVITAMIN) tablet Take 1 tablet by mouth daily.  . Omega-3 Fatty Acids (FISH OIL) 1000 MG CAPS Take one by mouth two times a day  . pantoprazole (PROTONIX) 40 MG tablet TAKE 1 TABLET BY MOUTH ONCE DAILY  . sertraline (ZOLOFT) 100 MG tablet TAKE 1 TABLET BY  MOUTH ONCE DAILY  . traMADol (ULTRAM) 50 MG tablet TAKE 1 TABLET BY MOUTH THREE TIMES DAILY AS NEEDED  . vitamin B-12 (CYANOCOBALAMIN) 1000 MCG tablet Take 1,000 mcg by mouth daily.  . [DISCONTINUED] triamcinolone cream (KENALOG) 0.1 % Apply 1 application topically 2 (two) times daily. To affected area   No facility-administered encounter medications on file as of 03/06/2018.     Activities of Daily Living In your present state of health, do you have any difficulty performing the following activities: 03/06/2018  Hearing? N  Vision? N  Difficulty concentrating or making decisions? N  Walking or climbing stairs? N  Dressing or bathing? N  Doing errands, shopping? N  Preparing Food and eating ? N  Using the Toilet? N  In the past six months, have you accidently leaked urine? N  Do you have problems with loss of bowel control? N  Managing your Medications? N  Managing your Finances? N  Housekeeping or managing your Housekeeping? N  Some recent data might be hidden    Patient Care Team: Ria Bush, MD as PCP - General (Family Medicine) Jovita Gamma, MD as Consulting Physician (Neurosurgery) Neil Crouch, OD as Referring Physician (Optometry)    Assessment:   This is a routine wellness examination for [facility].   Hearing Screening   125Hz  250Hz  500Hz  1000Hz  2000Hz  3000Hz  4000Hz  6000Hz  8000Hz   Right ear:   40 40 40  40    Left ear:   40 40 40  40    Vision Screening Comments: Feb 2019 with Dr. Aurther Loft    Exercise Activities and Dietary recommendations Current Exercise Habits: The patient does not participate in regular exercise at present, Exercise limited by: None identified  Goals    . DIET - INCREASE  WATER INTAKE     Starting 03/06/2018, I will attempt to drink at least 6-8 glasses of water daily.        Fall Risk Fall Risk  03/06/2018 02/26/2017 02/21/2016 03/16/2015 08/28/2013  Falls in the past year? No No No No No   Depression Screen Depression screen [facility] 2/9 03/06/2018 02/26/2017 02/21/2016 03/16/2015 08/28/2013  Decreased Interest 0 0 0 0 0  Down, Depressed, Hopeless 0 0 0 0 0  PHQ - 2 Score 0 0 0 0 0  Altered sleeping 0 0 - - -  Tired, decreased energy 0 1 - - -  Change in appetite 0 0 - - -  Feeling bad or failure about yourself  0 0 - - -  Trouble concentrating 0 0 - - -  Moving slowly or fidgety/restless 0 0 - - -  Suicidal thoughts 0 0 - - -  PHQ-9 Score 0 1 - - -  Difficult doing work/chores Not difficult at all Somewhat difficult - - -     Cognitive Function MMSE - Mini Mental State Exam 02/26/2017 02/21/2016  Orientation to time 5 5  Orientation to Place 5 5  Registration 3 3  Attention/ Calculation 0 0  Recall 3 3  Language- name 2 objects 0 0  Language- repeat 1 1  Language- follow 3 step command 3 3  Language- read & follow direction 0 0  Write a sentence 0 0  Copy design 0 0  Total score 20 20     PLEASE NOTE: A Mini-Cog screen was completed. Maximum score is 20. A value of 0 denotes this part of [place] MMSE was not completed or the patient failed this part of the Mini-Cog screening.  Mini-Cog Screening Orientation to Time - Max 5 pts Orientation to Place - Max 5 pts Registration - Max 3 pts Recall - Max 3 pts Language Repeat - Max 1 pts Language Follow 3 Step Command - Max 3 pts    Immunization History  Administered Date(s) Administered  . Influenza,inj,Quad PF,6+ Mos 03/16/2015  . Influenza-Unspecified 04/15/2013, 04/15/2014, 04/16/2016, 04/15/2017  . Pneumococcal Conjugate-13 08/28/2013  . Pneumococcal Polysaccharide-23 08/27/2012  . Tdap 08/27/2012  . Zoster 07/16/2010   Screening Tests Health Maintenance  Topic Date Due  . INFLUENZA  VACCINE  10/15/2018 (Originally 02/13/2018)  . COLONOSCOPY  07/02/2018  . MAMMOGRAM  10/24/2018  . DTaP/Tdap/Td (2 - Td) 08/27/2022  . TETANUS/TDAP  08/27/2022  . DEXA SCAN  Completed  . Hepatitis C Screening  Completed  . PNA vac Low Risk Adult  Completed      Plan:   I have personally reviewed, addressed, and noted the following in the patient's chart:  A. Medical and social history B. Use of alcohol, tobacco or illicit drugs  C. Current medications and supplements D. Functional ability and status E.  Nutritional status F.  Physical activity G. Advance directives H. List of other physicians I.  Hospitalizations, surgeries, and ER visits in previous 12 months J.  [place] to include hearing, vision, cognitive, depression L. Referrals and appointments - none  In addition, I have reviewed and discussed with patient certain preventive protocols, quality metrics, and best practice recommendations. A written personalized care plan for preventive services as well as general preventive health recommendations were provided to patient.  See attached scanned questionnaire for additional information.   Signed,   Lindell Noe, MHA, BS, LPN Health Coach

## 2018-03-06 NOTE — Progress Notes (Signed)
PCP notes:   Health maintenance:  Flu vaccine - addressed  Abnormal screenings:   None  Patient concerns:   None  Nurse concerns:  None  Next PCP appt:   03/13/18 @ 0930

## 2018-03-08 ENCOUNTER — Encounter: Payer: Self-pay | Admitting: Family Medicine

## 2018-03-13 ENCOUNTER — Encounter: Payer: Self-pay | Admitting: Family Medicine

## 2018-03-13 ENCOUNTER — Ambulatory Visit (INDEPENDENT_AMBULATORY_CARE_PROVIDER_SITE_OTHER): Payer: Medicare HMO | Admitting: Family Medicine

## 2018-03-13 VITALS — BP 120/72 | HR 49 | Temp 97.8°F | Ht 61.5 in | Wt 172.5 lb

## 2018-03-13 DIAGNOSIS — E7849 Other hyperlipidemia: Secondary | ICD-10-CM

## 2018-03-13 DIAGNOSIS — D649 Anemia, unspecified: Secondary | ICD-10-CM

## 2018-03-13 DIAGNOSIS — K219 Gastro-esophageal reflux disease without esophagitis: Secondary | ICD-10-CM

## 2018-03-13 DIAGNOSIS — N183 Chronic kidney disease, stage 3 unspecified: Secondary | ICD-10-CM

## 2018-03-13 DIAGNOSIS — E669 Obesity, unspecified: Secondary | ICD-10-CM | POA: Diagnosis not present

## 2018-03-13 DIAGNOSIS — Z7189 Other specified counseling: Secondary | ICD-10-CM

## 2018-03-13 DIAGNOSIS — F331 Major depressive disorder, recurrent, moderate: Secondary | ICD-10-CM

## 2018-03-13 DIAGNOSIS — I10 Essential (primary) hypertension: Secondary | ICD-10-CM | POA: Diagnosis not present

## 2018-03-13 DIAGNOSIS — I6523 Occlusion and stenosis of bilateral carotid arteries: Secondary | ICD-10-CM

## 2018-03-13 DIAGNOSIS — I77811 Abdominal aortic ectasia: Secondary | ICD-10-CM | POA: Diagnosis not present

## 2018-03-13 DIAGNOSIS — R69 Illness, unspecified: Secondary | ICD-10-CM | POA: Diagnosis not present

## 2018-03-13 DIAGNOSIS — Z0000 Encounter for general adult medical examination without abnormal findings: Secondary | ICD-10-CM | POA: Diagnosis not present

## 2018-03-13 LAB — IBC PANEL
Iron: 74 ug/dL (ref 42–145)
Saturation Ratios: 24.6 % (ref 20.0–50.0)
Transferrin: 215 mg/dL (ref 212.0–360.0)

## 2018-03-13 LAB — CBC WITH DIFFERENTIAL/PLATELET
Basophils Absolute: 0 10*3/uL (ref 0.0–0.1)
Basophils Relative: 0.6 % (ref 0.0–3.0)
Eosinophils Absolute: 0.2 10*3/uL (ref 0.0–0.7)
Eosinophils Relative: 4 % (ref 0.0–5.0)
HCT: 31.6 % — ABNORMAL LOW (ref 36.0–46.0)
Hemoglobin: 10.7 g/dL — ABNORMAL LOW (ref 12.0–15.0)
Lymphocytes Relative: 26.9 % (ref 12.0–46.0)
Lymphs Abs: 1.3 10*3/uL (ref 0.7–4.0)
MCHC: 33.9 g/dL (ref 30.0–36.0)
MCV: 89.1 fl (ref 78.0–100.0)
Monocytes Absolute: 0.2 10*3/uL (ref 0.1–1.0)
Monocytes Relative: 4 % (ref 3.0–12.0)
Neutro Abs: 3 10*3/uL (ref 1.4–7.7)
Neutrophils Relative %: 64.5 % (ref 43.0–77.0)
Platelets: 176 10*3/uL (ref 150.0–400.0)
RBC: 3.54 Mil/uL — ABNORMAL LOW (ref 3.87–5.11)
RDW: 13.5 % (ref 11.5–15.5)
WBC: 4.7 10*3/uL (ref 4.0–10.5)

## 2018-03-13 LAB — VITAMIN B12: Vitamin B-12: 1367 pg/mL — ABNORMAL HIGH (ref 211–911)

## 2018-03-13 LAB — FERRITIN: Ferritin: 161.7 ng/mL (ref 10.0–291.0)

## 2018-03-13 MED ORDER — ICOSAPENT ETHYL 1 G PO CAPS: 1.0000 g | ORAL_CAPSULE | Freq: Two times a day (BID) | ORAL | 3 refills | Status: DC

## 2018-03-13 NOTE — Assessment & Plan Note (Signed)
Advanced directives: has set up through lawyer. Husband then daughter are HCPOA. Asked to bring me copy. 

## 2018-03-13 NOTE — Assessment & Plan Note (Signed)
Deterioration noted. Update SPEP.

## 2018-03-13 NOTE — Assessment & Plan Note (Signed)
Preventative protocols reviewed and updated unless pt declined. Discussed healthy diet and lifestyle.  

## 2018-03-13 NOTE — Patient Instructions (Addendum)
You are due end of this year for repeat colonoscopy.  Return next month for flu clinic.  Repeat labs today for further evaluation of anemia and kidneys.  Decrease protonix to every other day and monitor heartburn symptoms. Price out vascepa 1 capsule twice daily with meals in place of fish oil.  We will refer you to cholesterol clinic for further cholesterol treatment options.   Health Maintenance, Female Adopting a healthy lifestyle and getting preventive care can go a long way to promote health and wellness. Talk with your health care provider about what schedule of regular examinations is right for you. This is a good chance for you to check in with your provider about disease prevention and staying healthy. In between checkups, there are plenty of things you can do on your own. Experts have done a lot of research about which lifestyle changes and preventive measures are most likely to keep you healthy. Ask your health care provider for more information. Weight and diet Eat a healthy diet  Be sure to include plenty of vegetables, fruits, low-fat dairy products, and lean protein.  Do not eat a lot of foods high in solid fats, added sugars, or salt.  Get regular exercise. This is one of the most important things you can do for your health. ? Most adults should exercise for at least 150 minutes each week. The exercise should increase your heart rate and make you sweat (moderate-intensity exercise). ? Most adults should also do strengthening exercises at least twice a week. This is in addition to the moderate-intensity exercise.  Maintain a healthy weight  Body mass index (BMI) is a measurement that can be used to identify possible weight problems. It estimates body fat based on height and weight. Your health care provider can help determine your BMI and help you achieve or maintain a healthy weight.  For females 53 years of age and older: ? A BMI below 18.5 is considered underweight. ? A  BMI of 18.5 to 24.9 is normal. ? A BMI of 25 to 29.9 is considered overweight. ? A BMI of 30 and above is considered obese.  Watch levels of cholesterol and blood lipids  You should start having your blood tested for lipids and cholesterol at 71 years of age, then have this test every 5 years.  You may need to have your cholesterol levels checked more often if: ? Your lipid or cholesterol levels are high. ? You are older than 71 years of age. ? You are at high risk for heart disease.  Cancer screening Lung Cancer  Lung cancer screening is recommended for adults 56-78 years old who are at high risk for lung cancer because of a history of smoking.  A yearly low-dose CT scan of the lungs is recommended for people who: ? Currently smoke. ? Have quit within the past 15 years. ? Have at least a 30-pack-year history of smoking. A pack year is smoking an average of one pack of cigarettes a day for 1 year.  Yearly screening should continue until it has been 15 years since you quit.  Yearly screening should stop if you develop a health problem that would prevent you from having lung cancer treatment.  Breast Cancer  Practice breast self-awareness. This means understanding how your breasts normally appear and feel.  It also means doing regular breast self-exams. Let your health care provider know about any changes, no matter how small.  If you are in your 20s or 30s, you should  have a clinical breast exam (CBE) by a health care provider every 1-3 years as part of a regular health exam.  If you are 46 or older, have a CBE every year. Also consider having a breast X-ray (mammogram) every year.  If you have a family history of breast cancer, talk to your health care provider about genetic screening.  If you are at high risk for breast cancer, talk to your health care provider about having an MRI and a mammogram every year.  Breast cancer gene (BRCA) assessment is recommended for women who  have family members with BRCA-related cancers. BRCA-related cancers include: ? Breast. ? Ovarian. ? Tubal. ? Peritoneal cancers.  Results of the assessment will determine the need for genetic counseling and BRCA1 and BRCA2 testing.  Cervical Cancer Your health care provider may recommend that you be screened regularly for cancer of the pelvic organs (ovaries, uterus, and vagina). This screening involves a pelvic examination, including checking for microscopic changes to the surface of your cervix (Pap test). You may be encouraged to have this screening done every 3 years, beginning at age 62.  For women ages 69-65, health care providers may recommend pelvic exams and Pap testing every 3 years, or they may recommend the Pap and pelvic exam, combined with testing for human papilloma virus (HPV), every 5 years. Some types of HPV increase your risk of cervical cancer. Testing for HPV may also be done on women of any age with unclear Pap test results.  Other health care providers may not recommend any screening for nonpregnant women who are considered low risk for pelvic cancer and who do not have symptoms. Ask your health care provider if a screening pelvic exam is right for you.  If you have had past treatment for cervical cancer or a condition that could lead to cancer, you need Pap tests and screening for cancer for at least 20 years after your treatment. If Pap tests have been discontinued, your risk factors (such as having a new sexual partner) need to be reassessed to determine if screening should resume. Some women have medical problems that increase the chance of getting cervical cancer. In these cases, your health care provider may recommend more frequent screening and Pap tests.  Colorectal Cancer  This type of cancer can be detected and often prevented.  Routine colorectal cancer screening usually begins at 71 years of age and continues through 71 years of age.  Your health care  provider may recommend screening at an earlier age if you have risk factors for colon cancer.  Your health care provider may also recommend using home test kits to check for hidden blood in the stool.  A small camera at the end of a tube can be used to examine your colon directly (sigmoidoscopy or colonoscopy). This is done to check for the earliest forms of colorectal cancer.  Routine screening usually begins at age 74.  Direct examination of the colon should be repeated every 5-10 years through 71 years of age. However, you may need to be screened more often if early forms of precancerous polyps or small growths are found.  Skin Cancer  Check your skin from head to toe regularly.  Tell your health care provider about any new moles or changes in moles, especially if there is a change in a mole's shape or color.  Also tell your health care provider if you have a mole that is larger than the size of a pencil eraser.  Always  use sunscreen. Apply sunscreen liberally and repeatedly throughout the day.  Protect yourself by wearing long sleeves, pants, a wide-brimmed hat, and sunglasses whenever you are outside.  Heart disease, diabetes, and high blood pressure  High blood pressure causes heart disease and increases the risk of stroke. High blood pressure is more likely to develop in: ? People who have blood pressure in the high end of the normal range (130-139/85-89 mm Hg). ? People who are overweight or obese. ? People who are African American.  If you are 35-48 years of age, have your blood pressure checked every 3-5 years. If you are 51 years of age or older, have your blood pressure checked every year. You should have your blood pressure measured twice-once when you are at a hospital or clinic, and once when you are not at a hospital or clinic. Record the average of the two measurements. To check your blood pressure when you are not at a hospital or clinic, you can use: ? An automated  blood pressure machine at a pharmacy. ? A home blood pressure monitor.  If you are between 4 years and 44 years old, ask your health care provider if you should take aspirin to prevent strokes.  Have regular diabetes screenings. This involves taking a blood sample to check your fasting blood sugar level. ? If you are at a normal weight and have a low risk for diabetes, have this test once every three years after 71 years of age. ? If you are overweight and have a high risk for diabetes, consider being tested at a younger age or more often. Preventing infection Hepatitis B  If you have a higher risk for hepatitis B, you should be screened for this virus. You are considered at high risk for hepatitis B if: ? You were born in a country where hepatitis B is common. Ask your health care provider which countries are considered high risk. ? Your parents were born in a high-risk country, and you have not been immunized against hepatitis B (hepatitis B vaccine). ? You have HIV or AIDS. ? You use needles to inject street drugs. ? You live with someone who has hepatitis B. ? You have had sex with someone who has hepatitis B. ? You get hemodialysis treatment. ? You take certain medicines for conditions, including cancer, organ transplantation, and autoimmune conditions.  Hepatitis C  Blood testing is recommended for: ? Everyone born from [birth year] through 1964. ? Anyone with known risk factors for hepatitis C.  Sexually transmitted infections (STIs)  You should be screened for sexually transmitted infections (STIs) including gonorrhea and chlamydia if: ? You are sexually active and are younger than 71 years of age. ? You are older than 71 years of age and your health care provider tells you that you are at risk for this type of infection. ? Your sexual activity has changed since you were last screened and you are at an increased risk for chlamydia or gonorrhea. Ask your health care provider if you  are at risk.  If you do not have HIV, but are at risk, it may be recommended that you take a prescription medicine daily to prevent HIV infection. This is called pre-exposure prophylaxis (PrEP). You are considered at risk if: ? You are sexually active and do not regularly use condoms or know the HIV status of your partner(s). ? You take drugs by injection. ? You are sexually active with a partner who has HIV.  Talk with your  health care provider about whether you are at high risk of being infected with HIV. If you choose to begin PrEP, you should first be tested for HIV. You should then be tested every 3 months for as long as you are taking PrEP. Pregnancy  If you are premenopausal and you may become pregnant, ask your health care provider about preconception counseling.  If you may become pregnant, take 400 to 800 micrograms (mcg) of folic acid every day.  If you want to prevent pregnancy, talk to your health care provider about birth control (contraception). Osteoporosis and menopause  Osteoporosis is a disease in which the bones lose minerals and strength with aging. This can result in serious bone fractures. Your risk for osteoporosis can be identified using a bone density scan.  If you are 60 years of age or older, or if you are at risk for osteoporosis and fractures, ask your health care provider if you should be screened.  Ask your health care provider whether you should take a calcium or vitamin D supplement to lower your risk for osteoporosis.  Menopause may have certain physical symptoms and risks.  Hormone replacement therapy may reduce some of these symptoms and risks. Talk to your health care provider about whether hormone replacement therapy is right for you. Follow these instructions at home:  Schedule regular health, dental, and eye exams.  Stay current with your immunizations.  Do not use any tobacco products including cigarettes, chewing tobacco, or electronic  cigarettes.  If you are pregnant, do not drink alcohol.  If you are breastfeeding, limit how much and how often you drink alcohol.  Limit alcohol intake to no more than 1 drink per day for nonpregnant women. One drink equals 12 ounces of beer, 5 ounces of wine, or 1 ounces of hard liquor.  Do not use street drugs.  Do not share needles.  Ask your health care provider for help if you need support or information about quitting drugs.  Tell your health care provider if you often feel depressed.  Tell your health care provider if you have ever been abused or do not feel safe at home. This information is not intended to replace advice given to you by your health care provider. Make sure you discuss any questions you have with your health care provider. Document Released: 01/15/2011 Document Revised: 12/08/2015 Document Reviewed: 04/05/2015 Elsevier Interactive Patient Education  [employer].

## 2018-03-13 NOTE — Progress Notes (Signed)
BP 120/72 (BP Location: Left Arm, Patient Position: Sitting, Cuff Size: Large)   Pulse (!) 49   Temp 97.8 F (36.6 C) (Oral)   Ht 5' 1.5" (1.562 m)   Wt 172 lb 8 oz (78.2 kg)   SpO2 98%   BMI 32.07 kg/m    CC: CPE Subjective:    Patient ID: Ann White, female    DOB: [birth year], 72 y.o.   MRN: [id number]  HPI: Ann White is a 72 y.o. female presenting on 03/13/2018 for Annual Exam (Pt 2. Pt accompained by her husband.)   [name] last week for medicare wellness visit. Note reviewed.  Asymptomatic bradycardia.   Preventative: Colon cancer screening - colonoscopy 06/2013 mod diverticulosis, 3 tubular adenomas, int hem rpt 5 yrs [facility] Plan)  Well woman - with prior PCP Dr. Rocky Link. S/p hysterectomy and oophorectomy for benign reason. 1990s.  Mammogram - 10/2017 DEXA 2013 - WNL Flu shot yearly Pneumovax 2014. prevnar 08/2013 Tdap 2014 zostavax 2012.  shingrix - discussed - high deductible so would be expensive.  Advanced directives: has set up through lawyer. Husband then daughter are HCPOA. Asked to bring me copy. Seat belt use discussed Sunscreen use discussed. No changing moles on skin.  Non smokers  No EtOH use.  Dentist Q6 mo Eye exam yearly  Lives with husband and 2 cats. 2 grown children, 5 grandchildren  Occupation: retired, worked in [employer]  [facility]  Diet: fruits/vegetables daily, good water   Relevant past medical, surgical, family and social history reviewed and updated as indicated. Interim medical history since our last visit reviewed. Allergies and medications reviewed and updated. Outpatient Medications Prior to Visit  Medication Sig Dispense Refill  . Acetaminophen (ARTHRITIS PAIN RELIEF PO) Take by mouth.    . Cholecalciferol (VITAMIN D3) 1000 UNITS CAPS Take 1 capsule (1,000 Units total) by mouth daily.    . clonazePAM (KLONOPIN) 1 MG tablet TAKE 1 TABLET BY MOUTH TWICE DAILY AS NEEDED 60 tablet 3  . Docusate Calcium (STOOL  SOFTENER PO) Take by mouth daily.     Marland Kitchen gabapentin (NEURONTIN) 300 MG capsule TAKE 1 CAPSULE BY MOUTH EVERY MORNING AND 2 CAPSULES BY MOUTH EVERY NIGHT AT BEDTIME 270 capsule 2  . lisinopril (PRINIVIL,ZESTRIL) 2.5 MG tablet TAKE 1 TABLET BY MOUTH ONCE DAILY 90 tablet 1  . MAGNESIUM CITRATE PO Take by mouth.    . metoprolol succinate (TOPROL XL) 25 MG 24 hr tablet Take 0.5 tablets (12.5 mg total) by mouth daily. 45 tablet 3  . Misc Natural Products (SINUS FORMULA PO) Take by mouth.    . Multiple Vitamin (MULTIVITAMIN) tablet Take 1 tablet by mouth daily.    . Omega-3 Fatty Acids (FISH OIL) 1000 MG CAPS Take one by mouth two times a day    . pantoprazole (PROTONIX) 40 MG tablet Take 1 tablet (40 mg total) by mouth every other day. 90 tablet 0  . sertraline (ZOLOFT) 100 MG tablet TAKE 1 TABLET BY MOUTH ONCE DAILY 90 tablet 1  . traMADol (ULTRAM) 50 MG tablet TAKE 1 TABLET BY MOUTH THREE TIMES DAILY AS NEEDED 120 tablet 0  . vitamin B-12 (CYANOCOBALAMIN) 1000 MCG tablet Take 1,000 mcg by mouth daily.    . pantoprazole (PROTONIX) 40 MG tablet TAKE 1 TABLET BY MOUTH ONCE DAILY 90 tablet 0   No facility-administered medications prior to visit.      Per HPI unless specifically indicated in ROS section below Review of Systems  Constitutional: Negative for activity change, appetite change, chills, fatigue, fever and unexpected weight change.  HENT: Negative for hearing loss.   Eyes: Negative for visual disturbance.  Respiratory: Negative for cough, chest tightness, shortness of breath and wheezing.   Cardiovascular: Negative for chest pain, palpitations and leg swelling.  Gastrointestinal: Positive for abdominal pain (recent viral stomach bug), constipation (alternating) and diarrhea (alternating). Negative for abdominal distention, blood in stool, nausea and vomiting.       H/o diverticulosis  Genitourinary: Negative for difficulty urinating and hematuria.  Musculoskeletal: Negative for  arthralgias, myalgias and neck pain.  Skin: Negative for rash.  Neurological: Positive for headaches. Negative for dizziness, seizures and syncope.  Hematological: Negative for adenopathy. Bruises/bleeds easily.  Psychiatric/Behavioral: Negative for dysphoric mood. The patient is not nervous/anxious.        Objective:    BP 120/72 (BP Location: Left Arm, Patient Position: Sitting, Cuff Size: Large)   Pulse (!) 49   Temp 97.8 F (36.6 C) (Oral)   Ht 5' 1.5" (1.562 m)   Wt 172 lb 8 oz (78.2 kg)   SpO2 98%   BMI 32.07 kg/m   Wt Readings from Last 3 Encounters:  03/13/18 172 lb 8 oz (78.2 kg)  03/06/18 168 lb 8 oz (76.4 kg)  12/11/17 167 lb 8 oz (76 kg)    Physical Exam  Constitutional: She is oriented to person, place, and time. She appears well-developed and well-nourished. No distress.  HENT:  Head: Normocephalic and atraumatic.  Right Ear: Hearing, tympanic membrane, external ear and ear canal normal.  Left Ear: Hearing, tympanic membrane, external ear and ear canal normal.  Nose: Nose normal.  Mouth/Throat: Uvula is midline, oropharynx is clear and moist and mucous membranes are normal. No oropharyngeal exudate, posterior oropharyngeal edema or posterior oropharyngeal erythema.  Eyes: Pupils are equal, round, and reactive to light. Conjunctivae and EOM are normal. No scleral icterus.  Neck: Normal range of motion. Neck supple. Carotid bruit is not present. No thyromegaly present.  Cardiovascular: Normal rate, regular rhythm, normal heart sounds and intact distal pulses.  No murmur heard. Pulses:      Radial pulses are 2+ on the right side, and 2+ on the left side.  Pulmonary/Chest: Effort normal and breath sounds normal. No respiratory distress. She has no wheezes. She has no rales.  Abdominal: Soft. Bowel sounds are normal. She exhibits no distension and no mass. There is no tenderness. There is no rebound and no guarding.  Musculoskeletal: Normal range of motion. She  exhibits no edema.  Lymphadenopathy:    She has no cervical adenopathy.  Neurological: She is alert and oriented to person, place, and time.  CN grossly intact, station and gait intact  Skin: Skin is warm and dry. No rash noted.  Psychiatric: She has a normal mood and affect. Her behavior is normal. Judgment and thought content normal.  Nursing note and vitals reviewed.  Results for orders placed or performed in visit on 03/06/18  VITAMIN D 25 Hydroxy (Vit-D Deficiency, Fractures)  Result Value Ref Range   VITD 37.31 30.00 - 100.00 ng/mL  CBC with Differential/Platelet  Result Value Ref Range   WBC 4.4 4.0 - 10.5 K/uL   RBC 3.57 (L) 3.87 - 5.11 Mil/uL   Hemoglobin 10.9 (L) 12.0 - 15.0 g/dL   HCT 31.9 (L) 36.0 - 46.0 %   MCV 89.4 78.0 - 100.0 fl   MCHC 34.0 30.0 - 36.0 g/dL   RDW 13.9 11.5 - 15.5 %  Platelets 169.0 150.0 - 400.0 K/uL   Neutrophils Relative % 49.6 43.0 - 77.0 %   Lymphocytes Relative 39.7 12.0 - 46.0 %   Monocytes Relative 4.9 3.0 - 12.0 %   Eosinophils Relative 5.2 (H) 0.0 - 5.0 %   Basophils Relative 0.6 0.0 - 3.0 %   Neutro Abs 2.2 1.4 - 7.7 K/uL   Lymphs Abs 1.8 0.7 - 4.0 K/uL   Monocytes Absolute 0.2 0.1 - 1.0 K/uL   Eosinophils Absolute 0.2 0.0 - 0.7 K/uL   Basophils Absolute 0.0 0.0 - 0.1 K/uL  Comprehensive metabolic panel  Result Value Ref Range   Sodium 140 135 - 145 mEq/L   Potassium 4.6 3.5 - 5.1 mEq/L   Chloride 103 96 - 112 mEq/L   CO2 29 19 - 32 mEq/L   Glucose, Bld 97 70 - 99 mg/dL   BUN 29 (H) 6 - 23 mg/dL   Creatinine, Ser 1.26 (H) 0.40 - 1.20 mg/dL   Total Bilirubin 0.4 0.2 - 1.2 mg/dL   Alkaline Phosphatase 39 39 - 117 U/L   AST 16 0 - 37 U/L   ALT 10 0 - 35 U/L   Total Protein 7.5 6.0 - 8.3 g/dL   Albumin 4.5 3.5 - 5.2 g/dL   Calcium 9.9 8.4 - 10.5 mg/dL   GFR 44.51 (L) >60.00 mL/min  Lipid panel  Result Value Ref Range   Cholesterol 314 (H) 0 - 200 mg/dL   Triglycerides 240.0 (H) 0.0 - 149.0 mg/dL   HDL 46.00 >39.00 mg/dL    VLDL 48.0 (H) 0.0 - 40.0 mg/dL   Total CHOL/HDL Ratio 7    NonHDL 268.03   LDL cholesterol, direct  Result Value Ref Range   Direct LDL 217.0 mg/dL      Assessment & Plan:   Problem List Items Addressed This Visit    Obesity, Class I, BMI 30-34.9    [facility]      MDD (major depressive disorder), recurrent episode, moderate (HCC)    Stable period on sertraline and klonopin.       HTN (hypertension)    Chronic, stable on low dose lisinopril and beta blocker.      Relevant Medications   Icosapent Ethyl (VASCEPA) 1 g CAPS   Health maintenance examination - Primary    Preventative protocols reviewed and updated unless pt declined. Discussed healthy diet and lifestyle.       GERD (gastroesophageal reflux disease)    Stable, no symptoms on protonix. rec decrease PPI to QOD dosing.       Relevant Medications   pantoprazole (PROTONIX) 40 MG tablet   Familial combined hyperlipidemia    Intolerant to statins, zetia, tricor. Will price out vascepa in place of fish oil. Would benefit from PCSK9 inhibitor - will refer to lipid clinic for further eval. The 10-year ASCVD risk score Mikey Bussing DC Brooke Bonito., et al., 2013) is: 13.1%   Values used to calculate the score:     Age: 72 years     Sex: Female     Is Non-Hispanic African American: No     Diabetic: No     Tobacco smoker: No     Systolic Blood Pressure: 409 mmHg     Is BP treated: Yes     HDL Cholesterol: 46 mg/dL     Total Cholesterol: 314 mg/dL       Relevant Medications   Icosapent Ethyl (VASCEPA) 1 g CAPS   Other Relevant  Orders   AMB Referral to [facility]   Ectatic abdominal aorta ([place])    F/u US due 2021      Relevant Medications   Icosapent Ethyl (VASCEPA) 1 g CAPS   CKD (chronic kidney disease) stage 3, GFR 30-59 ml/min (HCC)    Deterioration noted. Update SPEP.       Relevant Orders   Serum protein electrophoresis with reflex   Carotid stenosis     Not appreciated on exam. Carotid US stable last 03/2017      Relevant Medications   Icosapent Ethyl (VASCEPA) 1 g CAPS   Anemia, unspecified    Progressive. Update anemia panel today.       Relevant Orders   Vitamin B12   Ferritin   IBC panel   Pathologist smear review   Advanced care planning/counseling discussion    Advanced directives: has set up through lawyer. Husband then daughter are HCPOA. Asked to bring me copy.          Meds ordered this encounter  Medications  . Icosapent Ethyl (VASCEPA) 1 g CAPS    Sig: Take 1 capsule (1 g total) by mouth 2 (two) times daily.    Dispense:  60 capsule    Refill:  3   Orders Placed This Encounter  Procedures  . Vitamin B12  . Ferritin  . IBC panel  . Serum protein electrophoresis with reflex  . Pathologist smear review  . AMB Referral to [facility]    Referral Priority:   Routine    Referral Type:   Consultation    Referral Reason:   Specialty Services Required    Number of Visits Requested:   1    Follow up plan: Return in about 6 months (around 09/13/2018).  Ria Bush, MD

## 2018-03-13 NOTE — Assessment & Plan Note (Signed)
[facility]

## 2018-03-13 NOTE — Assessment & Plan Note (Signed)
Progressive. Update anemia panel today.

## 2018-03-13 NOTE — Addendum Note (Signed)
Addended by: Lendon Collar on: 03/13/2018 10:38 AM   Modules accepted: Orders

## 2018-03-13 NOTE — Assessment & Plan Note (Signed)
Not appreciated on exam. Carotid US stable last 03/2017

## 2018-03-13 NOTE — Assessment & Plan Note (Signed)
Stable, no symptoms on protonix. rec decrease PPI to QOD dosing.

## 2018-03-13 NOTE — Assessment & Plan Note (Addendum)
Intolerant to statins, zetia, tricor. Will price out vascepa in place of fish oil. Would benefit from PCSK9 inhibitor - will refer to lipid clinic for further eval. The 10-year ASCVD risk score Mikey Bussing DC Brooke Bonito., et al., 2013) is: 13.1%   Values used to calculate the score:     Age: 72 years     Sex: Female     Is Non-Hispanic African American: No     Diabetic: No     Tobacco smoker: No     Systolic Blood Pressure: 542 mmHg     Is BP treated: Yes     HDL Cholesterol: 46 mg/dL     Total Cholesterol: 314 mg/dL

## 2018-03-13 NOTE — Assessment & Plan Note (Signed)
Stable period on sertraline and klonopin.  

## 2018-03-13 NOTE — Assessment & Plan Note (Addendum)
Chronic, stable on low dose lisinopril and beta blocker.

## 2018-03-13 NOTE — Assessment & Plan Note (Signed)
F/u US due 2021

## 2018-03-14 LAB — PATHOLOGIST SMEAR REVIEW

## 2018-03-17 ENCOUNTER — Other Ambulatory Visit: Payer: Self-pay | Admitting: Family Medicine

## 2018-03-18 LAB — PROTEIN ELECTROPHORESIS, SERUM, WITH REFLEX
Albumin ELP: 4.4 g/dL (ref 3.8–4.8)
Alpha 1: 0.3 g/dL (ref 0.2–0.3)
Alpha 2: 0.8 g/dL (ref 0.5–0.9)
Beta 2: 0.4 g/dL (ref 0.2–0.5)
Beta Globulin: 0.4 g/dL (ref 0.4–0.6)
Gamma Globulin: 0.8 g/dL (ref 0.8–1.7)
Total Protein: 7.1 g/dL (ref 6.1–8.1)

## 2018-03-20 NOTE — Telephone Encounter (Signed)
Copied from [place]. Topic: Quick Communication - Rx Refill/Question >> Mar 20, 2018  9:27 AM Scherrie Gerlach wrote: Medication: pantoprazole (PROTONIX) 40 MG tablet  Has the patient contacted their pharmacy?  yes pt states the pharmacy does not have this med.  No confirmation it went to [facility].  [facility] [address], [place] [phone] (Phone) [phone] (Fax)

## 2018-03-24 MED ORDER — PANTOPRAZOLE SODIUM 40 MG PO TBEC: 40.0000 mg | DELAYED_RELEASE_TABLET | Freq: Every day | ORAL | 1 refills | Status: DC

## 2018-03-24 NOTE — Telephone Encounter (Signed)
Left message on vm for pt to call back.   Need to notify her pantoprazole was sent to the pharmacy again.

## 2018-03-24 NOTE — Telephone Encounter (Addendum)
plz notify this was sent to [facility] again.

## 2018-03-24 NOTE — Telephone Encounter (Signed)
Last OV 03/13/2018. Medication marked as Hx. pls advise

## 2018-03-24 NOTE — Addendum Note (Signed)
Addended by: Ria Bush on: 03/24/2018 02:32 PM   Modules accepted: Orders

## 2018-03-24 NOTE — Telephone Encounter (Signed)
Patient calling and states that her pharmacy states that they have not received the pantoprazole (PROTONIX) 40 MG tablet  Prescription. Please advise.[facility] [place], [place]

## 2018-03-27 ENCOUNTER — Other Ambulatory Visit: Payer: Self-pay | Admitting: Family Medicine

## 2018-03-27 DIAGNOSIS — N183 Chronic kidney disease, stage 3 unspecified: Secondary | ICD-10-CM

## 2018-03-27 DIAGNOSIS — D631 Anemia in chronic kidney disease: Secondary | ICD-10-CM

## 2018-03-27 MED ORDER — VITAMIN B-12 500 MCG PO TABS: 500.0000 ug | ORAL_TABLET | Freq: Every day | ORAL | Status: DC

## 2018-03-27 NOTE — Telephone Encounter (Signed)
Spoke with pt asking if she received the pantoprazole. Confirms she did get it and expresses her thanks.

## 2018-04-10 DIAGNOSIS — R69 Illness, unspecified: Secondary | ICD-10-CM | POA: Diagnosis not present

## 2018-04-10 NOTE — Progress Notes (Signed)
I reviewed health advisor's note, was available for consultation, and agree with documentation and plan.  

## 2018-04-13 NOTE — Progress Notes (Signed)
Patient ID: Ann White                 DOB: 03/14/1946                    MRN: [id number]     HPI: Ann White is a 72 y.o. female patient of Dr End referred to lipid clinic by PCP Dr. Danise White. PMH is significant for CAD with 40-59% RICA stenosis and 4-09% LICA stenosis, hypertension, depression, cardiomyopathy, (EF 35-40%), obesity, GERD, CKD, and HLD. She was last seen ~1 month ago where Dr. Danise White prescribed Vascepa. She did not pick up Vascepa or start taking it, copay will preclude use at $150 per month. She did recently switch to taking fish oil every other day, per patient as instructed by Dr. Danise White. She also reports losing 25 pounds, partially due to decreased food intake. Reports she is slowly regaining the weight, but is cognizant of weight as she knows she needs to lose some weight for her health.   Patient with history of intolerances to multiple antihyperlipidemic agents:  fenofibrate (myalgia- hurting all over), Zetia (dizziness), Lipitor 10 mg (myalgia), lovastatin 10 mg three times a week (myalgia), pravastatin 20 and 40 mg (myalgia). She reports that with each medication she would try it for a couple week but eventually the myalgias would be too painful and she would stop. Symptoms improved with therapy discontinuation in each case.  Current Medications: fish oil 2000mg  every other day; wasn't aware to pick up vascepa Intolerances: fenofibrate (myalgia- hurting all over), Zetia (dizziness), Lipitor 10 mg (myalgia), lovastatin 10 mg three times a week (myalgia), pravastatin 20 and 40 mg (myalgias) Risk Factors: Family history, 10 year ASCVD risk 13.1%, LDL > 200, CAD with Carotid Doppler (03/25/17):  40-59% RICA stenosis. [id number],7-82% LICA stenosis.  LDL goal: < 70  Diet: Tries to eat vegetables, has been avoiding bread. Breakfast: eggs, bacon, 0.5 piece toast or cereal/milk or poptarts. Lunch: sandwich (meat or peanut butter); Dinner: vegetables, spaghetti, hamburgers,  potatoes, beans, cabbage. Ice cream three times a week.  Exercise: walking/working outside, push mower; at least 20 minutes most days. Was previously going to the gym, but lets have been painful and sore lately secondary to varicose veins.  Family History: Mother (MI at 57, HTN), Father (Parkinsons, diabetes, MI at 21), Sister (HLD)  Social History: former smoker, quit 29 years ago; (-) alcohol  Labs: 03/06/18: Total 314, LDL 217, nonHDL 268, TG 240, HDL 46 (no lipid lowering therapy) 02/26/17: Total 255, LDL 171, nonHDL 207, TG 217, HDL 47 (no lipid lowering therapy)  Past Medical History:  Diagnosis Date  . Arthritis   . Cardiomyopathy ([place])    a. 04/2017 Echo: EF 35-40%, diff HK, mild MR, mildly dil LA, nl RV fxn.  . Carotid stenosis    a. Bilateral 40-59%, rpt 1 yr (02/2016); b. 03/2017 Carotid U/S: RICA 95-62%, LICA 1-30%, QMVH>84% - overall stable.  . Cervical spondylosis    s/p spine injections  . Depression   . Ectatic abdominal aorta (Ann White) 2016   rpt [place] 5 yrs  . GERD (gastroesophageal reflux disease)   . History of chicken pox   . History of diverticulitis of colon   . HLD (hyperlipidemia)   . HTN (hypertension)   . Irritable bowel syndrome with constipation   . Lichen sclerosus et atrophicus   . Obesity   . Premature atrial contraction    a. 04/2017 48hr Holter: Predominant rhythm - sinus. Rare  PVC's, freq PAC's with freq atrial runs up to 34 beats-->metoprolol started.  . Seasonal allergic rhinitis     Current Outpatient Medications on File Prior to Visit  Medication Sig Dispense Refill  . Acetaminophen (ARTHRITIS PAIN RELIEF PO) Take by mouth.    . Cholecalciferol (VITAMIN D3) 1000 UNITS CAPS Take 1 capsule (1,000 Units total) by mouth daily.    . clonazePAM (KLONOPIN) 1 MG tablet TAKE 1 TABLET BY MOUTH TWICE DAILY AS NEEDED 60 tablet 3  . Docusate Calcium (STOOL SOFTENER PO) Take by mouth daily.     Marland Kitchen gabapentin (NEURONTIN) 300 MG capsule TAKE 1 CAPSULE BY MOUTH  EVERY MORNING AND 2 CAPSULES BY MOUTH EVERY NIGHT AT BEDTIME 270 capsule 2  . Icosapent Ethyl (VASCEPA) 1 g CAPS Take 1 capsule (1 g total) by mouth 2 (two) times daily. 60 capsule 3  . lisinopril (PRINIVIL,ZESTRIL) 2.5 MG tablet TAKE 1 TABLET BY MOUTH ONCE DAILY 90 tablet 1  . MAGNESIUM CITRATE PO Take by mouth.    . metoprolol succinate (TOPROL XL) 25 MG 24 hr tablet Take 0.5 tablets (12.5 mg total) by mouth daily. 45 tablet 3  . Misc Natural Products (SINUS FORMULA PO) Take by mouth.    . Multiple Vitamin (MULTIVITAMIN) tablet Take 1 tablet by mouth daily.    . Omega-3 Fatty Acids (FISH OIL) 1000 MG CAPS Take one by mouth two times a day    . pantoprazole (PROTONIX) 40 MG tablet Take 1 tablet (40 mg total) by mouth daily. 90 tablet 1  . sertraline (ZOLOFT) 100 MG tablet TAKE 1 TABLET BY MOUTH ONCE DAILY 90 tablet 1  . traMADol (ULTRAM) 50 MG tablet TAKE 1 TABLET BY MOUTH THREE TIMES DAILY AS NEEDED 120 tablet 0  . vitamin B-12 (CYANOCOBALAMIN) 500 MCG tablet Take 1 tablet (500 mcg total) by mouth daily.     No current facility-administered medications on file prior to visit.     Allergies  Allergen Reactions  . Sulfa Antibiotics Swelling    Causes mouth to swell  . Tricor [Fenofibrate] Other (See Comments)    myalgias  . Zetia [Ezetimibe] Other (See Comments)    Dizziness  . Contrast Media [Iodinated Diagnostic Agents] Other (See Comments)    Oral contrast caused mouth blisters  . Lipitor [Atorvastatin] Other (See Comments)    myalgias  . Pravastatin Other (See Comments)    myalgias    Assessment/Plan:  1. Hyperlipidemia - Pt would benefit from intensive lowering of LDL given current LDL > 200 and cardiovascular risk factors, including documented carotid stenosis and family history of CAD. LDL goal < 70. Encouraged patient to continue with healthy diet and encouraged patient to return to the gym as tolerable. Pt would benefit from PCSK9 inhibitor therapy given prior intolerances  to 3 statins and Zetia. Will submit information for Praluent to insurance company. Patient has filled out patient assistance forms, will submit pending copay from insurance company. Will also increase OTC fish oil back to 2g daily to target TG < 150.  Claiborne Billings, PharmD PGY2 Cardiology Pharmacy Resident 04/14/2018 1:58 PM

## 2018-04-14 ENCOUNTER — Ambulatory Visit (INDEPENDENT_AMBULATORY_CARE_PROVIDER_SITE_OTHER): Payer: Medicare HMO | Admitting: Pharmacist

## 2018-04-14 DIAGNOSIS — I6523 Occlusion and stenosis of bilateral carotid arteries: Secondary | ICD-10-CM | POA: Diagnosis not present

## 2018-04-14 DIAGNOSIS — E7849 Other hyperlipidemia: Secondary | ICD-10-CM

## 2018-04-14 NOTE — Patient Instructions (Addendum)
It was nice to meet you today.  Take your fish oil twice daily.  I will follow up with your insurance company and the assistance program to get the PCSK9 inhibitor approved (either Repatha or Praluent). We will call you with what we find out.  If you have any questions or concerns in the meantime, our clinic phone number is [phone].

## 2018-04-15 ENCOUNTER — Telehealth: Payer: Self-pay | Admitting: Pharmacist

## 2018-04-15 MED ORDER — ALIROCUMAB 75 MG/ML ~~LOC~~ SOPN: 1.0000 "pen " | PEN_INJECTOR | SUBCUTANEOUS | 11 refills | Status: DC

## 2018-04-15 NOTE — Telephone Encounter (Signed)
Praluent PA approved. Rx sent to pharmacy to assess copay.

## 2018-04-15 NOTE — Telephone Encounter (Signed)
Copay very cost prohibitive at $432 per month. Will submit application for [employer] patient assistance.

## 2018-04-17 NOTE — Telephone Encounter (Signed)
Pt assistance approved through P[employer] program through 07/15/18. Pt is aware and was provided with their phone #.

## 2018-04-21 ENCOUNTER — Telehealth: Payer: Self-pay | Admitting: Pharmacist

## 2018-04-21 DIAGNOSIS — E782 Mixed hyperlipidemia: Secondary | ICD-10-CM

## 2018-04-21 NOTE — Telephone Encounter (Signed)
Patient presented for Praluent injection training and successfully self-administered injection into her abdomen. Follow up lab work scheduled after 3 injections. Pt aware to contact P[employer] for next shipment in December. She has signed PASS application for 6168 year.

## 2018-04-24 ENCOUNTER — Ambulatory Visit (INDEPENDENT_AMBULATORY_CARE_PROVIDER_SITE_OTHER): Payer: Medicare HMO

## 2018-04-24 ENCOUNTER — Other Ambulatory Visit: Payer: Self-pay

## 2018-04-24 ENCOUNTER — Encounter: Payer: Self-pay | Admitting: Internal Medicine

## 2018-04-24 ENCOUNTER — Ambulatory Visit: Payer: Medicare HMO | Admitting: Internal Medicine

## 2018-04-24 VITALS — BP 126/74 | HR 54 | Ht 61.5 in | Wt 166.0 lb

## 2018-04-24 DIAGNOSIS — I6523 Occlusion and stenosis of bilateral carotid arteries: Secondary | ICD-10-CM | POA: Diagnosis not present

## 2018-04-24 DIAGNOSIS — I428 Other cardiomyopathies: Secondary | ICD-10-CM | POA: Diagnosis not present

## 2018-04-24 DIAGNOSIS — E785 Hyperlipidemia, unspecified: Secondary | ICD-10-CM | POA: Diagnosis not present

## 2018-04-24 DIAGNOSIS — I491 Atrial premature depolarization: Secondary | ICD-10-CM

## 2018-04-24 NOTE — Progress Notes (Signed)
Follow-up Outpatient Visit Date: 04/24/2018  Primary Care Provider: Ria Bush, MD [place] [address]  Chief Complaint: Follow-up cardiomyopathy  HPI:  Ann White is a 72 y.o. year-old female with history of non-ischemic cardiomyopathy diagnosed in 04/2017 (MPI without perfusion defects), carotid artery stenosis, abdominal aorta ectasia, right calf DVT, hypertension, hyperlipidemia, and chronic kidney disease, who presents for follow-up of cardiomyopathy.  I last saw Ann White in January, at which time she was feeling well other than minimal fatigue after addition of metoprolol.  We did not make any medication changes.  Today, Ann White reports that she feels a little better than at our prior visit, noting less fatigue.  She still gets somewhat tired when she is busy at home.  She denies chest pain, shortness of breath, palpitations, lightheadedness, and edema.  She is tolerating metoprolol succinate 12.5 mg daily.  She just started taking Praluent (first injection earlier this month), which she has tolerated well thus far.  --------------------------------------------------------------------------------------------------  Cardiovascular History & Procedures: Cardiovascular Problems:  Frequent PACs versus wandering pacemaker  Nonischemic cardiomyopathy  Carotid artery stenosis  Risk Factors:  Hypertension, hyperlipidemia, and age greater than 79  Cath/PCI:  None  CV Surgery:  None  EP Procedures and Devices:  48 hour Holter monitor (04/29/17): Sinus rhythm with frequent PACs and atrial runs, lasting up to 34 beats.  Non-Invasive Evaluation(s):  TTE (04/24/18): Normal LV size.  LVEF 55-60% with normal wall motion.  Grade 2 diastolic dysfunction.  Mild left atrial enlargement.  Normal RV size and function.  Mild pulmonary hypertension (PASP 41 mmHg).  Exercise MPI (06/12/17): No evidence of ischemia. LVEF 56%. Hypertensive blood pressure  response to exercise.  Echo (04/29/17): Normal LV size with moderately reduced contraction. LVEF 35-40% with global hypokinesis. Mild MR. Mild left atrial enlargement. Normal RV size and function. Normal PA pressure.  Carotid Doppler (03/25/17): Heterogeneous plaque, bilaterally. Stable, 40-59% RICA stenosis. IFOYDX,4-12% LICA stenosis. Stable, >50% RECA stenosis. Patent vertebral arteries with antegrade flow. Normal subclavian arteries, bilaterally.  Recent CV Pertinent Labs: Lab Results  Component Value Date   CHOL 314 (H) 03/06/2018   HDL 46.00 03/06/2018   LDLDIRECT 217.0 03/06/2018   TRIG 240.0 (H) 03/06/2018   CHOLHDL 7 03/06/2018   INR 1.5 09/22/2015   INR 7.0 Repeated and verified X2. ([place]) 06/13/2015   K 4.6 03/06/2018   MG 2.2 04/17/2017   BUN 29 (H) 03/06/2018   CREATININE 1.26 (H) 03/06/2018   CREATININE 1.19 07/26/2011    Past medical and surgical history were reviewed and updated in EPIC.  Current Meds  Medication Sig  . Acetaminophen (ARTHRITIS PAIN RELIEF PO) Take by mouth.  . Alirocumab (PRALUENT) 75 MG/ML SOPN Inject 1 pen into the skin every 14 (fourteen) days.  . Cholecalciferol (VITAMIN D3) 1000 UNITS CAPS Take 1 capsule (1,000 Units total) by mouth daily.  . clonazePAM (KLONOPIN) 1 MG tablet TAKE 1 TABLET BY MOUTH TWICE DAILY AS NEEDED  . Docusate Calcium (STOOL SOFTENER PO) Take by mouth daily.   Marland Kitchen gabapentin (NEURONTIN) 300 MG capsule TAKE 1 CAPSULE BY MOUTH EVERY MORNING AND 2 CAPSULES BY MOUTH EVERY NIGHT AT BEDTIME  . lisinopril (PRINIVIL,ZESTRIL) 2.5 MG tablet TAKE 1 TABLET BY MOUTH ONCE DAILY  . MAGNESIUM CITRATE PO Take by mouth.  . metoprolol succinate (TOPROL XL) 25 MG 24 hr tablet Take 0.5 tablets (12.5 mg total) by mouth daily.  . Misc Natural Products (SINUS FORMULA PO) Take by mouth.  Marland Kitchen  Multiple Vitamin (MULTIVITAMIN) tablet Take 1 tablet by mouth daily.  . Omega-3 Fatty Acids (FISH OIL) 1000 MG CAPS Take one by mouth two times a day  .  pantoprazole (PROTONIX) 40 MG tablet Take 1 tablet (40 mg total) by mouth daily.  . sertraline (ZOLOFT) 100 MG tablet TAKE 1 TABLET BY MOUTH ONCE DAILY  . traMADol (ULTRAM) 50 MG tablet TAKE 1 TABLET BY MOUTH THREE TIMES DAILY AS NEEDED  . vitamin B-12 (CYANOCOBALAMIN) 500 MCG tablet Take 500 mcg by mouth every other day.  . [DISCONTINUED] vitamin B-12 (CYANOCOBALAMIN) 500 MCG tablet Take 1 tablet (500 mcg total) by mouth daily. (Patient taking differently: Take 500 mcg by mouth every other day. )    Allergies: Sulfa antibiotics; Tricor [fenofibrate]; Zetia [ezetimibe]; Contrast media [iodinated diagnostic agents]; Lipitor [atorvastatin]; and Pravastatin  Social History   Tobacco Use  . Smoking status: Former Smoker    Packs/day: 0.25    Years: 3.00    Pack years: 0.75    Types: Cigarettes    Last attempt to quit: 1990    Years since quitting: 29.7  . Smokeless tobacco: Never Used  . Tobacco comment: minimal smoking history  Substance Use Topics  . Alcohol use: No    Alcohol/week: 0.0 standard drinks  . Drug use: No    Family History  Problem Relation Age of Onset  . CAD Mother 74       MI  . Hypertension Mother   . Parkinson's disease Father   . Diabetes Father   . CAD Father 63       MI  . Hyperlipidemia Sister   . Cancer Neg Hx   . Stroke Neg Hx   . Colon cancer Neg Hx   . Breast cancer Neg Hx     Review of Systems: A 12-system review of systems was performed and was negative except as noted in the HPI.  --------------------------------------------------------------------------------------------------  Physical Exam: BP 126/74   Pulse (!) 54   Ht 5' 1.5" (1.562 m)   Wt 166 lb (75.3 kg)   BMI 30.86 kg/m   General: NAD. HEENT: No conjunctival pallor or scleral icterus. Moist mucous membranes.  OP clear. Neck: Supple without lymphadenopathy, thyromegaly, JVD, or HJR. No carotid bruit. Lungs: Normal work of breathing. Clear to auscultation bilaterally without  wheezes or crackles. Heart: Bradycardic but regular without murmurs, rubs, or gallops. Abd: Bowel sounds present. Soft, NT/ND without hepatosplenomegaly Ext: No lower extremity edema. Radial, PT, and DP pulses are 2+ bilaterally. Skin: Warm and dry without rash.  EKG: This bradycardia (heart rate 54 bpm) with nonspecific ST/T changes.  No significant change since 07/17/2017.  Lab Results  Component Value Date   WBC 4.7 03/13/2018   HGB 10.7 (L) 03/13/2018   HCT 31.6 (L) 03/13/2018   MCV 89.1 03/13/2018   PLT 176.0 03/13/2018    Lab Results  Component Value Date   NA 140 03/06/2018   K 4.6 03/06/2018   CL 103 03/06/2018   CO2 29 03/06/2018   BUN 29 (H) 03/06/2018   CREATININE 1.26 (H) 03/06/2018   GLUCOSE 97 03/06/2018   ALT 10 03/06/2018    Lab Results  Component Value Date   CHOL 314 (H) 03/06/2018   HDL 46.00 03/06/2018   LDLDIRECT 217.0 03/06/2018   TRIG 240.0 (H) 03/06/2018   CHOLHDL 7 03/06/2018    --------------------------------------------------------------------------------------------------  ASSESSMENT AND PLAN: Nonischemic cardiomyopathy Ms. Millar appears euvolemic and well compensated with NYHA class II symptoms consisting primarily  of fatigue, which is likely multifactorial.  Echocardiogram performed today demonstrates normalization of LVEF with medical therapy.  We will continue her current medications.  Frequent PACs None noted on EKG today.  Patient with no significant palpitations.  Continue low-dose metoprolol.  Carotid artery stenosis Mild to moderate disease noted on prior Doppler studies.  Repeat study performed today (interpretation pending).  Patient was recently started on Praluent, given statin intolerance and marked hyperlipidemia.  We will plan to continue this with repeat lipid panel through our lipid clinic (goal LDL less than 70).  Continue low-dose aspirin.  Lipidemia As above, goal LDL is less than 70.  Ms. Kowalchuk has tolerated her first  dose of Praluent well.  We will continue this therapy and monitoring through our lipid clinic.  Follow-up: Return to clinic in 1 year.  Nelva Bush, MD 04/25/2018 10:17 AM

## 2018-04-24 NOTE — Patient Instructions (Signed)
Medication Instructions:  Your physician has recommended you make the following change in your medication:  1- START Aspirin 81 mg by mouth once a day.  If you need a refill on your cardiac medications before your next appointment, please call your pharmacy.   Lab work: none If you have labs (blood work) drawn today and your tests are completely normal, you will receive your results only by: Marland Kitchen MyChart Message (if you have MyChart) OR . A paper copy in the mail If you have any lab test that is abnormal or we need to change your treatment, we will call you to review the results.  Testing/Procedures: none  Follow-Up: At [facility], you and your health needs are our priority.  As part of our continuing mission to provide you with exceptional heart care, we have created designated Provider Care Teams.  These Care Teams include your primary Cardiologist (physician) and Advanced Practice Providers (APPs -  Physician Assistants and Nurse Practitioners) who all work together to provide you with the care you need, when you need it. You will need a follow up appointment in 12 months.  Please call our office 2 months in advance to schedule this appointment.  You may see Dr Harrell Gave End or one of the following Advanced Practice Providers on your designated Care Team:   Murray Hodgkins, NP Christell Faith, PA-C . Marrianne Mood, PA-C

## 2018-04-25 ENCOUNTER — Encounter: Payer: Self-pay | Admitting: Family Medicine

## 2018-04-25 ENCOUNTER — Encounter: Payer: Self-pay | Admitting: Internal Medicine

## 2018-04-25 ENCOUNTER — Ambulatory Visit (INDEPENDENT_AMBULATORY_CARE_PROVIDER_SITE_OTHER): Payer: Medicare HMO | Admitting: Family Medicine

## 2018-04-25 VITALS — BP 124/68 | HR 48 | Temp 97.5°F | Wt 168.8 lb

## 2018-04-25 DIAGNOSIS — R300 Dysuria: Secondary | ICD-10-CM | POA: Diagnosis not present

## 2018-04-25 DIAGNOSIS — E7849 Other hyperlipidemia: Secondary | ICD-10-CM

## 2018-04-25 DIAGNOSIS — I6523 Occlusion and stenosis of bilateral carotid arteries: Secondary | ICD-10-CM

## 2018-04-25 DIAGNOSIS — N390 Urinary tract infection, site not specified: Secondary | ICD-10-CM | POA: Insufficient documentation

## 2018-04-25 DIAGNOSIS — N3001 Acute cystitis with hematuria: Secondary | ICD-10-CM | POA: Diagnosis not present

## 2018-04-25 DIAGNOSIS — E785 Hyperlipidemia, unspecified: Secondary | ICD-10-CM | POA: Insufficient documentation

## 2018-04-25 LAB — POC URINALSYSI DIPSTICK (AUTOMATED)
Bilirubin, UA: NEGATIVE
Glucose, UA: NEGATIVE
Ketones, UA: NEGATIVE
Nitrite, UA: NEGATIVE
Protein, UA: POSITIVE — AB
Spec Grav, UA: >=1.03 — AB (ref 1.010–1.025)
Urobilinogen, UA: 0.2 E.U./dL
pH, UA: 6 (ref 5.0–8.0)

## 2018-04-25 MED ORDER — CEPHALEXIN 500 MG PO CAPS: 500.0000 mg | ORAL_CAPSULE | Freq: Two times a day (BID) | ORAL | 0 refills | Status: DC

## 2018-04-25 NOTE — Progress Notes (Signed)
BP 124/68   Pulse (!) 48   Temp (!) 97.5 F (36.4 C) (Oral)   Wt 168 lb 12 oz (76.5 kg)   SpO2 95%   BMI 31.37 kg/m    CC: UTI? Subjective:    Patient ID: [name], female    DOB: October 03, 1945, 72 y.o.   MRN: [id number]  HPI: CASSARA NIDA is a 72 y.o. female presenting on 04/25/2018 for Dysuria   1d h/o dysuria, urgency, frequency, some lower back pain. Dark urine.  No fever/chills, vaginal rash, flank pain, nausea/vomiting.  Hasn't tried anything for this.   No recent abx use.  Last UTI "been a while"  She has been started on praluent - covered 100%!   Relevant past medical, surgical, family and social history reviewed and updated as indicated. Interim medical history since our last visit reviewed. Allergies and medications reviewed and updated. Outpatient Medications Prior to Visit  Medication Sig Dispense Refill  . Acetaminophen (ARTHRITIS PAIN RELIEF PO) Take by mouth.    . Alirocumab (PRALUENT) 75 MG/ML SOPN Inject 1 pen into the skin every 14 (fourteen) days. 2 pen 11  . Cholecalciferol (VITAMIN D3) 1000 UNITS CAPS Take 1 capsule (1,000 Units total) by mouth daily.    . clonazePAM (KLONOPIN) 1 MG tablet TAKE 1 TABLET BY MOUTH TWICE DAILY AS NEEDED 60 tablet 3  . Docusate Calcium (STOOL SOFTENER PO) Take by mouth daily.     Marland Kitchen gabapentin (NEURONTIN) 300 MG capsule TAKE 1 CAPSULE BY MOUTH EVERY MORNING AND 2 CAPSULES BY MOUTH EVERY NIGHT AT BEDTIME 270 capsule 2  . lisinopril (PRINIVIL,ZESTRIL) 2.5 MG tablet TAKE 1 TABLET BY MOUTH ONCE DAILY 90 tablet 1  . MAGNESIUM CITRATE PO Take by mouth.    . metoprolol succinate (TOPROL XL) 25 MG 24 hr tablet Take 0.5 tablets (12.5 mg total) by mouth daily. 45 tablet 3  . Misc Natural Products (SINUS FORMULA PO) Take by mouth.    . Multiple Vitamin (MULTIVITAMIN) tablet Take 1 tablet by mouth daily.    . Omega-3 Fatty Acids (FISH OIL) 1000 MG CAPS Take one by mouth two times a day    . pantoprazole (PROTONIX) 40 MG tablet Take 1  tablet (40 mg total) by mouth daily. 90 tablet 1  . sertraline (ZOLOFT) 100 MG tablet TAKE 1 TABLET BY MOUTH ONCE DAILY 90 tablet 1  . traMADol (ULTRAM) 50 MG tablet TAKE 1 TABLET BY MOUTH THREE TIMES DAILY AS NEEDED 120 tablet 0  . vitamin B-12 (CYANOCOBALAMIN) 500 MCG tablet Take 500 mcg by mouth every other day.     No facility-administered medications prior to visit.      Per HPI unless specifically indicated in ROS section below Review of Systems     Objective:    BP 124/68   Pulse (!) 48   Temp (!) 97.5 F (36.4 C) (Oral)   Wt 168 lb 12 oz (76.5 kg)   SpO2 95%   BMI 31.37 kg/m   Wt Readings from Last 3 Encounters:  04/25/18 168 lb 12 oz (76.5 kg)  04/24/18 166 lb (75.3 kg)  03/13/18 172 lb 8 oz (78.2 kg)    Physical Exam  Constitutional: She appears well-developed and well-nourished. No distress.  Abdominal: Soft. Bowel sounds are normal. She exhibits no distension and no mass. There is no hepatosplenomegaly. There is no tenderness. There is no rebound, no guarding and no CVA tenderness. No hernia.  Musculoskeletal: She exhibits no edema.  Vitals reviewed.  Assessment & Plan:   Problem List Items Addressed This Visit    Familial combined hyperlipidemia    Appreciate lipid clinic care. She was very satisfied with their care, now started treatment with repatha.       Acute cystitis with hematuria - Primary    Story, exam, UA consistent with hemorrhagic cystitis - Rx keflex 1 wk course. UCx sent. Discussed supportive care. Update if not improving with treatment.       Relevant Orders   Urine Culture    Other Visit Diagnoses    Dysuria       Relevant Orders   POCT Urinalysis Dipstick (Automated) (Completed)       Meds ordered this encounter  Medications  . cephALEXin (KEFLEX) 500 MG capsule    Sig: Take 1 capsule (500 mg total) by mouth 2 (two) times daily.    Dispense:  14 capsule    Refill:  0   Orders Placed This Encounter  Procedures  . Urine  Culture  . POCT Urinalysis Dipstick (Automated)    Follow up plan: Return if symptoms worsen or fail to improve.  Ria Bush, MD

## 2018-04-25 NOTE — Assessment & Plan Note (Addendum)
Appreciate lipid clinic care. She was very satisfied with their care, now started treatment with repatha.

## 2018-04-25 NOTE — Patient Instructions (Signed)
You do have a UTI - treat with keflex antibiotic. Urine culture sent May take azo. Push water and may use tylenol for discomfort  Urinary Tract Infection, Adult A urinary tract infection (UTI) is an infection of any part of the urinary tract. The urinary tract includes the:  Kidneys.  Ureters.  Bladder.  Urethra.  These organs make, store, and get rid of pee (urine) in the body. Follow these instructions at home:  Take over-the-counter and prescription medicines only as told by your doctor.  If you were prescribed an antibiotic medicine, take it as told by your doctor. Do not stop taking the antibiotic even if you start to feel better.  Avoid the following drinks: ? Alcohol. ? Caffeine. ? Tea. ? Carbonated drinks.  Drink enough fluid to keep your pee clear or pale yellow.  Keep all follow-up visits as told by your doctor. This is important.  Make sure to: ? Empty your bladder often and completely. Do not to hold pee for long periods of time. ? Empty your bladder before and after sex. ? Wipe from front to back after a bowel movement if you are female. Use each tissue one time when you wipe. Contact a doctor if:  You have back pain.  You have a fever.  You feel sick to your stomach (nauseous).  You throw up (vomit).  Your symptoms do not get better after 3 days.  Your symptoms go away and then come back. Get help right away if:  You have very bad back pain.  You have very bad lower belly (abdominal) pain.  You are throwing up and cannot keep down any medicines or water. This information is not intended to replace advice given to you by your health care provider. Make sure you discuss any questions you have with your health care provider. Document Released: 12/19/2007 Document Revised: 12/08/2015 Document Reviewed: 05/23/2015 Elsevier Interactive Patient Education  [employer].

## 2018-04-25 NOTE — Assessment & Plan Note (Signed)
Story, exam, UA consistent with hemorrhagic cystitis - Rx keflex 1 wk course. UCx sent. Discussed supportive care. Update if not improving with treatment.

## 2018-04-27 LAB — URINE CULTURE
MICRO NUMBER:: 91225719
SPECIMEN QUALITY:: ADEQUATE

## 2018-04-28 NOTE — Addendum Note (Signed)
Addended by: Alba Destine on: 04/28/2018 08:05 AM   Modules accepted: Orders

## 2018-05-07 ENCOUNTER — Encounter: Payer: Self-pay | Admitting: Gastroenterology

## 2018-05-13 ENCOUNTER — Other Ambulatory Visit: Payer: Self-pay | Admitting: Internal Medicine

## 2018-05-26 ENCOUNTER — Other Ambulatory Visit: Payer: Medicare HMO | Admitting: *Deleted

## 2018-05-26 DIAGNOSIS — E782 Mixed hyperlipidemia: Secondary | ICD-10-CM

## 2018-05-26 LAB — HEPATIC FUNCTION PANEL
ALT: 12 IU/L (ref 0–32)
AST: 21 IU/L (ref 0–40)
Albumin: 4.8 g/dL (ref 3.5–4.8)
Alkaline Phosphatase: 44 IU/L (ref 39–117)
Bilirubin Total: <0.2 mg/dL (ref 0.0–1.2)
Bilirubin, Direct: 0.07 mg/dL (ref 0.00–0.40)
Total Protein: 7 g/dL (ref 6.0–8.5)

## 2018-05-26 LAB — LIPID PANEL
Chol/HDL Ratio: 4.7 ratio — ABNORMAL HIGH (ref 0.0–4.4)
Cholesterol, Total: 212 mg/dL — ABNORMAL HIGH (ref 100–199)
HDL: 45 mg/dL (ref >39)
LDL Calculated: 120 mg/dL — ABNORMAL HIGH (ref 0–99)
Triglycerides: 235 mg/dL — ABNORMAL HIGH (ref 0–149)
VLDL Cholesterol Cal: 47 mg/dL — ABNORMAL HIGH (ref 5–40)

## 2018-05-28 ENCOUNTER — Telehealth: Payer: Self-pay | Admitting: Student-PharmD

## 2018-05-28 NOTE — Telephone Encounter (Signed)
Called patient to inform her of the results of her lipid panel, no answer and LVM. Patient called back shortly after. Discussed increasing Praluent dose from 75 mg q2weeks to 150 mg q2weeks to achieve further LDL lowering effect. Will send a new prescription to [facility] for the higher dose so that patient can start that when she receives her shipment in December. Patient has filled out [employer] application for 9150, will send the application in to reflect the increase in dose for 2020.

## 2018-05-29 ENCOUNTER — Telehealth: Payer: Self-pay | Admitting: *Deleted

## 2018-05-29 NOTE — Telephone Encounter (Signed)
No answer. Left message to call back.   

## 2018-05-29 NOTE — Telephone Encounter (Signed)
-----   Message from Nelva Bush, MD sent at 05/28/2018  5:55 AM EST ----- Please let Ann White know that her LDL has improved significantly but is still a bit above goal at 120.  She should continue Praluent.  I will also forward these results to the lipid clinic for their review.

## 2018-05-29 NOTE — Telephone Encounter (Signed)
Pt is aware and rx for higher strength has been sent to patient assistance.

## 2018-05-29 NOTE — Telephone Encounter (Signed)
Patient called back. States she is aware of results and that she s/w Georgina Peer yesterday. Georgina Peer advised her to finish with her current supply and then she would send in increased dose for December. Routing to Tana Coast ,PHarmD for further management.

## 2018-06-04 ENCOUNTER — Other Ambulatory Visit: Payer: Self-pay

## 2018-06-04 ENCOUNTER — Encounter: Payer: Self-pay | Admitting: Gastroenterology

## 2018-06-04 ENCOUNTER — Ambulatory Visit (AMBULATORY_SURGERY_CENTER): Payer: Self-pay | Admitting: *Deleted

## 2018-06-04 VITALS — Ht 64.0 in | Wt 169.0 lb

## 2018-06-04 DIAGNOSIS — Z86010 Personal history of colonic polyps: Secondary | ICD-10-CM

## 2018-06-04 MED ORDER — SUPREP BOWEL PREP KIT 17.5-3.13-1.6 GM/177ML PO SOLN: 1.0000 | Freq: Once | ORAL | 0 refills | Status: AC

## 2018-06-04 NOTE — Progress Notes (Signed)
No egg or soy allergy known to patient  No issues with past sedation with any surgeries  or procedures, no intubation problems  No diet pills per patient No home 02 use per patient  No blood thinners per patient  Pt has constipation, off and on, laST COLON ONLY ADEQUATE, TWO DAY PREP GIVEN No A fib or A flutter  EMMI video sent to pt's e mail  SAMPLE SUPREP, LOT # [id number], EXP 10/14

## 2018-06-09 ENCOUNTER — Other Ambulatory Visit: Payer: Self-pay | Admitting: Family Medicine

## 2018-06-09 NOTE — Telephone Encounter (Signed)
Last office visit 04/25/18 for acute cystitis.  Last refilled 01/26/2018 for #60 with 3 refills.  Next appt:  03/25/2019 for CPE.  UDS/Contract  03/16/2015.

## 2018-06-10 NOTE — Telephone Encounter (Signed)
Pt left v/m that pt is out of clonazepam and request clonazepam be sent to [address] today. Pt request cb when refill done.

## 2018-06-10 NOTE — Telephone Encounter (Signed)
E prescribed. plz notify pt 

## 2018-06-15 HISTORY — PX: COLONOSCOPY: SHX174

## 2018-06-18 ENCOUNTER — Ambulatory Visit (AMBULATORY_SURGERY_CENTER): Payer: Medicare HMO | Admitting: Gastroenterology

## 2018-06-18 ENCOUNTER — Encounter: Payer: Self-pay | Admitting: Gastroenterology

## 2018-06-18 VITALS — BP 143/65 | HR 60 | Temp 96.8°F | Resp 15 | Ht 64.0 in | Wt 169.0 lb

## 2018-06-18 DIAGNOSIS — Z86010 Personal history of colonic polyps: Secondary | ICD-10-CM | POA: Diagnosis not present

## 2018-06-18 DIAGNOSIS — D124 Benign neoplasm of descending colon: Secondary | ICD-10-CM | POA: Diagnosis not present

## 2018-06-18 DIAGNOSIS — K766 Portal hypertension: Secondary | ICD-10-CM | POA: Diagnosis not present

## 2018-06-18 DIAGNOSIS — K219 Gastro-esophageal reflux disease without esophagitis: Secondary | ICD-10-CM | POA: Diagnosis not present

## 2018-06-18 DIAGNOSIS — I10 Essential (primary) hypertension: Secondary | ICD-10-CM | POA: Diagnosis not present

## 2018-06-18 DIAGNOSIS — Z860101 Personal history of adenomatous and serrated colon polyps: Secondary | ICD-10-CM

## 2018-06-18 MED ORDER — SODIUM CHLORIDE 0.9 % IV SOLN: 500.0000 mL | Freq: Once | INTRAVENOUS | Status: DC

## 2018-06-18 NOTE — Patient Instructions (Signed)
Handouts Provided:  Polyps, Hemorrhoids, Diverticulosis, and High Fiber Diet  YOU HAD AN ENDOSCOPIC PROCEDURE TODAY AT [facility]:   Refer to the procedure report that was given to you for any specific questions about what was found during the examination.  If the procedure report does not answer your questions, please call your gastroenterologist to clarify.  If you requested that your care partner not be given the details of your procedure findings, then the procedure report has been included in a sealed envelope for you to review at your convenience later.  YOU SHOULD EXPECT: Some feelings of bloating in the abdomen. Passage of more gas than usual.  Walking can help get rid of the air that was put into your GI tract during the procedure and reduce the bloating. If you had a lower endoscopy (such as a colonoscopy or flexible sigmoidoscopy) you may notice spotting of blood in your stool or on the toilet paper. If you underwent a bowel prep for your procedure, you may not have a normal bowel movement for a few days.  Please Note:  You might notice some irritation and congestion in your nose or some drainage.  This is from the oxygen used during your procedure.  There is no need for concern and it should clear up in a day or so.  SYMPTOMS TO REPORT IMMEDIATELY:   Following lower endoscopy (colonoscopy or flexible sigmoidoscopy):  Excessive amounts of blood in the stool  Significant tenderness or worsening of abdominal pains  Swelling of the abdomen that is new, acute  Fever of 100F or higher   For urgent or emergent issues, a gastroenterologist can be reached at any hour by calling [phone].   DIET:  We do recommend a small meal at first, but then you may proceed to your regular diet.  Drink plenty of fluids but you should avoid alcoholic beverages for 24 hours.  ACTIVITY:  You should plan to take it easy for the rest of today and you should NOT DRIVE or use heavy  machinery until tomorrow (because of the sedation medicines used during the test).    FOLLOW UP: Our staff will call the number listed on your records the next business day following your procedure to check on you and address any questions or concerns that you may have regarding the information given to you following your procedure. If we do not reach you, we will leave a message.  However, if you are feeling well and you are not experiencing any problems, there is no need to return our call.  We will assume that you have returned to your regular daily activities without incident.  If any biopsies were taken you will be contacted by phone or by letter within the next 1-3 weeks.  Please call us at [phone] if you have not heard about the biopsies in 3 weeks.    SIGNATURES/CONFIDENTIALITY: You and/or your care partner have signed paperwork which will be entered into your electronic medical record.  These signatures attest to the fact that that the information above on your After Visit Summary has been reviewed and is understood.  Full responsibility of the confidentiality of this discharge information lies with you and/or your care-partner.

## 2018-06-18 NOTE — Progress Notes (Signed)
Called to room to assist during endoscopic procedure.  Patient ID and intended procedure confirmed with present staff. Received instructions for my participation in the procedure from the performing physician.  

## 2018-06-18 NOTE — Progress Notes (Signed)
Report to PACU, RN, vss, BBS= Clear.  

## 2018-06-18 NOTE — Progress Notes (Signed)
Patient complained of upper right quadrant pain/cramping.  Patient was passing gas and she walked to the bathroom and was able to release more air and free of pain/cramping prior to discharge. Patient understands when to call if needed.

## 2018-06-19 ENCOUNTER — Telehealth: Payer: Self-pay

## 2018-06-19 NOTE — Op Note (Signed)
[facility] Patient Name: Ann White Procedure Date: 06/18/2018 8:30 AM MRN: [id number] Endoscopist: Ladene Artist , MD Age: 72 Referring MD:  Date of Birth: May 11, 1946 Gender: Female Account #: [id number] Procedure:                Colonoscopy Indications:              Surveillance: Personal history of adenomatous                            polyps on last colonoscopy 5 years ago Medicines:                Monitored Anesthesia Care Procedure:                Pre-Anesthesia Assessment:                           - Prior to the procedure, a History and Physical                            was performed, and patient medications and                            allergies were reviewed. The patient's tolerance of                            previous anesthesia was also reviewed. The risks                            and benefits of the procedure and the sedation                            options and risks were discussed with the patient.                            All questions were answered, and informed consent                            was obtained. Prior Anticoagulants: The patient has                            taken no previous anticoagulant or antiplatelet                            agents. ASA Grade Assessment: II - A patient with                            mild systemic disease. After reviewing the risks                            and benefits, the patient was deemed in                            satisfactory condition to undergo the procedure.  After obtaining informed consent, the colonoscope                            was passed under direct vision. Throughout the                            procedure, the patient's blood pressure, pulse, and                            oxygen saturations were monitored continuously. The                            Model PCF-H190DL [id number]) scope was introduced                            through the anus and  advanced to the the cecum,                            identified by appendiceal orifice and ileocecal                            valve. The ileocecal valve, appendiceal orifice,                            and rectum were photographed. The quality of the                            bowel preparation was good. The colonoscopy was                            performed without difficulty. The patient tolerated                            the procedure well. Scope In: 8:41:09 AM Scope Out: 8:93:81 AM Scope Withdrawal Time: 0 hours 14 minutes 58 seconds  Total Procedure Duration: 0 hours 25 minutes 38 seconds  Findings:                 The perianal and digital rectal examinations were                            normal.                           Two sessile polyps were found in the rectum and                            descending colon. The polyps were 5 to 6 mm in                            size. These polyps were removed with a cold snare.                            Resection and retrieval were complete for the  desceding colon polyp.                           A single small localized angiodysplastic lesion                            without bleeding was found in the cecum.                           Multiple medium-mouthed diverticula were found in                            the left colon.                           The exam was otherwise without abnormality on                            direct and retroflexion views. Complications:            No immediate complications. Estimated blood loss:                            None. Estimated Blood Loss:     Estimated blood loss: none. Impression:               - Two 5 to 6 mm polyps in the rectum and in the                            descending colon, removed with a cold snare.                            Resected and retrieved.                           - A single non-bleeding colonic angiodysplastic                             lesion.                           - Moderate diverticulosis in the left colon.                           - The examination was otherwise normal on direct                            and retroflexion views. Recommendation:           - Repeat colonoscopy in 5 years for surveillance.                           - Patient has a contact number available for                            emergencies. The signs and symptoms of potential  delayed complications were discussed with the                            patient. Return to normal activities tomorrow.                            Written discharge instructions were provided to the                            patient.                           - Continue present medications.                           - Await pathology results.                           - High fiber diet indefinitely. Ladene Artist, MD 06/18/2018 9:11:03 AM This report has been signed electronically.

## 2018-06-19 NOTE — Telephone Encounter (Signed)
  Follow up Call-  Call back number 06/18/2018  Post procedure Call Back phone  # [phone]  Permission to leave phone message Yes  Some recent data might be hidden     Patient questions:  Do you have a fever, pain , or abdominal swelling? No. Pain Score  0 *  Have you tolerated food without any problems? Yes.    Have you been able to return to your normal activities? Yes.    Do you have any questions about your discharge instructions: Diet   No. Medications  No. Follow up visit  No.  Do you have questions or concerns about your Care? No.  Actions: * If pain score is 4 or above: No action needed, pain <4.

## 2018-06-23 ENCOUNTER — Encounter: Payer: Self-pay | Admitting: Gastroenterology

## 2018-06-29 ENCOUNTER — Encounter: Payer: Self-pay | Admitting: Family Medicine

## 2018-07-11 ENCOUNTER — Other Ambulatory Visit: Payer: Self-pay | Admitting: Family Medicine

## 2018-07-11 NOTE — Telephone Encounter (Signed)
Gabapentin Last filled:  04/14/18, #270/2 Last OV:  04/25/18, acute Nov OV:  03/25/19, CPE

## 2018-07-15 ENCOUNTER — Telehealth: Payer: Self-pay | Admitting: Pharmacist

## 2018-07-15 NOTE — Telephone Encounter (Signed)
New message    Calling to see if praluent  Is approved for 2020.

## 2018-07-15 NOTE — Telephone Encounter (Signed)
Returned call to pt, she states she received an approval letter for [employer] for 2020 year but lost it. We have not received approval fax yet, however I provided pt with [employer] # so that she can contact them for refills for Praluent.

## 2018-08-11 ENCOUNTER — Ambulatory Visit (INDEPENDENT_AMBULATORY_CARE_PROVIDER_SITE_OTHER): Payer: Medicare HMO | Admitting: Family Medicine

## 2018-08-11 ENCOUNTER — Encounter: Payer: Self-pay | Admitting: Family Medicine

## 2018-08-11 VITALS — BP 124/70 | HR 50 | Temp 97.9°F | Ht 61.5 in | Wt 166.5 lb

## 2018-08-11 DIAGNOSIS — M1711 Unilateral primary osteoarthritis, right knee: Secondary | ICD-10-CM

## 2018-08-11 MED ORDER — METHYLPREDNISOLONE ACETATE 40 MG/ML IJ SUSP
80.0000 mg | Freq: Once | INTRAMUSCULAR | Status: AC
  Administered 2018-08-11: 80 mg via INTRA_ARTICULAR

## 2018-08-11 NOTE — Patient Instructions (Signed)
Aspercreme or Salonpas or generic linament with 4% Lidocaine   Capzaicin topical cream - over the counter.  -- use a glove or sandwich bag.  -- will burn mouth or eyes

## 2018-08-11 NOTE — Addendum Note (Signed)
Addended by: Carter Kitten on: 08/11/2018 03:09 PM   Modules accepted: Orders

## 2018-08-11 NOTE — Progress Notes (Signed)
Dr. Frederico Hamman T. Eugenio Dollins, MD, [facility] [place], [address] Phone: [phone] Fax: [phone]  08/11/2018  Patient: Ann White, MRN: [id number], DOB: Jun 08, 1946, 73 y.o.  Primary Physician:  Ria Bush, MD   Chief Complaint  Patient presents with  . Knee Pain    Right   Subjective:   Ann White is a 74 y.o. very pleasant female patient who presents with the following:  She has known B knee DJD and had prior steroid injections 11/2017.  She is a very pleasant patient, she has been doing well over the last 9 months, and she does have some pain now in the right leg and right knee without an effusion.  She does take acetaminophen regularly as well as some tramadol.  She occasionally has used some heat for her knee.  R DJD knee.   R knee inj  Past Medical History, Surgical History, Social History, Family History, Problem List, Medications, and Allergies have been reviewed and updated if relevant.  Patient Active Problem List   Diagnosis Date Noted  . Acute cystitis with hematuria 04/25/2018  . Bilateral carotid artery stenosis 04/25/2018  . Hyperlipidemia LDL goal <70 04/25/2018  . Nonischemic cardiomyopathy ([place]) 07/17/2017  . PAC (premature atrial contraction) 04/17/2017  . Abnormal EKG 04/17/2017  . Irregular heart beat 03/05/2017  . Carotid stenosis 03/14/2016  . Health maintenance examination 02/28/2016  . Anemia due to stage 3 chronic kidney disease ([place]) 06/29/2015  . Chronic right maxillary sinusitis 06/29/2015  . Ectatic abdominal aorta ([place])   . Encounter for therapeutic drug monitoring 05/30/2015  . Personal history of DVT (deep vein thrombosis) 05/30/2015  . Advanced care planning/counseling discussion 03/16/2015  . CKD (chronic kidney disease) stage 3, GFR 30-59 ml/min (HCC) 08/19/2013  . Hot flash, menopausal 02/24/2013  . Obesity, Class I, BMI 30-34.9 02/24/2013  . Medicare annual  wellness visit, subsequent 08/27/2012  . Hyperglycemia 08/27/2012  . Lichen sclerosus et atrophicus   . Irritable bowel syndrome with constipation   . HTN (hypertension)   . Seasonal allergic rhinitis   . GERD (gastroesophageal reflux disease)   . MDD (major depressive disorder), recurrent episode, moderate ([place])   . History of diverticulitis of colon   . Familial combined hyperlipidemia   . PULMONARY FIBROSIS, POSTINFLAMMATORY 09/10/2008  . Cervical spondylosis without myelopathy 09/09/2008    Past Medical History:  Diagnosis Date  . Allergy   . Anxiety   . Arthritis   . Cardiomyopathy ([place])    a. 04/2017 Echo: EF 35-40%, diff HK, mild MR, mildly dil LA, nl RV fxn.  . Carotid stenosis    a. Bilateral 40-59%, rpt 1 yr (02/2016); b. 03/2017 Carotid U/S: RICA 92-11%, LICA 9-41%, DEYC>14% - overall stable.  . Cervical spondylosis    s/p spine injections  . Depression   . Ectatic abdominal aorta ([place]) 2016   rpt [place] 5 yrs  . GERD (gastroesophageal reflux disease)   . History of chicken pox   . History of diverticulitis of colon   . HLD (hyperlipidemia)   . HTN (hypertension)   . Irritable bowel syndrome with constipation   . Lichen sclerosus et atrophicus   . Obesity   . Premature atrial contraction    a. 04/2017 48hr Holter: Predominant rhythm - sinus. Rare PVC's, freq PAC's with freq atrial runs up to 34 beats-->metoprolol started.  . Seasonal allergic rhinitis     Past Surgical History:  Procedure  Laterality Date  . bladder tack  1990s  . BREAST BIOPSY Right 03/05/2012    benign  . CARPAL TUNNEL RELEASE  2004, 2013   bilateral  . CHOLECYSTECTOMY  1990s  . COLONOSCOPY  2002  . COLONOSCOPY  06/2013   mod diverticulosis, 3 tubular adenomas, int hem rpt 5 yrs [facility])  . COLONOSCOPY  06/2018   TA,c olonic angiodysplastic lesion, mod diverticulosis, rpt 5 yrs [facility])  . DEXA  07/2011   normal, T score -0.3  . ESOPHAGOGASTRODUODENOSCOPY ENDOSCOPY  2004   normal [facility])    . TOTAL ABDOMINAL HYSTERECTOMY W/ BILATERAL SALPINGOOPHORECTOMY  1990s   complete, dysmenorrhea and fibroids    Social History   Socioeconomic History  . Marital status: Married    Spouse name: Not on file  . Number of children: Not on file  . Years of education: Not on file  . Highest education level: Not on file  Occupational History  . Not on file  Social Needs  . Financial resource strain: Not on file  . Food insecurity:    Worry: Not on file    Inability: Not on file  . Transportation needs:    Medical: Not on file    Non-medical: Not on file  Tobacco Use  . Smoking status: Former Smoker    Packs/day: 0.25    Years: 3.00    Pack years: 0.75    Types: Cigarettes    Last attempt to quit: 1990    Years since quitting: 30.0  . Smokeless tobacco: Never Used  . Tobacco comment: minimal smoking history  Substance and Sexual Activity  . Alcohol use: No    Alcohol/week: 0.0 standard drinks  . Drug use: No  . Sexual activity: Never  Lifestyle  . Physical activity:    Days per week: Not on file    Minutes per session: Not on file  . Stress: Not on file  Relationships  . Social connections:    Talks on phone: Not on file    Gets together: Not on file    Attends religious service: Not on file    Active member of club or organization: Not on file    Attends meetings of clubs or organizations: Not on file    Relationship status: Not on file  . Intimate partner violence:    Fear of current or ex partner: Not on file    Emotionally abused: Not on file    Physically abused: Not on file    Forced sexual activity: Not on file  Other Topics Concern  . Not on file  Social History Narrative   Lives with husband and 2 cats.  2 grown children, 5 grandchildren.   Occupation: retired, worked in [employer]   Activity: no regular exercise.  Does walk in summer   Diet: fruits/vegetables daily, good water.    Family History  Problem Relation Age of Onset  . CAD Mother 51       MI   . Hypertension Mother   . Parkinson's disease Father   . Diabetes Father   . CAD Father 85       MI  . Hyperlipidemia Sister   . Cancer Neg Hx   . Stroke Neg Hx   . Colon cancer Neg Hx   . Breast cancer Neg Hx   . Colon polyps Neg Hx   . Esophageal cancer Neg Hx   . Stomach cancer Neg Hx   . Rectal cancer Neg Hx  Allergies  Allergen Reactions  . Sulfa Antibiotics Swelling    Causes mouth to swell  . Tricor [Fenofibrate] Other (See Comments)    myalgias  . Zetia [Ezetimibe] Other (See Comments)    Dizziness  . Contrast Media [Iodinated Diagnostic Agents] Other (See Comments)    Oral contrast caused mouth blisters  . Lipitor [Atorvastatin] Other (See Comments)    myalgias  . Pravastatin Other (See Comments)    myalgias    Medication list reviewed and updated in full in [facility].  GEN: No fevers, chills. Nontoxic. Primarily MSK c/o today. MSK: Detailed in the HPI GI: tolerating PO intake without difficulty Neuro: No numbness, parasthesias, or tingling associated. Otherwise the pertinent positives of the ROS are noted above.   Objective:   BP 124/70   Pulse (!) 50   Temp 97.9 F (36.6 C) (Oral)   Ht 5' 6.5" (1.689 m)   Wt 166 lb 8 oz (75.5 kg)   BMI 26.47 kg/m    GEN: WDWN, NAD, Non-toxic, Alert & Oriented x 3 HEENT: Atraumatic, Normocephalic.  Ears and Nose: No external deformity. EXTR: No clubbing/cyanosis/edema NEURO: Normal gait.  PSYCH: Normally interactive. Conversant. Not depressed or anxious appearing.  Calm demeanor.    Right knee: Patient has full extension.  Flexion to 100 degrees.  There is no patellar motion possible.  She does have medial and lateral joint line tenderness with a stable MCL and LCL ligaments.  ACL and PCL are intact.  Flexion pinch does cause pain as well as McMurray's.  Radiology: CLINICAL DATA:  Right knee pain for the past 6 months. No known trauma.  EXAM: RIGHT KNEE 3 VIEWS  COMPARISON:   None.  FINDINGS: No fracture or dislocation. Marker tricompartmental degenerative change of the knee, worse within the lateral compartment with joint space loss, subchondral sclerosis and osteophytosis. There is minimal spurring of the tibial spines. No evidence of chondrocalcinosis. There is minimal enthesopathic change involving the superior pole the patella. No joint effusion. Regional soft tissues appear normal.  IMPRESSION: 1. No acute findings. 2. Moderate tricompartmental degenerative change of the knee, worse within the lateral compartment.   Electronically Signed   By: Sandi Mariscal M.D.   On: 09/28/2015 10:10  Assessment and Plan:   Primary osteoarthritis of right knee  She continues to do pretty well from a conservative arthritis standpoint.  She does have some significant loss of flexion in her right knee and ongoing bilateral tricompartmental osteoarthritis.  She is having a flare currently now, we will go to do an intra-articular injection to see if this will help.  Addition of some topical liniments to see if this helps as well.  Knee Injection, R Date of procedure: 08/11/2018 Patient verbally consented to procedure. Risks (including potential rare risk of infection), benefits, and alternatives explained. Sterilely prepped with Chloraprep. Ethyl cholride used for anesthesia. 8 cc Lidocaine 1% mixed with 2 mL Depo-Medrol 40 mg injected using the anteromedial approach without difficulty. No complications with procedure and tolerated well. Patient had decreased pain post-injection. Medication: 2 mL of Depo-Medrol 40 mg, equaling Depo-Medrol 80 mg total   Patient Instructions  Aspercreme or Salonpas or generic linament with 4% Lidocaine   Capzaicin topical cream - over the counter.  -- use a glove or sandwich bag.  -- will burn mouth or eyes    Follow-up: prn  Signed,  Tora Prunty T. Abri Vacca, MD   Outpatient Encounter Medications as of 08/11/2018  Medication  Sig  . Acetaminophen (  ARTHRITIS PAIN RELIEF PO) Take by mouth.  . Alirocumab (PRALUENT) 150 MG/ML SOAJ Inject 1 pen into the skin every 14 (fourteen) days.  . Cholecalciferol (VITAMIN D3) 1000 UNITS CAPS Take 1 capsule (1,000 Units total) by mouth daily.  . clonazePAM (KLONOPIN) 1 MG tablet TAKE 1 TABLET BY MOUTH TWICE DAILY AS NEEDED  . Docusate Calcium (STOOL SOFTENER PO) Take by mouth daily.   Marland Kitchen gabapentin (NEURONTIN) 300 MG capsule TAKE ONE CAPSULE EVERY MORNING AND 2 CAPSULES AT BEDTIME  . lisinopril (PRINIVIL,ZESTRIL) 2.5 MG tablet TAKE 1 TABLET BY MOUTH ONCE DAILY  . MAGNESIUM CITRATE PO Take by mouth.  . metoprolol succinate (TOPROL-XL) 25 MG 24 hr tablet TAKE 1/2 (ONE-HALF) TABLET BY MOUTH ONCE DAILY  . Misc Natural Products (SINUS FORMULA PO) Take by mouth.  . Multiple Vitamin (MULTIVITAMIN) tablet Take 1 tablet by mouth daily.  . Omega-3 Fatty Acids (FISH OIL) 1000 MG CAPS Take one by mouth two times a day  . pantoprazole (PROTONIX) 40 MG tablet Take 1 tablet (40 mg total) by mouth daily.  . sertraline (ZOLOFT) 100 MG tablet TAKE 1 TABLET BY MOUTH ONCE DAILY  . traMADol (ULTRAM) 50 MG tablet TAKE 1 TABLET BY MOUTH THREE TIMES DAILY AS NEEDED  . vitamin B-12 (CYANOCOBALAMIN) 500 MCG tablet Take 500 mcg by mouth every other day.   No facility-administered encounter medications on file as of 08/11/2018.

## 2018-08-25 ENCOUNTER — Ambulatory Visit (INDEPENDENT_AMBULATORY_CARE_PROVIDER_SITE_OTHER): Payer: Medicare HMO | Admitting: Family Medicine

## 2018-08-25 ENCOUNTER — Encounter: Payer: Self-pay | Admitting: Family Medicine

## 2018-08-25 DIAGNOSIS — J0100 Acute maxillary sinusitis, unspecified: Secondary | ICD-10-CM | POA: Diagnosis not present

## 2018-08-25 MED ORDER — AMOXICILLIN-POT CLAVULANATE 875-125 MG PO TABS: 1.0000 | ORAL_TABLET | Freq: Two times a day (BID) | ORAL | 0 refills | Status: DC

## 2018-08-25 NOTE — Patient Instructions (Signed)
Rest and fluids.  Start augmentin.  Use warm compresses on your face.  Gently try to pop your ears.   Restart flonase.   Update [place] as needed.   Take care.  Glad to see you.

## 2018-08-25 NOTE — Progress Notes (Signed)
Sx started last week.  Noted R sided maxillary congestion, HA, all sx R>L.  Some R ear pain.  No L ear pain.  R max area ttp. No vomiting.  No diarrhea.  Minimal cough.  Minimal ST.  Still on baseline meds.    She has h/o similar in the past with prev prolonged sx and usually has sx on the R max area.    Meds, vitals, and allergies reviewed.   ROS: Per HPI unless specifically indicated in ROS section   GEN: nad, alert and oriented HEENT: mucous membranes moist, tm w/o erythema, nasal exam w/o erythema, clear discharge noted,  OP with cobblestoning,R max area ttp NECK: supple w/o LA CV: rrr.   PULM: ctab, no inc wob EXT: no edema SKIN: no acute rash

## 2018-08-27 NOTE — Assessment & Plan Note (Signed)
Rest and fluids.  Start augmentin.  Use warm compresses on face.  Gently perform Valsalva. Restart flonase.   Update [place] as needed.  She agrees with plan.

## 2018-09-05 ENCOUNTER — Telehealth: Payer: Self-pay

## 2018-09-05 NOTE — Telephone Encounter (Signed)
Called pt to let them know that pass pt assistance approved their praluent medication and gave the pt their phone number and instructed her on how to get refills

## 2018-09-16 ENCOUNTER — Encounter: Payer: Self-pay | Admitting: Family Medicine

## 2018-09-16 ENCOUNTER — Ambulatory Visit (INDEPENDENT_AMBULATORY_CARE_PROVIDER_SITE_OTHER): Payer: Medicare HMO | Admitting: Family Medicine

## 2018-09-16 VITALS — BP 120/68 | HR 50 | Temp 97.8°F | Ht 61.5 in | Wt 162.6 lb

## 2018-09-16 DIAGNOSIS — R300 Dysuria: Secondary | ICD-10-CM

## 2018-09-16 DIAGNOSIS — N3000 Acute cystitis without hematuria: Secondary | ICD-10-CM

## 2018-09-16 LAB — POC URINALSYSI DIPSTICK (AUTOMATED)
Bilirubin, UA: NEGATIVE
Blood, UA: NEGATIVE
Glucose, UA: NEGATIVE
Ketones, UA: NEGATIVE
Nitrite, UA: NEGATIVE
Protein, UA: POSITIVE — AB
Spec Grav, UA: 1.025 (ref 1.010–1.025)
Urobilinogen, UA: 0.2 E.U./dL
pH, UA: 5.5 (ref 5.0–8.0)

## 2018-09-16 MED ORDER — NITROFURANTOIN MONOHYD MACRO 100 MG PO CAPS: 100.0000 mg | ORAL_CAPSULE | Freq: Two times a day (BID) | ORAL | 0 refills | Status: DC

## 2018-09-16 MED ORDER — FLUCONAZOLE 150 MG PO TABS: 150.0000 mg | ORAL_TABLET | Freq: Once | ORAL | 0 refills | Status: AC

## 2018-09-16 NOTE — Progress Notes (Signed)
BP 120/68 (BP Location: Left Arm, Patient Position: Sitting, Cuff Size: Normal)   Pulse (!) 50   Temp 97.8 F (36.6 C) (Oral)   Ht 5' 1.5" (1.562 m)   Wt 162 lb 9 oz (73.7 kg)   SpO2 97%   BMI 30.22 kg/m    CC: UTI? Subjective:    Patient ID: Ann White, female    DOB: 06/07/1946, 73 y.o.   MRN: [id number]  HPI: INDICA MARCOTT is a 73 y.o. female presenting on 09/16/2018 for Dysuria (C/o pain and buring with urination. Sxs started 2 days ago. Tried Cystex, not helpful. Pt accompanied by her husband, [name]. ) (C/o pain and buring with urination. Sxs started 2 days ago. Tried Cystex, not helpful. Pt accompanied by her husband, [name]. )   2-3d h/o dysuria, raw area. Frequency, initial lower abd discomfort   No fevers/chills, urgency, hematuria, flank pain, nausea or vomiting.   H/o bladder tack 1990s.   UTI 04/2018 treated with 1 wk keflex course with benefit.  UCx at that time grew pan sensitive E coli.   Would like diflucan if abx prescribed for post-abx yeast infection.     Relevant past medical, surgical, family and social history reviewed and updated as indicated. Interim medical history since our last visit reviewed. Allergies and medications reviewed and updated. Outpatient Medications Prior to Visit  Medication Sig Dispense Refill  . Acetaminophen (ARTHRITIS PAIN RELIEF PO) Take by mouth.    . Alirocumab (PRALUENT) 150 MG/ML SOAJ Inject 1 pen into the skin every 14 (fourteen) days.    . Cholecalciferol (VITAMIN D3) 1000 UNITS CAPS Take 1 capsule (1,000 Units total) by mouth daily.    . clonazePAM (KLONOPIN) 1 MG tablet TAKE 1 TABLET BY MOUTH TWICE DAILY AS NEEDED 60 tablet 3  . Docusate Calcium (STOOL SOFTENER PO) Take by mouth daily.     Marland Kitchen gabapentin (NEURONTIN) 300 MG capsule TAKE ONE CAPSULE EVERY MORNING AND 2 CAPSULES AT BEDTIME 270 capsule 2  . lisinopril (PRINIVIL,ZESTRIL) 2.5 MG tablet TAKE 1 TABLET BY MOUTH ONCE DAILY 90 tablet 1  . MAGNESIUM CITRATE PO Take by mouth.    . metoprolol succinate (TOPROL-XL) 25 MG 24 hr tablet TAKE 1/2 (ONE-HALF) TABLET BY MOUTH ONCE DAILY 45 tablet 3  .  Misc Natural Products (SINUS FORMULA PO) Take by mouth.    . Multiple Vitamin (MULTIVITAMIN) tablet Take 1 tablet by mouth daily.    . Omega-3 Fatty Acids (FISH OIL) 1000 MG CAPS Take one by mouth two times a day    . pantoprazole (PROTONIX) 40 MG tablet Take 1 tablet (40 mg total) by mouth daily. 90 tablet 1  . sertraline (ZOLOFT) 100 MG tablet TAKE 1 TABLET BY MOUTH ONCE DAILY 90 tablet 1  . traMADol (ULTRAM) 50 MG tablet TAKE 1 TABLET BY MOUTH THREE TIMES DAILY AS NEEDED 120 tablet 0  . vitamin B-12 (CYANOCOBALAMIN) 500 MCG tablet Take 500 mcg by mouth every other day.    Marland Kitchen amoxicillin-clavulanate (AUGMENTIN) 875-125 MG tablet Take 1 tablet by mouth 2 (two) times daily. 20 tablet 0   No facility-administered medications prior to visit.      Per HPI unless specifically indicated in ROS section below Review of Systems Objective:    BP 120/68 (BP Location: Left Arm, Patient Position: Sitting, Cuff Size: Normal)   Pulse (!) 50   Temp 97.8 F (36.6 C) (Oral)   Ht 5' 1.5" (1.562 m)   Wt 162 lb 9 oz (73.7 kg)   SpO2 97%   BMI 30.22 kg/m   Wt Readings from Last 3 Encounters:  09/16/18 162  lb 9 oz (73.7 kg)  08/11/18 166 lb 8 oz (75.5 kg)  06/18/18 169 lb (76.7 kg)    Physical Exam Vitals signs and nursing note reviewed.  Constitutional:      Appearance: Normal appearance. She is not ill-appearing.  Abdominal:     General: Abdomen is flat. Bowel sounds are normal. There is no distension.     Palpations: Abdomen is soft. There is no mass.     Tenderness: There is abdominal tenderness (mild) in the suprapubic area. There is no right CVA tenderness, left CVA tenderness, guarding or rebound.     Hernia: No hernia is present.  Musculoskeletal:     Right lower leg: No edema.     Left lower leg: No edema.  Neurological:     Mental Status: She is alert.       Results for orders placed or performed in visit on 09/16/18  POCT Urinalysis Dipstick (Automated)  Result Value Ref Range    Color, UA yellow    Clarity, UA cloudy    Glucose, UA Negative Negative   Bilirubin, UA negative    Ketones, UA negative    Spec Grav, UA 1.025 1.010 - 1.025   Blood, UA negative    pH, UA 5.5 5.0 - 8.0   Protein, UA Positive (A) Negative   Urobilinogen, UA 0.2 0.2 or 1.0 E.U./dL   Nitrite, UA negative    Leukocytes, UA Large (3+) (A) Negative   Assessment & Plan:   Problem List Items Addressed This Visit    UTI (urinary tract infection) - Primary    No blood today but story/UA consistent with UTI. Treat with macrobid. Pt requests diflucan to pharmacy as well. UCx sent.      Relevant Medications   nitrofurantoin, macrocrystal-monohydrate, (MACROBID) 100 MG capsule   fluconazole (DIFLUCAN) 150 MG tablet    Other Visit Diagnoses    Dysuria       Relevant Orders   POCT Urinalysis Dipstick (Automated) (Completed)   Urine Culture       Meds ordered this encounter  Medications  . nitrofurantoin, macrocrystal-monohydrate, (MACROBID) 100 MG capsule    Sig: Take 1 capsule (100 mg total) by mouth 2 (two) times daily.    Dispense:  14 capsule    Refill:  0  . fluconazole (DIFLUCAN) 150 MG tablet    Sig: Take 1 tablet (150 mg total) by mouth once for 1 dose.    Dispense:  1 tablet    Refill:  0   Orders Placed This Encounter  Procedures  . Urine Culture  . POCT Urinalysis Dipstick (Automated)    Follow up plan: Return if symptoms worsen or fail to improve.  Ria Bush, MD

## 2018-09-16 NOTE — Assessment & Plan Note (Signed)
No blood today but story/UA consistent with UTI. Treat with macrobid. Pt requests diflucan to pharmacy as well. UCx sent.

## 2018-09-16 NOTE — Patient Instructions (Signed)
You do have urine infection. I will send culture. Treat with macrobid twice daily for 1 week. Diflucan antifungal sent to take if needed after antibiotics.  Push fluids and rest. Let us know if not improving with treatment.  Urinary Tract Infection, Adult  A urinary tract infection (UTI) is an infection of any part of the urinary tract. The urinary tract includes the kidneys, ureters, bladder, and urethra. These organs make, store, and get rid of urine in the body. Your health care provider may use other names to describe the infection. An upper UTI affects the ureters and kidneys (pyelonephritis). A lower UTI affects the bladder (cystitis) and urethra (urethritis). What are the causes? Most urinary tract infections are caused by bacteria in your genital area, around the entrance to your urinary tract (urethra). These bacteria grow and cause inflammation of your urinary tract. What increases the risk? You are more likely to develop this condition if:  You have a urinary catheter that stays in place (indwelling).  You are not able to control when you urinate or have a bowel movement (you have incontinence).  You are female and you: ? Use a spermicide or diaphragm for birth control. ? Have low estrogen levels. ? Are pregnant.  You have certain genes that increase your risk (genetics).  You are sexually active.  You take antibiotic medicines.  You have a condition that causes your flow of urine to slow down, such as: ? An enlarged prostate, if you are female. ? Blockage in your urethra (stricture). ? A kidney stone. ? A nerve condition that affects your bladder control (neurogenic bladder). ? Not getting enough to drink, or not urinating often.  You have certain medical conditions, such as: ? Diabetes. ? A weak disease-fighting system (immunesystem). ? Sickle cell disease. ? Gout. ? Spinal cord injury. What are the signs or symptoms? Symptoms of this condition  include:  Needing to urinate right away (urgently).  Frequent urination or passing small amounts of urine frequently.  Pain or burning with urination.  Blood in the urine.  Urine that smells bad or unusual.  Trouble urinating.  Cloudy urine.  Vaginal discharge, if you are female.  Pain in the abdomen or the lower back. You may also have:  Vomiting or a decreased appetite.  Confusion.  Irritability or tiredness.  A fever.  Diarrhea. The first symptom in older adults may be confusion. In some cases, they may not have any symptoms until the infection has worsened. How is this diagnosed? This condition is diagnosed based on your medical history and a physical exam. You may also have other tests, including:  Urine tests.  Blood tests.  Tests for sexually transmitted infections (STIs). If you have had more than one UTI, a cystoscopy or imaging studies may be done to determine the cause of the infections. How is this treated? Treatment for this condition includes:  Antibiotic medicine.  Over-the-counter medicines to treat discomfort.  Drinking enough water to stay hydrated. If you have frequent infections or have other conditions such as a kidney stone, you may need to see a health care provider who specializes in the urinary tract (urologist). In rare cases, urinary tract infections can cause sepsis. Sepsis is a life-threatening condition that occurs when the body responds to an infection. Sepsis is treated in the hospital with IV antibiotics, fluids, and other medicines. Follow these instructions at home:  Medicines  Take over-the-counter and prescription medicines only as told by your health care provider.  If  you were prescribed an antibiotic medicine, take it as told by your health care provider. Do not stop using the antibiotic even if you start to feel better. General instructions  Make sure you: ? Empty your bladder often and completely. Do not hold urine  for long periods of time. ? Empty your bladder after sex. ? Wipe from front to back after a bowel movement if you are female. Use each tissue one time when you wipe.  Drink enough fluid to keep your urine pale yellow.  Keep all follow-up visits as told by your health care provider. This is important. Contact a health care provider if:  Your symptoms do not get better after 1-2 days.  Your symptoms go away and then return. Get help right away if you have:  Severe pain in your back or your lower abdomen.  A fever.  Nausea or vomiting. Summary  A urinary tract infection (UTI) is an infection of any part of the urinary tract, which includes the kidneys, ureters, bladder, and urethra.  Most urinary tract infections are caused by bacteria in your genital area, around the entrance to your urinary tract (urethra).  Treatment for this condition often includes antibiotic medicines.  If you were prescribed an antibiotic medicine, take it as told by your health care provider. Do not stop using the antibiotic even if you start to feel better.  Keep all follow-up visits as told by your health care provider. This is important. This information is not intended to replace advice given to you by your health care provider. Make sure you discuss any questions you have with your health care provider. Document Released: 04/11/2005 Document Revised: 01/09/2018 Document Reviewed: 01/09/2018 Elsevier Interactive Patient Education  2019 [employer].

## 2018-09-18 LAB — URINE CULTURE
MICRO NUMBER:: 269839
SPECIMEN QUALITY:: ADEQUATE

## 2018-09-19 ENCOUNTER — Other Ambulatory Visit: Payer: Self-pay | Admitting: Family Medicine

## 2018-09-19 DIAGNOSIS — Z1231 Encounter for screening mammogram for malignant neoplasm of breast: Secondary | ICD-10-CM

## 2018-09-21 ENCOUNTER — Other Ambulatory Visit: Payer: Self-pay | Admitting: Family Medicine

## 2018-09-24 ENCOUNTER — Other Ambulatory Visit: Payer: Self-pay | Admitting: Nurse Practitioner

## 2018-09-24 MED ORDER — LISINOPRIL 2.5 MG PO TABS: 2.5000 mg | ORAL_TABLET | Freq: Every day | ORAL | 3 refills | Status: DC

## 2018-09-24 NOTE — Telephone Encounter (Signed)
° ° ° °*  STAT* If patient is at the pharmacy, call can be transferred to refill team.   1. Which medications need to be refilled? (please list name of each medication and dose if known) lisinopril (PRINIVIL,ZESTRIL) 2.5 MG tablet  2. Which pharmacy/location (including street and city if local pharmacy) is medication to be sent to? [place], [place]  3. Do they need a 30 day or 90 day supply? [place]

## 2018-09-24 NOTE — Telephone Encounter (Signed)
Refill sent to [facility] for Lisinopril 2.5 mg one tablet daily for 90 day supply with 3 refills.  The patient has an upcoming appointment in Oct. 2020.

## 2018-10-30 ENCOUNTER — Ambulatory Visit: Payer: Medicare HMO

## 2018-11-03 ENCOUNTER — Other Ambulatory Visit: Payer: Self-pay | Admitting: Family Medicine

## 2018-11-04 NOTE — Telephone Encounter (Signed)
Eprescribed.

## 2018-11-25 DIAGNOSIS — R69 Illness, unspecified: Secondary | ICD-10-CM | POA: Diagnosis not present

## 2018-12-10 ENCOUNTER — Ambulatory Visit
Admission: RE | Admit: 2018-12-10 | Discharge: 2018-12-10 | Disposition: A | Discharge status: Home / Self Care | Payer: Medicare HMO | Source: Ambulatory Visit | Attending: Family Medicine | Admitting: Family Medicine

## 2018-12-10 ENCOUNTER — Other Ambulatory Visit: Payer: Self-pay

## 2018-12-10 DIAGNOSIS — Z1231 Encounter for screening mammogram for malignant neoplasm of breast: Secondary | ICD-10-CM

## 2018-12-10 LAB — HM MAMMOGRAPHY

## 2018-12-11 ENCOUNTER — Encounter: Payer: Self-pay | Admitting: Family Medicine

## 2019-01-26 ENCOUNTER — Other Ambulatory Visit: Payer: Self-pay | Admitting: Family Medicine

## 2019-01-26 NOTE — Telephone Encounter (Signed)
Name of Medication: Clonazepam Name of Pharmacy: [facility] or Written Date and Quantity: 11/19/18, #60 Last Office Visit and Type: 09/16/18, acute Next Office Visit and Type: 03/25/19, CPE Pt 2 Last Controlled Substance Agreement Date: 03/16/15 Last UDS: 03/16/15

## 2019-01-28 ENCOUNTER — Encounter: Payer: Self-pay | Admitting: Family Medicine

## 2019-01-28 ENCOUNTER — Ambulatory Visit (INDEPENDENT_AMBULATORY_CARE_PROVIDER_SITE_OTHER): Payer: Medicare HMO | Admitting: Family Medicine

## 2019-01-28 ENCOUNTER — Other Ambulatory Visit: Payer: Self-pay

## 2019-01-28 VITALS — BP 120/70 | HR 52 | Temp 97.9°F | Ht 61.5 in | Wt 165.0 lb

## 2019-01-28 DIAGNOSIS — M1711 Unilateral primary osteoarthritis, right knee: Secondary | ICD-10-CM | POA: Diagnosis not present

## 2019-01-28 MED ORDER — METHYLPREDNISOLONE ACETATE 40 MG/ML IJ SUSP
80.0000 mg | Freq: Once | INTRAMUSCULAR | Status: AC
  Administered 2019-01-28: 80 mg via INTRA_ARTICULAR

## 2019-01-28 NOTE — Telephone Encounter (Signed)
Eprescribed.

## 2019-01-28 NOTE — Progress Notes (Signed)
Ann White T. Ann Yeakel, MD Primary Care and Sports Medicine [facility] [place], [address] Phone: [phone]   FAX: [name]   MRN [id number]   Date of Birth: 03/19/1946  Visit Date: 01/28/2019   PCP: Ria Bush, MD   Referred by: Ria Bush, MD  Chief Complaint  Patient presents with   Knee Pain    right   Subjective:   Ann White is a 73 y.o. very pleasant female patient who presents with the following:  Nice lady with known R sided knee OA.  Off and on and has been doing a lot of work for her husband.  Sometimes it will swell.  I have seen her for such quite some time for some intermittent joint pains and arthritis, particular in the knees.  I did inject her right knee approximately 7 months ago.  She has had good success with intra-articular steroid injections in the past.  This has gotten quite a bit worse in the last 2 to 3 weeks when she has been more active.  CLINICAL DATA:  Right knee pain for the past 6 months. No known trauma.  EXAM: RIGHT KNEE 3 VIEWS  COMPARISON:  None.  FINDINGS: No fracture or dislocation. Marker tricompartmental degenerative change of the knee, worse within the lateral compartment with joint space loss, subchondral sclerosis and osteophytosis. There is minimal spurring of the tibial spines. No evidence of chondrocalcinosis. There is minimal enthesopathic change involving the superior pole the patella. No joint effusion. Regional soft tissues appear normal.  IMPRESSION: 1. No acute findings. 2. Moderate tricompartmental degenerative change of the knee, worse within the lateral compartment.  Past Medical History, Surgical History, Social History, Family History, Problem List, Medications, and Allergies have been reviewed and updated if relevant.  Patient Active Problem List   Diagnosis Date Noted   UTI (urinary tract  infection) 04/25/2018   Bilateral carotid artery stenosis 04/25/2018   Hyperlipidemia LDL goal <70 04/25/2018   Nonischemic cardiomyopathy ([place]) 07/17/2017   PAC (premature atrial contraction) 04/17/2017   Abnormal EKG 04/17/2017   Irregular heart beat 03/05/2017   Carotid stenosis 03/14/2016   Health maintenance examination 02/28/2016   Anemia due to stage 3 chronic kidney disease ([place]) 06/29/2015   Chronic right maxillary sinusitis 06/29/2015   Ectatic abdominal aorta ([place])    Encounter for therapeutic drug monitoring 05/30/2015   Personal history of DVT (deep vein thrombosis) 05/30/2015   Advanced care planning/counseling discussion 03/16/2015   CKD (chronic kidney disease) stage 3, GFR 30-59 ml/min (HCC) 08/19/2013   Hot flash, menopausal 02/24/2013   Obesity, Class I, BMI 30-34.9 02/24/2013   Medicare annual wellness visit, subsequent 08/27/2012   Hyperglycemia 71/69/6789   Lichen sclerosus et atrophicus    Acute non-recurrent maxillary sinusitis 07/21/2012   Irritable bowel syndrome with constipation    HTN (hypertension)    Seasonal allergic rhinitis    GERD (gastroesophageal reflux disease)    MDD (major depressive disorder), recurrent episode, moderate ([place])    History of diverticulitis of colon    Familial combined hyperlipidemia    PULMONARY FIBROSIS, POSTINFLAMMATORY 09/10/2008   Cervical spondylosis without myelopathy 09/09/2008    Past Medical History:  Diagnosis Date   Allergy    Anxiety    Arthritis    Cardiomyopathy ([place])    a. 04/2017 Echo: EF 35-40%, diff HK, mild MR, mildly dil LA, nl RV fxn.  Carotid stenosis    a. Bilateral 40-59%, rpt 1 yr (02/2016); b. 03/2017 Carotid U/S: RICA 21-19%, LICA 4-17%, EYCX>44% - overall stable.   Cervical spondylosis    s/p spine injections   Depression    Ectatic abdominal aorta ([place]) 2016   rpt [place] 5 yrs   GERD (gastroesophageal reflux disease)    History of chicken pox     History of diverticulitis of colon    HLD (hyperlipidemia)    HTN (hypertension)    Irritable bowel syndrome with constipation    Lichen sclerosus et atrophicus    Obesity    Premature atrial contraction    a. 04/2017 48hr Holter: Predominant rhythm - sinus. Rare PVC's, freq PAC's with freq atrial runs up to 34 beats-->metoprolol started.   Seasonal allergic rhinitis     Past Surgical History:  Procedure Laterality Date   bladder tack  1990s   BREAST BIOPSY Right 03/05/2012    benign   CARPAL TUNNEL RELEASE  2004, 2013   bilateral   CHOLECYSTECTOMY  1990s   COLONOSCOPY  2002   COLONOSCOPY  06/2013   mod diverticulosis, 3 tubular adenomas, int hem rpt 5 yrs [facility])   COLONOSCOPY  06/2018   TA,c olonic angiodysplastic lesion, mod diverticulosis, rpt 5 yrs [facility])   DEXA  07/2011   normal, T score -0.3   ESOPHAGOGASTRODUODENOSCOPY ENDOSCOPY  2004   normal ([place])   TOTAL ABDOMINAL HYSTERECTOMY W/ BILATERAL SALPINGOOPHORECTOMY  1990s   complete, dysmenorrhea and fibroids    Social History   Socioeconomic History   Marital status: Married    Spouse name: Not on file   Number of children: Not on file   Years of education: Not on file   Highest education level: Not on file  Occupational History   Not on file  Social Needs   Financial resource strain: Not on file   Food insecurity    Worry: Not on file    Inability: Not on file   Transportation needs    Medical: Not on file    Non-medical: Not on file  Tobacco Use   Smoking status: Former Smoker    Packs/day: 0.25    Years: 3.00    Pack years: 0.75    Types: Cigarettes    Quit date: 1990    Years since quitting: 30.5   Smokeless tobacco: Never Used   Tobacco comment: minimal smoking history  Substance and Sexual Activity   Alcohol use: No    Alcohol/week: 0.0 standard drinks   Drug use: No   Sexual activity: Never  Lifestyle   Physical activity    Days per week: Not on file     Minutes per session: Not on file   Stress: Not on file  Relationships   Social connections    Talks on phone: Not on file    Gets together: Not on file    Attends religious service: Not on file    Active member of club or organization: Not on file    Attends meetings of clubs or organizations: Not on file    Relationship status: Not on file   Intimate partner violence    Fear of current or ex partner: Not on file    Emotionally abused: Not on file    Physically abused: Not on file    Forced sexual activity: Not on file  Other Topics Concern   Not on file  Social History Narrative   Lives with husband and 2 cats.  2 grown children, 5 grandchildren.   Occupation: retired, worked in [employer]   Activity: no regular exercise.  Does walk in summer   Diet: fruits/vegetables daily, good water.    Family History  Problem Relation Age of Onset   CAD Mother 59       MI   Hypertension Mother    Parkinson's disease Father    Diabetes Father    CAD Father 69       MI   Hyperlipidemia Sister    Cancer Neg Hx    Stroke Neg Hx    Colon cancer Neg Hx    Breast cancer Neg Hx    Colon polyps Neg Hx    Esophageal cancer Neg Hx    Stomach cancer Neg Hx    Rectal cancer Neg Hx     Allergies  Allergen Reactions   Sulfa Antibiotics Swelling    Causes mouth to swell   Tricor [Fenofibrate] Other (See Comments)    myalgias   Zetia [Ezetimibe] Other (See Comments)    Dizziness   Contrast Media [Iodinated Diagnostic Agents] Other (See Comments)    Oral contrast caused mouth blisters   Lipitor [Atorvastatin] Other (See Comments)    myalgias   Pravastatin Other (See Comments)    myalgias    Medication list reviewed and updated in full in [place].  GEN: No fevers, chills. Nontoxic. Primarily MSK c/o today. MSK: Detailed in the HPI GI: tolerating PO intake without difficulty Neuro: No numbness, parasthesias, or tingling associated. Otherwise the pertinent  positives of the ROS are noted above.   Objective:   BP 120/70    Pulse (!) 52    Temp 97.9 F (36.6 C) (Skin)    Ht 5' 1.5" (1.562 m)    Wt 165 lb (74.8 kg)    SpO2 97%    BMI 30.67 kg/m    GEN: WDWN, NAD, Non-toxic, Alert & Oriented x 3 HEENT: Atraumatic, Normocephalic.  Ears and Nose: No external deformity. EXTR: No clubbing/cyanosis/edema NEURO: Normal gait.  PSYCH: Normally interactive. Conversant. Not depressed or anxious appearing.  Calm demeanor.   Knee:  R Gait: Normal heel toe pattern ROM: 0-115 Effusion: neg Echymosis or edema: none Patellar tendon NT Painful PLICA: neg Patellar grind: negative Medial and lateral patellar facet loading: negative medial and lateral joint lines:NT Mcmurray's neg Flexion-pinch pain Varus and valgus stress: stable Lachman: neg Ant and Post drawer: neg Hip abduction, IR, ER: WNL Hip flexion str: 5/5 Hip abd: 5/5 Quad: 5/5 VMO atrophy:No Hamstring concentric and eccentric: 5/5   Radiology: No results found.  Assessment and Plan:     ICD-10-CM   1. Primary osteoarthritis of right knee  M17.11    Very pleasant lady with a arthritis exacerbation.  Thus far she has failed conservative management.  Aspiration/Injection Procedure Note Soniyah Mcglory Aug 05, 1945 Date of procedure: 01/28/2019  Procedure: Large Joint Aspiration / Injection of Knee, RIGHT Indications: Pain  Procedure Details Patient verbally consented to procedure. Risks (including potential rare risk of infection), benefits, and alternatives explained. Sterilely prepped with Chloraprep. Ethyl cholride used for anesthesia. 8 cc Lidocaine 1% mixed with 2 mL Depo-Medrol 40 mg injected using the anteromedial approach without difficulty. No complications with procedure and tolerated well. Patient had decreased pain post-injection. Medication: 2 mL of Depo-Medrol 40 mg, equaling Depo-Medrol 80 mg total   Follow-up: No follow-ups on file.  No orders of the defined  types were placed in this encounter.  No orders of  the defined types were placed in this encounter.   Signed,  [name]. Wallice [place]ville, MD   Outpatient Encounter Medications as of 01/28/2019  Medication Sig   Acetaminophen (ARTHRITIS PAIN RELIEF PO) Take by mouth.   Alirocumab (PRALUENT) 150 MG/ML SOAJ Inject 1 pen into the skin every 14 (fourteen) days.   Cholecalciferol (VITAMIN D3) 1000 UNITS CAPS Take 1 capsule (1,000 Units total) by mouth daily.   clonazePAM (KLONOPIN) 1 MG tablet Take 1 tablet by mouth twice daily as needed   Docusate Calcium (STOOL SOFTENER PO) Take by mouth daily.    gabapentin (NEURONTIN) 300 MG capsule TAKE ONE CAPSULE EVERY MORNING AND 2 CAPSULES AT BEDTIME   lisinopril (PRINIVIL,ZESTRIL) 2.5 MG tablet Take 1 tablet (2.5 mg total) by mouth daily.   MAGNESIUM CITRATE PO Take by mouth.   metoprolol succinate (TOPROL-XL) 25 MG 24 hr tablet TAKE 1/2 (ONE-HALF) TABLET BY MOUTH ONCE DAILY   Misc Natural Products (SINUS FORMULA PO) Take by mouth.   Multiple Vitamin (MULTIVITAMIN) tablet Take 1 tablet by mouth daily.   Omega-3 Fatty Acids (FISH OIL) 1000 MG CAPS Take one by mouth two times a day   pantoprazole (PROTONIX) 40 MG tablet TAKE 1 TABLET BY MOUTH ONCE DAILY   sertraline (ZOLOFT) 100 MG tablet Take 1 tablet by mouth once daily   traMADol (ULTRAM) 50 MG tablet Take 1 tablet by mouth three times daily as needed   vitamin B-12 (CYANOCOBALAMIN) 500 MCG tablet Take 500 mcg by mouth every other day.   [DISCONTINUED] clonazePAM (KLONOPIN) 1 MG tablet TAKE 1 TABLET BY MOUTH TWICE DAILY AS NEEDED   [DISCONTINUED] nitrofurantoin, macrocrystal-monohydrate, (MACROBID) 100 MG capsule Take 1 capsule (100 mg total) by mouth 2 (two) times daily. (Patient not taking: Reported on 01/28/2019)   No facility-administered encounter medications on file as of 01/28/2019.

## 2019-01-28 NOTE — Addendum Note (Signed)
Addended by: Emelia Salisbury C on: 01/28/2019 12:49 PM   Modules accepted: Orders

## 2019-02-03 ENCOUNTER — Other Ambulatory Visit: Payer: Self-pay | Admitting: Family Medicine

## 2019-03-13 ENCOUNTER — Telehealth: Payer: Self-pay

## 2019-03-13 ENCOUNTER — Ambulatory Visit: Payer: Medicare HMO

## 2019-03-13 NOTE — Telephone Encounter (Signed)
This nurse attempted to call patient three times at 10:25, 10:30, and 10:45 for AWV. Message was left each time. Also was unable to to reach spouse for his visit.

## 2019-03-16 ENCOUNTER — Other Ambulatory Visit: Payer: Self-pay | Admitting: Family Medicine

## 2019-03-16 DIAGNOSIS — N183 Chronic kidney disease, stage 3 unspecified: Secondary | ICD-10-CM

## 2019-03-16 DIAGNOSIS — E785 Hyperlipidemia, unspecified: Secondary | ICD-10-CM

## 2019-03-16 DIAGNOSIS — R739 Hyperglycemia, unspecified: Secondary | ICD-10-CM

## 2019-03-16 NOTE — Addendum Note (Signed)
Addended by: Ria Bush on: 03/16/2019 09:40 AM   Modules accepted: Orders

## 2019-03-18 ENCOUNTER — Other Ambulatory Visit (INDEPENDENT_AMBULATORY_CARE_PROVIDER_SITE_OTHER): Payer: Medicare HMO

## 2019-03-18 DIAGNOSIS — N183 Chronic kidney disease, stage 3 unspecified: Secondary | ICD-10-CM

## 2019-03-18 DIAGNOSIS — E785 Hyperlipidemia, unspecified: Secondary | ICD-10-CM

## 2019-03-18 DIAGNOSIS — R739 Hyperglycemia, unspecified: Secondary | ICD-10-CM | POA: Diagnosis not present

## 2019-03-18 LAB — CBC WITH DIFFERENTIAL/PLATELET
Basophils Absolute: 0 10*3/uL (ref 0.0–0.1)
Basophils Relative: 0.8 % (ref 0.0–3.0)
Eosinophils Absolute: 0.2 10*3/uL (ref 0.0–0.7)
Eosinophils Relative: 4.7 % (ref 0.0–5.0)
HCT: 32.9 % — ABNORMAL LOW (ref 36.0–46.0)
Hemoglobin: 11 g/dL — ABNORMAL LOW (ref 12.0–15.0)
Lymphocytes Relative: 38.9 % (ref 12.0–46.0)
Lymphs Abs: 1.6 10*3/uL (ref 0.7–4.0)
MCHC: 33.6 g/dL (ref 30.0–36.0)
MCV: 88.7 fl (ref 78.0–100.0)
Monocytes Absolute: 0.2 10*3/uL (ref 0.1–1.0)
Monocytes Relative: 4.5 % (ref 3.0–12.0)
Neutro Abs: 2.1 10*3/uL (ref 1.4–7.7)
Neutrophils Relative %: 51.1 % (ref 43.0–77.0)
Platelets: 177 10*3/uL (ref 150.0–400.0)
RBC: 3.71 Mil/uL — ABNORMAL LOW (ref 3.87–5.11)
RDW: 13.9 % (ref 11.5–15.5)
WBC: 4.2 10*3/uL (ref 4.0–10.5)

## 2019-03-18 LAB — LIPID PANEL
Cholesterol: 229 mg/dL — ABNORMAL HIGH (ref 0–200)
HDL: 46.5 mg/dL (ref >39.00)
NonHDL: 182.42
Total CHOL/HDL Ratio: 5
Triglycerides: 223 mg/dL — ABNORMAL HIGH (ref 0.0–149.0)
VLDL: 44.6 mg/dL — ABNORMAL HIGH (ref 0.0–40.0)

## 2019-03-18 LAB — COMPREHENSIVE METABOLIC PANEL
ALT: 9 U/L (ref 0–35)
AST: 17 U/L (ref 0–37)
Albumin: 4.4 g/dL (ref 3.5–5.2)
Alkaline Phosphatase: 38 U/L — ABNORMAL LOW (ref 39–117)
BUN: 24 mg/dL — ABNORMAL HIGH (ref 6–23)
CO2: 28 mEq/L (ref 19–32)
Calcium: 9.4 mg/dL (ref 8.4–10.5)
Chloride: 105 mEq/L (ref 96–112)
Creatinine, Ser: 1.25 mg/dL — ABNORMAL HIGH (ref 0.40–1.20)
GFR: 42.14 mL/min — ABNORMAL LOW (ref >60.00)
Glucose, Bld: 88 mg/dL (ref 70–99)
Potassium: 4.8 mEq/L (ref 3.5–5.1)
Sodium: 141 mEq/L (ref 135–145)
Total Bilirubin: 0.3 mg/dL (ref 0.2–1.2)
Total Protein: 7 g/dL (ref 6.0–8.3)

## 2019-03-18 LAB — LDL CHOLESTEROL, DIRECT: Direct LDL: 132 mg/dL

## 2019-03-18 LAB — VITAMIN D 25 HYDROXY (VIT D DEFICIENCY, FRACTURES): VITD: 44.34 ng/mL (ref 30.00–100.00)

## 2019-03-18 LAB — HEMOGLOBIN A1C: Hgb A1c MFr Bld: 6.4 % (ref 4.6–6.5)

## 2019-03-19 LAB — PARATHYROID HORMONE, INTACT (NO CA): PTH: 49 pg/mL (ref 14–64)

## 2019-03-25 ENCOUNTER — Encounter: Payer: Self-pay | Admitting: Family Medicine

## 2019-03-25 ENCOUNTER — Ambulatory Visit (INDEPENDENT_AMBULATORY_CARE_PROVIDER_SITE_OTHER): Payer: Medicare HMO | Admitting: Family Medicine

## 2019-03-25 ENCOUNTER — Other Ambulatory Visit: Payer: Self-pay

## 2019-03-25 VITALS — BP 132/70 | HR 48 | Temp 97.7°F | Ht 61.0 in | Wt 160.1 lb

## 2019-03-25 DIAGNOSIS — K219 Gastro-esophageal reflux disease without esophagitis: Secondary | ICD-10-CM

## 2019-03-25 DIAGNOSIS — Z23 Encounter for immunization: Secondary | ICD-10-CM

## 2019-03-25 DIAGNOSIS — R011 Cardiac murmur, unspecified: Secondary | ICD-10-CM

## 2019-03-25 DIAGNOSIS — I6523 Occlusion and stenosis of bilateral carotid arteries: Secondary | ICD-10-CM

## 2019-03-25 DIAGNOSIS — Z0001 Encounter for general adult medical examination with abnormal findings: Secondary | ICD-10-CM | POA: Diagnosis not present

## 2019-03-25 DIAGNOSIS — E7849 Other hyperlipidemia: Secondary | ICD-10-CM

## 2019-03-25 DIAGNOSIS — E669 Obesity, unspecified: Secondary | ICD-10-CM

## 2019-03-25 DIAGNOSIS — Z7189 Other specified counseling: Secondary | ICD-10-CM

## 2019-03-25 DIAGNOSIS — K581 Irritable bowel syndrome with constipation: Secondary | ICD-10-CM

## 2019-03-25 DIAGNOSIS — R7303 Prediabetes: Secondary | ICD-10-CM

## 2019-03-25 DIAGNOSIS — D631 Anemia in chronic kidney disease: Secondary | ICD-10-CM

## 2019-03-25 DIAGNOSIS — F331 Major depressive disorder, recurrent, moderate: Secondary | ICD-10-CM

## 2019-03-25 DIAGNOSIS — I10 Essential (primary) hypertension: Secondary | ICD-10-CM

## 2019-03-25 DIAGNOSIS — Z0000 Encounter for general adult medical examination without abnormal findings: Secondary | ICD-10-CM

## 2019-03-25 DIAGNOSIS — N183 Chronic kidney disease, stage 3 unspecified: Secondary | ICD-10-CM

## 2019-03-25 MED ORDER — SERTRALINE HCL 100 MG PO TABS: 100.0000 mg | ORAL_TABLET | Freq: Every day | ORAL | 3 refills | Status: DC

## 2019-03-25 NOTE — Assessment & Plan Note (Addendum)
Chronic, stable. Continue sertraline and klonopin. PHQ9 and GAD7 low scores.

## 2019-03-25 NOTE — Assessment & Plan Note (Signed)
Stable period.  

## 2019-03-25 NOTE — Assessment & Plan Note (Signed)
Presumed. Stable. Continue to monitor.

## 2019-03-25 NOTE — Assessment & Plan Note (Signed)

## 2019-03-25 NOTE — Assessment & Plan Note (Signed)
Update carotid US.  

## 2019-03-25 NOTE — Assessment & Plan Note (Signed)
Chronic, stable. Continue current regimen. 

## 2019-03-25 NOTE — Assessment & Plan Note (Signed)
Reviewed A1c with patient, encouraged limiting added sugars in diet.

## 2019-03-25 NOTE — Assessment & Plan Note (Addendum)
Weight loss noted. Encouraged healthy diet and lifestyle choices to attain sustainable weight loss.

## 2019-03-25 NOTE — Assessment & Plan Note (Signed)
Appreciate lipid clinic care. Continue alirocumab injections.  The 10-year ASCVD risk score Mikey Bussing DC Brooke Bonito., et al., 2013) is: 15.7%   Values used to calculate the score:     Age: 73 years     Sex: Female     Is Non-Hispanic African American: No     Diabetic: No     Tobacco smoker: No     Systolic Blood Pressure: [id number] mmHg     Is BP treated: Yes     HDL Cholesterol: 46.5 mg/dL     Total Cholesterol: 229 mg/dL

## 2019-03-25 NOTE — Assessment & Plan Note (Signed)
Again reviewed with patient. Encouraged good hydration status, avoiding nephrotoxic agents.

## 2019-03-25 NOTE — Progress Notes (Signed)
This visit was conducted in person.  BP 132/70 (BP Location: Left Arm, Patient Position: Sitting, Cuff Size: Normal)   Pulse (!) 48   Temp 97.7 F (36.5 C) (Tympanic)   Ht 5\' 1"  (1.549 m)   Wt 160 lb 2 oz (72.6 kg)   SpO2 95%   BMI 30.26 kg/m    CC: AMW/CPE Subjective:    Patient ID: Ann White, female    DOB: 04/18/1946, 73 y.o.   MRN: [id number]  HPI: Ann White is a 73 y.o. female presenting on 03/25/2019 for Medicare Wellness   Our health advisor was unable to reach patient via phone this year for medicare wellness visit.    Hearing Screening   125Hz  250Hz  500Hz  1000Hz  2000Hz  3000Hz  4000Hz  6000Hz  8000Hz   Right ear:   20 25 25   0    Left ear:   20 20 20  20       Visual Acuity Screening   Right eye Left eye Both eyes  Without correction: 20/20 20/25 20/20   With correction:         Office Visit from 03/25/2019 in [place] at [facility] Total Score  0      Fall Risk  03/06/2018 02/26/2017 02/21/2016 03/16/2015 08/28/2013  Falls in the past year? No No No No No     Preventative: Colon cancer screening - colonoscopy 06/2013 mod diverticulosis, 3 tubular adenomas, int hem rpt 5 yrs [facility] Plan)  COLONOSCOPY 06/2018 - TA, colonic angiodysplastic lesion, mod diverticulosis, rpt 5 yrs [facility] Plan)  Well woman - with prior PCP Dr. Rocky Link. S/p hysterectomy and oophorectomy for benign reason. 1990s  Mammogram - 11/2018 DEXA 2013 - WNL Flu shotyearly Pneumovax 2014.prevnar 08/2013 Tdap 2014 [id number]. shingrix - discussed - high deductible so would be expensive.  Advanced directives: has set up through lawyer. Husband then daughter are HCPOA. Asked to bring me copy. Seat belt use discussed Sunscreen use discussed. No changing moles on skin.  Non smokers No EtOH use.  Dentist Q6 mo Eye exam yearly Bowel - no constipation  Bladder - no incontinence   Lives with husband and 2 cats. 2 grown children, 5 grandchildren  Occupation: retired, worked  in [employer]  Activity:hasn't gone to [facility] recently  Diet: fruits/vegetables daily, good water     Relevant past medical, surgical, family and social history reviewed and updated as indicated. Interim medical history since our last visit reviewed. Allergies and medications reviewed and updated. Outpatient Medications Prior to Visit  Medication Sig Dispense Refill  . Acetaminophen (ARTHRITIS PAIN RELIEF PO) Take by mouth.    . Alirocumab (PRALUENT) 150 MG/ML SOAJ Inject 1 pen into the skin every 14 (fourteen) days.    . Cholecalciferol (VITAMIN D3) 1000 UNITS CAPS Take 1 capsule (1,000 Units total) by mouth daily.    . clonazePAM (KLONOPIN) 1 MG tablet Take 1 tablet by mouth twice daily as needed 60 tablet 0  . Docusate Calcium (STOOL SOFTENER PO) Take by mouth daily.     Marland Kitchen gabapentin (NEURONTIN) 300 MG capsule TAKE ONE CAPSULE EVERY MORNING AND 2 CAPSULES AT BEDTIME 270 capsule 2  . lisinopril (PRINIVIL,ZESTRIL) 2.5 MG tablet Take 1 tablet (2.5 mg total) by mouth daily. 90 tablet 3  . MAGNESIUM CITRATE PO Take by mouth.    . metoprolol succinate (TOPROL-XL) 25 MG 24 hr tablet TAKE 1/2 (ONE-HALF) TABLET BY MOUTH ONCE DAILY 45 tablet 3  . Misc Natural Products (SINUS FORMULA PO) Take by mouth.    Marland Kitchen  Multiple Vitamin (MULTIVITAMIN) tablet Take 1 tablet by mouth daily.    . Omega-3 Fatty Acids (FISH OIL) 1000 MG CAPS Take one by mouth two times a day    . pantoprazole (PROTONIX) 40 MG tablet TAKE 1 TABLET BY MOUTH ONCE DAILY 90 tablet 1  . traMADol (ULTRAM) 50 MG tablet Take 1 tablet by mouth three times daily as needed 120 tablet 0  . vitamin B-12 (CYANOCOBALAMIN) 500 MCG tablet Take 500 mcg by mouth every other day.    . sertraline (ZOLOFT) 100 MG tablet Take 1 tablet by mouth once daily 90 tablet 0   No facility-administered medications prior to visit.      Per HPI unless specifically indicated in ROS section below Review of Systems  Constitutional: Negative for activity change, appetite change,  chills, fatigue, fever and unexpected weight change.  HENT: Negative for hearing loss.   Eyes: Negative for visual disturbance.  Respiratory: Negative for cough, chest tightness, shortness of breath and wheezing.   Cardiovascular: Negative for chest pain, palpitations and leg swelling.  Gastrointestinal: Positive for abdominal pain (mild intermittent). Negative for abdominal distention, blood in stool, constipation, diarrhea, nausea and vomiting.  Genitourinary: Negative for difficulty urinating and hematuria.  Musculoskeletal: Negative for arthralgias, myalgias and neck pain.  Skin: Negative for rash.  Neurological: Negative for dizziness, seizures, syncope and headaches.  Hematological: Negative for adenopathy. Bruises/bleeds easily.  Psychiatric/Behavioral: Negative for dysphoric mood. The patient is not nervous/anxious.    Objective:    BP 132/70 (BP Location: Left Arm, Patient Position: Sitting, Cuff Size: Normal)   Pulse (!) 48   Temp 97.7 F (36.5 C) (Tympanic)   Ht 5\' 1"  (1.549 m)   Wt 160 lb 2 oz (72.6 kg)   SpO2 95%   BMI 30.26 kg/m   Wt Readings from Last 3 Encounters:  03/25/19 160 lb 2 oz (72.6 kg)  01/28/19 165 lb (74.8 kg)  09/16/18 162 lb 9 oz (73.7 kg)    Physical Exam Vitals signs and nursing note reviewed.  Constitutional:      General: She is not in acute distress.    Appearance: Normal appearance. She is well-developed. She is not ill-appearing.  HENT:     Head: Normocephalic and atraumatic.     Right Ear: Hearing, tympanic membrane, ear canal and external ear normal.     Left Ear: Hearing, tympanic membrane, ear canal and external ear normal.     Nose: Nose normal.     Mouth/Throat:     Mouth: Mucous membranes are moist.     Pharynx: Uvula midline. No oropharyngeal exudate or posterior oropharyngeal erythema.  Eyes:     General: No scleral icterus.    Conjunctiva/sclera: Conjunctivae normal.     Pupils: Pupils are equal, round, and reactive to light.   Neck:     Musculoskeletal: Normal range of motion and neck supple.     Vascular: Carotid bruit (left) present.  Cardiovascular:     Rate and Rhythm: Normal rate and regular rhythm.     Pulses: Normal pulses.          Radial pulses are 2+ on the right side and 2+ on the left side.     Heart sounds: Murmur (2/6 systolic at USB) present.  Pulmonary:     Effort: Pulmonary effort is normal. No respiratory distress.     Breath sounds: Normal breath sounds. No wheezing, rhonchi or rales.  Abdominal:     General: Abdomen is flat. Bowel sounds are  normal. There is no distension.     Palpations: Abdomen is soft. There is no mass.     Tenderness: There is no abdominal tenderness. There is no guarding or rebound.     Hernia: No hernia is present.  Musculoskeletal: Normal range of motion.  Lymphadenopathy:     Cervical: No cervical adenopathy.  Skin:    General: Skin is warm and dry.     Findings: No rash.  Neurological:     General: No focal deficit present.     Mental Status: She is alert and oriented to person, place, and time.     Comments:  CN grossly intact, station and gait intact Recall 3/3 Calculation 5/5 [id number]  Psychiatric:        Mood and Affect: Mood normal.        Behavior: Behavior normal.        Thought Content: Thought content normal.        Judgment: Judgment normal.       Results for orders placed or performed in visit on 03/18/19  Parathyroid hormone, intact (no Ca)  Result Value Ref Range   PTH 49 14 - 64 pg/mL  VITAMIN D 25 Hydroxy (Vit-D Deficiency, Fractures)  Result Value Ref Range   VITD 44.34 30.00 - 100.00 ng/mL  CBC with Differential/Platelet  Result Value Ref Range   WBC 4.2 4.0 - 10.5 K/uL   RBC 3.71 (L) 3.87 - 5.11 Mil/uL   Hemoglobin 11.0 (L) 12.0 - 15.0 g/dL   HCT 32.9 (L) 36.0 - 46.0 %   MCV 88.7 78.0 - 100.0 fl   MCHC 33.6 30.0 - 36.0 g/dL   RDW 13.9 11.5 - 15.5 %   Platelets 177.0 150.0 - 400.0 K/uL   Neutrophils Relative % 51.1 43.0  - 77.0 %   Lymphocytes Relative 38.9 12.0 - 46.0 %   Monocytes Relative 4.5 3.0 - 12.0 %   Eosinophils Relative 4.7 0.0 - 5.0 %   Basophils Relative 0.8 0.0 - 3.0 %   Neutro Abs 2.1 1.4 - 7.7 K/uL   Lymphs Abs 1.6 0.7 - 4.0 K/uL   Monocytes Absolute 0.2 0.1 - 1.0 K/uL   Eosinophils Absolute 0.2 0.0 - 0.7 K/uL   Basophils Absolute 0.0 0.0 - 0.1 K/uL  Hemoglobin A1c  Result Value Ref Range   Hgb A1c MFr Bld 6.4 4.6 - 6.5 %  Comprehensive metabolic panel  Result Value Ref Range   Sodium 141 135 - 145 mEq/L   Potassium 4.8 3.5 - 5.1 mEq/L   Chloride 105 96 - 112 mEq/L   CO2 28 19 - 32 mEq/L   Glucose, Bld 88 70 - 99 mg/dL   BUN 24 (H) 6 - 23 mg/dL   Creatinine, Ser 1.25 (H) 0.40 - 1.20 mg/dL   Total Bilirubin 0.3 0.2 - 1.2 mg/dL   Alkaline Phosphatase 38 (L) 39 - 117 U/L   AST 17 0 - 37 U/L   ALT 9 0 - 35 U/L   Total Protein 7.0 6.0 - 8.3 g/dL   Albumin 4.4 3.5 - 5.2 g/dL   Calcium 9.4 8.4 - 10.5 mg/dL   GFR 42.14 (L) >60.00 mL/min  Lipid panel  Result Value Ref Range   Cholesterol 229 (H) 0 - 200 mg/dL   Triglycerides 223.0 (H) 0.0 - 149.0 mg/dL   HDL 46.50 >39.00 mg/dL   VLDL 44.6 (H) 0.0 - 40.0 mg/dL   Total CHOL/HDL Ratio 5    NonHDL 182.42  LDL cholesterol, direct  Result Value Ref Range   Direct LDL 132.0 mg/dL   Assessment & Plan:   Problem List Items Addressed This Visit    Systolic murmur    Mild, newly noted today. Will continue to monitor for now.       Prediabetes    Reviewed A1c with patient, encouraged limiting added sugars in diet.       Obesity, Class I, BMI 30-34.9    Weight loss noted. Encouraged healthy diet and lifestyle choices to attain sustainable weight loss.       Medicare annual wellness visit, subsequent - Primary    I have personally reviewed the Medicare Annual Wellness questionnaire and have noted 1. The patient's medical and social history 2. Their use of alcohol, tobacco or illicit drugs 3. Their current medications and  supplements 4. The patient's functional ability including ADL's, fall risks, home safety risks and hearing or visual impairment. Cognitive function has been assessed and addressed as indicated.  5. Diet and physical activity 6. Evidence for depression or mood disorders The patients weight, height, BMI have been recorded in the chart. I have made referrals, counseling and provided education to the patient based on review of the above and I have provided the pt with a written personalized care plan for preventive services. Provider list updated.. See scanned questionairre as needed for further documentation. Reviewed preventative protocols and updated unless pt declined.       MDD (major depressive disorder), recurrent episode, moderate (HCC)    Chronic, stable. Continue sertraline and klonopin. PHQ9 and GAD7 low scores.       Relevant Medications   sertraline (ZOLOFT) 100 MG tablet   Irritable bowel syndrome with constipation    Stable period.       HTN (hypertension)    Chronic, stable. Continue current regimen.       Health maintenance examination    Preventative protocols reviewed and updated unless pt declined. Discussed healthy diet and lifestyle.       GERD (gastroesophageal reflux disease)    Managed with PPI.      Familial combined hyperlipidemia    Appreciate lipid clinic care. Continue alirocumab injections.  The 10-year ASCVD risk score Mikey Bussing DC Brooke Bonito., et al., 2013) is: 15.7%   Values used to calculate the score:     Age: 17 years     Sex: Female     Is Non-Hispanic African American: No     Diabetic: No     Tobacco smoker: No     Systolic Blood Pressure: [id number] mmHg     Is BP treated: Yes     HDL Cholesterol: 46.5 mg/dL     Total Cholesterol: 229 mg/dL       CKD (chronic kidney disease) stage 3, GFR 30-59 ml/min (HCC)    Again reviewed with patient. Encouraged good hydration status, avoiding nephrotoxic agents.       Bilateral carotid artery stenosis     Update carotid US.       Relevant Orders   VAS US CAROTID   Anemia due to stage 3 chronic kidney disease ([place])    Presumed. Stable. Continue to monitor.       Advanced care planning/counseling discussion    Advanced directives: has set up through lawyer. Husband then daughter are HCPOA. Asked to bring me copy.       Other Visit Diagnoses    Need for influenza vaccination       Relevant Orders  Flu Vaccine QUAD High Dose(Fluad)       Meds ordered this encounter  Medications  . sertraline (ZOLOFT) 100 MG tablet    Sig: Take 1 tablet (100 mg total) by mouth daily.    Dispense:  90 tablet    Refill:  3   Orders Placed This Encounter  Procedures  . Flu Vaccine QUAD High Dose(Fluad)    Patient instructions: Flu shot today Bring [place] copy of your advanced directives to update chart.  If interested, check with pharmacy about new 2 shot shingles series (shingrix).  We will check carotid ultrasound.  You are doing well today Return as needed or in 6 months for follow up visit.   Follow up plan: Return in about 6 months (around 09/22/2019) for follow up visit.  Ria Bush, MD

## 2019-03-25 NOTE — Patient Instructions (Addendum)
Flu shot today Bring [place] copy of your advanced directives to update chart.  If interested, check with pharmacy about new 2 shot shingles series (shingrix).  We will check carotid ultrasound.  You are doing well today Return as needed or in 6 months for follow up visit.   Health Maintenance After Age 72 After age 26, you are at a higher risk for certain long-term diseases and infections as well as injuries from falls. Falls are a major cause of broken bones and head injuries in people who are older than age 109. Getting regular preventive care can help to keep you healthy and well. Preventive care includes getting regular testing and making lifestyle changes as recommended by your health care provider. Talk with your health care provider about:  Which screenings and tests you should have. A screening is a test that checks for a disease when you have no symptoms.  A diet and exercise plan that is right for you. What should I know about screenings and tests to prevent falls? Screening and testing are the best ways to find a health problem early. Early diagnosis and treatment give you the best chance of managing medical conditions that are common after age 74. Certain conditions and lifestyle choices may make you more likely to have a fall. Your health care provider may recommend:  Regular vision checks. Poor vision and conditions such as cataracts can make you more likely to have a fall. If you wear glasses, make sure to get your prescription updated if your vision changes.  Medicine review. Work with your health care provider to regularly review all of the medicines you are taking, including over-the-counter medicines. Ask your health care provider about any side effects that may make you more likely to have a fall. Tell your health care provider if any medicines that you take make you feel dizzy or sleepy.  Osteoporosis screening. Osteoporosis is a condition that causes the bones to get weaker. This  can make the bones weak and cause them to break more easily.  Blood pressure screening. Blood pressure changes and medicines to control blood pressure can make you feel dizzy.  Strength and balance checks. Your health care provider may recommend certain tests to check your strength and balance while standing, walking, or changing positions.  Foot health exam. Foot pain and numbness, as well as not wearing proper footwear, can make you more likely to have a fall.  Depression screening. You may be more likely to have a fall if you have a fear of falling, feel emotionally low, or feel unable to do activities that you used to do.  Alcohol use screening. Using too much alcohol can affect your balance and may make you more likely to have a fall. What actions can I take to lower my risk of falls? General instructions  Talk with your health care provider about your risks for falling. Tell your health care provider if: ? You fall. Be sure to tell your health care provider about all falls, even ones that seem minor. ? You feel dizzy, sleepy, or off-balance.  Take over-the-counter and prescription medicines only as told by your health care provider. These include any supplements.  Eat a healthy diet and maintain a healthy weight. A healthy diet includes low-fat dairy products, low-fat (lean) meats, and fiber from whole grains, beans, and lots of fruits and vegetables. Home safety  Remove any tripping hazards, such as rugs, cords, and clutter.  Install safety equipment such as grab bars  in bathrooms and safety rails on stairs.  Keep rooms and walkways well-lit. Activity   Follow a regular exercise program to stay fit. This will help you maintain your balance. Ask your health care provider what types of exercise are appropriate for you.  If you need a cane or walker, use it as recommended by your health care provider.  Wear supportive shoes that have nonskid soles. Lifestyle  Do not drink  alcohol if your health care provider tells you not to drink.  If you drink alcohol, limit how much you have: ? 0-1 drink a day for women. ? 0-2 drinks a day for men.  Be aware of how much alcohol is in your drink. In the [place] one drink equals one typical bottle of beer (12 oz), one-half glass of wine (5 oz), or one shot of hard liquor (1 oz).  Do not use any products that contain nicotine or tobacco, such as cigarettes and e-cigarettes. If you need help quitting, ask your health care provider. Summary  Having a healthy lifestyle and getting preventive care can help to protect your health and wellness after age 1.  Screening and testing are the best way to find a health problem early and help you avoid having a fall. Early diagnosis and treatment give you the best chance for managing medical conditions that are more common for people who are older than age 12.  Falls are a major cause of broken bones and head injuries in people who are older than age 68. Take precautions to prevent a fall at home.  Work with your health care provider to learn what changes you can make to improve your health and wellness and to prevent falls. This information is not intended to replace advice given to you by your health care provider. Make sure you discuss any questions you have with your health care provider. Document Released: 05/15/2017 Document Revised: 10/23/2018 Document Reviewed: 05/15/2017 Elsevier Patient Education  2020 [employer].

## 2019-03-25 NOTE — Assessment & Plan Note (Deleted)
Update carotid US.  

## 2019-03-25 NOTE — Assessment & Plan Note (Signed)
Managed with PPI.

## 2019-03-25 NOTE — Assessment & Plan Note (Signed)
Advanced directives: has set up through lawyer. Husband then daughter are HCPOA. Asked to bring me copy. 

## 2019-03-25 NOTE — Assessment & Plan Note (Signed)
Mild, newly noted today. Will continue to monitor for now.

## 2019-03-25 NOTE — Assessment & Plan Note (Signed)
Preventative protocols reviewed and updated unless pt declined. Discussed healthy diet and lifestyle.  

## 2019-03-26 ENCOUNTER — Other Ambulatory Visit: Payer: Self-pay | Admitting: Family Medicine

## 2019-03-28 ENCOUNTER — Other Ambulatory Visit: Payer: Self-pay | Admitting: Family Medicine

## 2019-03-30 NOTE — Telephone Encounter (Signed)
Name of Medication: Clonazepam Name of Pharmacy: [facility] or Written Date and Quantity: 01/28/19, #60 Last Office Visit and Type: 03/25/19,  AWV Next Office Visit and Type: 09/22/19, 6 mo f/u Last Controlled Substance Agreement Date: 03/16/15 Last UDS: 03/16/15

## 2019-03-31 NOTE — Telephone Encounter (Signed)
plz notify E prescribed 

## 2019-03-31 NOTE — Telephone Encounter (Signed)
Ms. Andreason notified refill has been sent to [facility] on [address].

## 2019-03-31 NOTE — Telephone Encounter (Signed)
Patient called and said she's out of her medication and would like it refilled as soon as possible.

## 2019-04-17 ENCOUNTER — Other Ambulatory Visit: Payer: Self-pay | Admitting: Family Medicine

## 2019-04-17 ENCOUNTER — Other Ambulatory Visit: Payer: Self-pay

## 2019-04-17 ENCOUNTER — Ambulatory Visit (INDEPENDENT_AMBULATORY_CARE_PROVIDER_SITE_OTHER): Payer: Medicare HMO

## 2019-04-17 DIAGNOSIS — I6523 Occlusion and stenosis of bilateral carotid arteries: Secondary | ICD-10-CM | POA: Diagnosis not present

## 2019-04-20 ENCOUNTER — Other Ambulatory Visit: Payer: Self-pay | Admitting: Family Medicine

## 2019-04-20 NOTE — Telephone Encounter (Signed)
Name of Medication: Tramadol Name of Pharmacy: [facility] [address] Last Fill or Written Date and Quantity: 11/04/18 #120 tabs with 0 refills Last Office Visit and Type: CPE on 03/25/19 Next Office Visit and Type: 6 Mo f/u on 09/22/19 Last Controlled Substance Agreement Date: 03/16/15 Last UDS:03/16/15

## 2019-04-21 NOTE — Telephone Encounter (Signed)
Patient called about the refill for Tramadol.  Patient's out of her medication.  Please notify patient when refill is done at [phone].

## 2019-04-22 NOTE — Telephone Encounter (Signed)
Pt notified as instructed by phone.

## 2019-04-22 NOTE — Telephone Encounter (Signed)
Erx plz notify pt 

## 2019-05-22 ENCOUNTER — Ambulatory Visit (INDEPENDENT_AMBULATORY_CARE_PROVIDER_SITE_OTHER): Payer: Medicare HMO | Admitting: Internal Medicine

## 2019-05-22 ENCOUNTER — Other Ambulatory Visit: Payer: Self-pay

## 2019-05-22 ENCOUNTER — Encounter: Payer: Self-pay | Admitting: Internal Medicine

## 2019-05-22 ENCOUNTER — Other Ambulatory Visit: Payer: Self-pay | Admitting: Internal Medicine

## 2019-05-22 VITALS — BP 140/62 | HR 50 | Ht 63.0 in | Wt 160.5 lb

## 2019-05-22 DIAGNOSIS — I6523 Occlusion and stenosis of bilateral carotid arteries: Secondary | ICD-10-CM

## 2019-05-22 DIAGNOSIS — I10 Essential (primary) hypertension: Secondary | ICD-10-CM | POA: Diagnosis not present

## 2019-05-22 DIAGNOSIS — I428 Other cardiomyopathies: Secondary | ICD-10-CM | POA: Diagnosis not present

## 2019-05-22 DIAGNOSIS — E782 Mixed hyperlipidemia: Secondary | ICD-10-CM

## 2019-05-22 NOTE — Progress Notes (Signed)
Follow-up Outpatient Visit Date: 05/22/2019  Primary Care Provider: Ria Bush, MD [place] [address]  Chief Complaint: Follow-up cardiomyopathy  HPI:  Ann White is a 73 y.o. year-old female with history of non-ischemic cardiomyopathy diagnosed in 04/2017 (MPI without perfusion defects), carotid artery stenosis, abdominal aorta ectasia, right calf DVT, hypertension, hyperlipidemia, and chronic kidney disease, who presents for follow-up of [name].  I last saw her in 04/2018, at which time Ann White reported feeling better with less fatigue.  She feels about the same today with mild fatigue at the [name].  She denies chest pain, shortness of breath, and palpitations.  She notes aching in her calves at the [name] and wonders if this could be related to varicose veins.  She has stopped taking aspirin due to significant bruising (even on 81 mg daily.  She is tolerating her other medications well.  --------------------------------------------------------------------------------------------------  Cardiovascular History & Procedures: Cardiovascular Problems:  Frequent PACs versus wandering pacemaker  Nonischemic cardiomyopathy  Carotid artery stenosis  Risk Factors:  Hypertension, hyperlipidemia, and age greater than 32  Cath/PCI:  None  CV Surgery:  None  EP Procedures and Devices:  48 hour Holter monitor (04/29/17): Sinus rhythm with frequent PACs and atrial runs, lasting up to 34 beats.  Non-Invasive Evaluation(s):  Carotid Doppler (04/17/2019): 40-59% stenosis in the carotid arteries bilaterally.  Antegrade flow in the vertebral arteries.  Normal subclavian flow.  TTE (04/24/18): Normal LV size.  LVEF 55-60% with normal wall motion.  Grade 2 diastolic dysfunction.  Mild left atrial enlargement.  Normal RV size and function.  Mild pulmonary hypertension (PASP 41 mmHg).  Exercise MPI (06/12/17): No evidence of  ischemia. LVEF 56%. Hypertensive blood pressure response to exercise.  Echo (04/29/17): Normal LV size with moderately reduced contraction. LVEF 35-40% with global hypokinesis. Mild MR. Mild left atrial enlargement. Normal RV size and function. Normal PA pressure.  Carotid Doppler (03/25/17):Heterogeneous plaque, bilaterally. Stable, 40-59% RICA stenosis. [id number] LICA stenosis. Stable, >50% RECA stenosis. Patent vertebral arteries with antegrade flow. Normal subclavian arteries, bilaterally.  Recent CV Pertinent Labs: Lab Results  Component Value Date   CHOL 229 (H) 03/18/2019   CHOL 212 (H) 05/26/2018   HDL 46.50 03/18/2019   HDL 45 05/26/2018   LDLCALC 120 (H) 05/26/2018   LDLDIRECT 132.0 03/18/2019   TRIG 223.0 (H) 03/18/2019   CHOLHDL 5 03/18/2019   INR 1.5 09/22/2015   INR 7.0 Repeated and verified X2. (HH) 06/13/2015   K 4.8 03/18/2019   MG 2.2 04/17/2017   BUN 24 (H) 03/18/2019   CREATININE 1.25 (H) 03/18/2019   CREATININE 1.19 07/26/2011    Past medical and surgical history were reviewed and updated in EPIC.  Current Meds  Medication Sig  . Acetaminophen (ARTHRITIS PAIN RELIEF PO) Take by mouth.  . Alirocumab (PRALUENT) 150 MG/ML SOAJ Inject 1 pen into the skin every 14 (fourteen) days.  . Cholecalciferol (VITAMIN D3) 1000 UNITS CAPS Take 1 capsule (1,000 Units total) by mouth daily.  . clonazePAM (KLONOPIN) 1 MG tablet Take 1 tablet by mouth twice daily as needed  . Docusate Calcium (STOOL SOFTENER PO) Take by mouth daily.   Marland Kitchen gabapentin (NEURONTIN) 300 MG capsule TAKE ONE CAPSULE EVERY MORNING AND 2 CAPSULES AT BEDTIME  . lisinopril (PRINIVIL,ZESTRIL) 2.5 MG tablet Take 1 tablet (2.5 mg total) by mouth daily.  Marland Kitchen MAGNESIUM CITRATE PO Take by mouth.  . metoprolol succinate (TOPROL-XL) 25 MG 24 hr  tablet TAKE 1/2 (ONE-HALF) TABLET BY MOUTH ONCE DAILY  . Misc Natural Products (SINUS FORMULA PO) Take by mouth.  . Multiple Vitamin (MULTIVITAMIN) tablet Take 1 tablet  by mouth daily.  . Omega-3 Fatty Acids (FISH OIL) 1000 MG CAPS Take one by mouth two times a day  . pantoprazole (PROTONIX) 40 MG tablet Take 1 tablet by mouth once daily  . sertraline (ZOLOFT) 100 MG tablet Take 1 tablet (100 mg total) by mouth daily.  . traMADol (ULTRAM) 50 MG tablet Take 1 tablet by mouth three times daily as needed  . vitamin B-12 (CYANOCOBALAMIN) 500 MCG tablet Take 500 mcg by mouth every other day.    Allergies: Sulfa antibiotics, Tricor [fenofibrate], Zetia [ezetimibe], Contrast media [iodinated diagnostic agents], Lipitor [atorvastatin], and Pravastatin  Social History   Tobacco Use  . Smoking status: Former Smoker    Packs/day: 0.25    Years: 3.00    Pack years: 0.75    Types: Cigarettes    Quit date: 1990    Years since quitting: 30.8  . Smokeless tobacco: Never Used  . Tobacco comment: minimal smoking history  Substance Use Topics  . Alcohol use: No    Alcohol/week: 0.0 standard drinks  . Drug use: No    Family History  Problem Relation Age of Onset  . CAD Mother 60       MI  . Hypertension Mother   . Parkinson's disease Father   . Diabetes Father   . CAD Father 81       MI  . Hyperlipidemia Sister   . Cancer Neg Hx   . Stroke Neg Hx   . Colon cancer Neg Hx   . Breast cancer Neg Hx   . Colon polyps Neg Hx   . Esophageal cancer Neg Hx   . Stomach cancer Neg Hx   . Rectal cancer Neg Hx     Review of Systems: A 12-system review of systems was performed and was negative except as noted in the HPI.  --------------------------------------------------------------------------------------------------  Physical Exam: BP 140/62 (BP Location: Left Arm, Patient Position: Sitting, Cuff Size: Normal)   Pulse (!) 50   Ht 5\' 3"  (1.6 m)   Wt 160 lb 8 oz (72.8 kg)   SpO2 98%   BMI 28.43 kg/m   General:  NAD HEENT: No conjunctival pallor or scleral icterus. Facemask in place. Neck: Supple without lymphadenopathy, thyromegaly, JVD, or HJR.  Lungs: Normal work of breathing. Clear to auscultation bilaterally without wheezes or crackles. Heart: Bradycardic but regular without murmurs, rubs, or gallops. Non-displaced PMI. Abd: Bowel sounds present. Soft, NT/ND without hepatosplenomegaly Ext: No lower extremity edema. Radial, PT, and DP pulses are 2+ bilaterally. Skin: Warm and dry without rash.  EKG:  Sinus bradycardia (HR 50 bpm) with low voltage and non-specific T-wave changes.  No significant change since 04/24/2018.  Lab Results  Component Value Date   WBC 4.2 03/18/2019   HGB 11.0 (L) 03/18/2019   HCT 32.9 (L) 03/18/2019   MCV 88.7 03/18/2019   PLT 177.0 03/18/2019    Lab Results  Component Value Date   NA 141 03/18/2019   K 4.8 03/18/2019   CL 105 03/18/2019   CO2 28 03/18/2019   BUN 24 (H) 03/18/2019   CREATININE 1.25 (H) 03/18/2019   GLUCOSE 88 03/18/2019   ALT 9 03/18/2019    Lab Results  Component Value Date   CHOL 229 (H) 03/18/2019   HDL 46.50 03/18/2019   LDLCALC 120 (H) 05/26/2018  LDLDIRECT 132.0 03/18/2019   TRIG 223.0 (H) 03/18/2019   CHOLHDL 5 03/18/2019    --------------------------------------------------------------------------------------------------  ASSESSMENT AND PLAN: NICM: Ann White appears euvolemic and well-compensated with NYHA class I-II symptoms.  She is asymptomatic with a resting heart rate in the low 50's.  We will continue her current medications, including metoprolol succinate 12.5 mg dily and lisinopril 2.5 mg daily.  Carotid artery stenosis: No focal neurologic symptoms reported with stable moderate bilateral ICA disease on recent Doppler exam.  We will continue alirocumab for secondary prevention.  Ann White is reluctant to restart antiplatelet therapy, given significant bruising with low-dose aspirin.  Hyperlipidemia: LDL much improved with alirocumab 150 mg every 2 weeks but still above goal of <70.  She has been intolerant of multiple statins and ezetimibe.  I will  reach out to our lipid clinic to see if there is any role for adding bempedoic acid to alirocumab, as well as to ensure that Ann White continues to receive pharmacy assistance for alirocumab.  Hypertension: Blood pressure borderline elevated today but typically lower.  We will defer medication changes today.  I have asked Ann White to check her blood pressure periodically at home and to let us know if is consistent above 140/90.  Follow-up: Return to clinic in 12 months.  Nelva Bush, MD 05/22/2019 10:29 AM

## 2019-05-22 NOTE — Patient Instructions (Signed)
Medication Instructions:  - Your physician recommends that you continue on your current medications as directed. Please refer to the Current Medication list given to you today.  *If you need a refill on your cardiac medications before your next appointment, please call your pharmacy*  Lab Work: - none ordered  If you have labs (blood work) drawn today and your tests are completely normal, you will receive your results only by: Marland Kitchen MyChart Message (if you have MyChart) OR . A paper copy in the mail If you have any lab test that is abnormal or we need to change your treatment, we will call you to review the results.  Testing/Procedures: - none ordered  Follow-Up: At [facility], you and your health needs are our priority.  As part of our continuing mission to provide you with exceptional heart care, we have created designated Provider Care Teams.  These Care Teams include your primary Cardiologist (physician) and Advanced Practice Providers (APPs -  Physician Assistants and Nurse Practitioners) who all work together to provide you with the care you need, when you need it.  Your next appointment:   12 months  The format for your next appointment:   In Person  Provider:    You may see Nelva Bush, MD or one of the following Advanced Practice Providers on your designated Care Team:    Murray Hodgkins, NP  Christell Faith, PA-C  Marrianne Mood, PA-C   Other Instructions - n/a

## 2019-05-23 ENCOUNTER — Encounter: Payer: Self-pay | Admitting: Internal Medicine

## 2019-05-23 DIAGNOSIS — I10 Essential (primary) hypertension: Secondary | ICD-10-CM | POA: Insufficient documentation

## 2019-05-25 ENCOUNTER — Other Ambulatory Visit: Payer: Self-pay | Admitting: Family Medicine

## 2019-05-25 ENCOUNTER — Telehealth: Payer: Self-pay | Admitting: Pharmacist

## 2019-05-25 DIAGNOSIS — E782 Mixed hyperlipidemia: Secondary | ICD-10-CM

## 2019-05-25 MED ORDER — NEXLETOL 180 MG PO TABS: 180.0000 mg | ORAL_TABLET | Freq: Every day | ORAL | 11 refills | Status: DC

## 2019-05-25 NOTE — Telephone Encounter (Signed)
Name of Medication: Clonazepam Name of Pharmacy: [facility] Last Fill or Written Date and Quantity: 03/31/19 Last Office Visit and Type: 03/25/19, AWV Next Office Visit and Type: 09/22/19, 6 mo f/u Last Controlled Substance Agreement Date: 03/16/15 Last UDS: 03/16/15

## 2019-05-25 NOTE — Telephone Encounter (Signed)
Praluent has been approved through 07/16/2019. Patient approved for $2500 through [place] [id number]  Group [id number] PCN [id number]  BIN [phone]

## 2019-05-25 NOTE — Telephone Encounter (Signed)
Patient has seen good reduction in her LDL with Praluent 150mg  q 14 days.  However, LDL is still above goal of <70 due to high baseline LDL. Patient TG are also elevated at 229.  Patient could not afford Vascepa in the past due to $150 coapy (looks like its is 100/month and her plan has a $250 deductible). She currently receives Praulent free through the PASS program.  Discussed Nelextol with patient.  Nexletol does not appear to be on her formulary. We will need to submit a non formulary request to insurance. We did discuss triglycerides. Patient is intolerant to fenofibrate. Vascepa would be an additional added expense (could get healthwell for all 3, but would certainly push patient into donut hole) Patient states she has lost 30 pounds, has been cutting back on breads, rice.  No soda, drinks natures twist- sugar free and eats ice cream a couple times a week.  Last labs are from September and has made dietary changes since then.  Will continue with dietary changes, trying to decrease sweets a little more. Will need to recheck in 3 months.  PASS program has not been approving patient with insurance lately. Will try [place] instead. Will apply at the end of Dec.  SSN added to old PASS application. Income has not changed.

## 2019-05-26 NOTE — Telephone Encounter (Signed)
Patient made aware of Nexletol approval. Will apply healthwell info to Praluent come the new year. Repeat labs in 3 months. Orders placed so patient can get labs at [facility].

## 2019-05-26 NOTE — Telephone Encounter (Signed)
Erx

## 2019-05-27 ENCOUNTER — Telehealth: Payer: Self-pay | Admitting: Internal Medicine

## 2019-05-27 NOTE — Telephone Encounter (Signed)
New Message:  Patient got a call from the pharmacy stating that she had a medication ready, and the price would be ~$300 out of pocket. She says she can not remember the name of the medication from the message. She will be going to her pharmacy to pick up the other two medications and will confirm with the pharmacy what the expensive one will be. The patient requests a call on her cell phone if you are unable to reach her on the home number

## 2019-05-27 NOTE — Telephone Encounter (Signed)
Left voicemail message requesting to have patient to call back to discuss.

## 2019-06-01 NOTE — Telephone Encounter (Signed)
Spoke with patient's husband who states she was able to get everything worked out with the cost of medication through [employer]. Advised him that if she has any further concerns to call the office and we would be happy to assist.

## 2019-07-01 ENCOUNTER — Telehealth: Payer: Self-pay | Admitting: Pharmacist

## 2019-07-01 NOTE — Telephone Encounter (Addendum)
Patient called about her Praluent. She has 6 injections left. Currently using PASS program but wont be able to use next year. She already has healthwell for the nexletol. Will apply that for her PCSK9i next year. She will call once she has 1 injection left and we will send Rx to pharamcy along with healthwell info (which should already be on file) Praluent PA renwal was denied because she is enrolled in the PASS program currently. Will resubmit come jan when she is no longer in patient assistance

## 2019-07-20 ENCOUNTER — Other Ambulatory Visit: Payer: Self-pay | Admitting: Family Medicine

## 2019-07-21 MED ORDER — PRALUENT 150 MG/ML ~~LOC~~ SOAJ: 1.0000 "pen " | SUBCUTANEOUS | 11 refills | Status: DC

## 2019-07-21 NOTE — Telephone Encounter (Signed)
Last filled on #60 with 0 refill.  LOV 03/25/2019 for AWE Next appointment on 09/22/2019 follow up.

## 2019-07-21 NOTE — Telephone Encounter (Signed)
Prior authorization for praluent 150mg  approved through 07/15/2020 Called patient to let her know. Left message for her to return call Will need to send in Rx and pharmacy will need to apply healthwell grant to rx (already has for nexletol). I believe patient said she had enough until the end of Feb

## 2019-07-21 NOTE — Telephone Encounter (Signed)
Patient made aware. New rx with healthwell info sent to pharmacy

## 2019-07-21 NOTE — Addendum Note (Signed)
Addended by: Marcelle Overlie D on: 07/21/2019 03:42 PM   Modules accepted: Orders

## 2019-07-22 NOTE — Telephone Encounter (Signed)
Is calling back in regards to refill request.  She has 2 tablets left and would like to pick this up today if possible from the pharmacy

## 2019-07-22 NOTE — Telephone Encounter (Signed)
ERx 

## 2019-07-29 DIAGNOSIS — Z8249 Family history of ischemic heart disease and other diseases of the circulatory system: Secondary | ICD-10-CM | POA: Diagnosis not present

## 2019-07-29 DIAGNOSIS — R69 Illness, unspecified: Secondary | ICD-10-CM | POA: Diagnosis not present

## 2019-07-29 DIAGNOSIS — E785 Hyperlipidemia, unspecified: Secondary | ICD-10-CM | POA: Diagnosis not present

## 2019-07-29 DIAGNOSIS — K219 Gastro-esophageal reflux disease without esophagitis: Secondary | ICD-10-CM | POA: Diagnosis not present

## 2019-07-29 DIAGNOSIS — G629 Polyneuropathy, unspecified: Secondary | ICD-10-CM | POA: Diagnosis not present

## 2019-07-29 DIAGNOSIS — M1990 Unspecified osteoarthritis, unspecified site: Secondary | ICD-10-CM | POA: Diagnosis not present

## 2019-07-29 DIAGNOSIS — I10 Essential (primary) hypertension: Secondary | ICD-10-CM | POA: Diagnosis not present

## 2019-07-29 DIAGNOSIS — K5900 Constipation, unspecified: Secondary | ICD-10-CM | POA: Diagnosis not present

## 2019-08-04 ENCOUNTER — Telehealth: Payer: Self-pay | Admitting: Internal Medicine

## 2019-08-04 DIAGNOSIS — E782 Mixed hyperlipidemia: Secondary | ICD-10-CM

## 2019-08-04 NOTE — Telephone Encounter (Signed)
Patient returning call.

## 2019-08-04 NOTE — Telephone Encounter (Signed)
Pt c/o medication issue:  1. Name of Medication: Nexletol  2. How are you currently taking this medication (dosage and times per day)? Stopped taking a week ago  3. Are you having a reaction (difficulty breathing--STAT)?   4. What is your medication issue? Make her eyes feel "funny", BP was 82/58(about a week ago) , felt dizzy. Patient states she has felt better since she stopped taking this medication.  Please call before 1:30 or after 3:30

## 2019-08-04 NOTE — Telephone Encounter (Signed)
As this is a fairly new medication, I have limited experience with Nexletol.  However, the reaction seems somewhat unusual.  I will forward this to the pharmacy team/lipid clinic to get their input, as they have been assisting with her lipid management.  It is okay to hold off on restarting Nexletol for now.  Ann Bush, MD [facility]

## 2019-08-04 NOTE — Telephone Encounter (Signed)
Spoke with patient. States she took Nexletol for about 1 month with no issues. Upon starting the 6th week, she began to experience dizziness, blood pressure drop (82/58), and "funny" feeling around her eyes. After about 1 week, patient decided to stop it and she no longer had those symptoms and her BP is now back up around 114/68. She was wondering if she should take a smaller dose or what was recommended. She is appreciative for the help in getting the medication. Routing to Dr End for further advice.

## 2019-08-04 NOTE — Telephone Encounter (Signed)
No answer. Left message to call back.   

## 2019-08-05 NOTE — Telephone Encounter (Signed)
Patient calling to check on the status of being call back

## 2019-08-05 NOTE — Telephone Encounter (Signed)
Called patient back and she verbalized understanding of Dr Darnelle Bos recommendations. She is aware we have forwarded to PHarmacy and will let her know any further updates. She will stop taking the Nexletol for now.

## 2019-08-06 MED ORDER — REPATHA PUSHTRONEX SYSTEM 420 MG/3.5ML ~~LOC~~ SOCT: 420.0000 mg | SUBCUTANEOUS | 0 refills | Status: DC

## 2019-08-06 NOTE — Telephone Encounter (Signed)
Noted partial response to Praluent 150mg  and intolerance to Nexletol.   Recommendation: 1. D/C Nexletol 2. START Repatha 420mg  Marvell every month x 2 doses (1st dose @ [facility] office with pharmacist) 3. Repeat fasting blood work the week of March/2   Drug name: Repatha Pushtronix    Strength: 420mg        Qty: 2 [id number]  Exp.Date: 12/2020  If LDL is improved with Repatha 420mg  samples,  we will change therapy from Praluent or Repatha and continue patient assistance from [employer]

## 2019-08-20 ENCOUNTER — Other Ambulatory Visit: Payer: Self-pay | Admitting: Family Medicine

## 2019-08-20 NOTE — Telephone Encounter (Signed)
Name of Medication: Tramadol Name of Pharmacy: [facility] or Written Date and Quantity: 04/23/19, #120 Last Office Visit and Type: 03/25/19, AWV Next Office Visit and Type: 09/22/19, 6 mo f/u Last Controlled Substance Agreement Date: 03/16/15 Last UDS: 03/16/15

## 2019-08-21 ENCOUNTER — Telehealth: Payer: Self-pay | Admitting: Internal Medicine

## 2019-08-21 NOTE — Telephone Encounter (Signed)
Pt c/o medication issue:  1. Name of Medication: Evolocumab with Infusor ([place]) 420 MG/3.5ML SOCT  2. How are you currently taking this medication (dosage and times per day)? Once every 30 days  3. Are you having a reaction (difficulty breathing--STAT)? no  4. What is your medication issue? Patient would like to know if she takes the shot before or after her lab work on 3/1.

## 2019-08-21 NOTE — Telephone Encounter (Signed)
Spoke with patient and she wanted to know if she should do second shot or wait until her upcoming labs. Reviewed that she should take it once every 30 days regardless of labs that are ordered. She verbalized understanding with no further questions at this time.

## 2019-08-22 ENCOUNTER — Other Ambulatory Visit: Payer: Self-pay | Admitting: Family Medicine

## 2019-08-23 NOTE — Telephone Encounter (Signed)
ERx 

## 2019-09-10 ENCOUNTER — Other Ambulatory Visit: Payer: Self-pay

## 2019-09-10 ENCOUNTER — Encounter: Payer: Self-pay | Admitting: Family Medicine

## 2019-09-10 ENCOUNTER — Encounter (INDEPENDENT_AMBULATORY_CARE_PROVIDER_SITE_OTHER): Payer: Self-pay

## 2019-09-10 ENCOUNTER — Ambulatory Visit (INDEPENDENT_AMBULATORY_CARE_PROVIDER_SITE_OTHER): Payer: Medicare HMO | Admitting: Family Medicine

## 2019-09-10 ENCOUNTER — Ambulatory Visit: Payer: Medicare HMO

## 2019-09-10 VITALS — BP 128/80 | HR 89 | Temp 97.8°F | Ht 61.0 in | Wt 155.5 lb

## 2019-09-10 DIAGNOSIS — M1711 Unilateral primary osteoarthritis, right knee: Secondary | ICD-10-CM

## 2019-09-10 MED ORDER — METHYLPREDNISOLONE ACETATE 40 MG/ML IJ SUSP
80.0000 mg | Freq: Once | INTRAMUSCULAR | Status: AC
  Administered 2019-09-10: 11:00:00 80 mg via INTRA_ARTICULAR

## 2019-09-10 NOTE — Progress Notes (Signed)
Ann White T. Darrnell Mangiaracina, MD Primary Care and Sports Medicine [facility] at [facility] [place], [address] Phone: [phone]  FAX: [place] - 74 y.o. female  MRN [id number]  Date of Birth: 1945-12-27  Visit Date: 09/10/2019  PCP: Ria Bush, MD  Referred by: Ria Bush, MD  Chief Complaint  Patient presents with  . Knee Pain    Bilateral but Right is worse    This visit occurred during the SARS-CoV-2 public health emergency.  Safety protocols were in place, including screening questions prior to the visit, additional usage of staff PPE, and extensive cleaning of exam room while observing appropriate contact time as indicated for disinfecting solutions.   Subjective:   Ann White is a 74 y.o. very pleasant female patient with Body mass index is 29.38 kg/m. who presents with the following:  She has some known right-sided moderate tricompartmental arthritis.  I did an intra-articular steroid injection on her 6 months ago, she had excellent relief of symptoms.  She is here for some further consultation and discussion about management with osteoarthritis of the knee. On her last corticosteroid injection she had about 6 to 8 months of excellent relief.  R knee.    Review of Systems is noted in the HPI, as appropriate   Objective:   BP 128/80   Pulse 89   Temp 97.8 F (36.6 C) (Temporal)   Ht 5\' 1"  (1.549 m)   Wt 155 lb 8 oz (70.5 kg)   SpO2 97%   BMI 29.38 kg/m   GEN: No acute distress; alert,appropriate. PULM: Breathing comfortably in no respiratory distress PSYCH: Normally interactive.    Right knee: Full extension.  Flexion to 120 degrees.  Notable tenderness at the patellar facets as well as the medial greater than lateral joint lines.  Pain with flexion pinch as well as McMurray's without mechanical symptoms.  Radiology: No results found.  Assessment and Plan:     ICD-10-CM     1. Primary osteoarthritis of right knee  M17.11    Continue with conservative care for right knee osteoarthritis.  We will do a therapeutic injection of corticosteroid today in her right knee.  Aspiration/Injection Procedure Note Zhania Quackenbush 05/20/1946 Date of procedure: 09/10/2019  Procedure: Large Joint Aspiration / Injection of Knee, R Indications: Pain  Procedure Details Patient verbally consented to procedure. Risks (including potential rare risk of infection), benefits, and alternatives explained. Sterilely prepped with Chloraprep. Ethyl cholride used for anesthesia. 8 cc Lidocaine 1% mixed with 2 mL Depo-Medrol 40 mg injected using the anteromedial approach without difficulty. No complications with procedure and tolerated well. Patient had decreased pain post-injection. Medication: 2 mL of Depo-Medrol 40 mg, equaling Depo-Medrol 80 mg total   Follow-up: No follow-ups on file.  No orders of the defined types were placed in this encounter.  Medications Discontinued During This Encounter  Medication Reason  . Alirocumab (PRALUENT) 150 MG/ML SOAJ Change in therapy   No orders of the defined types were placed in this encounter.   Signed,  [name]. Lenny Bouchillon, MD   Outpatient Encounter Medications as of 09/10/2019  Medication Sig  . Acetaminophen (ARTHRITIS PAIN RELIEF PO) Take by mouth.  . Cholecalciferol (VITAMIN D3) 1000 UNITS CAPS Take 1 capsule (1,000 Units total) by mouth daily.  . clonazePAM (KLONOPIN) 1 MG tablet Take 1 tablet by mouth twice daily as needed  . Docusate Calcium (STOOL SOFTENER PO) Take by mouth daily.   Marland Kitchen  Evolocumab with Infusor ([place]) 420 MG/3.5ML SOCT Inject 420 mg into the skin every 30 (thirty) days.  Marland Kitchen gabapentin (NEURONTIN) 300 MG capsule TAKE ONE CAPSULE EVERY MORNING AND 2 CAPSULES AT BEDTIME  . lisinopril (PRINIVIL,ZESTRIL) 2.5 MG tablet Take 1 tablet (2.5 mg total) by mouth daily.  Marland Kitchen MAGNESIUM CITRATE PO Take by  mouth.  . metoprolol succinate (TOPROL-XL) 25 MG 24 hr tablet Take 1/2 (one-half) tablet by mouth once daily  . Misc Natural Products (SINUS FORMULA PO) Take by mouth.  . Multiple Vitamin (MULTIVITAMIN) tablet Take 1 tablet by mouth daily.  . Omega-3 Fatty Acids (FISH OIL) 1000 MG CAPS Take one by mouth two times a day  . pantoprazole (PROTONIX) 40 MG tablet Take 1 tablet by mouth once daily  . sertraline (ZOLOFT) 100 MG tablet Take 1 tablet (100 mg total) by mouth daily.  . traMADol (ULTRAM) 50 MG tablet Take 1 tablet by mouth three times daily as needed  . vitamin B-12 (CYANOCOBALAMIN) 500 MCG tablet Take 500 mcg by mouth every other day.  . [DISCONTINUED] Alirocumab (PRALUENT) 150 MG/ML SOAJ Inject 1 pen into the skin every 14 (fourteen) days.   No facility-administered encounter medications on file as of 09/10/2019.

## 2019-09-10 NOTE — Addendum Note (Signed)
Addended by: [place] Kitten on: 09/10/2019 10:33 AM   Modules accepted: Orders

## 2019-09-14 DIAGNOSIS — E782 Mixed hyperlipidemia: Secondary | ICD-10-CM | POA: Diagnosis not present

## 2019-09-15 LAB — LIPID PANEL WITH LDL/HDL RATIO
Cholesterol, Total: 191 mg/dL (ref 100–199)
HDL: 59 mg/dL (ref >39)
LDL Chol Calc (NIH): 107 mg/dL — ABNORMAL HIGH (ref 0–99)
LDL/HDL Ratio: 1.8 ratio (ref 0.0–3.2)
Triglycerides: 144 mg/dL (ref 0–149)
VLDL Cholesterol Cal: 25 mg/dL (ref 5–40)

## 2019-09-16 ENCOUNTER — Encounter: Payer: Self-pay | Admitting: Pharmacist

## 2019-09-16 ENCOUNTER — Telehealth: Payer: Self-pay

## 2019-09-16 NOTE — Telephone Encounter (Signed)
-----   Message from Nelva Bush, MD sent at 09/15/2019  8:58 PM EST ----- LDL much improved but still a little above goal.  I recommend that Ms. Duchi continue her current medications, as she has been intolerant of statins and ezetimibe.  I will forward the results to our pharmacy team as well to see if there is a role for adding bempedoic acid or pursuing other pharmacotherapy.  Ms. Mcwethy should continue to work on controlling her cholesterol through diet and exercise as well.

## 2019-09-16 NOTE — Telephone Encounter (Signed)
Flow sheet not changed until we know if patient tolerates samples and we can complete a new PA.  We are working with her PA right now. Will update flow sheet when PA completed. Patient received update from me today 09/16/2019.

## 2019-09-16 NOTE — Telephone Encounter (Signed)
Spoke with patient in regards to results and recommendations. She verbalizes understanding. Pt states that she is willing to start rosuvastatin 5 mg 3x per week and requests a refill of her Repatha. Unsure which Repatha dosage to refill. Pt mentioned the every two weeks injections but was given a sample of the 30 day injections. Routing to pharmacy for clarification.   Encouraged patient to call back with any questions or concerns.

## 2019-09-16 NOTE — Telephone Encounter (Signed)
Leeroy Bock [facility]  09/16/2019 8:13 AM EST    Unfortunately pt stopped Nexletol in January due to reported dizziness. She is also intolerant to Zetia, lovastatin, atorvastatin, and pravastatin. Do not see that she has tried rosuvastatin - could see if pt is willing to try very low dose of rosuvastatin 5mg  3x per week in addition to continuing her Repatha to see if she tolerates lower frequency/intensity better.

## 2019-09-17 ENCOUNTER — Telehealth: Payer: Self-pay

## 2019-09-17 MED ORDER — REPATHA PUSHTRONEX SYSTEM 420 MG/3.5ML ~~LOC~~ SOCT: 420.0000 mg | SUBCUTANEOUS | 11 refills | Status: DC

## 2019-09-17 NOTE — Telephone Encounter (Signed)
Called and lmomed the pt that the repatha pushtronex was approved, rx sent, pt instructed to call back if unaffordable.

## 2019-09-22 ENCOUNTER — Ambulatory Visit (INDEPENDENT_AMBULATORY_CARE_PROVIDER_SITE_OTHER): Payer: Medicare HMO | Admitting: Family Medicine

## 2019-09-22 ENCOUNTER — Other Ambulatory Visit: Payer: Self-pay

## 2019-09-22 ENCOUNTER — Encounter: Payer: Self-pay | Admitting: Family Medicine

## 2019-09-22 VITALS — BP 126/82 | HR 88 | Temp 97.9°F | Ht 61.0 in | Wt 154.5 lb

## 2019-09-22 DIAGNOSIS — R011 Cardiac murmur, unspecified: Secondary | ICD-10-CM | POA: Diagnosis not present

## 2019-09-22 DIAGNOSIS — I428 Other cardiomyopathies: Secondary | ICD-10-CM | POA: Diagnosis not present

## 2019-09-22 DIAGNOSIS — E7849 Other hyperlipidemia: Secondary | ICD-10-CM

## 2019-09-22 DIAGNOSIS — E663 Overweight: Secondary | ICD-10-CM

## 2019-09-22 DIAGNOSIS — R7303 Prediabetes: Secondary | ICD-10-CM | POA: Diagnosis not present

## 2019-09-22 LAB — POCT GLYCOSYLATED HEMOGLOBIN (HGB A1C): Hemoglobin A1C: 5.5 % (ref 4.0–5.6)

## 2019-09-22 MED ORDER — CLONAZEPAM 1 MG PO TABS: 1.0000 mg | ORAL_TABLET | Freq: Two times a day (BID) | ORAL | 0 refills | Status: DC | PRN

## 2019-09-22 NOTE — Assessment & Plan Note (Signed)
Appreciate cardiology care.  °

## 2019-09-22 NOTE — Patient Instructions (Signed)
You are doing well today Klonopin refilled today A1c checked today.  Return as needed or in 6 months for physical/wellness visit

## 2019-09-22 NOTE — Assessment & Plan Note (Signed)
Not appreciated today.  

## 2019-09-22 NOTE — Assessment & Plan Note (Addendum)
Appreciate lipid clinic care. Now on praluent through them. Latest LDL 107.

## 2019-09-22 NOTE — Progress Notes (Signed)
This visit was conducted in person.  BP 126/82 (BP Location: Left Arm, Patient Position: Sitting, Cuff Size: Normal)   Pulse 88   Temp 97.9 F (36.6 C) (Temporal)   Ht 5\' 1"  (1.549 m)   Wt 154 lb 8 oz (70.1 kg)   SpO2 96%   BMI 29.19 kg/m    CC: 6 mo f/u visit Subjective:    Patient ID: Ann White, female    DOB: 01/08/1946, 74 y.o.   MRN: [id number]  HPI: Ann White is a 74 y.o. female presenting on 09/22/2019 for Follow-up (Here for 6 mo f/u.)   Completed covid vaccine!  Recently saw Dr Lorelei Pont s/p R knee injection with benefit.   Saw Dr End for non ischemic stable period on low dose ACEI and toprol XL. Also has stable moderate BICA disease. She is now on praluent 150mg  Q2 wks for cholesterol control.   Prediabetes - will update A1c. Almost 40 lb weight loss over the last 2 years. She has be trying - healthier diet.      Relevant past medical, surgical, family and social history reviewed and updated as indicated. Interim medical history since our last visit reviewed. Allergies and medications reviewed and updated. Outpatient Medications Prior to Visit  Medication Sig Dispense Refill  . Acetaminophen (ARTHRITIS PAIN RELIEF PO) Take by mouth.    . Cholecalciferol (VITAMIN D3) 1000 UNITS CAPS Take 1 capsule (1,000 Units total) by mouth daily.    Mariane Baumgarten Calcium (STOOL SOFTENER PO) Take by mouth daily.     . Evolocumab with Infusor ([place]) 420 MG/3.5ML SOCT Inject 420 mg into the skin every 30 (thirty) days. 3.6 mL 11  . gabapentin (NEURONTIN) 300 MG capsule TAKE ONE CAPSULE EVERY MORNING AND 2 CAPSULES AT BEDTIME 270 capsule 2  . lisinopril (PRINIVIL,ZESTRIL) 2.5 MG tablet Take 1 tablet (2.5 mg total) by mouth daily. 90 tablet 3  . MAGNESIUM CITRATE PO Take by mouth.    . metoprolol succinate (TOPROL-XL) 25 MG 24 hr tablet Take 1/2 (one-half) tablet by mouth once daily 45 tablet 3  . Misc Natural Products (SINUS FORMULA PO) Take by mouth.     . Multiple Vitamin (MULTIVITAMIN) tablet Take 1 tablet by mouth daily.    . Omega-3 Fatty Acids (FISH OIL) 1000 MG CAPS Take one by mouth two times a day    . pantoprazole (PROTONIX) 40 MG tablet Take 1 tablet by mouth once daily 90 tablet 3  . sertraline (ZOLOFT) 100 MG tablet Take 1 tablet (100 mg total) by mouth daily. 90 tablet 3  . traMADol (ULTRAM) 50 MG tablet Take 1 tablet by mouth three times daily as needed 120 tablet 0  . vitamin B-12 (CYANOCOBALAMIN) 500 MCG tablet Take 500 mcg by mouth every other day.    . clonazePAM (KLONOPIN) 1 MG tablet Take 1 tablet by mouth twice daily as needed 60 tablet 0   No facility-administered medications prior to visit.     Per HPI unless specifically indicated in ROS section below Review of Systems Objective:    BP 126/82 (BP Location: Left Arm, Patient Position: Sitting, Cuff Size: Normal)   Pulse 88   Temp 97.9 F (36.6 C) (Temporal)   Ht 5\' 1"  (1.549 m)   Wt 154 lb 8 oz (70.1 kg)   SpO2 96%   BMI 29.19 kg/m   Wt Readings from Last 3 Encounters:  09/22/19 154 lb 8 oz (70.1 kg)  09/10/19 155 lb 8  oz (70.5 kg)  05/22/19 160 lb 8 oz (72.8 kg)    Physical Exam Vitals and nursing note reviewed.  Constitutional:      Appearance: Normal appearance. She is not ill-appearing.  Eyes:     Extraocular Movements: Extraocular movements intact.     Pupils: Pupils are equal, round, and reactive to light.  Cardiovascular:     Rate and Rhythm: Normal rate and regular rhythm.     Pulses: Normal pulses.     Heart sounds: Normal heart sounds. No murmur.  Pulmonary:     Effort: Pulmonary effort is normal. No respiratory distress.     Breath sounds: Normal breath sounds. No wheezing, rhonchi or rales.  Musculoskeletal:     Right lower leg: No edema.     Left lower leg: No edema.  Neurological:     Mental Status: She is alert.  Psychiatric:        Mood and Affect: Mood normal.       Results for orders placed or performed in visit on  09/22/19  POCT glycosylated hemoglobin (Hb A1C)  Result Value Ref Range   Hemoglobin A1C 5.5 4.0 - 5.6 %   HbA1c POC (<> result, manual entry)     HbA1c, POC (prediabetic range)     HbA1c, POC (controlled diabetic range)     Lab Results  Component Value Date   HGBA1C 5.5 09/22/2019    Assessment & Plan:  This visit occurred during the SARS-CoV-2 public health emergency.  Safety protocols were in place, including screening questions prior to the visit, additional usage of staff PPE, and extensive cleaning of exam room while observing appropriate contact time as indicated for disinfecting solutions.   Problem List Items Addressed This Visit    Systolic murmur    Not appreciated today.       Prediabetes - Primary    Update A1c. Congratulated on weight loss to date.       Relevant Orders   POCT glycosylated hemoglobin (Hb A1C) (Completed)   Overweight (BMI 25.0-29.9)    Congratulated on weight loss to date - total 40 lbs over the past 2 years.      Nonischemic cardiomyopathy ([place])    Appreciate. cardiology care      Familial combined hyperlipidemia    Appreciate lipid clinic care. Now on praluent through them. Latest LDL 107.           Meds ordered this encounter  Medications  . clonazePAM (KLONOPIN) 1 MG tablet    Sig: Take 1 tablet (1 mg total) by mouth 2 (two) times daily as needed.    Dispense:  60 tablet    Refill:  0   Orders Placed This Encounter  Procedures  . POCT glycosylated hemoglobin (Hb A1C)    Patient Instructions  You are doing well today Klonopin refilled today A1c checked today.  Return as needed or in 6 months for physical/wellness visit   Follow up plan: Return in about 6 months (around 03/24/2020) for annual exam, prior fasting for blood work, medicare wellness visit.  Ria Bush, MD

## 2019-09-22 NOTE — Assessment & Plan Note (Signed)
Congratulated on weight loss to date - total 40 lbs over the past 2 years.

## 2019-09-22 NOTE — Assessment & Plan Note (Signed)
Update A1c. Congratulated on weight loss to date.

## 2019-10-02 ENCOUNTER — Other Ambulatory Visit: Payer: Self-pay | Admitting: Internal Medicine

## 2019-10-30 ENCOUNTER — Other Ambulatory Visit: Payer: Self-pay | Admitting: Family Medicine

## 2019-10-30 DIAGNOSIS — Z1231 Encounter for screening mammogram for malignant neoplasm of breast: Secondary | ICD-10-CM

## 2019-11-26 ENCOUNTER — Other Ambulatory Visit: Payer: Self-pay | Admitting: Family Medicine

## 2019-11-27 NOTE — Telephone Encounter (Signed)
Name of Medication: Clonazepam Name of Pharmacy: [facility] or Written Date and Quantity: 09/22/19, #60 Last Office Visit and Type: 09/22/19, 6 mo f/u Next Office Visit and Type: 03/30/20, AWV prt 2 Last Controlled Substance Agreement Date: 03/16/15 Last UDS: 03/16/15

## 2019-11-27 NOTE — Telephone Encounter (Signed)
ERx 

## 2019-12-11 ENCOUNTER — Ambulatory Visit
Admission: RE | Admit: 2019-12-11 | Discharge: 2019-12-11 | Disposition: A | Discharge status: Home / Self Care | Payer: Medicare HMO | Source: Ambulatory Visit | Attending: Family Medicine | Admitting: Family Medicine

## 2019-12-11 ENCOUNTER — Other Ambulatory Visit: Payer: Self-pay

## 2019-12-11 DIAGNOSIS — Z1231 Encounter for screening mammogram for malignant neoplasm of breast: Secondary | ICD-10-CM | POA: Diagnosis not present

## 2019-12-11 LAB — HM MAMMOGRAPHY

## 2019-12-15 ENCOUNTER — Encounter: Payer: Self-pay | Admitting: Family Medicine

## 2020-01-31 ENCOUNTER — Other Ambulatory Visit: Payer: Self-pay | Admitting: Family Medicine

## 2020-02-01 NOTE — Telephone Encounter (Signed)
Name of Medication: Clonazepam Name of Pharmacy: [facility] or Written Date and Quantity: 11/27/19, #60 Last Office Visit and Type: 09/22/19, 6 mo f/u Next Office Visit and Type: 03/30/20, AWV prt 2 Last Controlled Substance Agreement Date: 03/16/15 Last UDS: 03/16/15

## 2020-02-02 NOTE — Telephone Encounter (Signed)
ERx 

## 2020-02-22 ENCOUNTER — Ambulatory Visit (INDEPENDENT_AMBULATORY_CARE_PROVIDER_SITE_OTHER): Payer: Medicare HMO | Admitting: Family Medicine

## 2020-02-22 ENCOUNTER — Encounter: Payer: Self-pay | Admitting: Family Medicine

## 2020-02-22 ENCOUNTER — Other Ambulatory Visit: Payer: Self-pay

## 2020-02-22 VITALS — BP 106/72 | HR 88 | Temp 97.8°F | Ht 61.0 in | Wt 140.5 lb

## 2020-02-22 DIAGNOSIS — M170 Bilateral primary osteoarthritis of knee: Secondary | ICD-10-CM

## 2020-02-22 MED ORDER — METHYLPREDNISOLONE ACETATE 40 MG/ML IJ SUSP
80.0000 mg | Freq: Once | INTRAMUSCULAR | Status: AC
  Administered 2020-02-22: 80 mg via INTRA_ARTICULAR

## 2020-02-22 NOTE — Progress Notes (Signed)
    Kylar Speelman T. Keitra Carusone, MD, [place]  Primary Care and Sports Medicine [facility] at [facility] [place], [address]  Phone: [phone]  FAX: [place] - 74 y.o. female  MRN [id number]  Date of Birth: [birth year]  Date: 02/22/2020  PCP: Ria Bush, MD  Referral: Ria Bush, MD  Chief Complaint  Patient presents with  . Knee Pain    Bilateral-right is worse    This visit occurred during the SARS-CoV-2 public health emergency.  Safety protocols were in place, including screening questions prior to the visit, additional usage of staff PPE, and extensive cleaning of exam room while observing appropriate contact time as indicated for disinfecting solutions.   Subjective:   Ann White is a 74 y.o. very pleasant female patient with Body mass index is 26.55 kg/m. who presents with the following:  B knee OA. Known. Medial > lateral joint line tenderness B  Procedure only:  Aspiration/Injection Procedure Note Ann White [birth year] Date of procedure: 02/22/2020  Procedure: Large Joint Joint Aspiration / Injection of the Right Knee Indications: Pain  Procedure Details Patient verbally consented to procedure. Risks (including potential rare risk of infection), benefits, and alternatives explained. Sterilely prepped with Chloraprep. Ethyl cholride used for anesthesia. 8 cc Lidocaine 1% mixed with 2 mL Depo-Medrol 40 mg injected using the anteromedial approach without difficulty. No complications with procedure and tolerated well. Patient had decreased pain post-injection.  Medication: 2 mL of Depo-Medrol 40 mg, equaling Depo-Medrol 80 mg total  Aspiration/Injection Procedure Note Ann White 03/08/1946 Date of procedure: 02/22/2020  Procedure: Large Joint Aspiration / Injection of the Left Knee Indications: Pain  Procedure Details Patient verbally consented to procedure.  Risks (including potential rare risk of infection), benefits, and alternatives explained. Sterilely prepped with Chloraprep. Ethyl cholride used for anesthesia. 8 cc Lidocaine 1% mixed with 2 mL Depo-Medrol 40 mg injected using the anteromedial approach without difficulty. No complications with procedure and tolerated well. Patient had decreased pain post-injection.  Medication: 2 mL of Depo-Medrol 40 mg, equaling Depo-Medrol 80 mg total    ICD-10-CM   1. Primary osteoarthritis of knees, bilateral  M17.0 methylPREDNISolone acetate (DEPO-MEDROL) injection 80 mg    methylPREDNISolone acetate (DEPO-MEDROL) injection 80 mg    Follow-up: No follow-ups on file.  Meds ordered this encounter  Medications  . methylPREDNISolone acetate (DEPO-MEDROL) injection 80 mg  . methylPREDNISolone acetate (DEPO-MEDROL) injection 80 mg   Signed,  Pricilla Moehle T. Jazzalynn Rhudy, MD

## 2020-03-24 ENCOUNTER — Other Ambulatory Visit: Payer: Self-pay | Admitting: Family Medicine

## 2020-03-24 DIAGNOSIS — N183 Chronic kidney disease, stage 3 unspecified: Secondary | ICD-10-CM

## 2020-03-24 DIAGNOSIS — R7303 Prediabetes: Secondary | ICD-10-CM

## 2020-03-24 DIAGNOSIS — E7849 Other hyperlipidemia: Secondary | ICD-10-CM

## 2020-03-25 ENCOUNTER — Ambulatory Visit (INDEPENDENT_AMBULATORY_CARE_PROVIDER_SITE_OTHER): Payer: Medicare HMO

## 2020-03-25 ENCOUNTER — Other Ambulatory Visit (INDEPENDENT_AMBULATORY_CARE_PROVIDER_SITE_OTHER): Payer: Medicare HMO

## 2020-03-25 ENCOUNTER — Other Ambulatory Visit: Payer: Self-pay

## 2020-03-25 DIAGNOSIS — E7849 Other hyperlipidemia: Secondary | ICD-10-CM | POA: Diagnosis not present

## 2020-03-25 DIAGNOSIS — R7303 Prediabetes: Secondary | ICD-10-CM

## 2020-03-25 DIAGNOSIS — Z0000 Encounter for general adult medical examination without abnormal findings: Secondary | ICD-10-CM

## 2020-03-25 DIAGNOSIS — N183 Chronic kidney disease, stage 3 unspecified: Secondary | ICD-10-CM | POA: Diagnosis not present

## 2020-03-25 LAB — LIPID PANEL
Cholesterol: 150 mg/dL (ref 0–200)
HDL: 47.3 mg/dL
LDL Cholesterol: 65 mg/dL (ref 0–99)
NonHDL: 102.9
Total CHOL/HDL Ratio: 3
Triglycerides: 191 mg/dL — ABNORMAL HIGH (ref 0.0–149.0)
VLDL: 38.2 mg/dL (ref 0.0–40.0)

## 2020-03-25 LAB — CBC WITH DIFFERENTIAL/PLATELET
Basophils Absolute: 0 10*3/uL (ref 0.0–0.1)
Basophils Relative: 0.7 % (ref 0.0–3.0)
Eosinophils Absolute: 0.2 10*3/uL (ref 0.0–0.7)
Eosinophils Relative: 2.8 % (ref 0.0–5.0)
HCT: 32.9 % — ABNORMAL LOW (ref 36.0–46.0)
Hemoglobin: 10.8 g/dL — ABNORMAL LOW (ref 12.0–15.0)
Lymphocytes Relative: 22.9 % (ref 12.0–46.0)
Lymphs Abs: 1.6 10*3/uL (ref 0.7–4.0)
MCHC: 33 g/dL (ref 30.0–36.0)
MCV: 88.3 fl (ref 78.0–100.0)
Monocytes Absolute: 0.2 10*3/uL (ref 0.1–1.0)
Monocytes Relative: 3.5 % (ref 3.0–12.0)
Neutro Abs: 4.8 10*3/uL (ref 1.4–7.7)
Neutrophils Relative %: 70.1 % (ref 43.0–77.0)
Platelets: 251 10*3/uL (ref 150.0–400.0)
RBC: 3.72 Mil/uL — ABNORMAL LOW (ref 3.87–5.11)
RDW: 14.6 % (ref 11.5–15.5)
WBC: 6.8 10*3/uL (ref 4.0–10.5)

## 2020-03-25 LAB — COMPREHENSIVE METABOLIC PANEL
ALT: 10 U/L (ref 0–35)
AST: 21 U/L (ref 0–37)
Albumin: 4.5 g/dL (ref 3.5–5.2)
Alkaline Phosphatase: 39 U/L (ref 39–117)
BUN: 32 mg/dL — ABNORMAL HIGH (ref 6–23)
CO2: 28 mEq/L (ref 19–32)
Calcium: 9.9 mg/dL (ref 8.4–10.5)
Chloride: 104 mEq/L (ref 96–112)
Creatinine, Ser: 1.47 mg/dL — ABNORMAL HIGH (ref 0.40–1.20)
GFR: 34.85 mL/min — ABNORMAL LOW (ref >60.00)
Glucose, Bld: 71 mg/dL (ref 70–99)
Potassium: 4.8 mEq/L (ref 3.5–5.1)
Sodium: 139 mEq/L (ref 135–145)
Total Bilirubin: 0.4 mg/dL (ref 0.2–1.2)
Total Protein: 7.6 g/dL (ref 6.0–8.3)

## 2020-03-25 LAB — VITAMIN D 25 HYDROXY (VIT D DEFICIENCY, FRACTURES): VITD: 95.4 ng/mL (ref 30.00–100.00)

## 2020-03-25 LAB — HEMOGLOBIN A1C: Hgb A1c MFr Bld: 6.3 % (ref 4.6–6.5)

## 2020-03-25 NOTE — Progress Notes (Signed)
Subjective:   Ann White is a 74 y.o. female who presents for Medicare Annual (Subsequent) preventive examination.  Review of Systems: N/A     I connected with the patient today by telephone and verified that I am speaking with the correct person using two identifiers. Location patient: home Location nurse: work Persons participating in the telephone visit: patient, nurse.   I discussed the limitations, risks, security and privacy concerns of performing an evaluation and management service by telephone and the availability of in person appointments. I also discussed with the patient that there may be a patient responsible charge related to this service. The patient expressed understanding and verbally consented to this telephonic visit.        Cardiac Risk Factors include: advanced age (>47men, >23 women);hypertension;dyslipidemia     Objective:    Today's Vitals   There is no height or weight on file to calculate BMI.  Advanced Directives 03/25/2020 03/06/2018 02/26/2017 02/21/2016  Does Patient Have a Medical Advance Directive? Yes Yes Yes Yes  Type of Paramedic of [place];Living will [place];Living will [place];Living will [place];Living will  Copy of [place] in Chart? No - copy requested No - copy requested No - copy requested No - copy requested    Current Medications (verified) Outpatient Encounter Medications as of 03/25/2020  Medication Sig  . Acetaminophen (ARTHRITIS PAIN RELIEF PO) Take by mouth.  . Cholecalciferol (VITAMIN D3) 1000 UNITS CAPS Take 1 capsule (1,000 Units total) by mouth daily.  . clonazePAM (KLONOPIN) 1 MG tablet Take 1 tablet by mouth twice daily as needed  . Docusate Calcium (STOOL SOFTENER PO) Take by mouth daily.   . Evolocumab with Infusor ([place]) 420 MG/3.5ML SOCT Inject 420 mg into the skin every 30 (thirty) days.   Marland Kitchen gabapentin (NEURONTIN) 300 MG capsule TAKE ONE CAPSULE EVERY MORNING AND 2 CAPSULES AT BEDTIME  . lisinopril (ZESTRIL) 2.5 MG tablet Take 1 tablet by mouth once daily  . MAGNESIUM CITRATE PO Take by mouth.  . metoprolol succinate (TOPROL-XL) 25 MG 24 hr tablet Take 1/2 (one-half) tablet by mouth once daily  . Misc Natural Products (SINUS FORMULA PO) Take by mouth.  . Multiple Vitamin (MULTIVITAMIN) tablet Take 1 tablet by mouth daily.  . pantoprazole (PROTONIX) 40 MG tablet Take 1 tablet by mouth once daily  . sertraline (ZOLOFT) 100 MG tablet Take 1 tablet (100 mg total) by mouth daily.  . traMADol (ULTRAM) 50 MG tablet Take 1 tablet by mouth three times daily as needed  . vitamin B-12 (CYANOCOBALAMIN) 500 MCG tablet Take 500 mcg by mouth every other day.   No facility-administered encounter medications on file as of 03/25/2020.    Allergies (verified) Sulfa antibiotics, Nexletol [bempedoic acid], Tricor [fenofibrate], Zetia [ezetimibe], Contrast media [iodinated diagnostic agents], Lipitor [atorvastatin], and Pravastatin   History: Past Medical History:  Diagnosis Date  . Allergy   . Anxiety   . Arthritis   . Cardiomyopathy ([place])    a. 04/2017 Echo: EF 35-40%, diff HK, mild MR, mildly dil LA, nl RV fxn.  . Carotid stenosis    a. Bilateral 40-59%, rpt 1 yr (02/2016); b. 03/2017 Carotid U/S: RICA 19-62%, LICA 2-29%, NLGX>21% - overall stable.  . Cervical spondylosis    s/p spine injections  . Depression   . Ectatic abdominal aorta ([place]) 2016   rpt [place] 5 yrs  . GERD (gastroesophageal reflux disease)   .  History of chicken pox   . History of diverticulitis of colon   . HLD (hyperlipidemia)   . HTN (hypertension)   . Irritable bowel syndrome with constipation   . Lichen sclerosus et atrophicus   . Obesity   . Premature atrial contraction    a. 04/2017 48hr Holter: Predominant rhythm - sinus. Rare PVC's, freq PAC's with freq atrial runs up to 34 beats-->metoprolol started.  .  Seasonal allergic rhinitis    Past Surgical History:  Procedure Laterality Date  . bladder tack  1990s  . BREAST BIOPSY Right 03/05/2012    benign  . CARPAL TUNNEL RELEASE  2004, 2013   bilateral  . CHOLECYSTECTOMY  1990s  . COLONOSCOPY  2002  . COLONOSCOPY  06/2013   mod diverticulosis, 3 tubular adenomas, int hem rpt 5 yrs [facility])  . COLONOSCOPY  06/2018   TA,c olonic angiodysplastic lesion, mod diverticulosis, rpt 5 yrs [facility])  . DEXA  07/2011   normal, T score -0.3  . ESOPHAGOGASTRODUODENOSCOPY ENDOSCOPY  2004   normal [facility])  . TOTAL ABDOMINAL HYSTERECTOMY W/ BILATERAL SALPINGOOPHORECTOMY  1990s   complete, dysmenorrhea and fibroids   Family History  Problem Relation Age of Onset  . CAD Mother 25       MI  . Hypertension Mother   . Parkinson's disease Father   . Diabetes Father   . CAD Father 66       MI  . Hyperlipidemia Sister   . Cancer Neg Hx   . Stroke Neg Hx   . Colon cancer Neg Hx   . Breast cancer Neg Hx   . Colon polyps Neg Hx   . Esophageal cancer Neg Hx   . Stomach cancer Neg Hx   . Rectal cancer Neg Hx    Social History   Socioeconomic History  . Marital status: Married    Spouse name: Not on file  . Number of children: Not on file  . Years of education: Not on file  . Highest education level: Not on file  Occupational History  . Not on file  Tobacco Use  . Smoking status: Former Smoker    Packs/day: 0.25    Years: 3.00    Pack years: 0.75    Types: Cigarettes    Quit date: 1990    Years since quitting: 31.7  . Smokeless tobacco: Never Used  . Tobacco comment: minimal smoking history  Vaping Use  . Vaping Use: Never used  Substance and Sexual Activity  . Alcohol use: No    Alcohol/week: 0.0 standard drinks  . Drug use: No  . Sexual activity: Never  Other Topics Concern  . Not on file  Social History Narrative   Lives with husband and 2 cats.  2 grown children, 5 grandchildren.   Occupation: retired, worked in [employer]   Activity: no  regular exercise.  Does walk in summer   Diet: fruits/vegetables daily, good water.   Social Determinants of Health   Financial Resource Strain: Low Risk   . Difficulty of Paying Living Expenses: Not hard at all  Food Insecurity: No Food Insecurity  . Worried About Charity fundraiser in the Last Year: Never true  . Ran Out of Food in the Last Year: Never true  Transportation Needs: No Transportation Needs  . Lack of Transportation (Medical): No  . Lack of Transportation (Non-Medical): No  Physical Activity: Inactive  . Days of Exercise per Week: 0 days  . Minutes of Exercise per Session:  0 min  Stress: No Stress Concern Present  . Feeling of Stress : Not at all  Social Connections:   . Frequency of Communication with Friends and Family: Not on file  . Frequency of Social Gatherings with Friends and Family: Not on file  . Attends Religious Services: Not on file  . Active Member of Clubs or Organizations: Not on file  . Attends Archivist Meetings: Not on file  . Marital Status: Not on file    Tobacco Counseling Counseling given: Not Answered Comment: minimal smoking history   Clinical Intake:  Pre-visit preparation completed: Yes  Pain : No/denies pain     Nutritional Risks: None Diabetes: No  How often do you need to have someone help you when you read instructions, pamphlets, or other written materials from your doctor or pharmacy?: 1 - Never What is the last grade level you completed in school?: 12th  Diabetic: No Nutrition Risk Assessment:  Has the patient had any N/V/D within the last 2 months?  No  Does the patient have any non-healing wounds?  No  Has the patient had any unintentional weight loss or weight gain?  No   Diabetes:  Is the patient diabetic?  No  If diabetic, was a CBG obtained today?  N/A Did the patient bring in their glucometer from home?  N/A How often do you monitor your CBG's? N/A.   Financial Strains and Diabetes  Management:  Are you having any financial strains with the device, your supplies or your medication? N/A.  Does the patient want to be seen by Chronic Care Management for management of their diabetes?  N/A Would the patient like to be referred to a Nutritionist or for Diabetic Management?  N/A   Interpreter Needed?: No  Information entered by :: CJohnson, LPN   Activities of Daily Living In your present state of health, do you have any difficulty performing the following activities: 03/25/2020  Hearing? N  Vision? N  Difficulty concentrating or making decisions? N  Walking or climbing stairs? N  Dressing or bathing? N  Doing errands, shopping? N  Preparing Food and eating ? N  Using the Toilet? N  In the past six months, have you accidently leaked urine? N  Do you have problems with loss of bowel control? N  Managing your Medications? N  Managing your Finances? N  Housekeeping or managing your Housekeeping? N  Some recent data might be hidden    Patient Care Team: Ria Bush, MD as PCP - General (Family Medicine) Jovita Gamma, MD as Consulting Physician (Neurosurgery) Neil Crouch, OD as Referring Physician (Optometry)  Indicate any recent Medical Services you may have received from other than [facility] providers in the past year (date may be approximate).     Assessment:   This is a routine wellness examination for [facility].  Hearing/Vision screen  Hearing Screening   125Hz  250Hz  500Hz  1000Hz  2000Hz  3000Hz  4000Hz  6000Hz  8000Hz   Right ear:           Left ear:           Vision Screening Comments: Patient gets annual eye exams  Dietary issues and exercise activities discussed: Current Exercise Habits: The patient does not participate in regular exercise at present (stays doing yard work), Exercise limited by: None identified  Goals    . DIET - INCREASE WATER INTAKE     Starting 03/06/2018, I will attempt to drink at least 6-8 glasses of water daily.     Marland Kitchen  Patient Stated     03/25/2020, I will maintain and continue medications as prescribed.       Depression Screen PHQ 2/9 Scores 03/25/2020 03/25/2019 03/06/2018 02/26/2017 02/21/2016 03/16/2015 08/28/2013  PHQ - 2 Score 0 0 0 0 0 0 0  PHQ- 9 Score 0 1 0 1 - - -    Fall Risk Fall Risk  03/25/2020 03/06/2018 02/26/2017 02/21/2016 03/16/2015  Falls in the past year? 0 No No No No  Number falls in past yr: 0 - - - -  Injury with Fall? 0 - - - -  Risk for fall due to : Medication side effect - - - -  Follow up Falls evaluation completed;Falls prevention discussed - - - -    Any stairs in or around the home? Yes  If so, are there any without handrails? No  Home free of loose throw rugs in walkways, pet beds, electrical cords, etc? Yes  Adequate lighting in your home to reduce risk of falls? Yes   ASSISTIVE DEVICES UTILIZED TO PREVENT FALLS:  Life alert? No  Use of a cane, walker or w/c? No  Grab bars in the bathroom? No  Shower chair or bench in shower? No  Elevated toilet seat or a handicapped toilet? No   TIMED UP AND GO:  Was the test performed? N/A, telephonic visit .   Cognitive Function: MMSE - Mini Mental State Exam 03/25/2020 03/06/2018 02/26/2017 02/21/2016  Orientation to time 5 5 5 5   Orientation to Place 5 5 5 5   Registration 3 3 3 3   Attention/ Calculation 5 0 0 0  Recall 3 3 3 3   Language- name 2 objects - 0 0 0  Language- repeat 1 1 1 1   Language- follow 3 step command - 3 3 3   Language- read & follow direction - 0 0 0  Write a sentence - 0 0 0  Copy design - 0 0 0  Total score - 20 20 20   Mini Cog  Mini-Cog screen was completed. Maximum score is 22. A value of 0 denotes this part of the MMSE was not completed or the patient failed this part of the Mini-Cog screening.       Immunizations Immunization History  Administered Date(s) Administered  . Fluad Quad(high Dose 65+) 03/25/2019  . Influenza, High Dose Seasonal PF 04/10/2018  . Influenza,inj,Quad PF,6+ Mos  03/16/2015  . Influenza-Unspecified 04/15/2013, 04/15/2014, 04/16/2016, 04/15/2017  . PFIZER SARS-COV-2 Vaccination 08/02/2019, 08/22/2019  . Pneumococcal Conjugate-13 08/28/2013  . Pneumococcal Polysaccharide-23 08/27/2012  . Tdap 08/27/2012  . Zoster 07/16/2010    TDAP status: Up to date Flu Vaccine status: due, will get at upcoming physical Pneumococcal vaccine status: Up to date Covid-19 vaccine status: Completed vaccines  Qualifies for Shingles Vaccine? Yes   Zostavax completed Yes   Shingrix Completed?: No.    Education has been provided regarding the importance of this vaccine. Patient has been advised to call insurance company to determine out of pocket expense if they have not yet received this vaccine. Advised may also receive vaccine at local pharmacy or Health Dept. Verbalized acceptance and understanding.  Screening Tests Health Maintenance  Topic Date Due  . DTAP VACCINES (1) 06/26/1946  . INFLUENZA VACCINE  02/14/2020  . MAMMOGRAM  12/10/2020  . DTaP/Tdap/Td (2 - Td or Tdap) 08/27/2022  . TETANUS/TDAP  08/27/2022  . COLONOSCOPY  06/19/2023  . DEXA SCAN  Completed  . COVID-19 Vaccine  Completed  . Hepatitis C Screening  Completed  .  PNA vac Low Risk Adult  Completed    Health Maintenance  Health Maintenance Due  Topic Date Due  . DTAP VACCINES (1) 06/26/1946  . INFLUENZA VACCINE  02/14/2020    Colorectal cancer screening: Completed 06/28/2018. Repeat every 5 years Mammogram status: Completed 12/11/2019. Repeat every year Bone Density status: due, will complete at next mammogram  Lung Cancer Screening: (Low Dose CT Chest recommended if Age 50-80 years, 30 pack-year currently smoking OR have quit w/in 15years.) does not qualify.    Additional Screening:  Hepatitis C Screening: does qualify; Completed 02/21/2016  Vision Screening: Recommended annual ophthalmology exams for early detection of glaucoma and other disorders of the eye. Is the patient up to  date with their annual eye exam?  Yes  Who is the provider or what is the name of the office in which the patient attends annual eye exams? Dr. Renaldo Fiddler  If pt is not established with a provider, would they like to be referred to a provider to establish care? No .   Dental Screening: Recommended annual dental exams for proper oral hygiene  Community Resource Referral / Chronic Care Management: CRR required this visit?  No   CCM required this visit?  No      Plan:     I have personally reviewed and noted the following in the patient's chart:   . Medical and social history . Use of alcohol, tobacco or illicit drugs  . Current medications and supplements . Functional ability and status . Nutritional status . Physical activity . Advanced directives . List of other physicians . Hospitalizations, surgeries, and ER visits in previous 12 months . Vitals . Screenings to include cognitive, depression, and falls . Referrals and appointments  In addition, I have reviewed and discussed with patient certain preventive protocols, quality metrics, and best practice recommendations. A written personalized care plan for preventive services as well as general preventive health recommendations were provided to patient.   Due to this being a telephonic visit, the after visit summary with patients personalized plan was offered to patient via mail or my-chart. Patient preferred to pick up at office at next visit.   Andrez Grime, LPN   10/22/1446

## 2020-03-25 NOTE — Progress Notes (Signed)
PCP notes:  Health Maintenance: Flu- due Shingrix- due Dexa- due, will complete along with next mammogram    Abnormal Screenings: none   Patient concerns: Bruising easily   Nurse concerns: none   Next PCP appt.: 03/30/2020 @ 12 pm

## 2020-03-25 NOTE — Patient Instructions (Signed)
Ann White , Thank you for taking time to come for your Medicare Wellness Visit. I appreciate your ongoing commitment to your health goals. Please review the following plan we discussed and let me know if I can assist you in the future.   Screening recommendations/referrals: Colonoscopy: Up to date, completed 06/28/2018, due 06/2023 Mammogram: Up to date, completed 12/11/2019, due 11/2020 Bone Density: due, will get at next mammogram  Recommended yearly ophthalmology/optometry visit for glaucoma screening and checkup Recommended yearly dental visit for hygiene and checkup  Vaccinations: Influenza vaccine: due, will complete at upcoming physical Pneumococcal vaccine: Completed series Tdap vaccine: Up to date, completed 08/27/2012, due 08/2022 Shingles vaccine: due, check with your insurance regarding coverage if interested   Covid-19:Completed series  Advanced directives: Please bring a copy of your POA (Power of [place]) and/or Living Will to your next appointment.   Conditions/risks identified: hypertension, hyperlipidemia  Next appointment: Follow up in one year for your annual wellness visit    Preventive Care 74 Years and Older, Female Preventive care refers to lifestyle choices and visits with your health care provider that can promote health and wellness. What does preventive care include?  A yearly physical exam. This is also called an annual well check.  Dental exams once or twice a year.  Routine eye exams. Ask your health care provider how often you should have your eyes checked.  Personal lifestyle choices, including:  Daily care of your teeth and gums.  Regular physical activity.  Eating a healthy diet.  Avoiding tobacco and drug use.  Limiting alcohol use.  Practicing safe sex.  Taking low-dose aspirin every day.  Taking vitamin and mineral supplements as recommended by your health care provider. What happens during an annual well check? The services and  screenings done by your health care provider during your annual well check will depend on your age, overall health, lifestyle risk factors, and family history of disease. Counseling  Your health care provider may ask you questions about your:  Alcohol use.  Tobacco use.  Drug use.  Emotional well-being.  Home and relationship well-being.  Sexual activity.  Eating habits.  History of falls.  Memory and ability to understand (cognition).  Work and work Statistician.  Reproductive health. Screening  You may have the following tests or measurements:  Height, weight, and BMI.  Blood pressure.  Lipid and cholesterol levels. These may be checked every 5 years, or more frequently if you are over 61 years old.  Skin check.  Lung cancer screening. You may have this screening every year starting at age 45 if you have a 30-pack-year history of smoking and currently smoke or have quit within the past 15 years.  Fecal occult blood test (FOBT) of the stool. You may have this test every year starting at age 84.  Flexible sigmoidoscopy or colonoscopy. You may have a sigmoidoscopy every 5 years or a colonoscopy every 10 years starting at age 43.  Hepatitis C blood test.  Hepatitis B blood test.  Sexually transmitted disease (STD) testing.  Diabetes screening. This is done by checking your blood sugar (glucose) after you have not eaten for a while (fasting). You may have this done every 1-3 years.  Bone density scan. This is done to screen for osteoporosis. You may have this done starting at age 61.  Mammogram. This may be done every 1-2 years. Talk to your health care provider about how often you should have regular mammograms. Talk with your health care provider about  your test results, treatment options, and if necessary, the need for more tests. Vaccines  Your health care provider may recommend certain vaccines, such as:  Influenza vaccine. This is recommended every year.   Tetanus, diphtheria, and acellular pertussis (Tdap, Td) vaccine. You may need a Td booster every 10 years.  Zoster vaccine. You may need this after age 41.  Pneumococcal 13-valent conjugate (PCV13) vaccine. One dose is recommended after age 103.  Pneumococcal polysaccharide (PPSV23) vaccine. One dose is recommended after age 26. Talk to your health care provider about which screenings and vaccines you need and how often you need them. This information is not intended to replace advice given to you by your health care provider. Make sure you discuss any questions you have with your health care provider. Document Released: 07/29/2015 Document Revised: 03/21/2016 Document Reviewed: 05/03/2015 Elsevier Interactive Patient Education  2017 [place] Prevention in the Home Falls can cause injuries. They can happen to people of all ages. There are many things you can do to make your home safe and to help prevent falls. What can I do on the outside of my home?  Regularly fix the edges of walkways and driveways and fix any cracks.  Remove anything that might make you trip as you walk through a door, such as a raised step or threshold.  Trim any bushes or trees on the path to your home.  Use bright outdoor lighting.  Clear any walking paths of anything that might make someone trip, such as rocks or tools.  Regularly check to see if handrails are loose or broken. Make sure that both sides of any steps have handrails.  Any raised decks and porches should have guardrails on the edges.  Have any leaves, snow, or ice cleared regularly.  Use sand or salt on walking paths during winter.  Clean up any spills in your garage right away. This includes oil or grease spills. What can I do in the bathroom?  Use night lights.  Install grab bars by the toilet and in the tub and shower. Do not use towel bars as grab bars.  Use non-skid mats or decals in the tub or shower.  If you need to sit  down in the shower, use a plastic, non-slip stool.  Keep the floor dry. Clean up any water that spills on the floor as soon as it happens.  Remove soap buildup in the tub or shower regularly.  Attach bath mats securely with double-sided non-slip rug tape.  Do not have throw rugs and other things on the floor that can make you trip. What can I do in the bedroom?  Use night lights.  Make sure that you have a light by your bed that is easy to reach.  Do not use any sheets or blankets that are too big for your bed. They should not hang down onto the floor.  Have a firm chair that has side arms. You can use this for support while you get dressed.  Do not have throw rugs and other things on the floor that can make you trip. What can I do in the kitchen?  Clean up any spills right away.  Avoid walking on wet floors.  Keep items that you use a lot in easy-to-reach places.  If you need to reach something above you, use a strong step stool that has a grab bar.  Keep electrical cords out of the way.  Do not use floor polish or wax  that makes floors slippery. If you must use wax, use non-skid floor wax.  Do not have throw rugs and other things on the floor that can make you trip. What can I do with my stairs?  Do not leave any items on the stairs.  Make sure that there are handrails on both sides of the stairs and use them. Fix handrails that are broken or loose. Make sure that handrails are as long as the stairways.  Check any carpeting to make sure that it is firmly attached to the stairs. Fix any carpet that is loose or worn.  Avoid having throw rugs at the top or bottom of the stairs. If you do have throw rugs, attach them to the floor with carpet tape.  Make sure that you have a light switch at the top of the stairs and the bottom of the stairs. If you do not have them, ask someone to add them for you. What else can I do to help prevent falls?  Wear shoes that:  Do not have  high heels.  Have rubber bottoms.  Are comfortable and fit you well.  Are closed at the toe. Do not wear sandals.  If you use a stepladder:  Make sure that it is fully opened. Do not climb a closed stepladder.  Make sure that both sides of the stepladder are locked into place.  Ask someone to hold it for you, if possible.  Clearly mark and make sure that you can see:  Any grab bars or handrails.  First and last steps.  Where the edge of each step is.  Use tools that help you move around (mobility aids) if they are needed. These include:  Canes.  Walkers.  Scooters.  Crutches.  Turn on the lights when you go into a dark area. Replace any light bulbs as soon as they burn out.  Set up your furniture so you have a clear path. Avoid moving your furniture around.  If any of your floors are uneven, fix them.  If there are any pets around you, be aware of where they are.  Review your medicines with your doctor. Some medicines can make you feel dizzy. This can increase your chance of falling. Ask your doctor what other things that you can do to help prevent falls. This information is not intended to replace advice given to you by your health care provider. Make sure you discuss any questions you have with your health care provider. Document Released: 04/28/2009 Document Revised: 12/08/2015 Document Reviewed: 08/06/2014 Elsevier Interactive Patient Education  2017 [employer].

## 2020-03-28 ENCOUNTER — Other Ambulatory Visit: Payer: Self-pay | Admitting: Family Medicine

## 2020-03-28 LAB — PARATHYROID HORMONE, INTACT (NO CA): PTH: 16 pg/mL (ref 14–64)

## 2020-03-30 ENCOUNTER — Encounter: Payer: Self-pay | Admitting: Family Medicine

## 2020-03-30 ENCOUNTER — Other Ambulatory Visit: Payer: Self-pay

## 2020-03-30 ENCOUNTER — Ambulatory Visit (INDEPENDENT_AMBULATORY_CARE_PROVIDER_SITE_OTHER): Payer: Medicare HMO | Admitting: Family Medicine

## 2020-03-30 VITALS — BP 118/68 | HR 89 | Temp 98.0°F | Ht 61.0 in | Wt 137.6 lb

## 2020-03-30 DIAGNOSIS — Z0001 Encounter for general adult medical examination with abnormal findings: Secondary | ICD-10-CM

## 2020-03-30 DIAGNOSIS — D631 Anemia in chronic kidney disease: Secondary | ICD-10-CM | POA: Diagnosis not present

## 2020-03-30 DIAGNOSIS — N1832 Chronic kidney disease, stage 3b: Secondary | ICD-10-CM | POA: Diagnosis not present

## 2020-03-30 DIAGNOSIS — R7303 Prediabetes: Secondary | ICD-10-CM | POA: Diagnosis not present

## 2020-03-30 DIAGNOSIS — K219 Gastro-esophageal reflux disease without esophagitis: Secondary | ICD-10-CM

## 2020-03-30 DIAGNOSIS — I6523 Occlusion and stenosis of bilateral carotid arteries: Secondary | ICD-10-CM

## 2020-03-30 DIAGNOSIS — F5104 Psychophysiologic insomnia: Secondary | ICD-10-CM | POA: Insufficient documentation

## 2020-03-30 DIAGNOSIS — I499 Cardiac arrhythmia, unspecified: Secondary | ICD-10-CM

## 2020-03-30 DIAGNOSIS — I10 Essential (primary) hypertension: Secondary | ICD-10-CM | POA: Diagnosis not present

## 2020-03-30 DIAGNOSIS — Z7189 Other specified counseling: Secondary | ICD-10-CM

## 2020-03-30 DIAGNOSIS — Z23 Encounter for immunization: Secondary | ICD-10-CM

## 2020-03-30 DIAGNOSIS — I77811 Abdominal aortic ectasia: Secondary | ICD-10-CM

## 2020-03-30 DIAGNOSIS — I4819 Other persistent atrial fibrillation: Secondary | ICD-10-CM | POA: Insufficient documentation

## 2020-03-30 DIAGNOSIS — I4891 Unspecified atrial fibrillation: Secondary | ICD-10-CM

## 2020-03-30 DIAGNOSIS — D649 Anemia, unspecified: Secondary | ICD-10-CM | POA: Diagnosis not present

## 2020-03-30 DIAGNOSIS — R69 Illness, unspecified: Secondary | ICD-10-CM | POA: Diagnosis not present

## 2020-03-30 DIAGNOSIS — F331 Major depressive disorder, recurrent, moderate: Secondary | ICD-10-CM

## 2020-03-30 DIAGNOSIS — E7849 Other hyperlipidemia: Secondary | ICD-10-CM

## 2020-03-30 DIAGNOSIS — I428 Other cardiomyopathies: Secondary | ICD-10-CM

## 2020-03-30 LAB — POC URINALSYSI DIPSTICK (AUTOMATED)
Bilirubin, UA: NEGATIVE
Blood, UA: NEGATIVE
Glucose, UA: NEGATIVE
Ketones, UA: NEGATIVE
Leukocytes, UA: NEGATIVE
Nitrite, UA: NEGATIVE
Protein, UA: NEGATIVE
Spec Grav, UA: 1.025 (ref 1.010–1.025)
Urobilinogen, UA: 0.2 E.U./dL
pH, UA: 5.5 (ref 5.0–8.0)

## 2020-03-30 LAB — IBC PANEL
Iron: 70 ug/dL (ref 42–145)
Saturation Ratios: 20.8 % (ref 20.0–50.0)
Transferrin: 240 mg/dL (ref 212.0–360.0)

## 2020-03-30 LAB — FERRITIN: Ferritin: 181 ng/mL (ref 10.0–291.0)

## 2020-03-30 LAB — TSH: TSH: 1.4 u[IU]/mL (ref 0.35–4.50)

## 2020-03-30 MED ORDER — CLONAZEPAM 1 MG PO TABS
1.0000 mg | ORAL_TABLET | Freq: Every day | ORAL | 0 refills | Status: DC
Start: 2020-03-30 — End: 2020-05-11

## 2020-03-30 MED ORDER — APIXABAN 5 MG PO TABS: 5.0000 mg | ORAL_TABLET | Freq: Two times a day (BID) | ORAL | 3 refills | Status: DC

## 2020-03-30 NOTE — Assessment & Plan Note (Addendum)
Initial concern for atrial fibrillation by EKG and we discussed starting anticoagulation given increased stroke risk (she would not want coumadin). However reviewing chart, she has h/o abnormal EKGs in the past with ?frequent PACs/MAT vs wandering pacemaker s/p cardiology evaluation in the past.  For this reason I recommend cardiology evaluation prior to starting anticoagulant. Pt agrees with plan. Will see if we can expedite evaluation.  CHADSVASC2 score = 3 (age, gender, HTN).

## 2020-03-30 NOTE — Assessment & Plan Note (Signed)
Rpt ultrasound due - will discuss next visit.

## 2020-03-30 NOTE — Assessment & Plan Note (Signed)
Sleep initiation insomnia.  Likely related to depressed mood Longterm on klonopin 1mg  nightly.  Reviewed possible association of prolonged benzo use with memory deficits  Suggested start slow taper - she will try 1/2 tab at night time.

## 2020-03-30 NOTE — Assessment & Plan Note (Signed)
Preventative protocols reviewed and updated unless pt declined. Discussed healthy diet and lifestyle.  

## 2020-03-30 NOTE — Assessment & Plan Note (Signed)
Some deterioration noted over the past 6 months despite weight loss. Check urinalysis today. Consider renal imaging.

## 2020-03-30 NOTE — Progress Notes (Signed)
This visit was conducted in person.  BP 118/68 (BP Location: Left Arm, Patient Position: Sitting, Cuff Size: Normal)   Pulse 89   Temp 98 F (36.7 C) (Temporal)   Ht 5' 1"  (1.549 m)   Wt 137 lb 9 oz (62.4 kg)   SpO2 96%   BMI 25.99 kg/m    CC: CPE Subjective:    Patient ID: Ann White, female    DOB: 1946/04/20, 74 y.o.   MRN: [id number]  HPI: Ann White is a 74 y.o. female presenting on 03/30/2020 for Annual Exam (Prt 2.)   Saw health advisor last week for medicare wellness visit. Note reviewed.   No exam data present    Clinical Support from 03/25/2020 in [place] at [facility] Total Score 0      Fall Risk  03/25/2020 03/06/2018 02/26/2017 02/21/2016 03/16/2015  Falls in the past year? 0 No No No No  Number falls in past yr: 0 - - - -  Injury with Fall? 0 - - - -  Risk for fall due to : Medication side effect - - - -  Follow up Falls evaluation completed;Falls prevention discussed - - - -    Sees Dr End for non ischemic CM and lipid clinic for familial hyperlipidemia - stable period on low dose ACEI and toprol XL. Also has stable moderate BICA disease. She is now on praluent 147m monthly for cholesterol control.   Healthy diet and lifestyle efforts have led to weight loss over 2 yrs (~50 lbs). 17 lbs in the past 6 months.   Preventative: Colon cancer screening - colonoscopy 06/2013 mod diverticulosis, 3 tubular adenomas, int hem rpt 5 yrs ([place] Plan  COLONOSCOPY 06/2018 - TA, colonic angiodysplastic lesion, mod diverticulosis, rpt 5 yrs ([place] Plan  Well woman - with prior PCP Dr. SRocky Link S/p hysterectomy and oophorectomy for benign reason 1990s.  Mammogram - 11/2019 DEXA 2013 - WNL Flu shotyearly [place] vaccine [place] 08/2019 Pneumovax 2014.prevnar 08/2013 Tdap 2014 zostavax2012. shingrix - discussed- high deductible so would be expensive. Advanced directives: has set up through lawyer. Husband then daughter are HCPOA. Asked to  bring me copy. Seat belt use discussed Sunscreen use discussed. No changing moles on skin.  Non smokers EtOH - none Dentist Q6 mo Eye exam yearly  Bowel - no constipation  Bladder - no incontinence   Lives with husband and 2 cats. 2 grown children, 5 grandchildren  Occupation: retired, worked in [employer] Activity: regular walking Diet: fruits/vegetables daily, good water     Relevant past medical, surgical, family and social history reviewed and updated as indicated. Interim medical history since our last visit reviewed. Allergies and medications reviewed and updated. Outpatient Medications Prior to Visit  Medication Sig Dispense Refill  . Acetaminophen (ARTHRITIS PAIN RELIEF PO) Take by mouth.    . Cholecalciferol (VITAMIN D3) 1000 UNITS CAPS Take 1 capsule (1,000 Units total) by mouth daily.    .Mariane BaumgartenCalcium (STOOL SOFTENER PO) Take by mouth daily.     . Evolocumab with Infusor ([place] 420 MG/3.5ML SOCT Inject 420 mg into the skin every 30 (thirty) days. 3.6 mL 11  . gabapentin (NEURONTIN) 300 MG capsule TAKE ONE CAPSULE EVERY MORNING AND 2 CAPSULES AT BEDTIME 270 capsule 2  . lisinopril (ZESTRIL) 2.5 MG tablet Take 1 tablet by mouth once daily 90 tablet 1  . MAGNESIUM CITRATE PO Take by mouth.    . metoprolol succinate (TOPROL-XL) 25 MG  24 hr tablet Take 1/2 (one-half) tablet by mouth once daily 45 tablet 3  . Misc Natural Products (SINUS FORMULA PO) Take by mouth.    . Multiple Vitamin (MULTIVITAMIN) tablet Take 1 tablet by mouth daily.    . pantoprazole (PROTONIX) 40 MG tablet Take 1 tablet by mouth once daily 90 tablet 0  . sertraline (ZOLOFT) 100 MG tablet Take 1 tablet (100 mg total) by mouth daily. 90 tablet 3  . traMADol (ULTRAM) 50 MG tablet Take 1 tablet by mouth three times daily as needed 120 tablet 0  . vitamin B-12 (CYANOCOBALAMIN) 500 MCG tablet Take 500 mcg by mouth every other day.    . clonazePAM (KLONOPIN) 1 MG tablet Take 1 tablet by mouth  twice daily as needed 60 tablet 0   No facility-administered medications prior to visit.     Per HPI unless specifically indicated in ROS section below Review of Systems  Constitutional: Negative for activity change, appetite change, chills, fatigue, fever and unexpected weight change.  HENT: Negative for hearing loss.   Eyes: Negative for visual disturbance.  Respiratory: Negative for cough, chest tightness, shortness of breath and wheezing.   Cardiovascular: Negative for chest pain, palpitations and leg swelling.  Gastrointestinal: Positive for diarrhea (IBS related). Negative for abdominal distention, abdominal pain, blood in stool, constipation, nausea and vomiting.  Genitourinary: Negative for difficulty urinating and hematuria.  Musculoskeletal: Negative for arthralgias, myalgias and neck pain.  Skin: Negative for rash.  Neurological: Negative for dizziness, seizures, syncope and headaches.  Hematological: Negative for adenopathy. Bruises/bleeds easily.  Psychiatric/Behavioral: Negative for dysphoric mood. The patient is not nervous/anxious.    Objective:  BP 118/68 (BP Location: Left Arm, Patient Position: Sitting, Cuff Size: Normal)   Pulse 89   Temp 98 F (36.7 C) (Temporal)   Ht 5\' 1"  (1.549 m)   Wt 137 lb 9 oz (62.4 kg)   SpO2 96%   BMI 25.99 kg/m   Wt Readings from Last 3 Encounters:  03/30/20 137 lb 9 oz (62.4 kg)  02/22/20 140 lb 8 oz (63.7 kg)  09/22/19 154 lb 8 oz (70.1 kg)      Physical Exam Vitals and nursing note reviewed.  Constitutional:      General: She is not in acute distress.    Appearance: Normal appearance. She is well-developed. She is not ill-appearing.  HENT:     Head: Normocephalic and atraumatic.     Right Ear: Hearing, tympanic membrane, ear canal and external ear normal.     Left Ear: Hearing, tympanic membrane, ear canal and external ear normal.  Eyes:     General: No scleral icterus.    Extraocular Movements: Extraocular movements  intact.     Conjunctiva/sclera: Conjunctivae normal.     Pupils: Pupils are equal, round, and reactive to light.  Neck:     Thyroid: No thyroid mass or thyromegaly.     Vascular: No carotid bruit.  Cardiovascular:     Rate and Rhythm: Normal rate and regular rhythm.     Pulses: Normal pulses.          Radial pulses are 2+ on the right side and 2+ on the left side.     Heart sounds: Normal heart sounds. No murmur heard.   Pulmonary:     Effort: Pulmonary effort is normal. No respiratory distress.     Breath sounds: Normal breath sounds. No wheezing, rhonchi or rales.  Abdominal:     General: Abdomen is flat. Bowel sounds  are normal. There is no distension.     Palpations: Abdomen is soft. There is no mass.     Tenderness: There is no abdominal tenderness. There is no guarding or rebound.     Hernia: No hernia is present.  Musculoskeletal:        General: Normal range of motion.     Cervical back: Normal range of motion and neck supple.     Right lower leg: No edema.     Left lower leg: No edema.  Lymphadenopathy:     Cervical: No cervical adenopathy.  Skin:    General: Skin is warm and dry.     Findings: No rash.  Neurological:     General: No focal deficit present.     Mental Status: She is alert and oriented to person, place, and time.     Comments: CN grossly intact, station and gait intact  Psychiatric:        Mood and Affect: Mood normal.        Behavior: Behavior normal.        Thought Content: Thought content normal.        Judgment: Judgment normal.       Results for orders placed or performed in visit on 03/30/20  Ferritin  Result Value Ref Range   Ferritin 181.0 10.0 - 291.0 ng/mL  IBC panel  Result Value Ref Range   Iron 70 42 - 145 ug/dL   Transferrin 240.0 212.0 - 360.0 mg/dL   Saturation Ratios 20.8 20.0 - 50.0 %  Pathologist smear review  Result Value Ref Range   Path Review    TSH  Result Value Ref Range   TSH 1.40 0.35 - 4.50 uIU/mL  CBC with  Differential/Platelet  Result Value Ref Range   WBC 5.3 3.8 - 10.8 Thousand/uL   RBC 3.63 (L) 3.80 - 5.10 Million/uL   Hemoglobin 10.7 (L) 11.7 - 15.5 g/dL   HCT 31.8 (L) 35 - 45 %   MCV 87.6 80.0 - 100.0 fL   MCH 29.5 27.0 - 33.0 pg   MCHC 33.6 32.0 - 36.0 g/dL   RDW 14.0 11.0 - 15.0 %   Platelets 189 140 - 400 Thousand/uL   MPV 11.2 7.5 - 12.5 fL   Neutro Abs 3,159 1,500 - 7,800 cells/uL   Lymphs Abs 1,691 850 - 3,900 cells/uL   Absolute Monocytes 270 200 - 950 cells/uL   Eosinophils Absolute 148 15 - 500 cells/uL   Basophils Absolute 32 0 - 200 cells/uL   Neutrophils Relative % 59.6 %   Total Lymphocyte 31.9 %   Monocytes Relative 5.1 %   Eosinophils Relative 2.8 %   Basophils Relative 0.6 %  POCT Urinalysis Dipstick (Automated)  Result Value Ref Range   Color, UA yellow    Clarity, UA clear    Glucose, UA Negative Negative   Bilirubin, UA negative    Ketones, UA negative    Spec Grav, UA 1.025 1.010 - 1.025   Blood, UA negative    pH, UA 5.5 5.0 - 8.0   Protein, UA Negative Negative   Urobilinogen, UA 0.2 0.2 or 1.0 E.U./dL   Nitrite, UA negative    Leukocytes, UA Negative Negative   EKG - atrial fibrillation ~60s Assessment & Plan:  This visit occurred during the SARS-CoV-2 public health emergency.  Safety protocols were in place, including screening questions prior to the visit, additional usage of staff PPE, and extensive cleaning of exam room while observing  appropriate contact time as indicated for disinfecting solutions.   Problem List Items Addressed This Visit    Prediabetes    A1c higher despite weight loss. Will continue to watch.       Nonischemic cardiomyopathy (HCC)   MDD (major depressive disorder), recurrent episode, moderate (HCC)    Stable period on sertraline 114m and klonopin 112mnightly for sleep - see below.       Irregular heart beat   Relevant Orders   EKG 12-Lead (Completed)   CBC with Differential/Platelet (Completed)   Ambulatory  referral to Cardiology   HTN (hypertension)    Chronic, stable on current regimen - anticipate significant improvement with weight loss.       GERD (gastroesophageal reflux disease)    Continues daily PPI      Familial combined hyperlipidemia    Appreciate lipid clinic care - on praluent. Also noted benefit after recent weight loss.  The 10-year ASCVD risk score (GMikey BussingC JrBrooke Bonito et al., 2013) is: 12.7%   Values used to calculate the score:     Age: [age]ears     Sex: Female     Is Non-Hispanic African American: No     Diabetic: No     Tobacco smoker: No     Systolic Blood Pressure: 11062mHg     Is BP treated: Yes     HDL Cholesterol: 47.3 mg/dL     Total Cholesterol: 150 mg/dL       Encounter for general adult medical examination with abnormal findings - Primary    Preventative protocols reviewed and updated unless pt declined. Discussed healthy diet and lifestyle.       Ectatic abdominal aorta (HCC)    Rpt ultrasound due - will discuss next visit.       CKD (chronic kidney disease) stage 3, GFR 30-59 ml/min    Some deterioration noted over the past 6 months despite weight loss. Check urinalysis today. Consider renal imaging.       Relevant Orders   POCT Urinalysis Dipstick (Automated) (Completed)   Chronic insomnia    Sleep initiation insomnia.  Likely related to depressed mood Longterm on klonopin [id number].  Reviewed possible association of prolonged benzo use with memory deficits  Suggested start slow taper - she will try 1/2 tab at night time.       Bilateral carotid artery stenosis    Will update carotid [place]     Relevant Orders   VAS [place]   Atrial fibrillation (HCCLometa  Initial concern for atrial fibrillation by EKG and we discussed starting anticoagulation given increased stroke risk (she would not want coumadin). However reviewing chart, she has h/o abnormal EKGs in the past with ?frequent PACs/MAT vs wandering pacemaker s/p cardiology evaluation in  the past.  For this reason I recommend cardiology evaluation prior to starting anticoagulant. Pt agrees with plan. Will see if we can expedite evaluation.  CHADSVASC2 score = 3 (age, gender, HTN).       Relevant Orders   TSH (Completed)   CBC with Differential/Platelet (Completed)   Ambulatory referral to Cardiology   RESOLVED: Anemia   Relevant Orders   Ferritin (Completed)   IBC panel (Completed)   Pathologist smear review (Completed)   Fecal occult blood, imunochemical (Completed)   CBC with Differential/Platelet (Completed)   Advanced care planning/counseling discussion    Advanced directives: has set up through lawyer. Husband then daughter are HCPOA. Asked to bring me copy.  Other Visit Diagnoses    Need for influenza vaccination       Relevant Orders   Flu Vaccine QUAD High Dose(Fluad) (Completed)       Meds ordered this encounter  Medications  . clonazePAM (KLONOPIN) 1 MG tablet    Sig: Take 1 tablet (1 mg total) by mouth at bedtime.    Dispense:  60 tablet    Refill:  0  . DISCONTD: apixaban (ELIQUIS) 5 MG TABS tablet    Sig: Take 1 tablet (5 mg total) by mouth 2 (two) times daily.    Dispense:  60 tablet    Refill:  3   Orders Placed This Encounter  Procedures  . Fecal occult blood, imunochemical    Standing Status:   Future    Number of Occurrences:   1    Standing Expiration Date:   03/30/2021  . Flu Vaccine QUAD High Dose(Fluad)  . Ferritin  . IBC panel  . Pathologist smear review  . TSH  . CBC with Differential/Platelet  . Ambulatory referral to Cardiology    Referral Priority:   Routine    Referral Type:   Consultation    Referral Reason:   Specialty Services Required    Requested Specialty:   Cardiology    Number of Visits Requested:   1  . POCT Urinalysis Dipstick (Automated)  . EKG 12-Lead    Patient instructions: Flu shot today  EKG today - new atrial fibrillation - I'd like you to return sooner to Dr Darnelle Bos office - we will call  you for appointment.  Labs and urine test and stool kit.  Bring [place] copy of your living will.  Double check on insurance coverage for shingrix vaccine, let us know if you'd like to receive here.  Congratulations on healthy changes for weight loss!  Return in 6 months for follow up visit.   Follow up plan: Return in about 6 months (around 09/27/2020), or if symptoms worsen or fail to improve, for follow up visit.  Ria Bush, MD

## 2020-03-30 NOTE — Assessment & Plan Note (Signed)
Will update carotid US.

## 2020-03-30 NOTE — Assessment & Plan Note (Addendum)
Advanced directives: has set up through lawyer. Husband then daughter are HCPOA. Asked to bring me copy.

## 2020-03-30 NOTE — Patient Instructions (Addendum)
Flu shot today  EKG today - new atrial fibrillation - I'd like you to return sooner to Dr Darnelle Bos office - we will call you for appointment.  Labs and urine test and stool kit.  Bring [place] copy of your living will.  Double check on insurance coverage for shingrix vaccine, let us know if you'd like to receive here.  Congratulations on healthy changes for weight loss!  Return in 6 months for follow up visit.   Health Maintenance After Age 73 After age 54, you are at a higher risk for certain long-term diseases and infections as well as injuries from falls. Falls are a major cause of broken bones and head injuries in people who are older than age 91. Getting regular preventive care can help to keep you healthy and well. Preventive care includes getting regular testing and making lifestyle changes as recommended by your health care provider. Talk with your health care provider about:  Which screenings and tests you should have. A screening is a test that checks for a disease when you have no symptoms.  A diet and exercise plan that is right for you. What should I know about screenings and tests to prevent falls? Screening and testing are the best ways to find a health problem early. Early diagnosis and treatment give you the best chance of managing medical conditions that are common after age 29. Certain conditions and lifestyle choices may make you more likely to have a fall. Your health care provider may recommend:  Regular vision checks. Poor vision and conditions such as cataracts can make you more likely to have a fall. If you wear glasses, make sure to get your prescription updated if your vision changes.  Medicine review. Work with your health care provider to regularly review all of the medicines you are taking, including over-the-counter medicines. Ask your health care provider about any side effects that may make you more likely to have a fall. Tell your health care provider if any medicines  that you take make you feel dizzy or sleepy.  Osteoporosis screening. Osteoporosis is a condition that causes the bones to get weaker. This can make the bones weak and cause them to break more easily.  Blood pressure screening. Blood pressure changes and medicines to control blood pressure can make you feel dizzy.  Strength and balance checks. Your health care provider may recommend certain tests to check your strength and balance while standing, walking, or changing positions.  Foot health exam. Foot pain and numbness, as well as not wearing proper footwear, can make you more likely to have a fall.  Depression screening. You may be more likely to have a fall if you have a fear of falling, feel emotionally low, or feel unable to do activities that you used to do.  Alcohol use screening. Using too much alcohol can affect your balance and may make you more likely to have a fall. What actions can I take to lower my risk of falls? General instructions  Talk with your health care provider about your risks for falling. Tell your health care provider if: ? You fall. Be sure to tell your health care provider about all falls, even ones that seem minor. ? You feel dizzy, sleepy, or off-balance.  Take over-the-counter and prescription medicines only as told by your health care provider. These include any supplements.  Eat a healthy diet and maintain a healthy weight. A healthy diet includes low-fat dairy products, low-fat (lean) meats, and fiber from  whole grains, beans, and lots of fruits and vegetables. Home safety  Remove any tripping hazards, such as rugs, cords, and clutter.  Install safety equipment such as grab bars in bathrooms and safety rails on stairs.  Keep rooms and walkways well-lit. Activity   Follow a regular exercise program to stay fit. This will help you maintain your balance. Ask your health care provider what types of exercise are appropriate for you.  If you need a cane  or walker, use it as recommended by your health care provider.  Wear supportive shoes that have nonskid soles. Lifestyle  Do not drink alcohol if your health care provider tells you not to drink.  If you drink alcohol, limit how much you have: ? 0-1 drink a day for women. ? 0-2 drinks a day for men.  Be aware of how much alcohol is in your drink. In the [place] one drink equals one typical bottle of beer (12 oz), one-half glass of wine (5 oz), or one shot of hard liquor (1 oz).  Do not use any products that contain nicotine or tobacco, such as cigarettes and e-cigarettes. If you need help quitting, ask your health care provider. Summary  Having a healthy lifestyle and getting preventive care can help to protect your health and wellness after age 56.  Screening and testing are the best way to find a health problem early and help you avoid having a fall. Early diagnosis and treatment give you the best chance for managing medical conditions that are more common for people who are older than age 30.  Falls are a major cause of broken bones and head injuries in people who are older than age 33. Take precautions to prevent a fall at home.  Work with your health care provider to learn what changes you can make to improve your health and wellness and to prevent falls. This information is not intended to replace advice given to you by your health care provider. Make sure you discuss any questions you have with your health care provider. Document Revised: 10/23/2018 Document Reviewed: 05/15/2017 Elsevier Patient Education  2020 [employer].

## 2020-03-30 NOTE — Assessment & Plan Note (Addendum)
Continues daily PPI.  

## 2020-03-30 NOTE — Assessment & Plan Note (Signed)
Appreciate lipid clinic care - on praluent. Also noted benefit after recent weight loss.  The 10-year ASCVD risk score Mikey Bussing DC Brooke Bonito., et al., 2013) is: 12.7%   Values used to calculate the score:     Age: 74 years     Sex: Female     Is Non-Hispanic African American: No     Diabetic: No     Tobacco smoker: No     Systolic Blood Pressure: 141 mmHg     Is BP treated: Yes     HDL Cholesterol: 47.3 mg/dL     Total Cholesterol: 150 mg/dL

## 2020-03-30 NOTE — Assessment & Plan Note (Signed)
Stable period on sertraline 100mg  and klonopin 1mg  nightly for sleep - see below.

## 2020-03-30 NOTE — Assessment & Plan Note (Signed)
Chronic, stable on current regimen - anticipate significant improvement with weight loss.

## 2020-03-30 NOTE — Assessment & Plan Note (Signed)
Presumed, overall stable. In h/o colonic angiodysplasia, check iFOB to eval for slow bleed contribution. Update iron levels as well.

## 2020-03-30 NOTE — Assessment & Plan Note (Signed)
A1c higher despite weight loss. Will continue to watch.

## 2020-03-31 LAB — CBC WITH DIFFERENTIAL/PLATELET
Absolute Monocytes: 270 cells/uL (ref 200–950)
Basophils Absolute: 32 cells/uL (ref 0–200)
Basophils Relative: 0.6 %
Eosinophils Absolute: 148 cells/uL (ref 15–500)
Eosinophils Relative: 2.8 %
HCT: 31.8 % — ABNORMAL LOW (ref 35.0–45.0)
Hemoglobin: 10.7 g/dL — ABNORMAL LOW (ref 11.7–15.5)
Lymphs Abs: 1691 cells/uL (ref 850–3900)
MCH: 29.5 pg (ref 27.0–33.0)
MCHC: 33.6 g/dL (ref 32.0–36.0)
MCV: 87.6 fL (ref 80.0–100.0)
MPV: 11.2 fL (ref 7.5–12.5)
Monocytes Relative: 5.1 %
Neutro Abs: 3159 cells/uL (ref 1500–7800)
Neutrophils Relative %: 59.6 %
Platelets: 189 10*3/uL (ref 140–400)
RBC: 3.63 10*6/uL — ABNORMAL LOW (ref 3.80–5.10)
RDW: 14 % (ref 11.0–15.0)
Total Lymphocyte: 31.9 %
WBC: 5.3 10*3/uL (ref 3.8–10.8)

## 2020-03-31 LAB — PATHOLOGIST SMEAR REVIEW

## 2020-04-01 ENCOUNTER — Telehealth: Payer: Self-pay

## 2020-04-01 ENCOUNTER — Other Ambulatory Visit (INDEPENDENT_AMBULATORY_CARE_PROVIDER_SITE_OTHER): Payer: Medicare HMO

## 2020-04-01 DIAGNOSIS — N1832 Chronic kidney disease, stage 3b: Secondary | ICD-10-CM | POA: Diagnosis not present

## 2020-04-01 DIAGNOSIS — D631 Anemia in chronic kidney disease: Secondary | ICD-10-CM

## 2020-04-01 LAB — FECAL OCCULT BLOOD, GUAIAC: Fecal Occult Blood: NEGATIVE

## 2020-04-01 LAB — FECAL OCCULT BLOOD, IMMUNOCHEMICAL: Fecal Occult Bld: NEGATIVE

## 2020-04-01 NOTE — Telephone Encounter (Signed)
Lvm asking pt to call back.  Need to relay results and Dr. Synthia Innocent message.  Also, mailed results to pt.  Labs: Your urine test returned normal. Your thyroid and iron levels returned normal. You remain anemic with hemoglobin level at 10.7.  This is likely related to anemia of chronic kidney disease.  We will continue to watch your kidney function.

## 2020-04-04 ENCOUNTER — Encounter: Payer: Self-pay | Admitting: Family Medicine

## 2020-04-05 ENCOUNTER — Other Ambulatory Visit: Payer: Self-pay | Admitting: Internal Medicine

## 2020-04-12 ENCOUNTER — Other Ambulatory Visit: Payer: Self-pay | Admitting: Family Medicine

## 2020-04-13 ENCOUNTER — Ambulatory Visit (INDEPENDENT_AMBULATORY_CARE_PROVIDER_SITE_OTHER): Payer: Medicare HMO | Admitting: Internal Medicine

## 2020-04-13 ENCOUNTER — Other Ambulatory Visit: Payer: Self-pay

## 2020-04-13 ENCOUNTER — Encounter: Payer: Self-pay | Admitting: Internal Medicine

## 2020-04-13 VITALS — BP 100/70 | HR 80 | Ht 63.0 in | Wt 137.0 lb

## 2020-04-13 DIAGNOSIS — I428 Other cardiomyopathies: Secondary | ICD-10-CM | POA: Diagnosis not present

## 2020-04-13 DIAGNOSIS — I6523 Occlusion and stenosis of bilateral carotid arteries: Secondary | ICD-10-CM

## 2020-04-13 DIAGNOSIS — I4819 Other persistent atrial fibrillation: Secondary | ICD-10-CM

## 2020-04-13 DIAGNOSIS — I10 Essential (primary) hypertension: Secondary | ICD-10-CM | POA: Diagnosis not present

## 2020-04-13 DIAGNOSIS — E782 Mixed hyperlipidemia: Secondary | ICD-10-CM | POA: Diagnosis not present

## 2020-04-13 MED ORDER — RIVAROXABAN 15 MG PO TABS
15.0000 mg | ORAL_TABLET | Freq: Every day | ORAL | 2 refills | Status: DC
Start: 2020-04-13 — End: 2020-07-06

## 2020-04-13 MED ORDER — RIVAROXABAN 15 MG PO TABS: 15.0000 mg | ORAL_TABLET | Freq: Every day | ORAL | 2 refills | Status: DC

## 2020-04-13 NOTE — Patient Instructions (Signed)
Medication Instructions:  Your physician has recommended you make the following change in your medication:  1- START Xarelto 15 mg (1 tablet) by mouth once a day with supper.  *If you need a refill on your cardiac medications before your next appointment, please call your pharmacy*  Lab Work: NONE If you have labs (blood work) drawn today and your tests are completely normal, you will receive your results only by: Marland Kitchen MyChart Message (if you have MyChart) OR . A paper copy in the mail If you have any lab test that is abnormal or we need to change your treatment, we will call you to review the results.   Testing/Procedures: Your physician has requested that you have an echocardiogram (ok if it is not until after the cardioversion). Echocardiography is a painless test that uses sound waves to create images of your heart. It provides your doctor with information about the size and shape of your heart and how well your heart's chambers and valves are working. This procedure takes approximately one hour. There are no restrictions for this procedure. You may get an IV, if needed, to receive an ultrasound enhancing agent through to better visualize your heart.   Follow-Up: At [facility], you and your health needs are our priority.  As part of our continuing mission to provide you with exceptional heart care, we have created designated Provider Care Teams.  These Care Teams include your primary Cardiologist (physician) and Advanced Practice Providers (APPs -  Physician Assistants and Nurse Practitioners) who all work together to provide you with the care you need, when you need it.  We recommend signing up for the patient portal called "MyChart".  Sign up information is provided on this After Visit Summary.  MyChart is used to connect with patients for Virtual Visits (Telemedicine).  Patients are able to view lab/test results, encounter notes, upcoming appointments, etc.  Non-urgent messages can be  sent to your provider as well.   To learn more about what you can do with MyChart, go to [email or web address].    Your next appointment:   3 week(s)  The format for your next appointment:   In Person  Provider:   You may see one of the following Advanced Practice Providers on your designated Care Team:    Murray Hodgkins, NP  Christell Faith, PA-C  Marrianne Mood, PA-C  Cadence [place], [place]

## 2020-04-13 NOTE — Progress Notes (Signed)
Follow-up Outpatient Visit Date: 04/13/2020  Primary Care Provider: Ria White, [place] [address]  Chief Complaint: Abnormal heart rhythm  HPI:  Ann White is a 74 y.o. female with history of non-ischemic cardiomyopathy diagnosed in 04/2017 (MPI without perfusion defects LVEF as low as 35-40% but normalized on repeat echo in 04/2018), carotid artery stenosis, abdominal aorta ectasia, right calf DVT, hypertension, hyperlipidemia, and chronic kidney disease, who presents for follow-up of cardiomyopathy.  I last saw her in 05/2019, at which time she reported continued mild fatigue at the [place] of the day.  She also complained of achiness in her cast the Fransico Sciandra today.  She otherwise was feeling well.  She was seen for routine follow-up by her PCP, Dr. Danise Mina, earlier this month and was noted to be in atrial fibrillation.  Ann White reports feeling well other than intermittent brief orthostatic lightheadedness.  She denies chest pain, shortness of breath, palpitations, and edema.  She has lost over 60 pounds in the last 1-2 years through dietary modifications.  She reports feeling much better with this weight loss.  Ann White denies a history of atrial fibrillation.  She has not had any bleeding she is not currently taking any blood thinners.  --------------------------------------------------------------------------------------------------  Cardiovascular History & Procedures: Cardiovascular Problems:  Persistent atrial fibrillation  Nonischemic cardiomyopathy  Carotid artery stenosis  Risk Factors:  Hypertension, hyperlipidemia, and age greater than 73  Cath/PCI:  None  CV Surgery:  None  EP Procedures and Devices:  48 hour Holter monitor (04/29/17): Sinus rhythm with frequent PACs and atrial runs, lasting up to 34 beats.  Non-Invasive Evaluation(s):  Carotid Doppler (04/17/2019): 40-59% stenosis in the carotid arteries bilaterally.   Antegrade flow in the vertebral arteries.  Normal subclavian flow.  TTE (04/24/18): Normal LV size. LVEF 55-60% with normal wall motion. Grade 2 diastolic dysfunction. Mild left atrial enlargement. Normal RV size and function. Mild pulmonary hypertension (PASP 41 mmHg).  Exercise MPI (06/12/17): No evidence of ischemia. LVEF 56%. Hypertensive blood pressure response to exercise.  Echo (04/29/17): Normal LV size with moderately reduced contraction. LVEF 35-40% with global hypokinesis. Mild MR. Mild left atrial enlargement. Normal RV size and function. Normal PA pressure.  Carotid Doppler (03/25/17):Heterogeneous plaque, bilaterally. Stable, 40-59% RICA stenosis. IRWERX,5-40% LICA stenosis. Stable, >50% RECA stenosis. Patent vertebral arteries with antegrade flow. Normal subclavian arteries, bilaterally.  Recent CV Pertinent Labs: Lab Results  Component Value Date   CHOL 150 03/25/2020   CHOL 191 09/14/2019   HDL 47.30 03/25/2020   HDL 59 09/14/2019   LDLCALC 65 03/25/2020   LDLCALC 107 (H) 09/14/2019   LDLDIRECT 132.0 03/18/2019   TRIG 191.0 (H) 03/25/2020   CHOLHDL 3 03/25/2020   INR 1.5 09/22/2015   INR 7.0 Repeated and verified X2. (HH) 06/13/2015   K 4.8 03/25/2020   MG 2.2 04/17/2017   BUN 32 (H) 03/25/2020   CREATININE 1.47 (H) 03/25/2020   CREATININE 1.19 07/26/2011    Past medical and surgical history were reviewed and updated in EPIC.  Current Meds  Medication Sig  . Acetaminophen (ARTHRITIS PAIN RELIEF PO) Take by mouth.  . Cholecalciferol (VITAMIN D3) 1000 UNITS CAPS Take 1 capsule (1,000 Units total) by mouth daily.  . clonazePAM (KLONOPIN) 1 MG tablet Take 1 tablet (1 mg total) by mouth at bedtime. (Patient taking differently: Take 0.5 mg by mouth at bedtime. )  . Docusate Calcium (STOOL SOFTENER PO) Take by mouth daily.   . Evolocumab with  Infusor (REPATHA PUSHTRONEX SYSTEM) 420 MG/3.5ML SOCT Inject 420 mg into the skin every 30 (thirty) days.  Marland Kitchen gabapentin  (NEURONTIN) 300 MG capsule TAKE ONE CAPSULE EVERY MORNING AND 2 CAPSULES AT BEDTIME (Patient taking differently: at bedtime. )  . lisinopril (ZESTRIL) 2.5 MG tablet Take 1 tablet by mouth once daily  . metoprolol succinate (TOPROL-XL) 25 MG 24 hr tablet Take 1/2 (one-half) tablet by mouth once daily  . Misc Natural Products (SINUS FORMULA PO) Take by mouth.  . Multiple Vitamin (MULTIVITAMIN) tablet Take 1 tablet by mouth daily.  . pantoprazole (PROTONIX) 40 MG tablet Take 1 tablet by mouth once daily  . sertraline (ZOLOFT) 100 MG tablet Take 1 tablet (100 mg total) by mouth daily.  . traMADol (ULTRAM) 50 MG tablet Take 1 tablet by mouth three times daily as needed  . vitamin B-12 (CYANOCOBALAMIN) 500 MCG tablet Take 500 mcg by mouth daily.     Allergies: Sulfa antibiotics, Nexletol [bempedoic acid], Tricor [fenofibrate], Zetia [ezetimibe], Contrast media [iodinated diagnostic agents], Lipitor [atorvastatin], and Pravastatin  Social History   Tobacco Use  . Smoking status: Former Smoker    Packs/day: 0.25    Years: 3.00    Pack years: 0.75    Types: Cigarettes    Quit date: 1990    Years since quitting: 31.7  . Smokeless tobacco: Never Used  . Tobacco comment: minimal smoking history  Vaping Use  . Vaping Use: Never used  Substance Use Topics  . Alcohol use: No    Alcohol/week: 0.0 standard drinks  . Drug use: No    Family History  Problem Relation Age of Onset  . CAD Mother 56       MI  . Hypertension Mother   . Parkinson's disease Father   . Diabetes Father   . CAD Father 24       MI  . Hyperlipidemia Sister   . Cancer Neg Hx   . Stroke Neg Hx   . Colon cancer Neg Hx   . Breast cancer Neg Hx   . Colon polyps Neg Hx   . Esophageal cancer Neg Hx   . Stomach cancer Neg Hx   . Rectal cancer Neg Hx     Review of Systems: A 12-system review of systems was performed and was negative except as noted in the  HPI.  --------------------------------------------------------------------------------------------------  Physical Exam: BP 100/70 (BP Location: Left Arm, Patient Position: Sitting, Cuff Size: Normal)   Pulse 80   Ht 5\' 3"  (1.6 m)   Wt 137 lb (62.1 kg)   SpO2 97%   BMI 24.27 kg/m   General:  NAD.  Accompanied by her husband. HEENT: No conjunctival pallor or scleral icterus. Facemask in place. Neck: Supple without lymphadenopathy, thyromegaly, JVD, or HJR. Lungs: Normal work of breathing. Clear to auscultation bilaterally without wheezes or crackles. Heart: Irregularly irregular without murmurs, rubs, or gallops. Abd: Bowel sounds present. Soft, NT/ND without hepatosplenomegaly Ext: Trace pretibial edema. Skin: Warm and dry without rash.  EKG:  Atrial fibrillation (ventricular rate 80 bpm) with low voltage and nonspecific T wave changes.  Atrial fibrillation is new since last tracing in our office on 05/22/2019.  Lab Results  Component Value Date   WBC 5.3 03/30/2020   HGB 10.7 (L) 03/30/2020   HCT 31.8 (L) 03/30/2020   MCV 87.6 03/30/2020   PLT 189 03/30/2020    Lab Results  Component Value Date   NA 139 03/25/2020   K 4.8 03/25/2020   CL  104 03/25/2020   CO2 28 03/25/2020   BUN 32 (H) 03/25/2020   CREATININE 1.47 (H) 03/25/2020   GLUCOSE 71 03/25/2020   ALT 10 03/25/2020    Lab Results  Component Value Date   CHOL 150 03/25/2020   HDL 47.30 03/25/2020   LDLCALC 65 03/25/2020   LDLDIRECT 132.0 03/18/2019   TRIG 191.0 (H) 03/25/2020   CHOLHDL 3 03/25/2020    --------------------------------------------------------------------------------------------------  ASSESSMENT AND PLAN: Persistent atrial fibrillation: Newly diagnosed at office visit with Dr. Lucretia Roers earlier this month.  Other than mild orthostatic lightheadedness, Ann White is asymptomatic.  Given a CHADSVASc score of at least 5 (age, gender, hypertension, cardiomyopathy, and DVT), anticoagulation is  indication for stroke prevention.  We have discussed treatment options, including warfarin and DOAC and their associated side-effects/risks.  Ann White is concerned about the cost of DOAC therapy but refuses warfarin.  We have agreed to start rivaroxaban 15 mg daily (adjusted for GFR < 50) today.  We will provide samples as well as an application for pharmacy assistance.  Recent labs showed normal TSH and electrolytes.  Modest chronic anemia was stable.  We will arrange for a TTE as well as follow-up in ~3 weeks.  If Ann White remains in atrial fibrillation at that time, we will make arrangements for elective cardioversion once she has completed at least 4 weeks of anticoagulation.  We will continue her current dose of metoprolol.  Non-ischemic cardiomyopathy: LVEF normalized on most recent echo in 2019.  Ms. Kissel appears euvolemic and is asymptomatic.  In light of new a-fib, I have recommended repeating an echo (ideally after NSR has been restored).  We will continue her current doses of metoprolol succinate and lisinopril.  Carotid artery stenosis: No new neurologic sytmptoms reported.  Continue surveillance Duplex, as ordered by Dr. Danise Mina.  Hyperlipidemia: Continue Repatha for goal LDL < 70 (65 earlier this month), given carotid artery stenosis.  Hypertension: Blood pressure borderline low today.  No medication changes at this time, though we may need to consider deescalation if BP remains soft and orthostatic lightheadedness worsens.  Follow-up: Return to clinic in 3 weeks.  Ann Bush, MD 04/13/2020 1:22 PM

## 2020-04-14 ENCOUNTER — Ambulatory Visit (INDEPENDENT_AMBULATORY_CARE_PROVIDER_SITE_OTHER): Payer: Medicare HMO

## 2020-04-14 DIAGNOSIS — I4819 Other persistent atrial fibrillation: Secondary | ICD-10-CM | POA: Diagnosis not present

## 2020-04-14 LAB — ECHOCARDIOGRAM COMPLETE
Area-P 1/2: 2.26 cm2
S' Lateral: 3.4 cm

## 2020-04-15 ENCOUNTER — Telehealth: Payer: Self-pay | Admitting: Internal Medicine

## 2020-04-15 NOTE — Telephone Encounter (Signed)
Patient has a question about form for med assistance .  Please call.

## 2020-04-18 NOTE — Telephone Encounter (Signed)
No answer. Left message to call back on home number. No answer. No voicemail on mobile number.

## 2020-04-18 NOTE — Telephone Encounter (Signed)
Patient returning call.

## 2020-04-19 NOTE — Telephone Encounter (Signed)
Spoke with patient. She wanted to make sure she was signing the correct places.  We went over page 1 and 2 and she verbalized understanding. She will finish the application and mail it off.

## 2020-05-05 ENCOUNTER — Ambulatory Visit: Payer: Medicare HMO | Admitting: Nurse Practitioner

## 2020-05-05 ENCOUNTER — Other Ambulatory Visit: Payer: Self-pay

## 2020-05-05 ENCOUNTER — Encounter: Payer: Self-pay | Admitting: Nurse Practitioner

## 2020-05-05 VITALS — BP 126/80 | HR 80 | Ht 63.0 in | Wt 137.0 lb

## 2020-05-05 DIAGNOSIS — I6523 Occlusion and stenosis of bilateral carotid arteries: Secondary | ICD-10-CM | POA: Diagnosis not present

## 2020-05-05 DIAGNOSIS — I4819 Other persistent atrial fibrillation: Secondary | ICD-10-CM | POA: Diagnosis not present

## 2020-05-05 DIAGNOSIS — N183 Chronic kidney disease, stage 3 unspecified: Secondary | ICD-10-CM | POA: Diagnosis not present

## 2020-05-05 DIAGNOSIS — E785 Hyperlipidemia, unspecified: Secondary | ICD-10-CM | POA: Diagnosis not present

## 2020-05-05 DIAGNOSIS — I428 Other cardiomyopathies: Secondary | ICD-10-CM

## 2020-05-05 DIAGNOSIS — I10 Essential (primary) hypertension: Secondary | ICD-10-CM

## 2020-05-05 DIAGNOSIS — Z01810 Encounter for preprocedural cardiovascular examination: Secondary | ICD-10-CM | POA: Diagnosis not present

## 2020-05-05 NOTE — Patient Instructions (Signed)
Medication Instructions:  Your physician recommends that you continue on your current medications as directed. Please refer to the Current Medication list given to you today.  *If you need a refill on your cardiac medications before your next appointment, please call your pharmacy*   Lab Work: 1- Your physician recommends that you return for lab work in: [place], CBC.  2- COVID PRE- TEST: You will need a COVID TEST prior to the procedure:  LOCATION: [address] Testing site.  DATE/TIME:  On Friday, May 13, 2020 sometime between 8 am and 1 pm.  If you have labs (blood work) drawn today and your tests are completely normal, you will receive your results only by: Marland Kitchen MyChart Message (if you have MyChart) OR . A paper copy in the mail If you have any lab test that is abnormal or we need to change your treatment, we will call you to review the results.   Testing/Procedures:  You are scheduled for a Cardioversion on __11/2/21____ with Dr.___END____ Please arrive at the [place] [facility] at __06:30_ a.m. on the day of your procedure.  DIET INSTRUCTIONS:  Nothing to eat or drink after midnight except your medications with a              sip of water.         1) Labs: ____TODAY_____  2) Medications:  YOU MAY TAKE ALL of your remaining medications with a small amount of water.  3) Must have a responsible person to drive you home.  4) Bring a current list of your medications and current insurance cards.    If you have any questions after you get home, please call the office at [phone]   Follow-Up: At [facility], you and your health needs are our priority.  As part of our continuing mission to provide you with exceptional heart care, we have created designated Provider Care Teams.  These Care Teams include your primary Cardiologist (physician) and Advanced Practice Providers (APPs -  Physician Assistants and Nurse Practitioners) who all work  together to provide you with the care you need, when you need it.  We recommend signing up for the patient portal called "MyChart".  Sign up information is provided on this After Visit Summary.  MyChart is used to connect with patients for Virtual Visits (Telemedicine).  Patients are able to view lab/test results, encounter notes, upcoming appointments, etc.  Non-urgent messages can be sent to your provider as well.   To learn more about what you can do with MyChart, go to [email or web address].    Your next appointment:   2 week(s) after cardioversion which is on 05/17/20  The format for your next appointment:   In Person  Provider:   You may see Nelva Bush, MD or one of the following Advanced Practice Providers on your designated Care Team:    Murray Hodgkins, NP  Christell Faith, PA-C  Marrianne Mood, PA-C  Cadence Kathlen Mody, PA-C     Electrical Cardioversion Electrical cardioversion is the delivery of a jolt of electricity to restore a normal rhythm to the heart. A rhythm that is too fast or is not regular keeps the heart from pumping well. In this procedure, sticky patches or metal paddles are placed on the chest to deliver electricity to the heart from a device. This procedure may be done in an emergency if:  There is low or no blood pressure as a result of the heart rhythm.  Normal  rhythm must be restored as fast as possible to protect the brain and heart from further damage.  It may save a life. This may also be a scheduled procedure for irregular or fast heart rhythms that are not immediately life-threatening. Tell a health care provider about:  Any allergies you have.  All medicines you are taking, including vitamins, herbs, eye drops, creams, and over-the-counter medicines.  Any problems you or family members have had with anesthetic medicines.  Any blood disorders you have.  Any surgeries you have had.  Any medical conditions you have.  Whether you are  pregnant or may be pregnant. What are the risks? Generally, this is a safe procedure. However, problems may occur, including:  Allergic reactions to medicines.  A blood clot that breaks free and travels to other parts of your body.  The possible return of an abnormal heart rhythm within hours or days after the procedure.  Your heart stopping (cardiac arrest). This is rare. What happens before the procedure? Medicines  Your health care provider may have you start taking: ? Blood-thinning medicines (anticoagulants) so your blood does not clot as easily. ? Medicines to help stabilize your heart rate and rhythm.  Ask your health care provider about: ? Changing or stopping your regular medicines. This is especially important if you are taking diabetes medicines or blood thinners. ? Taking medicines such as aspirin and ibuprofen. These medicines can thin your blood. Do not take these medicines unless your health care provider tells you to take them. ? Taking over-the-counter medicines, vitamins, herbs, and supplements. General instructions  Follow instructions from your health care provider about eating or drinking restrictions.  Plan to have someone take you home from the hospital or clinic.  If you will be going home right after the procedure, plan to have someone with you for 24 hours.  Ask your health care provider what steps will be taken to help prevent infection. These may include washing your skin with a germ-killing soap. What happens during the procedure?   An IV will be inserted into one of your veins.  Sticky patches (electrodes) or metal paddles may be placed on your chest.  You will be given a medicine to help you relax (sedative).  An electrical shock will be delivered. The procedure may vary among health care providers and hospitals. What can I expect after the procedure?  Your blood pressure, heart rate, breathing rate, and blood oxygen level will be monitored  until you leave the hospital or clinic.  Your heart rhythm will be watched to make sure it does not change.  You may have some redness on the skin where the shocks were given. Follow these instructions at home:  Do not drive for 24 hours if you were given a sedative during your procedure.  Take over-the-counter and prescription medicines only as told by your health care provider.  Ask your health care provider how to check your pulse. Check it often.  Rest for 48 hours after the procedure or as told by your health care provider.  Avoid or limit your caffeine use as told by your health care provider.  Keep all follow-up visits as told by your health care provider. This is important. Contact a health care provider if:  You feel like your heart is beating too quickly or your pulse is not regular.  You have a serious muscle cramp that does not go away. Get help right away if:  You have discomfort in your chest.  You are dizzy or you feel faint.  You have trouble breathing or you are short of breath.  Your speech is slurred.  You have trouble moving an arm or leg on one side of your body.  Your fingers or toes turn cold or blue. Summary  Electrical cardioversion is the delivery of a jolt of electricity to restore a normal rhythm to the heart.  This procedure may be done right away in an emergency or may be a scheduled procedure if the condition is not an emergency.  Generally, this is a safe procedure.  After the procedure, check your pulse often as told by your health care provider. This information is not intended to replace advice given to you by your health care provider. Make sure you discuss any questions you have with your health care provider. Document Revised: 02/02/2019 Document Reviewed: 02/02/2019 Elsevier Patient Education  [place].

## 2020-05-05 NOTE — Progress Notes (Signed)
Cardiology Clinic Note   Patient Name: Ann White Date of Encounter: 05/05/2020  Primary Care Provider:  Ria Bush, MD Primary Cardiologist:  Nelva Bush, MD  Patient Profile    74 year old female with a history of hypertension, hyperlipidemia, GERD, bilateral nonobstructive carotid stenosis, frequent PACs, CKD III, nonischemic cardiomyopathy, right calf DVT, abdominal aortic ectasia, and persistent atrial fibrillation, who presents for follow-up of A. fib.  Past Medical History    Past Medical History:  Diagnosis Date  . Allergy   . Anxiety   . Arthritis   . Carotid stenosis    a. 02/2016 Carotid U/S: Bilateral 40-59%; b. 03/2017 Carotid U/S: RICA 15-17%, LICA 6-16%; c. 01/3709 Carotid U/S: bilat 40-59% ICA dzs.  . Cervical spondylosis    s/p spine injections  . CKD (chronic kidney disease), stage III ([place])   . Depression   . Ectatic abdominal aorta ([place]) 2016   rpt [place] 5 yrs  . GERD (gastroesophageal reflux disease)   . History of chicken pox   . History of diverticulitis of colon   . HLD (hyperlipidemia)   . HTN (hypertension)   . Irritable bowel syndrome with constipation   . Lichen sclerosus et atrophicus   . NICM (nonischemic cardiomyopathy) ([place])    a. 04/2017 Echo: EF 35-40%, diff HK; b. 05/2017 MV: EF 56%, no ischemia/infarct; c. 04/2018 Echo: EF 55-60%, no rwma, Gr2 DD; d. 03/2020 Echo: EF 40-45%, nl PASP, mod dil LA, mild to mod dil RA, Triv MR.  . Obesity   . Persistent atrial fibrillation ([place])    a. Dx 03/2020. CHA2DS2VASc = 5-->xarelto 15mg  daily.  . Premature atrial contraction    a. 04/2017 48hr Holter: Predominant rhythm - sinus. Rare PVC's, freq PAC's with freq atrial runs up to 34 beats-->metoprolol started.  . Seasonal allergic rhinitis    Past Surgical History:  Procedure Laterality Date  . bladder tack  1990s  . BREAST BIOPSY Right 03/05/2012    benign  . CARPAL TUNNEL RELEASE  2004, 2013   bilateral  . CHOLECYSTECTOMY  1990s    . COLONOSCOPY  2002  . COLONOSCOPY  06/2013   mod diverticulosis, 3 tubular adenomas, int hem rpt 5 yrs [facility] Plan)  . COLONOSCOPY  06/2018   TA,c olonic angiodysplastic lesion, mod diverticulosis, rpt 5 yrs [facility] Plan)  . DEXA  07/2011   normal, T score -0.3  . ESOPHAGOGASTRODUODENOSCOPY ENDOSCOPY  2004   normal [facility] Plan)  . TOTAL ABDOMINAL HYSTERECTOMY W/ BILATERAL SALPINGOOPHORECTOMY  1990s   complete, dysmenorrhea and fibroids   Allergies  Allergies  Allergen Reactions  . Sulfa Antibiotics Swelling    Causes mouth to swell  . Nexletol [Bempedoic Acid]     Dizziness, "eyes felt funny"  . Tricor [Fenofibrate] Other (See Comments)    myalgias  . Zetia [Ezetimibe] Other (See Comments)    Dizziness  . Contrast Media [Iodinated Diagnostic Agents] Other (See Comments)    Oral contrast caused mouth blisters  . Lipitor [Atorvastatin] Other (See Comments)    myalgias  . Pravastatin Other (See Comments)    myalgias    History of Present Illness    74 year old female with the above past medical history including hypertension, hyperlipidemia, GERD, bilateral nonobstructive carotid arterial disease, CKD III, right calf DVT, abdominal aortic ectasia, depression, frequent PACs, and nonischemic cardiomyopathy.  In 2018, she was evaluated with Holter monitoring in the setting of irregular heartbeats on examination and was found to have frequent PACs with brief runs of atrial tachycardia.  Echocardiogram showed an EF of 35 to 40% and she subsequently underwent stress testing which was nonischemic.  She has been managed with beta-blocker and ACE inhibitor therapy and follow-up echocardiogram in October 2019 showed an improved EF to 55-60%.  She was seen by her primary care provider in early September and was found to be in atrial fibrillation.  She follow-up in cardiology clinic on September 29 at which time she reported intermittent, brief, orthostatic lightheadedness.  She was otherwise asymptomatic.  She  was placed on rivaroxaban 15 mg daily and subsequently underwent 2D echocardiography which showed worsening of LV function with EF now back down to 40-45%.  Since her last visit, she has continued to do well.  She denies any symptoms of palpitations, chest pain, dyspnea, or edema.  She remains unaware that she is in atrial fibrillation, which is confirmed by EKG today.  She has tolerated Xarelto therapy well.  Heart rate is 67 on Toprol therapy.  She is interested in pursuing cardioversion.  Home Medications    Prior to Admission medications   Medication Sig Start Date End Date Taking? Authorizing Provider  Acetaminophen (ARTHRITIS PAIN RELIEF PO) Take by mouth as needed.    Yes [provider]  Cholecalciferol (VITAMIN D3) 1000 UNITS CAPS Take 1 capsule (1,000 Units total) by mouth daily. 03/17/15  Yes Ria Bush, MD  clonazePAM (KLONOPIN) 1 MG tablet Take 0.5 mg by mouth at bedtime.   Yes [provider]  Docusate Calcium (STOOL SOFTENER PO) Take by mouth as needed.    Yes [provider]  Evolocumab with Infusor (White) 420 MG/3.5ML SOCT Inject 420 mg into the skin every 30 (thirty) days. 09/17/19  Yes End, Harrell Gave, MD  gabapentin (NEURONTIN) 300 MG capsule Take 300 mg by mouth at bedtime.   Yes [provider]  lisinopril (ZESTRIL) 2.5 MG tablet Take 1 tablet by mouth once daily 04/05/20  Yes End, Harrell Gave, MD  metoprolol succinate (TOPROL-XL) 25 MG 24 hr tablet Take 1/2 (one-half) tablet by mouth once daily 05/22/19  Yes End, Harrell Gave, MD  Misc Natural Products (SINUS FORMULA PO) Take by mouth as needed.    Yes [provider]  Multiple Vitamin (MULTIVITAMIN) tablet Take 1 tablet by mouth daily.   Yes [provider]  pantoprazole (PROTONIX) 40 MG tablet Take 1 tablet by mouth once daily 03/28/20  Yes Ria Bush, MD  Rivaroxaban (XARELTO) 15 MG TABS tablet Take 1 tablet (15 mg total) by mouth daily with  supper. 04/13/20  Yes End, Harrell Gave, MD  sertraline (ZOLOFT) 100 MG tablet Take 1 tablet by mouth once daily 04/13/20  Yes Ria Bush, MD  traMADol Veatrice Bourbon) 50 MG tablet Take 1 tablet by mouth three times daily as needed 08/23/19  Yes Ria Bush, MD  vitamin B-12 (CYANOCOBALAMIN) 500 MCG tablet Take 500 mcg by mouth daily.    Yes [provider]  clonazePAM (KLONOPIN) 1 MG tablet Take 1 tablet (1 mg total) by mouth at bedtime. Patient taking differently: Take 0.5 mg by mouth at bedtime.  03/30/20   Ria Bush, MD  gabapentin (NEURONTIN) 300 MG capsule TAKE ONE CAPSULE EVERY MORNING AND 2 CAPSULES AT BEDTIME Patient taking differently: at bedtime.  08/23/19   Ria Bush, MD    Family History    Family History  Problem Relation Age of Onset  . CAD Mother 24       MI  . Hypertension Mother   . Parkinson's disease Father   .  Diabetes Father   . CAD Father 24       MI  . Hyperlipidemia Sister   . Cancer Neg Hx   . Stroke Neg Hx   . Colon cancer Neg Hx   . Breast cancer Neg Hx   . Colon polyps Neg Hx   . Esophageal cancer Neg Hx   . Stomach cancer Neg Hx   . Rectal cancer Neg Hx    She indicated that her mother is deceased. She indicated that her father is deceased. She indicated that her sister is alive. She indicated that her brother is alive. She indicated that the status of her neg hx is unknown.  Social History    Social History   Socioeconomic History  . Marital status: Married    Spouse name: Not on file  . Number of children: Not on file  . Years of education: Not on file  . Highest education level: Not on file  Occupational History  . Not on file  Tobacco Use  . Smoking status: Former Smoker    Packs/day: 0.25    Years: 3.00    Pack years: 0.75    Types: Cigarettes    Quit date: 1990    Years since quitting: 31.8  . Smokeless tobacco: Never Used  . Tobacco comment: minimal smoking history  Vaping Use  . Vaping Use: Never used   Substance and Sexual Activity  . Alcohol use: No    Alcohol/week: 0.0 standard drinks  . Drug use: No  . Sexual activity: Never  Other Topics Concern  . Not on file  Social History Narrative   Lives with husband and 2 cats.  2 grown children, 5 grandchildren.   Occupation: retired, worked in [employer]   Activity: no regular exercise.  Does walk in summer   Diet: fruits/vegetables daily, good water.   Social Determinants of Health   Financial Resource Strain: Low Risk   . Difficulty of Paying Living Expenses: Not hard at all  Food Insecurity: No Food Insecurity  . Worried About Charity fundraiser in the Last Year: Never true  . Ran Out of Food in the Last Year: Never true  Transportation Needs: No Transportation Needs  . Lack of Transportation (Medical): No  . Lack of Transportation (Non-Medical): No  Physical Activity: Inactive  . Days of Exercise per Week: 0 days  . Minutes of Exercise per Session: 0 min  Stress: No Stress Concern Present  . Feeling of Stress : Not at all  Social Connections:   . Frequency of Communication with Friends and Family: Not on file  . Frequency of Social Gatherings with Friends and Family: Not on file  . Attends Religious Services: Not on file  . Active Member of Clubs or Organizations: Not on file  . Attends Archivist Meetings: Not on file  . Marital Status: Not on file  Intimate Partner Violence: Not At Risk  . Fear of Current or Ex-Partner: No  . Emotionally Abused: No  . Physically Abused: No  . Sexually Abused: No     Review of Systems    General:  No chills, fever, night sweats or weight changes.  Cardiovascular:  No chest pain, dyspnea on exertion, edema, orthopnea, palpitations, paroxysmal nocturnal dyspnea. Dermatological: No rash, lesions/masses Respiratory: No cough, dyspnea Urologic: No hematuria, dysuria Abdominal:   No nausea, vomiting, diarrhea, bright red blood per rectum, melena, or hematemesis Neurologic:  No  visual changes, wkns, changes in mental status. All  other systems reviewed and are otherwise negative except as noted above.  Physical Exam    VS:  BP 126/80 (BP Location: Left Arm, Patient Position: Sitting, Cuff Size: Normal)   Pulse 80   Ht 5\' 3"  (1.6 m)   Wt 137 lb (62.1 kg)   SpO2 95%   BMI 24.27 kg/m  , BMI Body mass index is 24.27 kg/m. GEN: Well nourished, well developed, in no acute distress. HEENT: normal. Neck: Supple, no JVD, carotid bruits, or masses. Cardiac: IR, IR, no murmurs, rubs, or gallops. No clubbing, cyanosis, edema.  Radials/PT 2+ and equal bilaterally.  Respiratory:  Respirations regular and unlabored, clear to auscultation bilaterally. GI: Soft, nontender, nondistended, BS + x 4. MS: no deformity or atrophy. Skin: warm and dry, no rash. Neuro:  Strength and sensation are intact. Psych: Normal affect.  Accessory Clinical Findings    ECG personally reviewed by me today-atrial fibrillation, 67, nonspecific T changes- No acute changes  Lab Results  Component Value Date   WBC 5.3 03/30/2020   HGB 10.7 (L) 03/30/2020   HCT 31.8 (L) 03/30/2020   MCV 87.6 03/30/2020   PLT 189 03/30/2020   Lab Results  Component Value Date   CREATININE 1.47 (H) 03/25/2020   BUN 32 (H) 03/25/2020   NA 139 03/25/2020   K 4.8 03/25/2020   CL 104 03/25/2020   CO2 28 03/25/2020   Lab Results  Component Value Date   ALT 10 03/25/2020   AST 21 03/25/2020   ALKPHOS 39 03/25/2020   BILITOT 0.4 03/25/2020   Lab Results  Component Value Date   CHOL 150 03/25/2020   HDL 47.30 03/25/2020   LDLCALC 65 03/25/2020   LDLDIRECT 132.0 03/18/2019   TRIG 191.0 (H) 03/25/2020   CHOLHDL 3 03/25/2020    Lab Results  Component Value Date   HGBA1C 6.3 03/25/2020    Assessment & Plan   1.  Persistent atrial fibrillation: This was newly diagnosed in September of this year.  She has been asymptomatic and well rate controlled on beta-blocker therapy.  CHA2DS2VASc equals 5 in the  setting of LV dysfunction, hypertension, age, moderate nonobstructive carotid arterial disease, and female gender.  She was placed on rivaroxaban 15 mg daily on September 29 and reports good compliance and tolerance.  Recent echo shows recurrence of LV dysfunction with an EF of 40-45%.  We discussed this today.  She wishes to pursue a cardioversion.  I will obtain labs today and we will schedule her for Tuesday, November 2 with Dr. Saunders Revel.  2.  Nonischemic cardiomyopathy/heart failure with midrange fraction: EF previously 35 to 40% in October 2018 with nonischemic stress test at that time.  She did have recovery of LV function but in the setting of atrial fibrillation, recent echo again shows mild LV dysfunction with an EF of 40-45%.  Fortunately, she is well compensated, asymptomatic, and euvolemic on examination.  She remains on beta-blocker and ACE inhibitor therapy.  Plan for cardioversion/rhythm management of atrial fibrillation with future reassessment of LV function once in sinus rhythm.  3.  Essential hypertension: Blood pressure stable today on beta-blocker and ACE inhibitor therapy.  4.  Hyperlipidemia: She is on Repatha in the setting of statin and Zetia intolerances.  LDL was 65 in September of this year.  5.  Carotid arterial disease: Moderate nonobstructive disease by ultrasound in October 2020.  She remains on Repatha therapy with LDL at goal.  6.  Stage III chronic kidney disease: Creatinine 1.47 on  September 10.  She is on lisinopril therapy.  Follow-up labs today in preparation for cardioversion.  7.  Disposition: Follow-up CBC and basic metabolic panel today.  Plan for Covid swab next Friday and cardioversion November 2.  Follow-up in clinic approximately 2 weeks post cardioversion.  Murray Hodgkins, NP 05/05/2020, 10:00 AM

## 2020-05-05 NOTE — H&P (View-Only) (Signed)
Cardiology Clinic Note   Patient Name: Ann White Date of Encounter: 05/05/2020  Primary Care Provider:  Ria Bush, MD Primary Cardiologist:  Nelva Bush, MD  Patient Profile    74 year old female with a history of hypertension, hyperlipidemia, GERD, bilateral nonobstructive carotid stenosis, frequent PACs, CKD III, nonischemic cardiomyopathy, right calf DVT, abdominal aortic ectasia, and persistent atrial fibrillation, who presents for follow-up of A. fib.  Past Medical History    Past Medical History:  Diagnosis Date  . Allergy   . Anxiety   . Arthritis   . Carotid stenosis    a. 02/2016 Carotid U/S: Bilateral 40-59%; b. 03/2017 Carotid U/S: RICA 29-79%, LICA 8-92%; c. 05/9416 Carotid U/S: bilat 40-59% ICA dzs.  . Cervical spondylosis    s/p spine injections  . CKD (chronic kidney disease), stage III ([place])   . Depression   . Ectatic abdominal aorta ([place]) 2016   rpt [place] 5 yrs  . GERD (gastroesophageal reflux disease)   . History of chicken pox   . History of diverticulitis of colon   . HLD (hyperlipidemia)   . HTN (hypertension)   . Irritable bowel syndrome with constipation   . Lichen sclerosus et atrophicus   . NICM (nonischemic cardiomyopathy) ([place])    a. 04/2017 Echo: EF 35-40%, diff HK; b. 05/2017 MV: EF 56%, no ischemia/infarct; c. 04/2018 Echo: EF 55-60%, no rwma, Gr2 DD; d. 03/2020 Echo: EF 40-45%, nl PASP, mod dil LA, mild to mod dil RA, Triv MR.  . Obesity   . Persistent atrial fibrillation ([place])    a. Dx 03/2020. CHA2DS2VASc = 5-->xarelto 15mg  daily.  . Premature atrial contraction    a. 04/2017 48hr Holter: Predominant rhythm - sinus. Rare PVC's, freq PAC's with freq atrial runs up to 34 beats-->metoprolol started.  . Seasonal allergic rhinitis    Past Surgical History:  Procedure Laterality Date  . bladder tack  1990s  . BREAST BIOPSY Right 03/05/2012    benign  . CARPAL TUNNEL RELEASE  2004, 2013   bilateral  . CHOLECYSTECTOMY  1990s    . COLONOSCOPY  2002  . COLONOSCOPY  06/2013   mod diverticulosis, 3 tubular adenomas, int hem rpt 5 yrs [facility] Plan)  . COLONOSCOPY  06/2018   TA,c olonic angiodysplastic lesion, mod diverticulosis, rpt 5 yrs Fuller Plan)  . DEXA  07/2011   normal, T score -0.3  . ESOPHAGOGASTRODUODENOSCOPY ENDOSCOPY  2004   normal Fuller Plan)  . TOTAL ABDOMINAL HYSTERECTOMY W/ BILATERAL SALPINGOOPHORECTOMY  1990s   complete, dysmenorrhea and fibroids   Allergies  Allergies  Allergen Reactions  . Sulfa Antibiotics Swelling    Causes mouth to swell  . Nexletol [Bempedoic Acid]     Dizziness, "eyes felt funny"  . Tricor [Fenofibrate] Other (See Comments)    myalgias  . Zetia [Ezetimibe] Other (See Comments)    Dizziness  . Contrast Media [Iodinated Diagnostic Agents] Other (See Comments)    Oral contrast caused mouth blisters  . Lipitor [Atorvastatin] Other (See Comments)    myalgias  . Pravastatin Other (See Comments)    myalgias    History of Present Illness    74 year old female with the above past medical history including hypertension, hyperlipidemia, GERD, bilateral nonobstructive carotid arterial disease, CKD III, right calf DVT, abdominal aortic ectasia, depression, frequent PACs, and nonischemic cardiomyopathy.  In 2018, she was evaluated with Holter monitoring in the setting of irregular heartbeats on examination and was found to have frequent PACs with brief runs of atrial tachycardia.  Echocardiogram showed an EF of 35 to 40% and she subsequently underwent stress testing which was nonischemic.  She has been managed with beta-blocker and ACE inhibitor therapy and follow-up echocardiogram in October 2019 showed an improved EF to 55-60%.  She was seen by her primary care provider in early September and was found to be in atrial fibrillation.  She follow-up in cardiology clinic on September 29 at which time she reported intermittent, brief, orthostatic lightheadedness.  She was otherwise asymptomatic.  She  was placed on rivaroxaban 15 mg daily and subsequently underwent 2D echocardiography which showed worsening of LV function with EF now back down to 40-45%.  Since her last visit, she has continued to do well.  She denies any symptoms of palpitations, chest pain, dyspnea, or edema.  She remains unaware that she is in atrial fibrillation, which is confirmed by EKG today.  She has tolerated Xarelto therapy well.  Heart rate is 67 on Toprol therapy.  She is interested in pursuing cardioversion.  Home Medications    Prior to Admission medications   Medication Sig Start Date End Date Taking? Authorizing Provider  Acetaminophen (ARTHRITIS PAIN RELIEF PO) Take by mouth as needed.    Yes [provider]  Cholecalciferol (VITAMIN D3) 1000 UNITS CAPS Take 1 capsule (1,000 Units total) by mouth daily. 03/17/15  Yes Ria Bush, MD  clonazePAM (KLONOPIN) 1 MG tablet Take 0.5 mg by mouth at bedtime.   Yes [provider]  Docusate Calcium (STOOL SOFTENER PO) Take by mouth as needed.    Yes [provider]  Evolocumab with Infusor ([place]) 420 MG/3.5ML SOCT Inject 420 mg into the skin every 30 (thirty) days. 09/17/19  Yes End, Harrell Gave, MD  gabapentin (NEURONTIN) 300 MG capsule Take 300 mg by mouth at bedtime.   Yes [provider]  lisinopril (ZESTRIL) 2.5 MG tablet Take 1 tablet by mouth once daily 04/05/20  Yes End, Harrell Gave, MD  metoprolol succinate (TOPROL-XL) 25 MG 24 hr tablet Take 1/2 (one-half) tablet by mouth once daily 05/22/19  Yes End, Harrell Gave, MD  Misc Natural Products (SINUS FORMULA PO) Take by mouth as needed.    Yes [provider]  Multiple Vitamin (MULTIVITAMIN) tablet Take 1 tablet by mouth daily.   Yes [provider]  pantoprazole (PROTONIX) 40 MG tablet Take 1 tablet by mouth once daily 03/28/20  Yes Ria Bush, MD  Rivaroxaban (XARELTO) 15 MG TABS tablet Take 1 tablet (15 mg total) by mouth daily with  supper. 04/13/20  Yes End, Harrell Gave, MD  sertraline (ZOLOFT) 100 MG tablet Take 1 tablet by mouth once daily 04/13/20  Yes Ria Bush, MD  traMADol Veatrice Bourbon) 50 MG tablet Take 1 tablet by mouth three times daily as needed 08/23/19  Yes Ria Bush, MD  vitamin B-12 (CYANOCOBALAMIN) 500 MCG tablet Take 500 mcg by mouth daily.    Yes [provider]  clonazePAM (KLONOPIN) 1 MG tablet Take 1 tablet (1 mg total) by mouth at bedtime. Patient taking differently: Take 0.5 mg by mouth at bedtime.  03/30/20   Ria Bush, MD  gabapentin (NEURONTIN) 300 MG capsule TAKE ONE CAPSULE EVERY MORNING AND 2 CAPSULES AT BEDTIME Patient taking differently: at bedtime.  08/23/19   Ria Bush, MD    Family History    Family History  Problem Relation Age of Onset  . CAD Mother 30       MI  . Hypertension Mother   . Parkinson's disease Father   .  Diabetes Father   . CAD Father 89       MI  . Hyperlipidemia Sister   . Cancer Neg Hx   . Stroke Neg Hx   . Colon cancer Neg Hx   . Breast cancer Neg Hx   . Colon polyps Neg Hx   . Esophageal cancer Neg Hx   . Stomach cancer Neg Hx   . Rectal cancer Neg Hx    She indicated that her mother is deceased. She indicated that her father is deceased. She indicated that her sister is alive. She indicated that her brother is alive. She indicated that the status of her neg hx is unknown.  Social History    Social History   Socioeconomic History  . Marital status: Married    Spouse name: Not on file  . Number of children: Not on file  . Years of education: Not on file  . Highest education level: Not on file  Occupational History  . Not on file  Tobacco Use  . Smoking status: Former Smoker    Packs/day: 0.25    Years: 3.00    Pack years: 0.75    Types: Cigarettes    Quit date: 1990    Years since quitting: 31.8  . Smokeless tobacco: Never Used  . Tobacco comment: minimal smoking history  Vaping Use  . Vaping Use: Never used   Substance and Sexual Activity  . Alcohol use: No    Alcohol/week: 0.0 standard drinks  . Drug use: No  . Sexual activity: Never  Other Topics Concern  . Not on file  Social History Narrative   Lives with husband and 2 cats.  2 grown children, 5 grandchildren.   Occupation: retired, worked in [employer]   Activity: no regular exercise.  Does walk in summer   Diet: fruits/vegetables daily, good water.   Social Determinants of Health   Financial Resource Strain: Low Risk   . Difficulty of Paying Living Expenses: Not hard at all  Food Insecurity: No Food Insecurity  . Worried About Charity fundraiser in the Last Year: Never true  . Ran Out of Food in the Last Year: Never true  Transportation Needs: No Transportation Needs  . Lack of Transportation (Medical): No  . Lack of Transportation (Non-Medical): No  Physical Activity: Inactive  . Days of Exercise per Week: 0 days  . Minutes of Exercise per Session: 0 min  Stress: No Stress Concern Present  . Feeling of Stress : Not at all  Social Connections:   . Frequency of Communication with Friends and Family: Not on file  . Frequency of Social Gatherings with Friends and Family: Not on file  . Attends Religious Services: Not on file  . Active Member of Clubs or Organizations: Not on file  . Attends Archivist Meetings: Not on file  . Marital Status: Not on file  Intimate Partner Violence: Not At Risk  . Fear of Current or Ex-Partner: No  . Emotionally Abused: No  . Physically Abused: No  . Sexually Abused: No     Review of Systems    General:  No chills, fever, night sweats or weight changes.  Cardiovascular:  No chest pain, dyspnea on exertion, edema, orthopnea, palpitations, paroxysmal nocturnal dyspnea. Dermatological: No rash, lesions/masses Respiratory: No cough, dyspnea Urologic: No hematuria, dysuria Abdominal:   No nausea, vomiting, diarrhea, bright red blood per rectum, melena, or hematemesis Neurologic:  No  visual changes, wkns, changes in mental status. All  other systems reviewed and are otherwise negative except as noted above.  Physical Exam    VS:  BP 126/80 (BP Location: Left Arm, Patient Position: Sitting, Cuff Size: Normal)   Pulse 80   Ht 5\' 3"  (1.6 m)   Wt 137 lb (62.1 kg)   SpO2 95%   BMI 24.27 kg/m  , BMI Body mass index is 24.27 kg/m. GEN: Well nourished, well developed, in no acute distress. HEENT: normal. Neck: Supple, no JVD, carotid bruits, or masses. Cardiac: IR, IR, no murmurs, rubs, or gallops. No clubbing, cyanosis, edema.  Radials/PT 2+ and equal bilaterally.  Respiratory:  Respirations regular and unlabored, clear to auscultation bilaterally. GI: Soft, nontender, nondistended, BS + x 4. MS: no deformity or atrophy. Skin: warm and dry, no rash. Neuro:  Strength and sensation are intact. Psych: Normal affect.  Accessory Clinical Findings    ECG personally reviewed by me today-atrial fibrillation, 67, nonspecific T changes- No acute changes  Lab Results  Component Value Date   WBC 5.3 03/30/2020   HGB 10.7 (L) 03/30/2020   HCT 31.8 (L) 03/30/2020   MCV 87.6 03/30/2020   PLT 189 03/30/2020   Lab Results  Component Value Date   CREATININE 1.47 (H) 03/25/2020   BUN 32 (H) 03/25/2020   NA 139 03/25/2020   K 4.8 03/25/2020   CL 104 03/25/2020   CO2 28 03/25/2020   Lab Results  Component Value Date   ALT 10 03/25/2020   AST 21 03/25/2020   ALKPHOS 39 03/25/2020   BILITOT 0.4 03/25/2020   Lab Results  Component Value Date   CHOL 150 03/25/2020   HDL 47.30 03/25/2020   LDLCALC 65 03/25/2020   LDLDIRECT 132.0 03/18/2019   TRIG 191.0 (H) 03/25/2020   CHOLHDL 3 03/25/2020    Lab Results  Component Value Date   HGBA1C 6.3 03/25/2020    Assessment & Plan   1.  Persistent atrial fibrillation: This was newly diagnosed in September of this year.  She has been asymptomatic and well rate controlled on beta-blocker therapy.  CHA2DS2VASc equals 5 in the  setting of LV dysfunction, hypertension, age, moderate nonobstructive carotid arterial disease, and female gender.  She was placed on rivaroxaban 15 mg daily on September 29 and reports good compliance and tolerance.  Recent echo shows recurrence of LV dysfunction with an EF of 40-45%.  We discussed this today.  She wishes to pursue a cardioversion.  I will obtain labs today and we will schedule her for Tuesday, November 2 with Dr. Saunders Revel.  2.  Nonischemic cardiomyopathy/heart failure with midrange fraction: EF previously 35 to 40% in October 2018 with nonischemic stress test at that time.  She did have recovery of LV function but in the setting of atrial fibrillation, recent echo again shows mild LV dysfunction with an EF of 40-45%.  Fortunately, she is well compensated, asymptomatic, and euvolemic on examination.  She remains on beta-blocker and ACE inhibitor therapy.  Plan for cardioversion/rhythm management of atrial fibrillation with future reassessment of LV function once in sinus rhythm.  3.  Essential hypertension: Blood pressure stable today on beta-blocker and ACE inhibitor therapy.  4.  Hyperlipidemia: She is on Repatha in the setting of statin and Zetia intolerances.  LDL was 65 in September of this year.  5.  Carotid arterial disease: Moderate nonobstructive disease by ultrasound in October 2020.  She remains on Repatha therapy with LDL at goal.  6.  Stage III chronic kidney disease: Creatinine 1.47 on  September 10.  She is on lisinopril therapy.  Follow-up labs today in preparation for cardioversion.  7.  Disposition: Follow-up CBC and basic metabolic panel today.  Plan for Covid swab next Friday and cardioversion November 2.  Follow-up in clinic approximately 2 weeks post cardioversion.  Murray Hodgkins, NP 05/05/2020, 10:00 AM

## 2020-05-06 LAB — CBC WITH DIFFERENTIAL/PLATELET
Basophils Absolute: 0 10*3/uL (ref 0.0–0.2)
Basos: 1 %
EOS (ABSOLUTE): 0.2 10*3/uL (ref 0.0–0.4)
Eos: 4 %
Hematocrit: 33.3 % — ABNORMAL LOW (ref 34.0–46.6)
Hemoglobin: 10.7 g/dL — ABNORMAL LOW (ref 11.1–15.9)
Immature Grans (Abs): 0 10*3/uL (ref 0.0–0.1)
Immature Granulocytes: 0 %
Lymphocytes Absolute: 1.2 10*3/uL (ref 0.7–3.1)
Lymphs: 34 %
MCH: 28.5 pg (ref 26.6–33.0)
MCHC: 32.1 g/dL (ref 31.5–35.7)
MCV: 89 fL (ref 79–97)
Monocytes Absolute: 0.3 10*3/uL (ref 0.1–0.9)
Monocytes: 9 %
Neutrophils Absolute: 1.8 10*3/uL (ref 1.4–7.0)
Neutrophils: 52 %
Platelets: 174 10*3/uL (ref 150–450)
RBC: 3.75 x10E6/uL — ABNORMAL LOW (ref 3.77–5.28)
RDW: 14.5 % (ref 11.7–15.4)
WBC: 3.5 10*3/uL (ref 3.4–10.8)

## 2020-05-06 LAB — BASIC METABOLIC PANEL
BUN/Creatinine Ratio: 19 (ref 12–28)
BUN: 26 mg/dL (ref 8–27)
CO2: 21 mmol/L (ref 20–29)
Calcium: 10 mg/dL (ref 8.7–10.3)
Chloride: 103 mmol/L (ref 96–106)
Creatinine, Ser: 1.38 mg/dL — ABNORMAL HIGH (ref 0.57–1.00)
GFR calc Af Amer: 44 mL/min/{1.73_m2} — ABNORMAL LOW (ref >59)
GFR calc non Af Amer: 38 mL/min/{1.73_m2} — ABNORMAL LOW (ref >59)
Glucose: 96 mg/dL (ref 65–99)
Potassium: 4.9 mmol/L (ref 3.5–5.2)
Sodium: 140 mmol/L (ref 134–144)

## 2020-05-09 ENCOUNTER — Telehealth: Payer: Self-pay | Admitting: Internal Medicine

## 2020-05-09 NOTE — Telephone Encounter (Signed)
Patient calling in to discuss her future with repatha medication. Patient has one more to take today. Patient is curious if she should continue this medication because her patient assistance runs out this month  Please advise

## 2020-05-09 NOTE — Telephone Encounter (Signed)
[employer] grant is currently closed for re-enrollment and pt's grant has expired. Called pt back to let her know that yes we want her to continue on Repatha therapy. She states pharmacy quoted her $150 copay which is likely her donut hole pricing. She does have 1 Repatha infusion left and would just need 1 sample before her plan resets in 2022 and her copay goes back to Tier 3 pricing of  $47/month. We do not have any Repatha samples at our office but confirmed that the [facility] office does. Pt will plan to pick up 1 sample tomorrow before noon.

## 2020-05-13 ENCOUNTER — Other Ambulatory Visit
Admission: RE | Admit: 2020-05-13 | Discharge: 2020-05-13 | Disposition: A | Discharge status: Home / Self Care | Payer: Medicare HMO | Source: Ambulatory Visit | Attending: Nurse Practitioner | Admitting: Nurse Practitioner

## 2020-05-13 ENCOUNTER — Other Ambulatory Visit: Payer: Self-pay

## 2020-05-13 DIAGNOSIS — Z01812 Encounter for preprocedural laboratory examination: Secondary | ICD-10-CM | POA: Insufficient documentation

## 2020-05-13 DIAGNOSIS — Z20822 Contact with and (suspected) exposure to covid-19: Secondary | ICD-10-CM | POA: Insufficient documentation

## 2020-05-13 NOTE — Telephone Encounter (Signed)
Did patient pick this up?

## 2020-05-13 NOTE — Addendum Note (Signed)
Addended by: Raelene Bott, Yadhira Mckneely L on: 05/13/2020 03:46 PM   Modules accepted: Orders

## 2020-05-14 LAB — SARS CORONAVIRUS 2 (TAT 6-24 HRS): SARS Coronavirus 2: NEGATIVE

## 2020-05-17 ENCOUNTER — Encounter: Payer: Self-pay | Admitting: Internal Medicine

## 2020-05-17 ENCOUNTER — Telehealth: Payer: Self-pay

## 2020-05-17 ENCOUNTER — Encounter
Admission: RE | Disposition: A | Discharge status: Home / Self Care | Payer: Self-pay | Source: Home / Self Care | Attending: Internal Medicine

## 2020-05-17 ENCOUNTER — Ambulatory Visit: Payer: Medicare HMO | Admitting: Anesthesiology

## 2020-05-17 ENCOUNTER — Other Ambulatory Visit: Payer: Self-pay

## 2020-05-17 ENCOUNTER — Ambulatory Visit
Admission: RE | Admit: 2020-05-17 | Discharge: 2020-05-17 | Disposition: A | Discharge status: Home / Self Care | Payer: Medicare HMO | Attending: Internal Medicine | Admitting: Internal Medicine

## 2020-05-17 DIAGNOSIS — Z882 Allergy status to sulfonamides status: Secondary | ICD-10-CM | POA: Diagnosis not present

## 2020-05-17 DIAGNOSIS — N183 Chronic kidney disease, stage 3 unspecified: Secondary | ICD-10-CM | POA: Diagnosis not present

## 2020-05-17 DIAGNOSIS — Z91041 Radiographic dye allergy status: Secondary | ICD-10-CM | POA: Diagnosis not present

## 2020-05-17 DIAGNOSIS — Z87891 Personal history of nicotine dependence: Secondary | ICD-10-CM | POA: Diagnosis not present

## 2020-05-17 DIAGNOSIS — D631 Anemia in chronic kidney disease: Secondary | ICD-10-CM | POA: Diagnosis not present

## 2020-05-17 DIAGNOSIS — I4819 Other persistent atrial fibrillation: Secondary | ICD-10-CM

## 2020-05-17 DIAGNOSIS — E782 Mixed hyperlipidemia: Secondary | ICD-10-CM | POA: Diagnosis not present

## 2020-05-17 DIAGNOSIS — I4891 Unspecified atrial fibrillation: Secondary | ICD-10-CM | POA: Diagnosis not present

## 2020-05-17 DIAGNOSIS — I509 Heart failure, unspecified: Secondary | ICD-10-CM | POA: Diagnosis not present

## 2020-05-17 DIAGNOSIS — Z888 Allergy status to other drugs, medicaments and biological substances status: Secondary | ICD-10-CM | POA: Diagnosis not present

## 2020-05-17 DIAGNOSIS — I130 Hypertensive heart and chronic kidney disease with heart failure and stage 1 through stage 4 chronic kidney disease, or unspecified chronic kidney disease: Secondary | ICD-10-CM | POA: Diagnosis not present

## 2020-05-17 HISTORY — PX: CARDIOVERSION: SHX1299

## 2020-05-17 SURGERY — CARDIOVERSION
Anesthesia: General

## 2020-05-17 MED ORDER — PROPOFOL 10 MG/ML IV BOLUS
INTRAVENOUS | Status: AC
  Filled 2020-05-17: qty 20

## 2020-05-17 MED ORDER — PROPOFOL 10 MG/ML IV BOLUS
INTRAVENOUS | Status: DC | PRN
  Administered 2020-05-17: 60 mg via INTRAVENOUS

## 2020-05-17 MED ORDER — SODIUM CHLORIDE 0.9 % IV SOLN: INTRAVENOUS | Status: DC | PRN

## 2020-05-17 NOTE — Telephone Encounter (Signed)
Per Dr. Saunders Revel - Needs 15MG  Xarelto samples.  Provided Dr. Saunders Revel with 6 Bottles - per his request.   Dr. Saunders Revel is taking samples up to the patient as she is getting a Cardioversion.   Drug name: Xarelto       Strength: 15MG         Qty: 6 bottles  LOT: [id number]  Exp.Date: 09/2021    Janan Ridge 8:20 AM 05/17/2020

## 2020-05-17 NOTE — Anesthesia Preprocedure Evaluation (Addendum)
Anesthesia Evaluation  Patient identified by MRN, date of birth, ID band Patient awake    Reviewed: Allergy & Precautions, H&P , NPO status , Patient's Chart, lab work & pertinent test results  History of Anesthesia Complications Negative for: history of anesthetic complications  Airway Mallampati: II  TM Distance: >3 FB Neck ROM: full    Dental  (+) Teeth Intact   Pulmonary neg sleep apnea, neg COPD, former smoker,  Pulmonary fibrosis   breath sounds clear to auscultation       Cardiovascular hypertension, (-) angina+ CAD and +CHF  (-) Past MI and (-) Cardiac Stents + dysrhythmias Atrial Fibrillation  Rhythm:regular Rate:Normal  Non-ischemic cardiomyopathy  Echo 04/14/20: 1. Left ventricular ejection fraction, by estimation, is 40 to 45%. The  left ventricle has mild to moderately decreased function. The left  ventricle has no regional wall motion abnormalities. Indeterminate  diastolic filling due to E-A fusion.  2. Right ventricular systolic function is normal. The right ventricular  size is normal. There is normal pulmonary artery systolic pressure.  3. Left atrial size was moderately dilated.  4. Right atrial size was mild to moderately dilated.  5. The mitral valve is normal in structure. Trivial mitral valve  regurgitation. No evidence of mitral stenosis.  6. The aortic valve is normal in structure. Aortic valve regurgitation is  not visualized. No aortic stenosis is present.  7. The inferior vena cava is normal in size with greater than 50%  respiratory variability, suggesting right atrial pressure of 3 mmHg.    Neuro/Psych PSYCHIATRIC DISORDERS Anxiety Depression negative neurological ROS     GI/Hepatic Neg liver ROS, GERD  ,  Endo/Other  negative endocrine ROS  Renal/GU Renal disease (CKD)  negative genitourinary   Musculoskeletal   Abdominal   Peds  Hematology  (+) Blood dyscrasia, anemia ,    Anesthesia Other Findings Past Medical History: No date: Allergy No date: Anxiety No date: Arthritis No date: Carotid stenosis     Comment:  a. 02/2016 Carotid U/S: Bilateral 40-59%; b. 03/2017               Carotid U/S: RICA 11-91%, LICA 4-78%; c. 29/5621 Carotid               U/S: bilat 40-59% ICA dzs. No date: Cervical spondylosis     Comment:  s/p spine injections No date: CKD (chronic kidney disease), stage III ([facility]) No date: Depression 2016: Ectatic abdominal aorta ([facility])     Comment:  rpt [place] 5 yrs No date: GERD (gastroesophageal reflux disease) No date: History of chicken pox No date: History of diverticulitis of colon No date: HLD (hyperlipidemia) No date: HTN (hypertension) No date: Irritable bowel syndrome with constipation No date: Lichen sclerosus et atrophicus No date: NICM (nonischemic cardiomyopathy) ([place])     Comment:  a. 04/2017 Echo: EF 35-40%, diff HK; b. 05/2017 MV: EF               56%, no ischemia/infarct; c. 04/2018 Echo: EF 55-60%, no               rwma, Gr2 DD; d. 03/2020 Echo: EF 40-45%, nl PASP, mod dil              LA, mild to mod dil RA, Triv MR. No date: Obesity No date: Persistent atrial fibrillation ([place])     Comment:  a. Dx 03/2020. CHA2DS2VASc = 5-->xarelto 15mg  daily. No date: Premature atrial contraction     Comment:  a. 04/2017 48hr Holter: Predominant rhythm - sinus. Rare              PVC's, freq PAC's with freq atrial runs up to 34               beats-->metoprolol started. No date: Seasonal allergic rhinitis  Past Surgical History: 1990s: bladder tack 03/05/2012: BREAST BIOPSY; Right     Comment:   benign 2004, 2013: CARPAL TUNNEL RELEASE     Comment:  bilateral 1990s: CHOLECYSTECTOMY 2002: COLONOSCOPY 06/2013: COLONOSCOPY     Comment:  mod diverticulosis, 3 tubular adenomas, int hem rpt 5               yrs Fuller Plan) 06/2018: COLONOSCOPY     Comment:  TA,c olonic angiodysplastic lesion, mod diverticulosis,               rpt 5 yrs  Fuller Plan) 07/2011: DEXA     Comment:  normal, T score -0.3 2004: ESOPHAGOGASTRODUODENOSCOPY ENDOSCOPY     Comment:  normal Fuller Plan) 1990s: TOTAL ABDOMINAL HYSTERECTOMY W/ BILATERAL SALPINGOOPHORECTOMY     Comment:  complete, dysmenorrhea and fibroids     Reproductive/Obstetrics negative OB ROS                            Anesthesia Physical Anesthesia Plan  ASA: III  Anesthesia Plan: General   Post-op Pain Management:    Induction:   PONV Risk Score and Plan:   Airway Management Planned: Nasal Cannula  Additional Equipment:   Intra-op Plan:   Post-operative Plan:   Informed Consent: I have reviewed the patients History and Physical, chart, labs and discussed the procedure including the risks, benefits and alternatives for the proposed anesthesia with the patient or authorized representative who has indicated his/her understanding and acceptance.     Dental Advisory Given  Plan Discussed with: Anesthesiologist, CRNA and Surgeon  Anesthesia Plan Comments:         Anesthesia Quick Evaluation

## 2020-05-17 NOTE — Interval H&P Note (Signed)
History and Physical Interval Note:  05/17/2020 8:02 AM  [name]  has presented today for surgery, with the diagnosis of atrial fibrillation.  The various methods of treatment have been discussed with the patient and family. After consideration of risks, benefits and other options for treatment, the patient has consented to  Procedure(s): CARDIOVERSION (N/A) as a surgical intervention.  The patient's history has been reviewed, patient examined, no change in status, stable for surgery.  I have reviewed the patient's chart and labs.  Questions were answered to the patient's satisfaction.     Hadlyn Amero

## 2020-05-17 NOTE — Transfer of Care (Signed)
Immediate Anesthesia Transfer of Care Note  Patient: Ann White  Procedure(s) Performed: CARDIOVERSION (N/A )  Patient Location: Nursing Unit  Anesthesia Type:General  Level of Consciousness: drowsy and patient cooperative  Airway & Oxygen Therapy: Patient Spontanous Breathing and Patient connected to nasal cannula oxygen  Post-op Assessment: Report given to RN and Post -op Vital signs reviewed and stable  Post vital signs: Reviewed and stable  Last Vitals:  Vitals Value Taken Time  BP 93/60 05/17/20 0828  Temp    Pulse 42 05/17/20 0830  Resp 17 05/17/20 0830  SpO2 100 % 05/17/20 0830  Vitals shown include unvalidated device data.  Last Pain:  Vitals:   05/17/20 0805  TempSrc: Oral         Complications: No complications documented.

## 2020-05-17 NOTE — CV Procedure (Signed)
    Cardioversion Note  Ann White [id number] 1945-08-05  Procedure: DC Cardioversion Indications: Persistent atrial fibrillation  Procedure Details Consent: Obtained Time Out: Verified patient identification, verified procedure, site/side was marked, verified correct patient position, special equipment/implants available, Radiology Safety Procedures followed,  medications/allergies/relevent history reviewed, required imaging and test results available.  Performed  The patient has been on adequate anticoagulation.  The patient received IV propofol by anesthesia for sedation.  Synchronous cardioversion was performed at 200 joules x 1.  The cardioversion was successful with restoration of sinus bradycardia.  Complications: No apparent complications Patient did tolerate procedure well.  Metoprolol will be discontinued due to sinus bradycardia post-cardioversion.  Nelva Bush., MD 05/17/2020, 8:30 AM

## 2020-05-18 ENCOUNTER — Encounter: Payer: Self-pay | Admitting: Internal Medicine

## 2020-05-18 NOTE — Anesthesia Postprocedure Evaluation (Signed)
Anesthesia Post Note  Patient: Ann White  Procedure(s) Performed: CARDIOVERSION (N/A )  Patient location during evaluation: PACU Anesthesia Type: General Level of consciousness: awake and alert Pain management: pain level controlled Vital Signs Assessment: post-procedure vital signs reviewed and stable Respiratory status: spontaneous breathing, nonlabored ventilation and respiratory function stable Cardiovascular status: blood pressure returned to baseline and stable Postop Assessment: no apparent nausea or vomiting Anesthetic complications: no   No complications documented.   Last Vitals:  Vitals:   05/17/20 0835 05/17/20 0900  BP: (!) 99/56 (!) 98/59  Pulse: (!) 35 (!) 43  Resp: 20 19  SpO2: 100% 97%    Last Pain:  Vitals:   05/17/20 0805  TempSrc: Oral                 Tera Mater

## 2020-05-25 ENCOUNTER — Ambulatory Visit: Payer: Medicare HMO | Admitting: Internal Medicine

## 2020-05-30 NOTE — Progress Notes (Signed)
Follow-up Outpatient Visit Date: 06/01/2020  Primary Care Provider: Ria Bush, MD [place] [address]  Chief Complaint: Follow-up atrial fibrillation  HPI:  Ann White is a 74 y.o. female with history of non-ischemic cardiomyopathy diagnosed in 04/2017 (MPI without perfusion defects LVEF as low as 35-40% but normalized on repeat echo in 04/2018), persistent atrial fibrillation diagnosed in 03/2020, carotid artery stenosis, abdominal aorta ectasia, right calf DVT, hypertension, hyperlipidemia, and chronic kidney disease, who presents for follow-up of atrial fibrillation and cardiomyopathy.  Ann White underwent cardioversion 2 weeks ago for atrial fibrillation diagnosed in September.  Ann White, Ann White reports that Ann White is feeling well.  Ann White had some mild soreness in Ann chest wall following cardioversion, which has resolved.  Ann White denies chest pain, shortness of breath, palpitations, lightheadedness, edema, and bleeding.  Ann White remains compliant with Ann medications, including rivaroxaban.  However, Ann White is very concerned about the cost of rivaroxaban and does not think that Ann White will be able to afford it long-term when Ann White is in the "donut hole."  --------------------------------------------------------------------------------------------------  Cardiovascular History & Procedures: Cardiovascular Problems:  Persistent atrial fibrillation  Nonischemic cardiomyopathy  Carotid artery stenosis  Risk Factors:  Hypertension, hyperlipidemia, and age greater than 22  Cath/PCI:  None  CV Surgery:  None  EP Procedures and Devices:  DCCV (05/17/20)  48 hour Holter monitor (04/29/17): Sinus rhythm with frequent PACs and atrial runs, lasting up to 34 beats.  Non-Invasive Evaluation(s):  Carotid Doppler (04/17/2019): 40-59% stenosis in the carotid arteries bilaterally. Antegrade flow in the vertebral arteries. Normal subclavian flow.  TTE (04/24/18): Normal LV  size. LVEF 55-60% with normal wall motion. Grade 2 diastolic dysfunction. Mild left atrial enlargement. Normal RV size and function. Mild pulmonary hypertension (PASP 41 mmHg).  Exercise MPI (06/12/17): No evidence of ischemia. LVEF 56%. Hypertensive blood pressure response to exercise.  Echo (04/29/17): Normal LV size with moderately reduced contraction. LVEF 35-40% with global hypokinesis. Mild MR. Mild left atrial enlargement. Normal RV size and function. Normal PA pressure.  Carotid Doppler (03/25/17):Heterogeneous plaque, bilaterally. Stable, 40-59% RICA stenosis. KYHCWC,3-76% LICA stenosis. Stable, >50% RECA stenosis. Patent vertebral arteries with antegrade flow. Normal subclavian arteries, bilaterally.  Recent CV Pertinent Labs: Lab Results  Component Value Date   CHOL 150 03/25/2020   CHOL 191 09/14/2019   HDL 47.30 03/25/2020   HDL 59 09/14/2019   LDLCALC 65 03/25/2020   LDLCALC 107 (H) 09/14/2019   LDLDIRECT 132.0 03/18/2019   TRIG 191.0 (H) 03/25/2020   CHOLHDL 3 03/25/2020   INR 1.5 09/22/2015   INR 7.0 Repeated and verified X2. (HH) 06/13/2015   K 4.9 05/05/2020   MG 2.2 04/17/2017   BUN 26 05/05/2020   CREATININE 1.38 (H) 05/05/2020   CREATININE 1.19 07/26/2011    Past medical and surgical history were reviewed and updated in EPIC.  Current Meds  Medication Sig  . acetaminophen (TYLENOL) 500 MG tablet Take 500-1,000 mg by mouth every 6 (six) hours as needed (knee pain.).  Marland Kitchen cetirizine (ZYRTEC) 10 MG tablet Take 10 mg by mouth daily.   . Cholecalciferol (VITAMIN D3) 1000 UNITS CAPS Take 1 capsule (1,000 Units total) by mouth daily. (Patient taking differently: Take 1,000 Units by mouth every evening. )  . clonazePAM (KLONOPIN) 1 MG tablet Take 0.5 mg by mouth at bedtime.  . docusate sodium (COLACE) 100 MG capsule Take 100-200 mg by mouth daily as needed (constipation.).  Marland Kitchen Evolocumab with Infusor (Warren) 420 MG/3.5ML SOCT Inject  420 mg into  the skin every 30 (thirty) days.  Marland Kitchen gabapentin (NEURONTIN) 300 MG capsule Take 300 mg by mouth at bedtime.  Marland Kitchen lisinopril (ZESTRIL) 2.5 MG tablet Take 1 tablet by mouth once daily (Patient taking differently: Take 2.5 mg by mouth daily. )  . Multiple Vitamin (MULTIVITAMIN WITH MINERALS) TABS tablet Take 1 tablet by mouth daily.  Marland Kitchen oxymetazoline (AFRIN) 0.05 % nasal spray Place 1 spray into both nostrils 2 (two) times daily as needed for congestion.  . pantoprazole (PROTONIX) 40 MG tablet Take 1 tablet by mouth once daily (Patient taking differently: Take 40 mg by mouth daily before breakfast. )  . Rivaroxaban (XARELTO) 15 MG TABS tablet Take 1 tablet (15 mg total) by mouth daily with supper.  . sertraline (ZOLOFT) 100 MG tablet Take 1 tablet by mouth once daily (Patient taking differently: Take 100 mg by mouth daily. )  . traMADol (ULTRAM) 50 MG tablet Take 1 tablet by mouth three times daily as needed (Patient taking differently: Take 50 mg by mouth 3 (three) times daily as needed (knee pain.). )  . vitamin B-12 (CYANOCOBALAMIN) 500 MCG tablet Take 500 mcg by mouth daily.     Allergies: Sulfa antibiotics, Nexletol [bempedoic acid], Tricor [fenofibrate], Zetia [ezetimibe], Contrast media [iodinated diagnostic agents], Lipitor [atorvastatin], and Pravastatin  Social History   Tobacco Use  . Smoking status: Former Smoker    Packs/day: 0.25    Years: 3.00    Pack years: 0.75    Types: Cigarettes    Quit date: 1990    Years since quitting: 31.8  . Smokeless tobacco: Never Used  . Tobacco comment: minimal smoking history  Vaping Use  . Vaping Use: Never used  Substance Use Topics  . Alcohol use: No    Alcohol/week: 0.0 standard drinks  . Drug use: No    Family History  Problem Relation Age of Onset  . CAD Mother 49       MI  . Hypertension Mother   . Parkinson's disease Father   . Diabetes Father   . CAD Father 71       MI  . Hyperlipidemia Sister   . Cancer Neg Hx   . Stroke  Neg Hx   . Colon cancer Neg Hx   . Breast cancer Neg Hx   . Colon polyps Neg Hx   . Esophageal cancer Neg Hx   . Stomach cancer Neg Hx   . Rectal cancer Neg Hx     Review of Systems: A 12-system review of systems was performed and was negative except as noted in the HPI.  --------------------------------------------------------------------------------------------------  Physical Exam: BP 128/70 (BP Location: Left Arm, Patient Position: Sitting, Cuff Size: Normal)   Pulse 91   Ht 5\' 3"  (1.6 m)   Wt 137 lb (62.1 kg)   SpO2 98%   BMI 24.27 kg/m   General: NAD.  Accompanied by Ann White. HEENT: No conjunctival pallor or scleral icterus. Facemask in place. Neck: Supple without lymphadenopathy, thyromegaly, JVD, or HJR. Lungs: Normal work of breathing. Clear to auscultation bilaterally without wheezes or crackles. Heart: Irregularly irregular rhythm without murmurs, rubs, or gallops. Abd: Bowel sounds present. Soft, NT/ND without hepatosplenomegaly Ext: No lower extremity edema.  EKG: Atrial fibrillation with low voltage and poor R wave progression.  Tracing on 05/17/2020, atrial fibrillation has replaced sinus bradycardia with PACs.  Lab Results  Component Value Date   WBC 3.5 05/05/2020   HGB 10.7 (L) 05/05/2020   HCT 33.3 (  L) 05/05/2020   MCV 89 05/05/2020   PLT 174 05/05/2020    Lab Results  Component Value Date   NA 140 05/05/2020   K 4.9 05/05/2020   CL 103 05/05/2020   CO2 21 05/05/2020   BUN 26 05/05/2020   CREATININE 1.38 (H) 05/05/2020   GLUCOSE 96 05/05/2020   ALT 10 03/25/2020    Lab Results  Component Value Date   CHOL 150 03/25/2020   HDL 47.30 03/25/2020   LDLCALC 65 03/25/2020   LDLDIRECT 132.0 03/18/2019   TRIG 191.0 (H) 03/25/2020   CHOLHDL 3 03/25/2020    --------------------------------------------------------------------------------------------------  ASSESSMENT AND PLAN: Persistent atrial fibrillation: Ann White feels well without  palpitations, chest pain, or shortness of breath.  Unfortunately, Ann White has gone back into atrial fibrillation following Ann successful cardioversion just over 2 weeks ago.  Ventricular rate is in the 90s Ann White.  I have recommended reinitiation of metoprolol succinate 12.5 mg daily as well as continuation of rivaroxaban.  We discussed alternative anticoagulation options given Ann cost concerns.  Though Ann White was initially hesitant to consider warfarin due to a family members experiences in the past, Ann White would be willing to try this.  Ann White has enough rivaroxaban to carry Ann through the Amair White of this calendar year, at which time Ann Medicare prescription coverage should reset and may be more affordable.  We discussed rate and rhythm control strategies.  Given Ann underlying cardiomyopathy, I think it would be beneficial for Ann to maintain sinus rhythm, if possible.  We will refer Ann to electrophysiology to discuss antiarrhythmic and ablative options.  Cardiomyopathy: Presumed to be nonischemic in the past based on low risk Myoview.  However, Ann White has risk factors for coronary artery disease, including known cerebrovascular disease.  Though Ann White is asymptomatic, I have recommended consideration of repeat ischemia evaluation (I would favor catheterization).  Ann White is somewhat reluctant to proceed at this time.  We have agreed to defer for now pending EP consultation.  We will restart metoprolol succinate 12.5 mg daily.  We will also continue lisinopril 2.5 mg daily.  Hyperlipidemia: LDL well controlled with Repatha (goal less than 70).  Carotid artery stenosis: No new neurologic symptoms reported.  Continue rivaroxaban in lieu of aspirin as well as aggressive lipid control.  Follow-up: Return to clinic in 1 month.  Nelva Bush, MD 06/01/2020 11:56 AM

## 2020-06-01 ENCOUNTER — Encounter: Payer: Self-pay | Admitting: Internal Medicine

## 2020-06-01 ENCOUNTER — Other Ambulatory Visit: Payer: Self-pay

## 2020-06-01 ENCOUNTER — Ambulatory Visit: Payer: Medicare HMO | Admitting: Internal Medicine

## 2020-06-01 VITALS — BP 128/70 | HR 91 | Ht 63.0 in | Wt 137.0 lb

## 2020-06-01 DIAGNOSIS — G720 Drug-induced myopathy: Secondary | ICD-10-CM

## 2020-06-01 DIAGNOSIS — E785 Hyperlipidemia, unspecified: Secondary | ICD-10-CM | POA: Diagnosis not present

## 2020-06-01 DIAGNOSIS — I4819 Other persistent atrial fibrillation: Secondary | ICD-10-CM | POA: Diagnosis not present

## 2020-06-01 DIAGNOSIS — I428 Other cardiomyopathies: Secondary | ICD-10-CM

## 2020-06-01 DIAGNOSIS — I6523 Occlusion and stenosis of bilateral carotid arteries: Secondary | ICD-10-CM

## 2020-06-01 DIAGNOSIS — T466X5A Adverse effect of antihyperlipidemic and antiarteriosclerotic drugs, initial encounter: Secondary | ICD-10-CM

## 2020-06-01 MED ORDER — METOPROLOL SUCCINATE ER 25 MG PO TB24: 12.5000 mg | ORAL_TABLET | Freq: Every day | ORAL | 1 refills | Status: DC

## 2020-06-01 NOTE — Patient Instructions (Signed)
Medication Instructions:  Your physician has recommended you make the following change in your medication:  1- START Metoprolol succinate 12.5 mg (0.5 tablet) by mouth once a day.  *If you need a refill on your cardiac medications before your next appointment, please call your pharmacy*  Follow-Up: At [facility], you and your health needs are our priority.  As part of our continuing mission to provide you with exceptional heart care, we have created designated Provider Care Teams.  These Care Teams include your primary Cardiologist (physician) and Advanced Practice Providers (APPs -  Physician Assistants and Nurse Practitioners) who all work together to provide you with the care you need, when you need it.  We recommend signing up for the patient portal called "MyChart".  Sign up information is provided on this After Visit Summary.  MyChart is used to connect with patients for Virtual Visits (Telemedicine).  Patients are able to view lab/test results, encounter notes, upcoming appointments, etc.  Non-urgent messages can be sent to your provider as well.   To learn more about what you can do with MyChart, go to [email or web address].    Your next appointment:   1 month(s) with Dr End or APP (ideally patient should see Dr Quentin Ore sometime before)  The format for your next appointment:   In Person  Provider:   You may see Nelva Bush, MD or one of the following Advanced Practice Providers on your designated Care Team:    Murray Hodgkins, NP  Christell Faith, PA-C  Marrianne Mood, PA-C  Cadence Pierce, [place]  Laurann [place], NP    Other Instructions You have been referred to Electrophysiology with Dr Quentin Ore.

## 2020-06-02 ENCOUNTER — Encounter: Payer: Self-pay | Admitting: Internal Medicine

## 2020-06-02 ENCOUNTER — Telehealth: Payer: Self-pay | Admitting: Internal Medicine

## 2020-06-02 NOTE — Telephone Encounter (Signed)
Patient wanted to make sure it was ok for her to take a trip to [facility] for 3 days in December. Advised her that should be fine. She was very relieved as she goes with these 2 friends every year.

## 2020-06-02 NOTE — Telephone Encounter (Signed)
Patient calling  Needs to clarify information from appointment yesterday Please call to discuss

## 2020-06-06 ENCOUNTER — Other Ambulatory Visit: Payer: Self-pay | Admitting: Family Medicine

## 2020-06-06 NOTE — Telephone Encounter (Signed)
ERx 

## 2020-06-13 ENCOUNTER — Telehealth: Payer: Self-pay | Admitting: Family Medicine

## 2020-06-13 NOTE — Telephone Encounter (Signed)
Pt called in wanted to set up an appointment for Cortizone injection in her right knee she has arthritis in the knee.

## 2020-06-13 NOTE — Telephone Encounter (Signed)
Patient called in and scheduled visit with Dr.Copland

## 2020-06-13 NOTE — Telephone Encounter (Signed)
Please schedule appointment with Dr. Lorelei Pont.

## 2020-06-15 ENCOUNTER — Other Ambulatory Visit: Payer: Self-pay

## 2020-06-15 ENCOUNTER — Ambulatory Visit (INDEPENDENT_AMBULATORY_CARE_PROVIDER_SITE_OTHER): Payer: Medicare HMO | Admitting: Family Medicine

## 2020-06-15 ENCOUNTER — Encounter: Payer: Self-pay | Admitting: Family Medicine

## 2020-06-15 VITALS — BP 90/70 | HR 99 | Temp 97.8°F | Ht 63.0 in | Wt 136.0 lb

## 2020-06-15 DIAGNOSIS — M1711 Unilateral primary osteoarthritis, right knee: Secondary | ICD-10-CM | POA: Diagnosis not present

## 2020-06-15 MED ORDER — TRIAMCINOLONE ACETONIDE 40 MG/ML IJ SUSP
40.0000 mg | Freq: Once | INTRAMUSCULAR | Status: AC
  Administered 2020-06-15: 40 mg via INTRA_ARTICULAR

## 2020-06-15 NOTE — Progress Notes (Signed)
    Ann White T. Kraven Calk, MD, [facility]  Primary Care and Sports Medicine [facility] [place], [address]  Phone: [phone]  FAX: [place] - 74 y.o. female  MRN [id number]  Date of Birth: 1946-02-20  Date: 06/15/2020  PCP: Ria Bush, MD  Referral: Ria Bush, MD  Chief Complaint  Patient presents with  . Knee Pain    Right    This visit occurred during the SARS-CoV-2 public health emergency.  Safety protocols were in place, including screening questions prior to the visit, additional usage of staff PPE, and extensive cleaning of exam room while observing appropriate contact time as indicated for disinfecting solutions.    Last time I injected her knee she did have some notable tricompartment osteoarthritis, and she is here today for an acute flareup.    Procedure only:  Aspiration/Injection Procedure Note Ann White 1946-03-23 Date of procedure: 06/15/2020  Procedure: Large Joint Aspiration / Injection of Knee, R Indications: Pain  Procedure Details Patient verbally consented to procedure. Risks, benefits, and alternatives explained. Sterilely prepped with Chloraprep. Ethyl cholride used for anesthesia. 9 cc Lidocaine 1% mixed with 1 mL of Kenalog 40 mg injected using the anteromedial approach without difficulty. No complications with procedure and tolerated well. Patient had decreased pain post-injection. Medication: 1 mL of Kenalog 40 mg  Signed,  Odeal Welden T. Lurline Caver, MD

## 2020-06-15 NOTE — Addendum Note (Signed)
Addended by: Carter Kitten on: 06/15/2020 10:54 AM   Modules accepted: Orders

## 2020-06-21 DIAGNOSIS — G720 Drug-induced myopathy: Secondary | ICD-10-CM | POA: Insufficient documentation

## 2020-06-21 DIAGNOSIS — T466X5A Adverse effect of antihyperlipidemic and antiarteriosclerotic drugs, initial encounter: Secondary | ICD-10-CM | POA: Insufficient documentation

## 2020-07-04 ENCOUNTER — Other Ambulatory Visit: Payer: Self-pay | Admitting: Family Medicine

## 2020-07-06 ENCOUNTER — Other Ambulatory Visit: Payer: Self-pay

## 2020-07-06 ENCOUNTER — Ambulatory Visit: Payer: Medicare HMO | Admitting: Cardiology

## 2020-07-06 ENCOUNTER — Encounter: Payer: Self-pay | Admitting: Cardiology

## 2020-07-06 ENCOUNTER — Telehealth: Payer: Self-pay | Admitting: Cardiology

## 2020-07-06 VITALS — BP 122/68 | HR 100 | Ht 63.0 in | Wt 137.0 lb

## 2020-07-06 DIAGNOSIS — I10 Essential (primary) hypertension: Secondary | ICD-10-CM | POA: Diagnosis not present

## 2020-07-06 DIAGNOSIS — I4819 Other persistent atrial fibrillation: Secondary | ICD-10-CM | POA: Diagnosis not present

## 2020-07-06 DIAGNOSIS — E782 Mixed hyperlipidemia: Secondary | ICD-10-CM

## 2020-07-06 DIAGNOSIS — I428 Other cardiomyopathies: Secondary | ICD-10-CM | POA: Diagnosis not present

## 2020-07-06 MED ORDER — APIXABAN 5 MG PO TABS: 5.0000 mg | ORAL_TABLET | Freq: Two times a day (BID) | ORAL | 6 refills | Status: DC

## 2020-07-06 NOTE — Telephone Encounter (Signed)
I called and spoke with the patient. She states she called her insurance about Tikosyn & checked with [facility] about the cost of her Eliquis.  She was told: 1) Tikosyn is going to be $100/ month - I asked if this was the brand or generic price quoted to her - she advised this was for Tikosyn - she asked about Dofetilide and was told they didn't even have that on file for her insurance  2) Eliquis is going to be $130/ month for her - she was given a weeks worth of samples today - she was also given a Free 30-day trial card, which she used at the pharmacy today  I have advised her I will forward this message to Dr. Quentin Ore and advise him of the above.   She was not due to be scheduled to go in for Tikosyn until January.

## 2020-07-06 NOTE — Telephone Encounter (Signed)
Attempted to reach Pt to clarify she is referring to dofetilide vs Eliquis.  Was unable to reach Pt.   Will forward to afib clinic to follow up on dofetilide admission.

## 2020-07-06 NOTE — Progress Notes (Signed)
Electrophysiology Office Note:    Date:  07/06/2020   ID:  Ann White, [name] Dec 12, 1945, MRN [id number]  PCP:  Ria Bush, MD  [facility] Cardiologist:  Nelva Bush, MD  [facility] Electrophysiologist:  None   Referring MD: Nelva Bush, MD   Chief Complaint: Atrial fibrillation and nonischemic cardiomyopathy  History of Present Illness:    Ann White is a 74 y.o. female who presents for an evaluation of atrial fibrillation and nonischemic cardiomyopathy at the request of Dr. Saunders Revel. Their medical history includes persistent atrial fibrillation, hypertension, hyperlipidemia, nonischemic cardiomyopathy with a recent ejection fraction of 40%, ectatic abdominal aorta.  She was last seen by Dr. Saunders Revel on June 01, 2020.  She was diagnosed with atrial fibrillation in September of this year.  She underwent a cardioversion on May 17, 2020 but unfortunately went back in atrial fibrillation within 2 weeks of the cardioversion.  She cannot tell when she is in atrial fibrillation.  She says she has a good exercise tolerance.  No syncope or presyncope.  No shortness of breath with her normal activities.  She says sometimes when she lays down at night she can feel her heart beating a little faster but it does not particularly bother her.  She is currently on Xarelto for stroke prophylaxis but this is a very expensive medication for her and she is interested in changing to another NOAC.  She would like to avoid Coumadin if at all possible due to a family member who passed away from a severe bleeding episode while taking Coumadin.  Past Medical History:  Diagnosis Date  . Allergy   . Anxiety   . Arthritis   . Carotid stenosis    a. 02/2016 Carotid U/S: Bilateral 40-59%; b. 03/2017 Carotid U/S: RICA [id number], LICA 123456; c. [id number] Carotid U/S: bilat 40-59% ICA dzs.  . Cervical spondylosis    s/p spine injections  . CKD (chronic kidney disease), stage III ([place])   .  Depression   . Ectatic abdominal aorta ([place]) 2016   rpt [place] 5 yrs  . GERD (gastroesophageal reflux disease)   . History of chicken pox   . History of diverticulitis of colon   . HLD (hyperlipidemia)   . HTN (hypertension)   . Irritable bowel syndrome with constipation   . Lichen sclerosus et atrophicus   . NICM (nonischemic cardiomyopathy) ([place])    a. 04/2017 Echo: EF 35-40%, diff HK; b. 05/2017 MV: EF 56%, no ischemia/infarct; c. 04/2018 Echo: EF 55-60%, no rwma, Gr2 DD; d. 03/2020 Echo: EF 40-45%, nl PASP, mod dil LA, mild to mod dil RA, Triv MR.  . Obesity   . Persistent atrial fibrillation ([place])    a. Dx 03/2020. CHA2DS2VASc = 5-->xarelto 15mg  daily.  . Premature atrial contraction    a. 04/2017 48hr Holter: Predominant rhythm - sinus. Rare PVC's, freq PAC's with freq atrial runs up to 34 beats-->metoprolol started.  . Seasonal allergic rhinitis     Past Surgical History:  Procedure Laterality Date  . bladder tack  1990s  . BREAST BIOPSY Right 03/05/2012    benign  . CARDIOVERSION N/A 05/17/2020   Procedure: CARDIOVERSION;  Surgeon: Nelva Bush, MD;  Location: [facility];  Service: Cardiovascular;  Laterality: N/A;  . CARPAL TUNNEL RELEASE  2004, 2013   bilateral  . CHOLECYSTECTOMY  1990s  . COLONOSCOPY  2002  . COLONOSCOPY  06/2013   mod diverticulosis, 3 tubular adenomas, int hem rpt 5 yrs [facility])  . COLONOSCOPY  06/2018   TA,c olonic angiodysplastic lesion, mod diverticulosis, rpt 5 yrs [facility])  . DEXA  07/2011   normal, T score -0.3  . ESOPHAGOGASTRODUODENOSCOPY ENDOSCOPY  2004   normal [facility])  . TOTAL ABDOMINAL HYSTERECTOMY W/ BILATERAL SALPINGOOPHORECTOMY  1990s   complete, dysmenorrhea and fibroids    Current Medications: Current Meds  Medication Sig  . acetaminophen (TYLENOL) 500 MG tablet Take 500-1,000 mg by mouth every 6 (six) hours as needed (knee pain.).  Marland Kitchen cetirizine (ZYRTEC) 10 MG tablet Take 10 mg by mouth daily.   . Cholecalciferol (VITAMIN D3) 1000  UNITS CAPS Take 1 capsule (1,000 Units total) by mouth daily.  . clonazePAM (KLONOPIN) 1 MG tablet Take 0.5 mg by mouth at bedtime.  . docusate sodium (COLACE) 100 MG capsule Take 100-200 mg by mouth daily as needed (constipation.).  Marland Kitchen Evolocumab with Infusor ([place]) 420 MG/3.5ML SOCT Inject 420 mg into the skin every 30 (thirty) days.  Marland Kitchen gabapentin (NEURONTIN) 300 MG capsule Take 300 mg by mouth at bedtime.  Marland Kitchen lisinopril (ZESTRIL) 2.5 MG tablet Take 1 tablet by mouth once daily  . metoprolol succinate (TOPROL XL) 25 MG 24 hr tablet Take 0.5 tablets (12.5 mg total) by mouth daily.  . Multiple Vitamin (MULTIVITAMIN WITH MINERALS) TABS tablet Take 1 tablet by mouth daily.  Marland Kitchen oxymetazoline (AFRIN) 0.05 % nasal spray Place 1 spray into both nostrils 2 (two) times daily as needed for congestion.  . pantoprazole (PROTONIX) 40 MG tablet Take 1 tablet by mouth once daily  . Rivaroxaban (XARELTO) 15 MG TABS tablet Take 1 tablet (15 mg total) by mouth daily with supper.  . sertraline (ZOLOFT) 100 MG tablet Take 1 tablet by mouth once daily  . traMADol (ULTRAM) 50 MG tablet Take 1 tablet (50 mg total) by mouth 3 (three) times daily as needed (knee pain.).  Marland Kitchen vitamin B-12 (CYANOCOBALAMIN) 500 MCG tablet Take 500 mcg by mouth daily.      Allergies:   Sulfa antibiotics, Nexletol [bempedoic acid], Tricor [fenofibrate], Zetia [ezetimibe], Contrast media [iodinated diagnostic agents], Lipitor [atorvastatin], and Pravastatin   Social History   Socioeconomic History  . Marital status: Married    Spouse name: Ann White   . Number of children: 2  . Years of education: Not on file  . Highest education level: Not on file  Occupational History  . Occupation: retired   Tobacco Use  . Smoking status: Former Smoker    Packs/day: 0.25    Years: 3.00    Pack years: 0.75    Types: Cigarettes    Quit date: 1990    Years since quitting: 31.9  . Smokeless tobacco: Never Used  . Tobacco comment:  minimal smoking history  Vaping Use  . Vaping Use: Never used  Substance and Sexual Activity  . Alcohol use: No    Alcohol/week: 0.0 standard drinks  . Drug use: No  . Sexual activity: Never  Other Topics Concern  . Not on file  Social History Narrative   Lives with husband and 2 cats.  2 grown children, 5 grandchildren.   Occupation: retired, worked in [employer]   Activity: no regular exercise.  Does walk in summer   Diet: fruits/vegetables daily, good water.   Social Determinants of Health   Financial Resource Strain: Low Risk   . Difficulty of Paying Living Expenses: Not hard at all  Food Insecurity: No Food Insecurity  . Worried About Charity fundraiser in the Last Year: Never true  .  Ran Out of Food in the Last Year: Never true  Transportation Needs: No Transportation Needs  . Lack of Transportation (Medical): No  . Lack of Transportation (Non-Medical): No  Physical Activity: Inactive  . Days of Exercise per Week: 0 days  . Minutes of Exercise per Session: 0 min  Stress: No Stress Concern Present  . Feeling of Stress : Not at all  Social Connections: Not on file     Family History: The patient's family history includes CAD (age of onset: 17) in her father; CAD (age of onset: 35) in her mother; Diabetes in her father; Hyperlipidemia in her sister; Hypertension in her mother; Parkinson's disease in her father. There is no history of Cancer, Stroke, Colon cancer, Breast cancer, Colon polyps, Esophageal cancer, Stomach cancer, or Rectal cancer.  ROS:   Please see the history of present illness.    All other systems reviewed and are negative.  EKGs/Labs/Other Studies Reviewed:    The following studies were reviewed today: Prior EKGs, echo, notes  April 14, 2020 echo personally reviewed Left ventricular function moderately decreased, 40%. Global left ventricular dysfunction Normal right ventricular function Moderate to severe left atrial dilation Moderate to severe  right atrial dilation No significant valvular abnormalities  April 24, 2018 echo Left ventricular function normal 55 to 60%  April 29, 2017 echo Left ventricular function moderately decreased, 35 to 40% Global left ventricular dysfunction  EKG:  The ekg ordered today demonstrates coarse atrial fibrillation with a ventricular rate of 100 bpm.  QTC on today's EKG is approximately 420 ms  Recent Labs: 03/25/2020: ALT 10 03/30/2020: TSH 1.40 05/05/2020: BUN 26; Creatinine, Ser 1.38; Hemoglobin 10.7; Platelets 174; Potassium 4.9; Sodium 140  Recent Lipid Panel    Component Value Date/Time   CHOL 150 03/25/2020 0838   CHOL 191 09/14/2019 1019   TRIG 191.0 (H) 03/25/2020 0838   HDL 47.30 03/25/2020 0838   HDL 59 09/14/2019 1019   CHOLHDL 3 03/25/2020 0838   VLDL 38.2 03/25/2020 0838   LDLCALC 65 03/25/2020 0838   LDLCALC 107 (H) 09/14/2019 1019   LDLDIRECT 132.0 03/18/2019 0916    Physical Exam:    VS:  BP 122/68   Pulse 100   Ht 5\' 3"  (1.6 m)   Wt 137 lb (62.1 kg)   SpO2 99%   BMI 24.27 kg/m     Wt Readings from Last 3 Encounters:  07/06/20 137 lb (62.1 kg)  06/15/20 136 lb (61.7 kg)  06/01/20 137 lb (62.1 kg)     GEN: Well nourished, well developed in no acute distress HEENT: Normal NECK: No JVD; No carotid bruits LYMPHATICS: No lymphadenopathy CARDIAC: Irregularly irregular, no murmurs, rubs, gallops RESPIRATORY:  Clear to auscultation without rales, wheezing or rhonchi  ABDOMEN: Soft, non-tender, non-distended MUSCULOSKELETAL:  No edema; No deformity  SKIN: Warm and dry NEUROLOGIC:  Alert and oriented x 3 PSYCHIATRIC:  Normal affect   ASSESSMENT:    1. Persistent atrial fibrillation ([place])   2. NICM (nonischemic cardiomyopathy) ([place])   3. Essential hypertension   4. Mixed hyperlipidemia    PLAN:    In order of problems listed above:  1. Persistent atrial fibrillation Patient is relatively asymptomatic with her atrial fibrillation but unfortunately  she has a nonischemic cardiomyopathy with an ejection fraction of approximately 40 to 45%.  There is global hypokinesis.  She has had a prior stress test in 2018 which was not suggestive of ischemic heart disease.  Even her nonischemic cardiomyopathy and persistent  atrial fibrillation, I have recommended a rhythm control strategy to help stabilize her left ventricular function.  We discussed the 2 options for rhythm control including antiarrhythmic medications and ablation.  I do not think that an ablation only approach would be the best upfront choice for Ms. Starliper given the fairly severe left atrial dilation on her recent echocardiogram.  I would like to first try an antiarrhythmic.  I discussed using dofetilide or amiodarone.  We discussed the pros and cons of each option and ultimately decided that dofetilide would be the best initial choice in an effort to avoid the possible long-term off target effects of amiodarone.  We will provide her some information about dofetilide to make sure that it will be an affordable option for her.  We will plan to bring her into the hospital for dofetilide loading after the new year.  In the interim, she will continue on Xarelto until she runs out of her current supply.  She is asked me to write for apixaban for anticoagulation in hopes that this may be more affordable.  If it is not affordable, will need to discuss using Coumadin for stroke prophylaxis.  She will need to be on consistent, uninterrupted anticoagulation prior to her hospitalization for dofetilide loading.  If, in the future, she continues to have atrial fibrillation in a depressed ejection fraction despite treatment with dofetilide, can consider adjunctive ablation therapy to help maintain normal rhythm.  This will need to be discussed in more detail at a future visit.  2.  Hypertension Controlled during today's visit Continue lisinopril, metoprolol     Medication Adjustments/Labs and Tests  Ordered: Current medicines are reviewed at length with the patient today.  Concerns regarding medicines are outlined above.  No orders of the defined types were placed in this encounter.  No orders of the defined types were placed in this encounter.    Signed, Lars Mage, MD, [facility]  07/06/2020 10:52 AM    Electrophysiology [facility]

## 2020-07-06 NOTE — Telephone Encounter (Signed)
Pt c/o medication issue:  1. Name of Medication: new med from todays visit   2. How are you currently taking this medication (dosage and times per day)?not started   3. Are you having a reaction (difficulty breathing--STAT)? no  4. What is your medication issue? Patient calling to discuss med she will need after hospital admission.  Patient states she was told it would cost $100 and she cannot afford to pay that every mont   Please call to discuss

## 2020-07-06 NOTE — Patient Instructions (Addendum)
Medication Instructions:  - Your physician has recommended you make the following change in your medication:   1) Finish your current supply of Xarelto. Once the last dose is taken, then next day your will,  2) START eliquis 5 mg- take 1 tablet by mouth twice daily  - please take the Free 30 day trial card provided to you in the office today when you pick up your first prescription  - Samples provided today: Eliquis 5 mg Lot: [id number] Exp: 08/2022 # 1 box   *If you need a refill on your cardiac medications before your next appointment, please call your pharmacy*   Lab Work: - none ordered   If you have labs (blood work) drawn today and your tests are completely normal, you will receive your results only by: Ann White MyChart Message (if you have MyChart) OR . A paper copy in the mail If you have any lab test that is abnormal or we need to change your treatment, we will call you to review the results.   Testing/Procedures: - none ordered   Follow-Up: At [facility], you and your health needs are our priority.  As part of our continuing mission to provide you with exceptional heart care, we have created designated Provider Care Teams.  These Care Teams include your primary Cardiologist (physician) and Advanced Practice Providers (APPs -  Physician Assistants and Nurse Practitioners) who all work together to provide you with the care you need, when you need it.  We recommend signing up for the patient portal called "MyChart".  Sign up information is provided on this After Visit Summary.  MyChart is used to connect with patients for Virtual Visits (Telemedicine).  Patients are able to view lab/test results, encounter notes, upcoming appointments, etc.  Non-urgent messages can be sent to your provider as well.   To learn more about what you can do with MyChart, go to [email or web address].    Your next appointment:   Pending the timing of your Tikosyn admission   The format for your  next appointment:   In Person  Provider:   Lars Mage, MD   Other Instructions - Your physician has recommended that you start [place] will be contacted by our [facility] staff to arrange for an appointment/ admission for this for January  - There will be a 72 hour hospital stay for Tikosyn loading   Tikosyn (Dofetilide) capsules What is this medicine? DOFETILIDE (doe FET il ide) is an antiarrhythmic drug. It helps make your heart beat regularly. This medicine also helps to slow rapid heartbeats. This medicine may be used for other purposes; ask your health care provider or pharmacist if you have questions. COMMON BRAND NAME(S): Tikosyn What should I tell my health care provider before I take this medicine? They need to know if you have any of these conditions:  heart disease  history of irregular heartbeat  history of low levels of potassium or magnesium in the blood  kidney disease  liver disease  an unusual or allergic reaction to dofetilide, other medicines, foods, dyes, or preservatives  pregnant or trying to get pregnant  breast-feeding How should I use this medicine? Take this medicine by mouth with a glass of water. Follow the directions on the prescription label. Do not take with grapefruit juice. You can take it with or without food. If it upsets your stomach, take it with food. Take your medicine at regular intervals. Do not take it more often than directed. Do not  stop taking except on your doctor's advice. A special MedGuide will be given to you by the pharmacist with each prescription and refill. Be sure to read this information carefully each time. Talk to your pediatrician regarding the use of this medicine in children. Special care may be needed. Overdosage: If you think you have taken too much of this medicine contact a poison control center or emergency room at once. NOTE: This medicine is only for you. Do not share this medicine  with others. What if I miss a dose? If you miss a dose, skip it. Take your next dose at the normal time. Do not take extra or 2 doses at the same time to make up for the missed dose. What may interact with this medicine? Do not take this medicine with any of the following medications:  cimetidine  cisapride  dolutegravir  dronedarone  hydrochlorothiazide  ketoconazole  megestrol  pimozide  prochlorperazine  thioridazine  trimethoprim  verapamil This medicine may also interact with the following medications:  amiloride  cannabinoids  certain antibiotics like erythromycin or clarithromycin  certain antiviral medicines for HIV or hepatitis  certain medicines for depression, anxiety, or psychotic disorders  digoxin  diltiazem  grapefruit juice  metformin  nefazodone  other medicines that prolong the QT interval (an abnormal heart rhythm)  quinine  triamterene  zafirlukast  ziprasidone This list may not describe all possible interactions. Give your health care provider a list of all the medicines, herbs, non-prescription drugs, or dietary supplements you use. Also tell them if you smoke, drink alcohol, or use illegal drugs. Some items may interact with your medicine. What should I watch for while using this medicine? Your condition will be monitored carefully while you are receiving this medicine. What side effects may I notice from receiving this medicine? Side effects that you should report to your doctor or health care professional as soon as possible:  allergic reactions like skin rash, itching or hives, swelling of the face, lips, or tongue  breathing problems  chest pain or chest tightness  dizziness  signs and symptoms of a dangerous change in heartbeat or heart rhythm like chest pain; dizziness; fast or irregular heartbeat; palpitations; feeling faint or lightheaded, falls; breathing problems  signs and symptoms of electrolyte imbalance  like severe diarrhea, unusual sweating, vomiting, loss of appetite, increased thirst  swelling of the ankles, legs, or feet  tingling, numbness in the hands or feet Side effects that usually do not require medical attention (report to your doctor or health care professional if they continue or are bothersome):  diarrhea  general ill feeling or flu-like symptoms  headache  nausea  trouble sleeping  stomach pain This list may not describe all possible side effects. Call your doctor for medical advice about side effects. You may report side effects to FDA at 1-800-FDA-1088. Where should I keep my medicine? Keep out of the reach of children. Store at room temperature between 15 and 30 degrees C (59 and 86 degrees F). Throw away any unused medicine after the expiration date. NOTE: This sheet is a summary. It may not cover all possible information. If you have questions about this medicine, talk to your doctor, pharmacist, or health care provider.  2020 Elsevier/Gold Standard (2018-06-23 10:18:48)

## 2020-07-07 NOTE — Telephone Encounter (Signed)
Medication list reviewed in anticipation of upcoming Tikosyn initiation. Patient is taking sertraline which is QTc prolonging, however less so than other SSRIs. Ok to continue with close QTc monitoring.  Patient is anticoagulated on Eliquis 5mg  BID on the appropriate dose. Please ensure that patient has not missed any anticoagulation doses in the 3 weeks prior to Tikosyn initiation.   Patient will need to be counseled to avoid use of Benadryl while on Tikosyn and in the 2-3 days prior to Tikosyn initiation.  Re: medication costs. Eliquis copay will reset to $47/month in January as the $130/month price quote is her donut hole pricing. Also recommend that she fill dofetilide at [facility] after her admission and use GoodRx coupon - this will bring her monthly copay down to $20/month or $50/3 months instead of paying $100/month through her insurance as a Tier 4 medication.

## 2020-07-12 NOTE — Progress Notes (Signed)
Follow-up Outpatient Visit Date: 07/13/2020  Primary Care Provider: Ria Bush, MD [place] [address]  Chief Complaint: Follow-up atrial fibrillation and cardiomyopathy  HPI:  Ann White is a 74 y.o. female with history of non-ischemic cardiomyopathy diagnosed in 04/2017 (MPI without perfusion defectsLVEF as low as 35-40% but normalized on repeat echo in 04/2018), persistent atrial fibrillation diagnosed in 03/2020, carotid artery stenosis, abdominal aorta ectasia, right calf DVT, hypertension, hyperlipidemia, and chronic kidney disease, who presents for follow-up of cardiomyopathy and atrial fibrillation.  I last saw her a month ago, at which time she was noted to be back in atrial fibrillation following cardioversion in early November.  She was referred to EP and evaluated by Dr. Quentin Ore last week.  Plans have been made for transition to apixaban as well as initiation of dofetilide for antiarrhythmic therapy.  Today, Ms. Finton reports that she feels relatively well, similar to prior visits.  She denies shortness of breath, palpitations, lightheadedness, and edema.  She describes some mild chest discomfort when she is anxious but is otherwise without chest pain including when she is active.  She is tolerating her medications well without bleeding.  She remains concerned about the cost of medications, specifically dofetilide and NOAC therapy.  She has a few samples of rivaroxaban the left to get her through the rest of this year.  She worries about the ability to afford rivaroxaban or apixaban long-term when she is in the donut hole.  Though she remains somewhat concerned about warfarin, she feels like this may be her best long-term option.  CP when anxious.  No sOB or edema.  No bleeding.  --------------------------------------------------------------------------------------------------  Cardiovascular History & Procedures: Cardiovascular Problems:  Persistent  atrial fibrillation  Nonischemic cardiomyopathy  Carotid artery stenosis  Risk Factors:  Hypertension, hyperlipidemia, and age greater than 52  Cath/PCI:  None  CV Surgery:  None  EP Procedures and Devices:  DCCV (05/17/20)  48 hour Holter monitor (04/29/17): Sinus rhythm with frequent PACs and atrial runs, lasting up to 34 beats.  Non-Invasive Evaluation(s):  Carotid Doppler (04/17/2019): 40-59% stenosis in the carotid arteries bilaterally. Antegrade flow in the vertebral arteries. Normal subclavian flow.  TTE (04/24/18): Normal LV size. LVEF 55-60% with normal wall motion. Grade 2 diastolic dysfunction. Mild left atrial enlargement. Normal RV size and function. Mild pulmonary hypertension (PASP 41 mmHg).  Exercise MPI (06/12/17): No evidence of ischemia. LVEF 56%. Hypertensive blood pressure response to exercise.  Echo (04/29/17): Normal LV size with moderately reduced contraction. LVEF 35-40% with global hypokinesis. Mild MR. Mild left atrial enlargement. Normal RV size and function. Normal PA pressure.  Carotid Doppler (03/25/17):Heterogeneous plaque, bilaterally. Stable, 40-59% RICA stenosis. [id number] LICA stenosis. Stable, >50% RECA stenosis. Patent vertebral arteries with antegrade flow. Normal subclavian arteries, bilaterally.   Recent CV Pertinent Labs: Lab Results  Component Value Date   CHOL 150 03/25/2020   CHOL 191 09/14/2019   HDL 47.30 03/25/2020   HDL 59 09/14/2019   LDLCALC 65 03/25/2020   LDLCALC 107 (H) 09/14/2019   LDLDIRECT 132.0 03/18/2019   TRIG 191.0 (H) 03/25/2020   CHOLHDL 3 03/25/2020   INR 1.5 09/22/2015   INR 7.0 Repeated and verified X2. (HH) 06/13/2015   K 4.9 05/05/2020   MG 2.2 04/17/2017   BUN 26 05/05/2020   CREATININE 1.38 (H) 05/05/2020   CREATININE 1.19 07/26/2011    Past medical and surgical history were reviewed and updated in EPIC.  Current Meds  Medication Sig  .  acetaminophen (TYLENOL) 500 MG tablet  Take 500-1,000 mg by mouth every 6 (six) hours as needed (knee pain.).  Marland Kitchen cetirizine (ZYRTEC) 10 MG tablet Take 10 mg by mouth daily.   . Cholecalciferol (VITAMIN D3) 1000 UNITS CAPS Take 1 capsule (1,000 Units total) by mouth daily.  . clonazePAM (KLONOPIN) 1 MG tablet Take 0.5 mg by mouth at bedtime.  . docusate sodium (COLACE) 100 MG capsule Take 100-200 mg by mouth daily as needed (constipation.).  Marland Kitchen Evolocumab with Infusor (REPATHA PUSHTRONEX SYSTEM) 420 MG/3.5ML SOCT Inject 420 mg into the skin every 30 (thirty) days.  Marland Kitchen gabapentin (NEURONTIN) 300 MG capsule Take 300 mg by mouth at bedtime.  Marland Kitchen lisinopril (ZESTRIL) 2.5 MG tablet Take 1 tablet by mouth once daily  . metoprolol succinate (TOPROL XL) 25 MG 24 hr tablet Take 0.5 tablets (12.5 mg total) by mouth daily.  . Multiple Vitamin (MULTIVITAMIN WITH MINERALS) TABS tablet Take 1 tablet by mouth daily.  Marland Kitchen oxymetazoline (AFRIN) 0.05 % nasal spray Place 1 spray into both nostrils 2 (two) times daily as needed for congestion.  . pantoprazole (PROTONIX) 40 MG tablet Take 1 tablet by mouth once daily  . sertraline (ZOLOFT) 100 MG tablet Take 1 tablet by mouth once daily  . traMADol (ULTRAM) 50 MG tablet Take 1 tablet (50 mg total) by mouth 3 (three) times daily as needed (knee pain.).  Marland Kitchen vitamin B-12 (CYANOCOBALAMIN) 500 MCG tablet Take 500 mcg by mouth daily.   . [DISCONTINUED] Rivaroxaban (XARELTO) 15 MG TABS tablet Take 15 mg by mouth daily with supper.    Allergies: Sulfa antibiotics, Nexletol [bempedoic acid], Tricor [fenofibrate], Zetia [ezetimibe], Contrast media [iodinated diagnostic agents], Lipitor [atorvastatin], and Pravastatin  Social History   Tobacco Use  . Smoking status: Former Smoker    Packs/day: 0.25    Years: 3.00    Pack years: 0.75    Types: Cigarettes    Quit date: 1990    Years since quitting: 32.0  . Smokeless tobacco: Never Used  . Tobacco comment: minimal smoking history  Vaping Use  . Vaping Use: Never  used  Substance Use Topics  . Alcohol use: No    Alcohol/week: 0.0 standard drinks  . Drug use: No    Family History  Problem Relation Age of Onset  . CAD Mother 77       MI  . Hypertension Mother   . Parkinson's disease Father   . Diabetes Father   . CAD Father 60       MI  . Hyperlipidemia Sister   . Cancer Neg Hx   . Stroke Neg Hx   . Colon cancer Neg Hx   . Breast cancer Neg Hx   . Colon polyps Neg Hx   . Esophageal cancer Neg Hx   . Stomach cancer Neg Hx   . Rectal cancer Neg Hx     Review of Systems: A 12-system review of systems was performed and was negative except as noted in the HPI.  --------------------------------------------------------------------------------------------------  Physical Exam: BP 110/64 (BP Location: Left Arm, Patient Position: Sitting, Cuff Size: Normal)   Pulse 93   Ht 5\' 3"  (1.6 m)   Wt 136 lb (61.7 kg)   SpO2 98%   BMI 24.09 kg/m   General: NAD. Neck: No JVD or HJR. Lungs: Clear to auscultation bilaterally without wheezes or crackles. Heart: Irregularly irregular with 1/6 systolic murmur. Abdomen: Soft, nontender, nondistended. Extremities: No lower extremity edema.  EKG: Atrial fibrillation (ventricular rate 93  bpm) with nonspecific T wave abnormality.  Lab Results  Component Value Date   WBC 3.5 05/05/2020   HGB 10.7 (L) 05/05/2020   HCT 33.3 (L) 05/05/2020   MCV 89 05/05/2020   PLT 174 05/05/2020    Lab Results  Component Value Date   NA 140 05/05/2020   K 4.9 05/05/2020   CL 103 05/05/2020   CO2 21 05/05/2020   BUN 26 05/05/2020   CREATININE 1.38 (H) 05/05/2020   GLUCOSE 96 05/05/2020   ALT 10 03/25/2020    Lab Results  Component Value Date   CHOL 150 03/25/2020   HDL 47.30 03/25/2020   LDLCALC 65 03/25/2020   LDLDIRECT 132.0 03/18/2019   TRIG 191.0 (H) 03/25/2020   CHOLHDL 3 03/25/2020     --------------------------------------------------------------------------------------------------  ASSESSMENT AND PLAN: Persistent atrial fibrillation: Ms. Schmidbauer remains in atrial fibrillation today with reasonable heart rate control.  She has been evaluated by Dr. Quentin Ore with plans to undergo Tikosyn loading in an attempt to maintain sinus rhythm.  Our biggest issue remains affordability of anticoagulation.  She has samples of rivaroxaban to last her through the Harpreet Signore of this year.  I will provide her with a few additional weeks of samples so that we can help coordinate transition to warfarin through our anticoagulation clinic.  Dofetilide will not be cost prohibitive for Ms. Corinna Capra, and she should proceed with Tikosyn loading under the direction of Dr. Quentin Ore once her anticoagulation plan has been finalized.  Chronic HFrEF: Most likely NICM based on non-ischemic Myoview in 2018.  However, if LVEF does not improve with rhythm control and optimization of GDMT, repeat ischemia evaluation will need to be pursued.  Of note, she appears euvolemic with NYHA class I-II symptoms.  We will continue current doses of lisinopril and metoprolol, as borderline low BP precludes dose escalation at this time.  Hyperlipidemia: Continue evolocumab per pharmacy team.  Follow-up: RTC 3 months.  Nelva Bush, MD 07/15/2020 10:50 AM

## 2020-07-13 ENCOUNTER — Other Ambulatory Visit: Payer: Self-pay

## 2020-07-13 ENCOUNTER — Encounter: Payer: Self-pay | Admitting: Internal Medicine

## 2020-07-13 ENCOUNTER — Ambulatory Visit: Payer: Medicare HMO | Admitting: Internal Medicine

## 2020-07-13 VITALS — BP 110/64 | HR 93 | Ht 63.0 in | Wt 136.0 lb

## 2020-07-13 DIAGNOSIS — Z79899 Other long term (current) drug therapy: Secondary | ICD-10-CM | POA: Diagnosis not present

## 2020-07-13 DIAGNOSIS — E785 Hyperlipidemia, unspecified: Secondary | ICD-10-CM

## 2020-07-13 DIAGNOSIS — I4819 Other persistent atrial fibrillation: Secondary | ICD-10-CM | POA: Diagnosis not present

## 2020-07-13 DIAGNOSIS — I428 Other cardiomyopathies: Secondary | ICD-10-CM

## 2020-07-13 DIAGNOSIS — I5022 Chronic systolic (congestive) heart failure: Secondary | ICD-10-CM

## 2020-07-13 MED ORDER — RIVAROXABAN 15 MG PO TABS
15.0000 mg | ORAL_TABLET | Freq: Every day | ORAL | 5 refills | Status: DC
Start: 2020-07-13 — End: 2020-08-16

## 2020-07-13 NOTE — Telephone Encounter (Addendum)
Spoke with pt as she was referred to Coumadin clinic after her visit today with Dr End. Notes summarized below regarding med costs:  1. Anticoag - pt had been taking Xarelto 15mg  daily, this became expensive when she entered the donut hole. She was given samples of Eliquis. She is ok paying $47/month DOAC pricing which her insurance will reset to next week at the beginning of the new year. Advised pt she can use all the samples she has received, but reiterated that Eliquis must be taken twice daily without regard to food, and Xarelto must be taken once daily with her supper. She was unaware of both of these facts. She has about 7 weeks total of either Xarelto or Eliquis at home currently. Xarelto refill has been sent to her pharmacy; she will continue on this in the long run in 2022 until she hits the donut hole, and she will call clinic at that time to be transitioned to warfarin. Discussed that it's more ideal for her to continue on DOAC now while she is being initiated on Tikosyn to avoid weekly INR checks needed with warfarin therapy, which she understands.  2. PCSK9i - pt dropped off Praluent form today that she received in the mail, however these forms were mailed to everyone who had been on Praluent at any time in the past. She is currently taking Repatha 420mg  once monthly. She is aware that 2023 is faxing her pt assistance form for Repatha, and she will return this to [place] office once completed.  3. Tikosyn - She is aware of GoodRx and I advised her that copay will be the cheapest if she fills at [facility].

## 2020-07-13 NOTE — Patient Instructions (Signed)
Medication Instructions:   Your physician recommends that you continue on your current medications as directed. Please refer to the Current Medication list given to you today.  *If you need a refill on your cardiac medications before your next appointment, please call your pharmacy*   Lab Work: None ordered  Testing/Procedures: None ordered   Follow-Up: At [facility], you and your health needs are our priority.  As part of our continuing mission to provide you with exceptional heart care, we have created designated Provider Care Teams.  These Care Teams include your primary Cardiologist (physician) and Advanced Practice Providers (APPs -  Physician Assistants and Nurse Practitioners) who all work together to provide you with the care you need, when you need it.  We recommend signing up for the patient portal called "MyChart".  Sign up information is provided on this After Visit Summary.  MyChart is used to connect with patients for Virtual Visits (Telemedicine).  Patients are able to view lab/test results, encounter notes, upcoming appointments, etc.  Non-urgent messages can be sent to your provider as well.   To learn more about what you can do with MyChart, go to [email or web address].    Your next appointment:   3 month(s)  The format for your next appointment:   In Person  Provider:   You may see Yvonne Kendall, MD or one of the following Advanced Practice Providers on your designated Care Team:    Nicolasa Ducking, NP  Eula Listen, PA-C  Marisue Ivan, PA-C  Cadence Hemet, [place]  Gillian Shields, NP    Other Instructions  You have been referred to [facility].

## 2020-07-15 ENCOUNTER — Encounter: Payer: Self-pay | Admitting: Internal Medicine

## 2020-07-15 DIAGNOSIS — I5022 Chronic systolic (congestive) heart failure: Secondary | ICD-10-CM | POA: Insufficient documentation

## 2020-07-16 ENCOUNTER — Other Ambulatory Visit: Payer: Self-pay | Admitting: Internal Medicine

## 2020-07-18 NOTE — Telephone Encounter (Signed)
Admission set up for 1/24.

## 2020-07-18 NOTE — Telephone Encounter (Signed)
Noted  

## 2020-07-27 ENCOUNTER — Telehealth: Payer: Self-pay | Admitting: *Deleted

## 2020-07-27 NOTE — Telephone Encounter (Signed)
Patient sent [employer] patient assistance application for Repatha in the mail to Dr End.  Patient has already completed her portion. Dr End signed and completed his portion. Documents faxed successfully.

## 2020-08-04 ENCOUNTER — Other Ambulatory Visit
Admission: RE | Admit: 2020-08-04 | Discharge: 2020-08-04 | Disposition: A | Discharge status: Home / Self Care | Payer: Medicare HMO | Source: Ambulatory Visit | Attending: Internal Medicine | Admitting: Internal Medicine

## 2020-08-04 ENCOUNTER — Other Ambulatory Visit: Payer: Self-pay

## 2020-08-04 DIAGNOSIS — Z20822 Contact with and (suspected) exposure to covid-19: Secondary | ICD-10-CM | POA: Diagnosis not present

## 2020-08-04 DIAGNOSIS — Z01812 Encounter for preprocedural laboratory examination: Secondary | ICD-10-CM | POA: Diagnosis not present

## 2020-08-05 ENCOUNTER — Other Ambulatory Visit: Payer: Medicare HMO

## 2020-08-05 LAB — SARS CORONAVIRUS 2 (TAT 6-24 HRS): SARS Coronavirus 2: NEGATIVE

## 2020-08-05 NOTE — Telephone Encounter (Signed)
Patient notified that I have faxed her forms but not heard anything yet. She was appreciative.

## 2020-08-08 ENCOUNTER — Encounter (HOSPITAL_COMMUNITY): Payer: Self-pay | Admitting: Physician Assistant

## 2020-08-08 ENCOUNTER — Other Ambulatory Visit: Payer: Self-pay | Admitting: Family Medicine

## 2020-08-08 ENCOUNTER — Inpatient Hospital Stay (HOSPITAL_COMMUNITY)
Admission: AD | Admit: 2020-08-08 | Discharge: 2020-08-11 | DRG: 309 | Disposition: A | Discharge status: Home / Self Care | Payer: Medicare HMO | Source: Ambulatory Visit | Attending: Cardiology | Admitting: Cardiology

## 2020-08-08 ENCOUNTER — Other Ambulatory Visit: Payer: Self-pay

## 2020-08-08 ENCOUNTER — Ambulatory Visit (HOSPITAL_COMMUNITY)
Admission: RE | Admit: 2020-08-08 | Discharge: 2020-08-08 | Disposition: A | Discharge status: Home / Self Care | Payer: Medicare HMO | Source: Ambulatory Visit | Attending: Physician Assistant | Admitting: Physician Assistant

## 2020-08-08 ENCOUNTER — Encounter (HOSPITAL_COMMUNITY): Payer: Self-pay | Admitting: Cardiology

## 2020-08-08 VITALS — BP 134/84 | HR 88 | Ht 63.0 in | Wt 136.6 lb

## 2020-08-08 DIAGNOSIS — Z87891 Personal history of nicotine dependence: Secondary | ICD-10-CM | POA: Diagnosis not present

## 2020-08-08 DIAGNOSIS — Z91041 Radiographic dye allergy status: Secondary | ICD-10-CM

## 2020-08-08 DIAGNOSIS — D6869 Other thrombophilia: Secondary | ICD-10-CM | POA: Diagnosis present

## 2020-08-08 DIAGNOSIS — I4819 Other persistent atrial fibrillation: Secondary | ICD-10-CM | POA: Diagnosis not present

## 2020-08-08 DIAGNOSIS — Z882 Allergy status to sulfonamides status: Secondary | ICD-10-CM | POA: Diagnosis not present

## 2020-08-08 DIAGNOSIS — Z8249 Family history of ischemic heart disease and other diseases of the circulatory system: Secondary | ICD-10-CM

## 2020-08-08 DIAGNOSIS — I4892 Unspecified atrial flutter: Secondary | ICD-10-CM | POA: Diagnosis present

## 2020-08-08 DIAGNOSIS — Z83438 Family history of other disorder of lipoprotein metabolism and other lipidemia: Secondary | ICD-10-CM

## 2020-08-08 DIAGNOSIS — I428 Other cardiomyopathies: Secondary | ICD-10-CM | POA: Diagnosis present

## 2020-08-08 DIAGNOSIS — I130 Hypertensive heart and chronic kidney disease with heart failure and stage 1 through stage 4 chronic kidney disease, or unspecified chronic kidney disease: Secondary | ICD-10-CM | POA: Diagnosis present

## 2020-08-08 DIAGNOSIS — I5022 Chronic systolic (congestive) heart failure: Secondary | ICD-10-CM | POA: Diagnosis present

## 2020-08-08 DIAGNOSIS — Z86718 Personal history of other venous thrombosis and embolism: Secondary | ICD-10-CM | POA: Diagnosis not present

## 2020-08-08 DIAGNOSIS — I5020 Unspecified systolic (congestive) heart failure: Secondary | ICD-10-CM | POA: Diagnosis not present

## 2020-08-08 DIAGNOSIS — N179 Acute kidney failure, unspecified: Secondary | ICD-10-CM | POA: Diagnosis not present

## 2020-08-08 DIAGNOSIS — Z79899 Other long term (current) drug therapy: Secondary | ICD-10-CM

## 2020-08-08 DIAGNOSIS — E785 Hyperlipidemia, unspecified: Secondary | ICD-10-CM | POA: Diagnosis present

## 2020-08-08 DIAGNOSIS — Z7901 Long term (current) use of anticoagulants: Secondary | ICD-10-CM

## 2020-08-08 DIAGNOSIS — N183 Chronic kidney disease, stage 3 unspecified: Secondary | ICD-10-CM | POA: Diagnosis present

## 2020-08-08 DIAGNOSIS — Z888 Allergy status to other drugs, medicaments and biological substances status: Secondary | ICD-10-CM

## 2020-08-08 DIAGNOSIS — I959 Hypotension, unspecified: Secondary | ICD-10-CM | POA: Diagnosis present

## 2020-08-08 LAB — BASIC METABOLIC PANEL
Anion gap: 12 (ref 5–15)
BUN: 29 mg/dL — ABNORMAL HIGH (ref 8–23)
CO2: 25 mmol/L (ref 22–32)
Calcium: 9.5 mg/dL (ref 8.9–10.3)
Chloride: 104 mmol/L (ref 98–111)
Creatinine, Ser: 1.28 mg/dL — ABNORMAL HIGH (ref 0.44–1.00)
GFR, Estimated: 44 mL/min — ABNORMAL LOW (ref >60)
Glucose, Bld: 104 mg/dL — ABNORMAL HIGH (ref 70–99)
Potassium: 4.8 mmol/L (ref 3.5–5.1)
Sodium: 141 mmol/L (ref 135–145)

## 2020-08-08 LAB — MAGNESIUM: Magnesium: 2.3 mg/dL (ref 1.7–2.4)

## 2020-08-08 MED ORDER — DOCUSATE SODIUM 100 MG PO CAPS: 100.0000 mg | ORAL_CAPSULE | Freq: Every day | ORAL | Status: DC | PRN

## 2020-08-08 MED ORDER — VITAMIN D 25 MCG (1000 UNIT) PO TABS
1000.0000 [IU] | ORAL_TABLET | Freq: Every day | ORAL | Status: DC
  Administered 2020-08-09 – 2020-08-11 (×2): 1000 [IU] via ORAL
  Filled 2020-08-08 (×2): qty 1

## 2020-08-08 MED ORDER — METOPROLOL SUCCINATE ER 25 MG PO TB24
12.5000 mg | ORAL_TABLET | Freq: Every day | ORAL | Status: DC
  Filled 2020-08-08: qty 1

## 2020-08-08 MED ORDER — RIVAROXABAN 15 MG PO TABS
15.0000 mg | ORAL_TABLET | Freq: Every day | ORAL | Status: DC
  Administered 2020-08-08 – 2020-08-10 (×3): 15 mg via ORAL
  Filled 2020-08-08 (×3): qty 1

## 2020-08-08 MED ORDER — SODIUM CHLORIDE 0.9% FLUSH: 3.0000 mL | INTRAVENOUS | Status: DC | PRN

## 2020-08-08 MED ORDER — CYANOCOBALAMIN 500 MCG PO TABS
500.0000 ug | ORAL_TABLET | Freq: Every day | ORAL | Status: DC
  Administered 2020-08-09 – 2020-08-11 (×2): 500 ug via ORAL
  Filled 2020-08-08 (×3): qty 1

## 2020-08-08 MED ORDER — PANTOPRAZOLE SODIUM 40 MG PO TBEC
40.0000 mg | DELAYED_RELEASE_TABLET | Freq: Every day | ORAL | Status: DC
Start: 2020-08-09 — End: 2020-08-11
  Administered 2020-08-09 – 2020-08-11 (×3): 40 mg via ORAL
  Filled 2020-08-08 (×3): qty 1

## 2020-08-08 MED ORDER — SODIUM CHLORIDE 0.9 % IV SOLN: 250.0000 mL | INTRAVENOUS | Status: DC | PRN

## 2020-08-08 MED ORDER — LISINOPRIL 5 MG PO TABS
2.5000 mg | ORAL_TABLET | Freq: Every day | ORAL | Status: DC
  Administered 2020-08-09 – 2020-08-11 (×3): 2.5 mg via ORAL
  Filled 2020-08-08 (×4): qty 1

## 2020-08-08 MED ORDER — GABAPENTIN 300 MG PO CAPS
300.0000 mg | ORAL_CAPSULE | Freq: Every day | ORAL | Status: DC
  Administered 2020-08-08 – 2020-08-10 (×3): 300 mg via ORAL
  Filled 2020-08-08 (×3): qty 1

## 2020-08-08 MED ORDER — ACETAMINOPHEN 500 MG PO TABS
500.0000 mg | ORAL_TABLET | Freq: Four times a day (QID) | ORAL | Status: DC | PRN
  Administered 2020-08-08 – 2020-08-10 (×2): 500 mg via ORAL
  Administered 2020-08-10 – 2020-08-11 (×2): 1000 mg via ORAL
  Filled 2020-08-08 (×2): qty 2
  Filled 2020-08-08: qty 1
  Filled 2020-08-08: qty 2

## 2020-08-08 MED ORDER — OXYMETAZOLINE HCL 0.05 % NA SOLN: 1.0000 | Freq: Two times a day (BID) | NASAL | Status: DC | PRN

## 2020-08-08 MED ORDER — TRAMADOL HCL 50 MG PO TABS: 50.0000 mg | ORAL_TABLET | Freq: Three times a day (TID) | ORAL | Status: DC | PRN

## 2020-08-08 MED ORDER — DOFETILIDE 125 MCG PO CAPS
125.0000 ug | ORAL_CAPSULE | Freq: Two times a day (BID) | ORAL | Status: DC
  Administered 2020-08-08 – 2020-08-11 (×6): 125 ug via ORAL
  Filled 2020-08-08 (×7): qty 1

## 2020-08-08 MED ORDER — LORATADINE 10 MG PO TABS
10.0000 mg | ORAL_TABLET | Freq: Every day | ORAL | Status: DC
  Administered 2020-08-09 – 2020-08-11 (×3): 10 mg via ORAL
  Filled 2020-08-08 (×3): qty 1

## 2020-08-08 MED ORDER — SODIUM CHLORIDE 0.9% FLUSH
3.0000 mL | Freq: Two times a day (BID) | INTRAVENOUS | Status: DC
  Administered 2020-08-09 – 2020-08-11 (×4): 3 mL via INTRAVENOUS

## 2020-08-08 MED ORDER — CLONAZEPAM 0.5 MG PO TABS
0.5000 mg | ORAL_TABLET | Freq: Every day | ORAL | Status: DC
  Administered 2020-08-08 – 2020-08-10 (×3): 0.5 mg via ORAL
  Filled 2020-08-08 (×3): qty 1

## 2020-08-08 MED ORDER — SERTRALINE HCL 100 MG PO TABS
100.0000 mg | ORAL_TABLET | Freq: Every day | ORAL | Status: DC
  Administered 2020-08-09 – 2020-08-11 (×3): 100 mg via ORAL
  Filled 2020-08-08 (×5): qty 1

## 2020-08-08 NOTE — Telephone Encounter (Signed)
ERx 

## 2020-08-08 NOTE — H&P (Signed)
Electrophysiology H&P  Note   Primary Care Physician: Ria Bush, MD Primary Cardiologist: Dr End Primary Electrophysiologist: Dr Quentin Ore Referring Physician: Dr Walker Kehr Ann White is a 75 y.o. female with a history of non-ischemic cardiomyopathy diagnosed in 04/2017,persistent atrial fibrillation diagnosed in 03/2020,carotid artery stenosis, abdominal aorta ectasia, right calf DVT, hypertension, hyperlipidemia, and chronic kidney disease who presents for follow up in the [facility]. Patient is on Xarelto for a CHADS2VASC score of 5. She underwent DCCV on 05/17/20 but was back in afib on follow up. She is not very symptomatic with her afib but she does have a NICM on echo 04/14/20. She was referred to Dr Quentin Ore who recommended dofetilide admission.   On follow up today in AF clinic, patient presented for dofetilide admission. She denies any missed doses of anticoagulation in the last 3 weeks.   She denied symptoms of palpitations, chest pain, shortness of breath, orthopnea, PND, lower extremity edema, dizziness, presyncope, syncope, snoring, daytime somnolence, bleeding, or neurologic sequela. The patient is tolerating medications without difficulties and is otherwise without complaint today.   She presents now to the floor without change of symptoms or history as reported above.   Atrial Fibrillation Risk Factors:  she does not have symptoms or diagnosis of sleep apnea. she does not have a history of rheumatic fever.   she has a BMI of 24.2 . Vitals  BP: 134/84  Pulse: 88  Weight: 62 kg  Height: 5\' 3"  (1.6 m)     Family History  Problem Relation Age of Onset  . CAD Mother 32       MI  . Hypertension Mother   . Parkinson's disease Father   . Diabetes Father   . CAD Father 29       MI  . Hyperlipidemia Sister   . Cancer Neg Hx   . Stroke Neg Hx   . Colon cancer Neg Hx   . Breast cancer Neg Hx   . Colon polyps Neg Hx   .  Esophageal cancer Neg Hx   . Stomach cancer Neg Hx   . Rectal cancer Neg Hx      Atrial Fibrillation Management history:  Previous antiarrhythmic drugs: none Previous cardioversions: 05/17/20 Previous ablations: none CHADS2VASC score: 5 Anticoagulation history: Xarelto   Past Medical History:  Diagnosis Date  . Allergy   . Anxiety   . Arthritis   . Carotid stenosis    a. 02/2016 Carotid U/S: Bilateral 40-59%; b. 03/2017 Carotid U/S: RICA 06-23%, LICA 7-62%; c. 83/1517 Carotid U/S: bilat 40-59% ICA dzs.  . Cervical spondylosis    s/p spine injections  . CKD (chronic kidney disease), stage III ([place])   . Depression   . Ectatic abdominal aorta ([place]) 2016   rpt [place] 5 yrs  . GERD (gastroesophageal reflux disease)   . History of chicken pox   . History of diverticulitis of colon   . HLD (hyperlipidemia)   . HTN (hypertension)   . Irritable bowel syndrome with constipation   . Lichen sclerosus et atrophicus   . NICM (nonischemic cardiomyopathy) ([place])    a. 04/2017 Echo: EF 35-40%, diff HK; b. 05/2017 MV: EF 56%, no ischemia/infarct; c. 04/2018 Echo: EF 55-60%, no rwma, Gr2 DD; d. 03/2020 Echo: EF 40-45%, nl PASP, mod dil LA, mild to mod dil RA, Triv MR.  . Obesity   . Persistent atrial fibrillation ([place])    a. Dx 03/2020. CHA2DS2VASc = 5-->xarelto 15mg  daily.  Marland Kitchen  Premature atrial contraction    a. 04/2017 48hr Holter: Predominant rhythm - sinus. Rare PVC's, freq PAC's with freq atrial runs up to 34 beats-->metoprolol started.  . Seasonal allergic rhinitis    Past Surgical History:  Procedure Laterality Date  . bladder tack  1990s  . BREAST BIOPSY Right 03/05/2012    benign  . CARDIOVERSION N/A 05/17/2020   Procedure: CARDIOVERSION;  Surgeon: Nelva Bush, MD;  Location: [facility];  Service: Cardiovascular;  Laterality: N/A;  . CARPAL TUNNEL RELEASE  2004, 2013   bilateral  . CHOLECYSTECTOMY  1990s  . COLONOSCOPY  2002  . COLONOSCOPY  06/2013   mod diverticulosis, 3 tubular  adenomas, int hem rpt 5 yrs [facility])  . COLONOSCOPY  06/2018   TA,c olonic angiodysplastic lesion, mod diverticulosis, rpt 5 yrs [facility])  . DEXA  07/2011   normal, T score -0.3  . ESOPHAGOGASTRODUODENOSCOPY ENDOSCOPY  2004   normal [facility])  . TOTAL ABDOMINAL HYSTERECTOMY W/ BILATERAL SALPINGOOPHORECTOMY  1990s   complete, dysmenorrhea and fibroids    No current facility-administered medications for this encounter.    Allergies  Allergen Reactions  . Sulfa Antibiotics Swelling    Oral swelling  . Nexletol [Bempedoic Acid]     Dizziness, "eyes felt funny"  . Tricor [Fenofibrate] Other (See Comments)    myalgias  . Zetia [Ezetimibe] Other (See Comments)    Dizziness  . Contrast Media [Iodinated Diagnostic Agents] Other (See Comments)    Oral contrast caused mouth blisters  . Lipitor [Atorvastatin] Other (See Comments)    myalgias  . Pravastatin Other (See Comments)    myalgias    Social History   Socioeconomic History  . Marital status: Married    Spouse name: [name]   . Number of children: 2  . Years of education: Not on file  . Highest education level: Not on file  Occupational History  . Occupation: retired   Tobacco Use  . Smoking status: Former Smoker    Packs/day: 0.25    Years: 3.00    Pack years: 0.75    Types: Cigarettes    Quit date: 1990    Years since quitting: 32.0  . Smokeless tobacco: Never Used  . Tobacco comment: minimal smoking history  Vaping Use  . Vaping Use: Never used  Substance and Sexual Activity  . Alcohol use: No    Alcohol/week: 0.0 standard drinks  . Drug use: No  . Sexual activity: Never  Other Topics Concern  . Not on file  Social History Narrative   Lives with husband and 2 cats.  2 grown children, 5 grandchildren.   Occupation: retired, worked in [employer]   Activity: no regular exercise.  Does walk in summer   Diet: fruits/vegetables daily, good water.   Social Determinants of Health   Financial Resource Strain: Low Risk   .  Difficulty of Paying Living Expenses: Not hard at all  Food Insecurity: No Food Insecurity  . Worried About Charity fundraiser in the Last Year: Never true  . Ran Out of Food in the Last Year: Never true  Transportation Needs: No Transportation Needs  . Lack of Transportation (Medical): No  . Lack of Transportation (Non-Medical): No  Physical Activity: Inactive  . Days of Exercise per Week: 0 days  . Minutes of Exercise per Session: 0 min  Stress: No Stress Concern Present  . Feeling of Stress : Not at all  Social Connections: Not on file  Intimate Partner Violence: Not At Risk  .  Fear of Current or Ex-Partner: No  . Emotionally Abused: No  . Physically Abused: No  . Sexually Abused: No     ROS- All systems are reviewed and negative except as per the HPI above.  Physical Exam: There were no vitals filed for this visit.  GEN- The patient is well appearing, alert and oriented x 3 today.   Head- normocephalic, atraumatic Eyes-  Sclera clear, conjunctiva pink Ears- hearing intact Oropharynx- clear Neck- supple  Lungs- Clear to ausculation bilaterally, normal work of breathing Heart- irregular rate and rhythm, no murmurs, rubs or gallops  GI- soft, NT, ND, + BS Extremities- no clubbing, cyanosis, or edema MS- no significant deformity or atrophy Skin- no rash or lesion Psych- euthymic mood, full affect Neuro- strength and sensation are intact  Wt Readings from Last 3 Encounters:  08/08/20 62 kg  07/13/20 61.7 kg  07/06/20 62.1 kg    EKG today demonstrates  Afib  Vent. rate 88 BPM QRS duration 78 ms QT/QTc 340/411 ms  Echo 04/14/20 demonstrated  1. Left ventricular ejection fraction, by estimation, is 40 to 45%. The  left ventricle has mild to moderately decreased function. The left  ventricle has no regional wall motion abnormalities. Indeterminate  diastolic filling due to E-A fusion.  2. Right ventricular systolic function is normal. The right ventricular  size  is normal. There is normal pulmonary artery systolic pressure.  3. Left atrial size was moderately dilated.  4. Right atrial size was mild to moderately dilated.  5. The mitral valve is normal in structure. Trivial mitral valve  regurgitation. No evidence of mitral stenosis.  6. The aortic valve is normal in structure. Aortic valve regurgitation is  not visualized. No aortic stenosis is present.  7. The inferior vena cava is normal in size with greater than 50%  respiratory variability, suggesting right atrial pressure of 3 mmHg.   Epic records are reviewed at length today  CHA2DS2-VASc Score = 5  The patient's score is based upon: CHF History: Yes HTN History: Yes Diabetes History: No Stroke History: No Vascular Disease History: Yes Age Score: 1 Gender Score: 1      ASSESSMENT AND PLAN: 1. Persistent Atrial Fibrillation (ICD10:  I48.19) The patient's CHA2DS2-VASc score is 5, indicating a 7.2% annual risk of stroke.   Patient wants to pursue dofetilide, aware of risk vs benefit Aware of price of dofetilide Patient will continue on Xarelto 15 mg daily, states no missed doses No benadryl use PharmD has screened drugs, monitor QT on Zoloft.  QTc in SR 342 ms, Labs today show creatinine at 1.28, K+ 4.8 and mag 2.3, CrCl calculated at 38 mL/min Continue Toprol 12.5 mg daily  2. Secondary Hypercoagulable State (ICD10:  D68.69) The patient is at significant risk for stroke/thromboembolism based upon her CHA2DS2-VASc Score of 5.  Continue Rivaroxaban (Xarelto).   3. Systolic dysfunction NICM No signs or symptoms of fluid overload today. Hopefully this will improve with SR.  4. HTN Stable, no changes today.  She has presented for tikosyn admission as above.  Legrand Como [address]" [place], PA-C  08/08/2020 3:02 PM

## 2020-08-08 NOTE — Progress Notes (Signed)
Pharmacy: Dofetilide (Tikosyn) - Initial Consult Assessment and Electrolyte Replacement  Pharmacy consulted to assist in monitoring and replacing electrolytes in this 75 y.o. female admitted on 08/08/2020 undergoing dofetilide initiation. First dofetilide dose: 08/08/20  Assessment:  Patient Exclusion Criteria: If any screening criteria checked as "Yes", then  patient  should NOT receive dofetilide until criteria item is corrected.  If "Yes" please indicate correction plan.  YES  NO Patient  Exclusion Criteria Correction Plan   []   [x]   Baseline QTc interval is greater than or equal to 440 msec. IF above YES box checked dofetilide contraindicated unless patient has ICD; then may proceed if QTc 500-550 msec or with known ventricular conduction abnormalities may proceed with QTc 550-600 msec. QTc = 479ms    []   [x]   Patient is known or suspected to have a digoxin level greater than 2 ng/ml: No results found for: DIGOXIN     []   [x]   Creatinine clearance less than 20 ml/min (calculated using Cockcroft-Gault, actual body weight and serum creatinine): Estimated Creatinine Clearance: 32.4 mL/min (A) (by C-G formula based on SCr of 1.28 mg/dL (H)).     []   [x]  Patient has received drugs known to prolong the QT intervals within the last 48 hours (phenothiazines, tricyclics or tetracyclic antidepressants, erythromycin, H-1 antihistamines, cisapride, fluoroquinolones, azithromycin, ondansetron).   Updated information on QT prolonging agents is available to be searched on the following database:QT prolonging agents     []   [x]   Patient received a dose of hydrochlorothiazide (Oretic) alone or in any combination including triamterene (Dyazide, Maxzide) in the last 48 hours.    []   [x]  Patient received a medication known to increase dofetilide plasma concentrations prior to initial dofetilide dose:  . Trimethoprim (Primsol, Proloprim) in the last 36 hours . Verapamil (Calan, Verelan) in the  last 36 hours or a sustained release dose in the last 72 hours . Megestrol (Megace) in the last 5 days  . Cimetidine (Tagamet) in the last 6 hours . Ketoconazole (Nizoral) in the last 24 hours . Itraconazole (Sporanox) in the last 48 hours  . Prochlorperazine (Compazine) in the last 36 hours     []   [x]   Patient is known to have a history of torsades de pointes; congenital or acquired long QT syndromes.    []   [x]   Patient has received a Class 1 antiarrhythmic with less than 2 half-lives since last dose. (Disopyramide, Quinidine, Procainamide, Lidocaine, Mexiletine, Flecainide, Propafenone)    []   [x]   Patient has received amiodarone therapy in the past 3 months or amiodarone level is greater than 0.3 ng/ml.    Patient has been appropriately anticoagulated with xarelto.  Labs:    Component Value Date/Time   K 4.8 08/08/2020 1121   MG 2.3 08/08/2020 1121     Plan: Potassium: K >/= 4: Appropriate to initiate Tikosyn, no replacement needed    Magnesium: Mg >2: Appropriate to initiate Tikosyn, no replacement needed     Thank you for allowing pharmacy to participate in this patient's care   Erin Hearing PharmD., BCPS Clinical Pharmacist 08/08/2020 3:33 PM

## 2020-08-08 NOTE — Progress Notes (Signed)
Primary Care Physician: Ria Bush, MD Primary Cardiologist: Dr End Primary Electrophysiologist: Dr Quentin Ore Referring Physician: Dr Walker Kehr Ann White is a 75 y.o. female with a history of non-ischemic cardiomyopathy diagnosed in 04/2017,persistent atrial fibrillation diagnosed in 03/2020,carotid artery stenosis, abdominal aorta ectasia, right calf DVT, hypertension, hyperlipidemia, and chronic kidney disease who presents for follow up in the [facility]. Patient is on Xarelto for a CHADS2VASC score of 5. She underwent DCCV on 05/17/20 but was back in afib on follow up. She is not very symptomatic with her afib but she does have a NICM on echo 04/14/20. She was referred to Dr Quentin Ore who recommended dofetilide admission.   On follow up today, patient presents for dofetilide admission. She denies any missed doses of anticoagulation in the last 3 weeks.   Today, she denies symptoms of palpitations, chest pain, shortness of breath, orthopnea, PND, lower extremity edema, dizziness, presyncope, syncope, snoring, daytime somnolence, bleeding, or neurologic sequela. The patient is tolerating medications without difficulties and is otherwise without complaint today.    Atrial Fibrillation Risk Factors:  she does not have symptoms or diagnosis of sleep apnea. she does not have a history of rheumatic fever.   she has a BMI of Body mass index is 24.2 kg/m.Marland Kitchen Filed Weights   08/08/20 1129  Weight: 62 kg    Family History  Problem Relation Age of Onset  . CAD Mother 74       MI  . Hypertension Mother   . Parkinson's disease Father   . Diabetes Father   . CAD Father 50       MI  . Hyperlipidemia Sister   . Cancer Neg Hx   . Stroke Neg Hx   . Colon cancer Neg Hx   . Breast cancer Neg Hx   . Colon polyps Neg Hx   . Esophageal cancer Neg Hx   . Stomach cancer Neg Hx   . Rectal cancer Neg Hx      Atrial Fibrillation Management  history:  Previous antiarrhythmic drugs: none Previous cardioversions: 05/17/20 Previous ablations: none CHADS2VASC score: 5 Anticoagulation history: Xarelto   Past Medical History:  Diagnosis Date  . Allergy   . Anxiety   . Arthritis   . Carotid stenosis    a. 02/2016 Carotid U/S: Bilateral 40-59%; b. 03/2017 Carotid U/S: RICA [id number], LICA 123456; c. [id number] Carotid U/S: bilat 40-59% ICA dzs.  . Cervical spondylosis    s/p spine injections  . CKD (chronic kidney disease), stage III ([place])   . Depression   . Ectatic abdominal aorta ([place]) 2016   rpt [place] 5 yrs  . GERD (gastroesophageal reflux disease)   . History of chicken pox   . History of diverticulitis of colon   . HLD (hyperlipidemia)   . HTN (hypertension)   . Irritable bowel syndrome with constipation   . Lichen sclerosus et atrophicus   . NICM (nonischemic cardiomyopathy) ([place])    a. 04/2017 Echo: EF 35-40%, diff HK; b. 05/2017 MV: EF 56%, no ischemia/infarct; c. 04/2018 Echo: EF 55-60%, no rwma, Gr2 DD; d. 03/2020 Echo: EF 40-45%, nl PASP, mod dil LA, mild to mod dil RA, Triv MR.  . Obesity   . Persistent atrial fibrillation ([place])    a. Dx 03/2020. CHA2DS2VASc = 5-->xarelto 15mg  daily.  . Premature atrial contraction    a. 04/2017 48hr Holter: Predominant rhythm - sinus. Rare PVC's, freq PAC's with freq atrial runs up to 34  beats-->metoprolol started.  . Seasonal allergic rhinitis    Past Surgical History:  Procedure Laterality Date  . bladder tack  1990s  . BREAST BIOPSY Right 03/05/2012    benign  . CARDIOVERSION N/A 05/17/2020   Procedure: CARDIOVERSION;  Surgeon: Nelva Bush, MD;  Location: [facility];  Service: Cardiovascular;  Laterality: N/A;  . CARPAL TUNNEL RELEASE  2004, 2013   bilateral  . CHOLECYSTECTOMY  1990s  . COLONOSCOPY  2002  . COLONOSCOPY  06/2013   mod diverticulosis, 3 tubular adenomas, int hem rpt 5 yrs [facility] Plan)  . COLONOSCOPY  06/2018   TA,c olonic angiodysplastic lesion, mod  diverticulosis, rpt 5 yrs [facility] Plan)  . DEXA  07/2011   normal, T score -0.3  . ESOPHAGOGASTRODUODENOSCOPY ENDOSCOPY  2004   normal [facility] Plan)  . TOTAL ABDOMINAL HYSTERECTOMY W/ BILATERAL SALPINGOOPHORECTOMY  1990s   complete, dysmenorrhea and fibroids    Current Outpatient Medications  Medication Sig Dispense Refill  . acetaminophen (TYLENOL) 500 MG tablet Take 500-1,000 mg by mouth every 6 (six) hours as needed (knee pain.).    Marland Kitchen cetirizine (ZYRTEC) 10 MG tablet Take 10 mg by mouth daily.     . Cholecalciferol (VITAMIN D3) 1000 UNITS CAPS Take 1 capsule (1,000 Units total) by mouth daily.    . clonazePAM (KLONOPIN) 1 MG tablet Take 0.5 mg by mouth at bedtime.    . docusate sodium (COLACE) 100 MG capsule Take 100-200 mg by mouth daily as needed (constipation.).    Marland Kitchen gabapentin (NEURONTIN) 300 MG capsule Take 300 mg by mouth at bedtime.    Marland Kitchen lisinopril (ZESTRIL) 2.5 MG tablet Take 1 tablet by mouth once daily 90 tablet 0  . metoprolol succinate (TOPROL XL) 25 MG 24 hr tablet Take 0.5 tablets (12.5 mg total) by mouth daily. 45 tablet 1  . Multiple Vitamin (MULTIVITAMIN WITH MINERALS) TABS tablet Take 1 tablet by mouth daily.    Marland Kitchen oxymetazoline (AFRIN) 0.05 % nasal spray Place 1 spray into both nostrils 2 (two) times daily as needed for congestion.    . pantoprazole (PROTONIX) 40 MG tablet Take 1 tablet by mouth once daily 90 tablet 2  . Rivaroxaban (XARELTO) 15 MG TABS tablet Take 1 tablet (15 mg total) by mouth daily with supper. 30 tablet 5  . sertraline (ZOLOFT) 100 MG tablet Take 1 tablet by mouth once daily 90 tablet 3  . traMADol (ULTRAM) 50 MG tablet Take 1 tablet (50 mg total) by mouth 3 (three) times daily as needed (knee pain.). 120 tablet 0  . vitamin B-12 (CYANOCOBALAMIN) 500 MCG tablet Take 500 mcg by mouth daily.      No current facility-administered medications for this encounter.    Allergies  Allergen Reactions  . Sulfa Antibiotics Swelling    Oral swelling  . Nexletol  [Bempedoic Acid]     Dizziness, "eyes felt funny"  . Tricor [Fenofibrate] Other (See Comments)    myalgias  . Zetia [Ezetimibe] Other (See Comments)    Dizziness  . Contrast Media [Iodinated Diagnostic Agents] Other (See Comments)    Oral contrast caused mouth blisters  . Lipitor [Atorvastatin] Other (See Comments)    myalgias  . Pravastatin Other (See Comments)    myalgias    Social History   Socioeconomic History  . Marital status: Married    Spouse name: [name]   . Number of children: 2  . Years of education: Not on file  . Highest education level: Not on file  Occupational History  . Occupation:  retired   Tobacco Use  . Smoking status: Former Smoker    Packs/day: 0.25    Years: 3.00    Pack years: 0.75    Types: Cigarettes    Quit date: 1990    Years since quitting: 32.0  . Smokeless tobacco: Never Used  . Tobacco comment: minimal smoking history  Vaping Use  . Vaping Use: Never used  Substance and Sexual Activity  . Alcohol use: No    Alcohol/week: 0.0 standard drinks  . Drug use: No  . Sexual activity: Never  Other Topics Concern  . Not on file  Social History Narrative   Lives with husband and 2 cats.  2 grown children, 5 grandchildren.   Occupation: retired, worked in [employer]   Activity: no regular exercise.  Does walk in summer   Diet: fruits/vegetables daily, good water.   Social Determinants of Health   Financial Resource Strain: Low Risk   . Difficulty of Paying Living Expenses: Not hard at all  Food Insecurity: No Food Insecurity  . Worried About Charity fundraiser in the Last Year: Never true  . Ran Out of Food in the Last Year: Never true  Transportation Needs: No Transportation Needs  . Lack of Transportation (Medical): No  . Lack of Transportation (Non-Medical): No  Physical Activity: Inactive  . Days of Exercise per Week: 0 days  . Minutes of Exercise per Session: 0 min  Stress: No Stress Concern Present  . Feeling of Stress : Not at all   Social Connections: Not on file  Intimate Partner Violence: Not At Risk  . Fear of Current or Ex-Partner: No  . Emotionally Abused: No  . Physically Abused: No  . Sexually Abused: No     ROS- All systems are reviewed and negative except as per the HPI above.  Physical Exam: Vitals:   08/08/20 1129  BP: 134/84  Pulse: 88  Weight: 62 kg  Height: 5\' 3"  (1.6 m)    GEN- The patient is well appearing, alert and oriented x 3 today.   Head- normocephalic, atraumatic Eyes-  Sclera clear, conjunctiva pink Ears- hearing intact Oropharynx- clear Neck- supple  Lungs- Clear to ausculation bilaterally, normal work of breathing Heart- irregular rate and rhythm, no murmurs, rubs or gallops  GI- soft, NT, ND, + BS Extremities- no clubbing, cyanosis, or edema MS- no significant deformity or atrophy Skin- no rash or lesion Psych- euthymic mood, full affect Neuro- strength and sensation are intact  Wt Readings from Last 3 Encounters:  08/08/20 62 kg  07/13/20 61.7 kg  07/06/20 62.1 kg    EKG today demonstrates  Afib  Vent. rate 88 BPM QRS duration 78 ms QT/QTc 340/411 ms  Echo 04/14/20 demonstrated  1. Left ventricular ejection fraction, by estimation, is 40 to 45%. The  left ventricle has mild to moderately decreased function. The left  ventricle has no regional wall motion abnormalities. Indeterminate  diastolic filling due to E-A fusion.  2. Right ventricular systolic function is normal. The right ventricular  size is normal. There is normal pulmonary artery systolic pressure.  3. Left atrial size was moderately dilated.  4. Right atrial size was mild to moderately dilated.  5. The mitral valve is normal in structure. Trivial mitral valve  regurgitation. No evidence of mitral stenosis.  6. The aortic valve is normal in structure. Aortic valve regurgitation is  not visualized. No aortic stenosis is present.  7. The inferior vena cava is normal in size with  greater than  50%  respiratory variability, suggesting right atrial pressure of 3 mmHg.   Epic records are reviewed at length today  CHA2DS2-VASc Score = 5  The patient's score is based upon: CHF History: Yes HTN History: Yes Diabetes History: No Stroke History: No Vascular Disease History: Yes Age Score: 1 Gender Score: 1      ASSESSMENT AND PLAN: 1. Persistent Atrial Fibrillation (ICD10:  I48.19) The patient's CHA2DS2-VASc score is 5, indicating a 7.2% annual risk of stroke.   Patient wants to pursue dofetilide, aware of risk vs benefit Aware of price of dofetilide Patient will continue on Xarelto 15 mg daily, states no missed doses No benadryl use PharmD has screened drugs, monitor QT on Zoloft.  QTc in SR 342 ms, Labs today show creatinine at 1.28, K+ 4.8 and mag 2.3, CrCl calculated at 38 mL/min Continue Toprol 12.5 mg daily  2. Secondary Hypercoagulable State (ICD10:  D68.69) The patient is at significant risk for stroke/thromboembolism based upon her CHA2DS2-VASc Score of 5.  Continue Rivaroxaban (Xarelto).   3. Systolic dysfunction NICM No signs or symptoms of fluid overload today. Hopefully this will improve with SR.  4. HTN Stable, no changes today.   To be admitted later today once a bed becomes available.    [facility] [address] [place], [place] [address] [phone] 08/08/2020 11:52 AM

## 2020-08-09 DIAGNOSIS — I4819 Other persistent atrial fibrillation: Secondary | ICD-10-CM | POA: Diagnosis not present

## 2020-08-09 LAB — BASIC METABOLIC PANEL
Anion gap: 11 (ref 5–15)
BUN: 29 mg/dL — ABNORMAL HIGH (ref 8–23)
CO2: 22 mmol/L (ref 22–32)
Calcium: 9.1 mg/dL (ref 8.9–10.3)
Chloride: 102 mmol/L (ref 98–111)
Creatinine, Ser: 1.3 mg/dL — ABNORMAL HIGH (ref 0.44–1.00)
GFR, Estimated: 43 mL/min — ABNORMAL LOW (ref >60)
Glucose, Bld: 95 mg/dL (ref 70–99)
Potassium: 4.7 mmol/L (ref 3.5–5.1)
Sodium: 135 mmol/L (ref 135–145)

## 2020-08-09 LAB — MAGNESIUM: Magnesium: 2.2 mg/dL (ref 1.7–2.4)

## 2020-08-09 MED ORDER — METOPROLOL SUCCINATE ER 25 MG PO TB24
12.5000 mg | ORAL_TABLET | Freq: Every day | ORAL | Status: DC
  Administered 2020-08-09 – 2020-08-10 (×2): 12.5 mg via ORAL
  Filled 2020-08-09 (×2): qty 1

## 2020-08-09 NOTE — TOC Benefit Eligibility Note (Signed)
Transition of Care [facility]) Benefit Eligibility Note    Patient Details  Name: Ann White MRN: [id number] Date of Birth: 1946/03/27   Medication/Dose: Dofetilide 111mcg bid for 30 day supply  Covered?: Yes  Tier:  (B)Tier 4)  Prescription Coverage [facility]  Spoke with Person/Company/Phone Number:: Ann White. [facility]:[phone] A. Primary Plan) and  [employer]  [phone](B.Secondary plan)  Co-Pay: A)$200.00  B)$100.00  Prior Approval: Yes (A) [phone])  Deductible:  (?)       Ann White Phone Number: 08/09/2020, 2:01 PM

## 2020-08-09 NOTE — Progress Notes (Addendum)
Electrophysiology Rounding Note  Patient Name: Ann White Date of Encounter: 08/09/2020  Primary Cardiologist: Nelva Bush, MD  Electrophysiologist: Dr. Quentin Ore   Subjective   Pt remains in afib on Tikosyn 125 mcg BID   QTc from EKG last pm shows stable QTc at ~430 ms  The patient is doing well today.  At this time, the patient denies chest pain, shortness of breath, or any new concerns. She denies symptoms of SOB, tachy-palpitations, or lightheadedness when her HR elevates with exertion.   Inpatient Medications    Scheduled Meds: . cholecalciferol  1,000 Units Oral Daily  . clonazePAM  0.5 mg Oral QHS  . dofetilide  125 mcg Oral BID  . gabapentin  300 mg Oral QHS  . lisinopril  2.5 mg Oral Daily  . loratadine  10 mg Oral Daily  . metoprolol succinate  12.5 mg Oral Daily  . pantoprazole  40 mg Oral Daily  . rivaroxaban  15 mg Oral Q supper  . sertraline  100 mg Oral Daily  . sodium chloride flush  3 mL Intravenous Q12H  . vitamin B-12  500 mcg Oral Daily   Continuous Infusions: . sodium chloride     PRN Meds: sodium chloride, acetaminophen, docusate sodium, oxymetazoline, sodium chloride flush, traMADol   Vital Signs    Vitals:   08/08/20 1522 08/08/20 1600 08/08/20 1613 08/09/20 0651  BP: 125/81   107/84  Pulse: 94   94  Resp: 18   15  Temp: 98.6 F (37 C)   97.6 F (36.4 C)  TempSrc: Oral   Oral  Weight:   61.2 kg   Height:  5\' 3"  (1.6 m)      Intake/Output Summary (Last 24 hours) at 08/09/2020 0700 Last data filed at 08/08/2020 2000 Gross per 24 hour  Intake -  Output 360 ml  Net -360 ml   Filed Weights   08/08/20 1613  Weight: 61.2 kg    Physical Exam    GEN- The patient is well appearing, alert and oriented x 3 today.   Head- normocephalic, atraumatic Eyes-  Sclera clear, conjunctiva pink Ears- hearing intact Oropharynx- clear Neck- supple Lungs- Clear to ausculation bilaterally, normal work of breathing Heart- Irregularly  irregular rate and rhythm, no murmurs, rubs or gallops GI- soft, NT, ND, + BS Extremities- no clubbing, cyanosis, or edema Skin- no rash or lesion Psych- euthymic mood, full affect Neuro- strength and sensation are intact  Labs    CBC No results for input(s): WBC, NEUTROABS, HGB, HCT, MCV, PLT in the last 72 hours. Basic Metabolic Panel Recent Labs    08/08/20 1121 08/09/20 0219  NA 141 135  K 4.8 4.7  CL 104 102  CO2 25 22  GLUCOSE 104* 95  BUN 29* 29*  CREATININE 1.28* 1.30*  CALCIUM 9.5 9.1  MG 2.3 2.2    Potassium  Date/Time Value Ref Range Status  08/09/2020 02:19 AM 4.7 3.5 - 5.1 mmol/L Final   Magnesium  Date/Time Value Ref Range Status  08/09/2020 02:19 AM 2.2 1.7 - 2.4 mg/dL Final    Comment:    Performed at [facility], [place] [address] [place], [place] [address]    Telemetry    AF 70-90s mostly, with occasional paroxysmal into the 120-130s (? With exertion) (personally reviewed)  Radiology    No results found.   Patient Profile     Ann White is a 75 y.o. female with a past medical history significant  for persistent atrial fibrillation.  They were admitted for tikosyn load.   Assessment & Plan    1. Persistent atrial fibrillation Pt remains in afib on Tikosyn 125 mcg BID  Continue Xarelto Electrolytes stable. K4.7, Mg 2.2 CHA2DS2VASC is at least 4.  2. Chronic systolic CHF Echo 11/01/6220 LVEF 40-45% Continue Toprol and lisinopril Hopefully her NYHA symptoms will improve if able to maintain NSR.   If pt does not convert chemically, plan on DCCV tomorrow    For questions or updates, please contact [facility] Please consult [email or web address] for contact info under Cardiology/STEMI.  Signed, Shirley Friar, PA-C  08/09/2020, 7:00 AM

## 2020-08-09 NOTE — Plan of Care (Signed)

## 2020-08-09 NOTE — Progress Notes (Signed)
Morning EKG reviewed  Pt appears to now be in coarse fib vs atrial flutter at 87 bpm with stable QTc at ~410 ms.  Continue Tikosyn 125 mcg BID.   Pt will be NPO after midnight for DCCV if remains in [place], [place]  Pager: [phone]  08/09/2020 12:19 PM

## 2020-08-09 NOTE — Plan of Care (Signed)

## 2020-08-09 NOTE — Progress Notes (Signed)
Pharmacy: Dofetilide (Tikosyn) - Follow Up Assessment and Electrolyte Replacement  Pharmacy consulted to assist in monitoring and replacing electrolytes in this 75 y.o. female admitted on 08/08/2020 undergoing dofetilide initiation. First dofetilide dose: 08/08/20  Labs:    Component Value Date/Time   K 4.7 08/09/2020 0219   MG 2.2 08/09/2020 0219     Plan: Potassium: K >/= 4: No additional supplementation needed  Magnesium: Mg > 2: No additional supplementation needed   Patient has not required electrolyte supplementation.   Thank you for allowing pharmacy to participate in this patient's care   Erin Hearing PharmD., BCPS Clinical Pharmacist 08/09/2020 8:01 AM

## 2020-08-10 ENCOUNTER — Encounter (HOSPITAL_COMMUNITY): Payer: Self-pay | Admitting: Cardiology

## 2020-08-10 ENCOUNTER — Encounter (HOSPITAL_COMMUNITY)
Admission: AD | Disposition: A | Discharge status: Home / Self Care | Payer: Self-pay | Source: Ambulatory Visit | Attending: Cardiology

## 2020-08-10 DIAGNOSIS — I4819 Other persistent atrial fibrillation: Secondary | ICD-10-CM | POA: Diagnosis not present

## 2020-08-10 LAB — BASIC METABOLIC PANEL
Anion gap: 10 (ref 5–15)
BUN: 32 mg/dL — ABNORMAL HIGH (ref 8–23)
CO2: 23 mmol/L (ref 22–32)
Calcium: 9.4 mg/dL (ref 8.9–10.3)
Chloride: 104 mmol/L (ref 98–111)
Creatinine, Ser: 1.31 mg/dL — ABNORMAL HIGH (ref 0.44–1.00)
GFR, Estimated: 43 mL/min — ABNORMAL LOW (ref >60)
Glucose, Bld: 100 mg/dL — ABNORMAL HIGH (ref 70–99)
Potassium: 4.2 mmol/L (ref 3.5–5.1)
Sodium: 137 mmol/L (ref 135–145)

## 2020-08-10 LAB — MAGNESIUM: Magnesium: 2.2 mg/dL (ref 1.7–2.4)

## 2020-08-10 SURGERY — CANCELLED PROCEDURE

## 2020-08-10 MED ORDER — SODIUM CHLORIDE 0.9 % IV SOLN
INTRAVENOUS | Status: AC | PRN
  Administered 2020-08-10: 500 mL via INTRAVENOUS

## 2020-08-10 NOTE — Progress Notes (Signed)
Pharmacy: Dofetilide (Tikosyn) - Follow Up Assessment and Electrolyte Replacement  Pharmacy consulted to assist in monitoring and replacing electrolytes in this 75 y.o. female admitted on 08/08/2020 undergoing dofetilide initiation. First dofetilide dose: 08/08/20  Labs:    Component Value Date/Time   K 4.2 08/10/2020 0215   MG 2.2 08/10/2020 0215     Plan: Potassium: K >/= 4: No additional supplementation needed  Magnesium: Mg > 2: No additional supplementation needed   Patient has not required electrolyte supplementation.  Cardioverted to NSR this am.  Thank you for allowing pharmacy to participate in this patient's care   Erin Hearing PharmD., BCPS Clinical Pharmacist 08/10/2020 2:20 PM

## 2020-08-10 NOTE — Progress Notes (Signed)
Pt converted to sinus rhythm while in pre-procedure for cardioversion.  EKG obtained to confirm rhythm.  Pt transported back to [facility].  [place] Lawman, RN

## 2020-08-10 NOTE — Progress Notes (Signed)
  Morning EKG reviewed.   Pt converted to NSR/ sinus brady while in pre-procedure area for cardioversion. EKG shows has converted to NSR/sinus brady at 49 bpm with stable QTc at ~400 ms.  Continue Tikosyn 125 mcg BID.   Plan for home tomorrow if QTc remains stable.    Shirley Friar, [place]  Pager: [phone]  08/10/2020 12:59 PM

## 2020-08-10 NOTE — Progress Notes (Signed)
Electrophysiology Rounding Note  Patient Name: Ann White Date of Encounter: 08/10/2020  Primary Cardiologist: Nelva Bush, MD  Electrophysiologist: Dr. Quentin Ore   Subjective   Pt remains in afib (appears to have organized into flutter) on Tikosyn 125 mcg BID   QTc from EKG last pm shows stable QTc at ~440  The patient is doing well today.  At this time, the patient denies chest pain, shortness of breath, or any new concerns.  Inpatient Medications    Scheduled Meds: . cholecalciferol  1,000 Units Oral Daily  . clonazePAM  0.5 mg Oral QHS  . dofetilide  125 mcg Oral BID  . gabapentin  300 mg Oral QHS  . lisinopril  2.5 mg Oral Daily  . loratadine  10 mg Oral Daily  . metoprolol succinate  12.5 mg Oral QHS  . pantoprazole  40 mg Oral Daily  . rivaroxaban  15 mg Oral Q supper  . sertraline  100 mg Oral Daily  . sodium chloride flush  3 mL Intravenous Q12H  . vitamin B-12  500 mcg Oral Daily   Continuous Infusions: . sodium chloride     PRN Meds: sodium chloride, acetaminophen, docusate sodium, oxymetazoline, sodium chloride flush, traMADol   Vital Signs    Vitals:   08/09/20 1452 08/09/20 1659 08/09/20 2100 08/10/20 0515  BP: 119/81 117/73 106/69 110/63  Pulse: (!) 108 94  72  Resp: 16 16 16 19   Temp: 98.1 F (36.7 C) (!) 97.2 F (36.2 C) 98.1 F (36.7 C) 97.9 F (36.6 C)  TempSrc: Oral Oral Oral Oral  SpO2: 98% 98% 96% 100%  Weight:    59.4 kg  Height:        Intake/Output Summary (Last 24 hours) at 08/10/2020 0803 Last data filed at 08/09/2020 2030 Gross per 24 hour  Intake 240 ml  Output --  Net 240 ml   Filed Weights   08/08/20 1613 08/10/20 0515  Weight: 61.2 kg 59.4 kg    Physical Exam    GEN- The patient is well appearing, alert and oriented x 3 today.   Head- normocephalic, atraumatic Eyes-  Sclera clear, conjunctiva pink Ears- hearing intact Oropharynx- clear Neck- supple Lungs- Clear to ausculation bilaterally, normal  work of breathing Heart- Irregularly irregular rate and rhythm, no murmurs, rubs or gallops GI- soft, NT, ND, + BS Extremities- no clubbing, cyanosis, or edema Skin- no rash or lesion Psych- euthymic mood, full affect Neuro- strength and sensation are intact  Labs    CBC No results for input(s): WBC, NEUTROABS, HGB, HCT, MCV, PLT in the last 72 hours. Basic Metabolic Panel Recent Labs    08/09/20 0219 08/10/20 0215  NA 135 137  K 4.7 4.2  CL 102 104  CO2 22 23  GLUCOSE 95 100*  BUN 29* 32*  CREATININE 1.30* 1.31*  CALCIUM 9.1 9.4  MG 2.2 2.2    Potassium  Date/Time Value Ref Range Status  08/10/2020 02:15 AM 4.2 3.5 - 5.1 mmol/L Final   Magnesium  Date/Time Value Ref Range Status  08/10/2020 02:15 AM 2.2 1.7 - 2.4 mg/dL Final    Comment:    Performed at [facility], [place] [address], [place], [place] [address]    Telemetry    Atrial flutter 70-100s (personally reviewed)  Radiology    No results found.   Patient Profile     Ann White is a 75 y.o. female with a past medical history significant for persistent atrial fibrillation.  They were admitted for tikosyn load.   Assessment & Plan    1. Persistent atrial fibrillation / Atrial flutter Pt remains in afib/flutter on Tikosyn 125 mcg BID  Continue Xarelto Electrolytes stable. K 4.2, Mg 2.2 CHA2DS2VASC is at least 4.  2. Chronic systolic CHF Echo 3/40/3709 LVEF 40-45% Continue Toprol and lisinopril  She is scheduled for [facility] this am.    For questions or updates, please contact [place] Please consult [email or web address] for contact info under Cardiology/STEMI.  Signed, Shirley Friar, PA-C  08/10/2020, 8:03 AM

## 2020-08-10 NOTE — Progress Notes (Signed)
   08/10/20 1415  Clinical Encounter Type  Visited With Patient  Visit Type Initial  Referral From Nurse  Consult/Referral To Chaplain  Spiritual Encounters  Spiritual Needs Literature  Chaplain Called Ms. Jeyla and spoke with her about the AD. Informed her I will have the Unit Secretary bring her the AD paperwork and if she has any questions please have the nurse page the [place].  I also explained we are short on Notaries here in the hospital and she can have her husband get it  notarized by another Notary and then bring it back to the hospital so it can be placed in her chart and Epic.    Chaplain Vanessa Alesi Morgan-Simpson [phone]

## 2020-08-10 NOTE — Care Management (Signed)
1630 08-10-20 Patient presented for Tikosyn load. Case Manager spoke with patient and she is agreeable to have Tikosyn filled via [facility] for $30.00. Refills to be sent to [facility] to use [facility]. Medications will be delivered to the room via TOC. No further needs from Case Manager at this time. Bethena Roys, RN,BSN Case Manager

## 2020-08-11 ENCOUNTER — Other Ambulatory Visit (HOSPITAL_COMMUNITY): Payer: Self-pay | Admitting: Student

## 2020-08-11 DIAGNOSIS — I4819 Other persistent atrial fibrillation: Secondary | ICD-10-CM | POA: Diagnosis not present

## 2020-08-11 LAB — BASIC METABOLIC PANEL
Anion gap: 9 (ref 5–15)
Anion gap: 11 (ref 5–15)
BUN: 45 mg/dL — ABNORMAL HIGH (ref 8–23)
BUN: 42 mg/dL — ABNORMAL HIGH (ref 8–23)
CO2: 24 mmol/L (ref 22–32)
CO2: 23 mmol/L (ref 22–32)
Calcium: 9.3 mg/dL (ref 8.9–10.3)
Calcium: 9.4 mg/dL (ref 8.9–10.3)
Chloride: 102 mmol/L (ref 98–111)
Chloride: 100 mmol/L (ref 98–111)
Creatinine, Ser: 1.72 mg/dL — ABNORMAL HIGH (ref 0.44–1.00)
Creatinine, Ser: 1.57 mg/dL — ABNORMAL HIGH (ref 0.44–1.00)
GFR, Estimated: 31 mL/min — ABNORMAL LOW (ref >60)
GFR, Estimated: 35 mL/min — ABNORMAL LOW (ref >60)
Glucose, Bld: 107 mg/dL — ABNORMAL HIGH (ref 70–99)
Glucose, Bld: 82 mg/dL (ref 70–99)
Potassium: 4.9 mmol/L (ref 3.5–5.1)
Potassium: 4.3 mmol/L (ref 3.5–5.1)
Sodium: 135 mmol/L (ref 135–145)
Sodium: 134 mmol/L — ABNORMAL LOW (ref 135–145)

## 2020-08-11 LAB — MAGNESIUM: Magnesium: 2 mg/dL (ref 1.7–2.4)

## 2020-08-11 MED ORDER — DOFETILIDE 125 MCG PO CAPS: 125.0000 ug | ORAL_CAPSULE | Freq: Two times a day (BID) | ORAL | 6 refills | Status: DC

## 2020-08-11 MED ORDER — MAGNESIUM SULFATE 2 GM/50ML IV SOLN
2.0000 g | Freq: Once | INTRAVENOUS | Status: AC
  Administered 2020-08-11: 2 g via INTRAVENOUS
  Filled 2020-08-11: qty 50

## 2020-08-11 MED ORDER — SODIUM CHLORIDE 0.9 % IV BOLUS
250.0000 mL | Freq: Once | INTRAVENOUS | Status: AC
  Administered 2020-08-11: 250 mL via INTRAVENOUS

## 2020-08-11 MED FILL — DOFETILIDE 125 MCG CAPS: 125 | 30 days supply | Qty: 60 | Fill #0

## 2020-08-11 NOTE — Plan of Care (Signed)

## 2020-08-11 NOTE — Discharge Summary (Signed)
ELECTROPHYSIOLOGY PROCEDURE DISCHARGE SUMMARY    Patient ID: Ann White,  MRN: [id number], DOB/AGE: February 26, 1946 75 y.o.  Admit date: 08/08/2020 Discharge date: 08/11/2020  Primary Care Physician: Ria Bush, MD  Primary Cardiologist: Nelva Bush, MD  Electrophysiologist: Dr. Quentin Ore  Primary Discharge Diagnosis:  1.  Persistent atrial fibrillation status post Tikosyn loading this admission  Secondary Discharge Diagnosis:  2. Chronic systolic CHF 3. AKI  Allergies  Allergen Reactions  . Sulfa Antibiotics Swelling    Oral swelling  . Nexletol [Bempedoic Acid]     Dizziness, "eyes felt funny"  . Tricor [Fenofibrate] Other (See Comments)    myalgias  . Zetia [Ezetimibe] Other (See Comments)    Dizziness  . Contrast Media [Iodinated Diagnostic Agents] Other (See Comments)    Oral contrast caused mouth blisters  . Lipitor [Atorvastatin] Other (See Comments)    myalgias  . Pravastatin Other (See Comments)    myalgias     Procedures This Admission:  1.  Tikosyn loading  Brief HPI: Ann White is a 75 y.o. female with a past medical history as noted above.  They were referred to EP in the outpatient setting for treatment options of atrial fibrillation.  Risks, benefits, and alternatives to Tikosyn were reviewed with the patient who wished to proceed.    Hospital Course:  The patient was admitted and Tikosyn was initiated.  Renal function and electrolytes were followed during the hospitalization.  Their QTc remained stable.  On 1/26 she was planned for direct current cardioversion, but converted to sinus rhythm while in pre-procedure. She was monitored until discharge on telemetry which demonstrated Sinubs brady/normal sinus.  On the day of discharge, they were examined by Dr. Quentin Ore  who considered them stable for discharge to home.  Follow-up has been arranged with the Atrial Fibrillation clinic in approximately 1 week and with Dr. Quentin Ore  in 4  weeks.   She had mild hypomagnesemia that was supplemented. Her K remained stable.   Pt did have AKI with unclear etiology on day of discharge. No symptoms of hypovolemia otherwise, and not on diuretics. Repeat showed Cr 1.5. She likely had mild AKI in the setting of hypotension.  She is still within range to continue her current dose of Tikosyn.   Close follow up with HF TOC clinic has been made.   Physical Exam: Vitals:   08/11/20 1210 08/11/20 1215 08/11/20 1216 08/11/20 1235  BP: (!) 76/38 (!) 76/40 (!) 76/48 (!) 89/66  Pulse:  (!) 51 (!) 52   Resp:  18 20 20   Temp:  98.3 F (36.8 C) 98.4 F (36.9 C) 98.4 F (36.9 C)  TempSrc:  Oral Oral Oral  SpO2:  96%    Weight:      Height:        GEN- The patient is well appearing, alert and oriented x 3 today.   HEENT: normocephalic, atraumatic; sclera clear, conjunctiva pink; hearing intact; oropharynx clear; neck supple, no JVP Lymph- no cervical lymphadenopathy Lungs- Clear to ausculation bilaterally, normal work of breathing.  No wheezes, rales, rhonchi Heart- Regular rate and rhythm, no murmurs, rubs or gallops, PMI not laterally displaced GI- soft, non-tender, non-distended, bowel sounds present, no hepatosplenomegaly Extremities- no clubbing, cyanosis, or edema; DP/PT/radial pulses 2+ bilaterally MS- no significant deformity or atrophy Skin- warm and dry, no rash or lesion Psych- euthymic mood, full affect Neuro- strength and sensation are intact   Labs:   Lab Results  Component Value Date  WBC 3.5 05/05/2020   HGB 10.7 (L) 05/05/2020   HCT 33.3 (L) 05/05/2020   MCV 89 05/05/2020   PLT 174 05/05/2020    Recent Labs  Lab 08/11/20 0843  NA 134*  K 4.3  CL 100  CO2 23  BUN 42*  CREATININE 1.57*  CALCIUM 9.4  GLUCOSE 82     Discharge Medications:  Allergies as of 08/11/2020      Reactions   Sulfa Antibiotics Swelling   Oral swelling   Nexletol [bempedoic Acid]    Dizziness, "eyes felt funny"   Tricor  [fenofibrate] Other (See Comments)   myalgias   Zetia [ezetimibe] Other (See Comments)   Dizziness   Contrast Media [iodinated Diagnostic Agents] Other (See Comments)   Oral contrast caused mouth blisters   Lipitor [atorvastatin] Other (See Comments)   myalgias   Pravastatin Other (See Comments)   myalgias      Medication List    STOP taking these medications   lisinopril 2.5 MG tablet Commonly known as: ZESTRIL     TAKE these medications   acetaminophen 500 MG tablet Commonly known as: TYLENOL Take 500-1,000 mg by mouth every 6 (six) hours as needed (knee pain.).   cetirizine 10 MG tablet Commonly known as: ZYRTEC Take 10 mg by mouth daily.   clonazePAM 1 MG tablet Commonly known as: KLONOPIN Take 1 tablet by mouth twice daily as needed What changed: reasons to take this   docusate sodium 100 MG capsule Commonly known as: COLACE Take 100-200 mg by mouth daily as needed (constipation.).   dofetilide 125 MCG capsule Commonly known as: TIKOSYN Take 1 capsule (125 mcg total) by mouth 2 (two) times daily.   gabapentin 300 MG capsule Commonly known as: NEURONTIN Take 300 mg by mouth at bedtime.   metoprolol succinate 25 MG 24 hr tablet Commonly known as: Toprol XL Take 0.5 tablets (12.5 mg total) by mouth daily.   multivitamin with minerals Tabs tablet Take 1 tablet by mouth daily.   oxymetazoline 0.05 % nasal spray Commonly known as: AFRIN Place 1 spray into both nostrils 2 (two) times daily as needed for congestion.   pantoprazole 40 MG tablet Commonly known as: PROTONIX Take 1 tablet by mouth once daily   Rivaroxaban 15 MG Tabs tablet Commonly known as: XARELTO Take 1 tablet (15 mg total) by mouth daily with supper.   sertraline 100 MG tablet Commonly known as: ZOLOFT Take 1 tablet by mouth once daily   traMADol 50 MG tablet Commonly known as: ULTRAM Take 1 tablet (50 mg total) by mouth 3 (three) times daily as needed (knee pain.).   vitamin B-12  500 MCG tablet Commonly known as: CYANOCOBALAMIN Take 500 mcg by mouth daily.   Vitamin D3 25 MCG (1000 UT) Caps Take 1 capsule (1,000 Units total) by mouth daily.       Disposition:    Follow-up Information    [facility] Follow up.   Specialty: Cardiology Why: on 08/18/2020 at 1130 for post hospital tikosyn follow up Contact information: [address] [phone] [place]. Go on 08/16/2020.   Specialty: Cardiology Why: You have a Heart Impact TOC appointment with Dr. Shan Levans on Tuesday, 08/16/20 at 3pm.  Please bring all your medications with you to your appointment.  Contact information: [address] [phone] [place] [phone]  Duration of Discharge Encounter: Greater than 30 minutes including physician time.  Jacalyn Lefevre, PA-C  08/11/2020 1:28 PM

## 2020-08-11 NOTE — Plan of Care (Signed)
  Problem: Education: Goal: Knowledge of General Education information will improve Description: Including pain rating scale, medication(s)/side effects and non-pharmacologic comfort measures Outcome: Adequate for Discharge   

## 2020-08-11 NOTE — Progress Notes (Signed)
Discharge instructions (including medications) discussed with and copy provided to patient/caregiver 

## 2020-08-11 NOTE — Progress Notes (Signed)
   08/11/20 1216  Assess: MEWS Score  Temp 98.4 F (36.9 C)  BP (!) 76/48  Pulse Rate (!) 52  ECG Heart Rate (!) 52  Resp 20  Level of Consciousness Alert  Assess: MEWS Score  MEWS Temp 0  MEWS Systolic 2  MEWS Pulse 0  MEWS RR 0  MEWS LOC 0  MEWS Score 2  MEWS Score Color Yellow  Assess: if the MEWS score is Yellow or Red  Were vital signs taken at a resting state? Yes  Focused Assessment No change from prior assessment  Early Detection of Sepsis Score *See Row Information* Low  MEWS guidelines implemented *See Row Information* Yes  Treat  MEWS Interventions Escalated (See documentation below) (spoke with provider)  Pain Scale 0-10  Take Vital Signs  Increase Vital Sign Frequency  Yellow: Q 2hr X 2 then Q 4hr X 2, if remains yellow, continue Q 4hrs  Escalate  MEWS: Escalate Yellow: discuss with charge nurse/RN and consider discussing with provider and RRT  Notify: Charge Nurse/RN  Name of Charge Nurse/RN Notified Jessica L.  Date Charge Nurse/RN Notified 08/11/20  Time Charge Nurse/RN Notified 1218  Notify: Provider  Provider Name/Title andy, md  Date Provider Notified 08/11/20  Time Provider Notified 1218  Notification Type Call  Notification Reason Change in status  Response See new orders  Date of Provider Response 08/11/20  Time of Provider Response 1220

## 2020-08-11 NOTE — Progress Notes (Signed)
Heart Failure Nurse Navigator Progress Note  PCP: Ria Bush, MD PCP-Cardiologist: Nelva Bush, MD Admission Diagnosis: AF RVR Admitted from:  Home with spouse  Presentation:   Ann White presented with AF for tikosyn load. Pleasant lady laying in bed, AOx4 talking on phone. BP 75-88sys. Jonni Sanger, [place] notified by bedside nurse- treating with 282ml NS bolus, replacing Mg++. Plan DC today.   ECHO/ LVEF: 9/21 40-45%   Clinical Course:  Past Medical History:  Diagnosis Date   Allergy    Anxiety    Arthritis    Carotid stenosis    a. 02/2016 Carotid U/S: Bilateral 40-59%; b. 03/2017 Carotid U/S: RICA 47-82%, LICA 9-56%; c. 21/3086 Carotid U/S: bilat 40-59% ICA dzs.   Cervical spondylosis    s/p spine injections   CKD (chronic kidney disease), stage III (HCC)    Depression    Ectatic abdominal aorta ([place]) 2016   rpt [place] 5 yrs   GERD (gastroesophageal reflux disease)    History of chicken pox    History of diverticulitis of colon    HLD (hyperlipidemia)    HTN (hypertension)    Irritable bowel syndrome with constipation    Lichen sclerosus et atrophicus    NICM (nonischemic cardiomyopathy) ([place])    a. 04/2017 Echo: EF 35-40%, diff HK; b. 05/2017 MV: EF 56%, no ischemia/infarct; c. 04/2018 Echo: EF 55-60%, no rwma, Gr2 DD; d. 03/2020 Echo: EF 40-45%, nl PASP, mod dil LA, mild to mod dil RA, Triv MR.   Obesity    Persistent atrial fibrillation ([place])    a. Dx 03/2020. CHA2DS2VASc = 5-->xarelto 15mg  daily.   Premature atrial contraction    a. 04/2017 48hr Holter: Predominant rhythm - sinus. Rare PVC's, freq PAC's with freq atrial runs up to 34 beats-->metoprolol started.   Seasonal allergic rhinitis      Social History   Socioeconomic History   Marital status: Married    Spouse name: Freddie   Number of children: 2   Years of education: Not on file   Highest education level: Not on file  Occupational History   Occupation: retired   Tobacco  Use   Smoking status: Former Smoker    Packs/day: 0.25    Years: 3.00    Pack years: 0.75    Types: Cigarettes    Quit date: 1990    Years since quitting: 32.0   Smokeless tobacco: Never Used   Tobacco comment: minimal smoking history  Vaping Use   Vaping Use: Never used  Substance and Sexual Activity   Alcohol use: No    Alcohol/week: 0.0 standard drinks   Drug use: No   Sexual activity: Never  Other Topics Concern   Not on file  Social History Narrative   Lives with husband and 2 cats.  2 grown children, 5 grandchildren.   Occupation: retired, worked in [employer]   Activity: no regular exercise.  Does walk in summer   Diet: fruits/vegetables daily, good water.   Social Determinants of Health   Financial Resource Strain: Low Risk    Difficulty of Paying Living Expenses: Not hard at all  Food Insecurity: No Food Insecurity   Worried About Charity fundraiser in the Last Year: Never true   [place] in the Last Year: Never true  Transportation Needs: No Transportation Needs   Lack of Transportation (Medical): No   Lack of Transportation (Non-Medical): No  Physical Activity: Inactive   Days of Exercise per Week: 0 days  Minutes of Exercise per Session: 0 min  Stress: No Stress Concern Present   Feeling of Stress : Not at all  Social Connections: Not on file   High Risk Criteria for Readmission and/or Poor Patient Outcomes:  Heart failure hospital admissions (last 6 months): 2   No Show rate: 0%  Difficult social situation: no, great support at home. Pt fully capable.   Demonstrates medication adherence: yes.  Primary Language: english  Literacy level: able to read/write. Good comprehension  Barriers of Care:   none  Considerations/Referrals:   Referral made to Heart Failure Pharmacist Stewardship: no Referral made to Heart Impact TOC clinic: yes, appreciated by EP team.  Items for Follow-up on DC/TOC: -med titration. Pt unable to take  lisinopril at DC d/t lower BP, needs management from HF standpoint.  TOC 2/1 @ 3pm Has AF clinic appt 2/3.

## 2020-08-11 NOTE — Progress Notes (Signed)
1/26 evening EKG reviewed  Shows remains in NSR at 66 bpm with stable QTc at ~400 ms.  Continue Tikosyn 125 mcg BID.   Electrolytes stable at K 4.9, Mg 2.0  AKI noted with Cr 1.31 -> 1.72.  Repeat pending.   She is not on diuretics. Ordered for 2.5 mg lisinopril.    She denies any lightheadedness or dizziness.   Plan for home today if QTc remains stable after am dose.    Shirley Friar, PA-C  Pager: [phone]  08/11/2020 8:21 AM

## 2020-08-11 NOTE — Progress Notes (Signed)
Pharmacy: Dofetilide (Tikosyn) - Follow Up Assessment and Electrolyte Replacement  Pharmacy consulted to assist in monitoring and replacing electrolytes in this 75 y.o. female admitted on 08/08/2020 undergoing dofetilide initiation. First dofetilide dose: 08/08/20  Labs:    Component Value Date/Time   K 4.9 08/11/2020 0336   MG 2.0 08/11/2020 0336     Plan: Potassium: K >/= 4: No additional supplementation needed  Magnesium: Mg 1.8-2: Give Mg 2 gm IV x1   As patient has required no potassium replacement this admission, recommend no supplementation at time of discharge.   Thank you for allowing pharmacy to participate in this patient's care   Antonietta Jewel, PharmD, [place] Pharmacist  Phone: [phone] 08/11/2020 10:23 AM  Please check AMION for all [place] phone numbers After 10:00 PM, call [place] [phone]

## 2020-08-11 NOTE — Progress Notes (Signed)
Pt bp up to 95/50. Paged provider Jonni Sanger to be sure pt is still being discharged home. Pt ok to go home. Will continue to monitor pt.

## 2020-08-11 NOTE — Plan of Care (Signed)
  Problem: Clinical Measurements: Goal: Ability to maintain clinical measurements within normal limits will improve Outcome: Progressing   Problem: Clinical Measurements: Goal: Will remain free from infection Outcome: Progressing   

## 2020-08-11 NOTE — Progress Notes (Signed)
Pt with a low bp and feeling fuzzy. Spoke with Jonni Sanger over the phone regarding pt's low bp and feeling. Pt to get a 250 bolus. HF navigator to assess pt at bedside. Will continue to monitor pt.

## 2020-08-15 ENCOUNTER — Telehealth (HOSPITAL_COMMUNITY): Payer: Self-pay | Admitting: *Deleted

## 2020-08-15 ENCOUNTER — Ambulatory Visit (INDEPENDENT_AMBULATORY_CARE_PROVIDER_SITE_OTHER): Payer: Medicare HMO

## 2020-08-15 ENCOUNTER — Other Ambulatory Visit: Payer: Self-pay

## 2020-08-15 DIAGNOSIS — I6523 Occlusion and stenosis of bilateral carotid arteries: Secondary | ICD-10-CM

## 2020-08-15 NOTE — Telephone Encounter (Signed)
Spoke w/pt and confirmed appt for tom 2/1, pt states she will be here with her medications, directions provided.

## 2020-08-16 ENCOUNTER — Ambulatory Visit (HOSPITAL_COMMUNITY)
Admit: 2020-08-16 | Discharge: 2020-08-16 | Disposition: A | Discharge status: Home / Self Care | Payer: Medicare HMO | Attending: Internal Medicine | Admitting: Internal Medicine

## 2020-08-16 VITALS — BP 148/76 | HR 49 | Ht 63.0 in | Wt 134.2 lb

## 2020-08-16 DIAGNOSIS — I5022 Chronic systolic (congestive) heart failure: Secondary | ICD-10-CM

## 2020-08-16 DIAGNOSIS — I2510 Atherosclerotic heart disease of native coronary artery without angina pectoris: Secondary | ICD-10-CM | POA: Diagnosis not present

## 2020-08-16 DIAGNOSIS — Z7901 Long term (current) use of anticoagulants: Secondary | ICD-10-CM | POA: Diagnosis not present

## 2020-08-16 DIAGNOSIS — Z86718 Personal history of other venous thrombosis and embolism: Secondary | ICD-10-CM | POA: Insufficient documentation

## 2020-08-16 DIAGNOSIS — F32A Depression, unspecified: Secondary | ICD-10-CM | POA: Diagnosis not present

## 2020-08-16 DIAGNOSIS — N183 Chronic kidney disease, stage 3 unspecified: Secondary | ICD-10-CM

## 2020-08-16 DIAGNOSIS — N179 Acute kidney failure, unspecified: Secondary | ICD-10-CM | POA: Diagnosis not present

## 2020-08-16 DIAGNOSIS — Z87891 Personal history of nicotine dependence: Secondary | ICD-10-CM | POA: Insufficient documentation

## 2020-08-16 DIAGNOSIS — E785 Hyperlipidemia, unspecified: Secondary | ICD-10-CM | POA: Diagnosis not present

## 2020-08-16 DIAGNOSIS — I130 Hypertensive heart and chronic kidney disease with heart failure and stage 1 through stage 4 chronic kidney disease, or unspecified chronic kidney disease: Secondary | ICD-10-CM | POA: Diagnosis not present

## 2020-08-16 DIAGNOSIS — F419 Anxiety disorder, unspecified: Secondary | ICD-10-CM | POA: Diagnosis not present

## 2020-08-16 DIAGNOSIS — N1832 Chronic kidney disease, stage 3b: Secondary | ICD-10-CM | POA: Diagnosis not present

## 2020-08-16 DIAGNOSIS — I4819 Other persistent atrial fibrillation: Secondary | ICD-10-CM | POA: Insufficient documentation

## 2020-08-16 DIAGNOSIS — Z79899 Other long term (current) drug therapy: Secondary | ICD-10-CM | POA: Insufficient documentation

## 2020-08-16 LAB — BASIC METABOLIC PANEL
Anion gap: 11 (ref 5–15)
BUN: 24 mg/dL — ABNORMAL HIGH (ref 8–23)
CO2: 25 mmol/L (ref 22–32)
Calcium: 9.5 mg/dL (ref 8.9–10.3)
Chloride: 103 mmol/L (ref 98–111)
Creatinine, Ser: 1.24 mg/dL — ABNORMAL HIGH (ref 0.44–1.00)
GFR, Estimated: 46 mL/min — ABNORMAL LOW (ref >60)
Glucose, Bld: 88 mg/dL (ref 70–99)
Potassium: 5 mmol/L (ref 3.5–5.1)
Sodium: 139 mmol/L (ref 135–145)

## 2020-08-16 NOTE — Progress Notes (Signed)
[facility]  PCP: Ann White Primary Cardiologist: Ann White, Ann White  HPI:   Ann White is a 75 y.o. female with a history of presumed non-ischemic cardiomyopathy diagnosed in 04/2017,persistent atrial fibrillation diagnosed in 03/2020,carotid artery stenosis, abdominal aorta ectasia, right calf DVT, hypertension, hyperlipidemia, and CKD 3b.    She is followed by Dr. Saunders White for presumed non ischemic cardiomyopathy.  EF 35-40% and low risk myoview in 04/2017 improved with medical therapy to 55-60% 04/2018.  Was doing well, NYHA class I-II symptoms.  Started to have some increased symptoms went to PCP and diagnosed with afib.  Had DCCV on 05/17/2020.  Started on metoprolol, also taking lisinopril.  She has not had a CHF exacerbation that I am able to find in our records.  She was then referred to EP who evaluated her and set her up for tikosyn initiation and DCCV however she converted just before DCCV.  Discharged on tikosyn and metoprolol/lisinopril was held due to hypotension.    She reports she is doing well since discharge.  She denies any shortness of breath, orthopnea, PND. She had no light headedness or dizziness and has been cleaning up around the house, visited with niece was cooking cleaning without any issues.  She is having some symptoms of dysuria currently she will see her PCP for this.    ROS: All systems negative except as listed in HPI, PMH and Problem List.  SH:  Social History   Socioeconomic History  . Marital status: Married    Spouse name: Ann White  . Number of children: 2  . Years of education: Not on file  . Highest education level: Not on file  Occupational History  . Occupation: retired   Tobacco Use  . Smoking status: Former Smoker    Packs/day: 0.25    Years: 3.00    Pack years: 0.75    Types: Cigarettes    Quit date: 1990    Years since quitting: 32.1  . Smokeless tobacco: Never Used  . Tobacco comment: minimal smoking history   Vaping Use  . Vaping Use: Never used  Substance and Sexual Activity  . Alcohol use: No    Alcohol/week: 0.0 standard drinks  . Drug use: No  . Sexual activity: Never  Other Topics Concern  . Not on file  Social History Narrative   Lives with husband and 2 cats.  2 grown children, 5 grandchildren.   Occupation: retired, worked in [employer]   Activity: no regular exercise.  Does walk in summer   Diet: fruits/vegetables daily, good water.   Social Determinants of Health   Financial Resource Strain: Low Risk   . Difficulty of Paying Living Expenses: Not hard at all  Food Insecurity: No Food Insecurity  . Worried About Charity fundraiser in the Last Year: Never true  . Ran Out of Food in the Last Year: Never true  Transportation Needs: No Transportation Needs  . Lack of Transportation (Medical): No  . Lack of Transportation (Non-Medical): No  Physical Activity: Inactive  . Days of Exercise per Week: 0 days  . Minutes of Exercise per Session: 0 min  Stress: No Stress Concern Present  . Feeling of Stress : Not at all  Social Connections: Not on file  Intimate Partner Violence: Not At Risk  . Fear of Current or Ex-Partner: No  . Emotionally Abused: No  . Physically Abused: No  . Sexually Abused: No    FH:  Family History  Problem Relation  Age of Onset  . CAD Mother 54       MI  . Hypertension Mother   . Parkinson's disease Father   . Diabetes Father   . CAD Father 18       MI  . Hyperlipidemia Sister   . Cancer Neg Hx   . Stroke Neg Hx   . Colon cancer Neg Hx   . Breast cancer Neg Hx   . Colon polyps Neg Hx   . Esophageal cancer Neg Hx   . Stomach cancer Neg Hx   . Rectal cancer Neg Hx     Past Medical History:  Diagnosis Date  . Allergy   . Anxiety   . Arthritis   . Carotid stenosis    a. 02/2016 Carotid U/S: Bilateral 40-59%; b. 03/2017 Carotid U/S: RICA 50-93%, LICA 2-67%; c. 06/4579 Carotid U/S: bilat 40-59% ICA dzs.  . Cervical spondylosis    s/p spine  injections  . CKD (chronic kidney disease), stage III ([place])   . Depression   . Ectatic abdominal aorta ([place]) 2016   rpt [place] 5 yrs  . GERD (gastroesophageal reflux disease)   . History of chicken pox   . History of diverticulitis of colon   . HLD (hyperlipidemia)   . HTN (hypertension)   . Irritable bowel syndrome with constipation   . Lichen sclerosus et atrophicus   . NICM (nonischemic cardiomyopathy) ([place])    a. 04/2017 Echo: EF 35-40%, diff HK; b. 05/2017 MV: EF 56%, no ischemia/infarct; c. 04/2018 Echo: EF 55-60%, no rwma, Gr2 DD; d. 03/2020 Echo: EF 40-45%, nl PASP, mod dil LA, mild to mod dil RA, Triv MR.  . Obesity   . Persistent atrial fibrillation ([place])    a. Dx 03/2020. CHA2DS2VASc = 5-->xarelto 15mg  daily.  . Premature atrial contraction    a. 04/2017 48hr Holter: Predominant rhythm - sinus. Rare PVC's, freq PAC's with freq atrial runs up to 34 beats-->metoprolol started.  . Seasonal allergic rhinitis     Current Outpatient Medications  Medication Sig Dispense Refill  . acetaminophen (TYLENOL) 500 MG tablet Take 500-1,000 mg by mouth every 6 (six) hours as needed (knee pain.).    Marland Kitchen cetirizine (ZYRTEC) 10 MG tablet Take 10 mg by mouth daily.     . Cholecalciferol (VITAMIN D3) 1000 UNITS CAPS Take 1 capsule (1,000 Units total) by mouth daily.    . clonazePAM (KLONOPIN) 1 MG tablet Take 1 tablet by mouth twice daily as needed (Patient taking differently: Take 1 mg by mouth 2 (two) times daily as needed for anxiety.) 60 tablet 0  . docusate sodium (COLACE) 100 MG capsule Take 100-200 mg by mouth daily as needed (constipation.).    Marland Kitchen dofetilide (TIKOSYN) 125 MCG capsule Take 1 capsule (125 mcg total) by mouth 2 (two) times daily. 60 capsule 6  . gabapentin (NEURONTIN) 300 MG capsule Take 300 mg by mouth at bedtime.    . metoprolol succinate (TOPROL XL) 25 MG 24 hr tablet Take 0.5 tablets (12.5 mg total) by mouth daily. 45 tablet 1  . Multiple Vitamin (MULTIVITAMIN WITH MINERALS)  TABS tablet Take 1 tablet by mouth daily.    Marland Kitchen oxymetazoline (AFRIN) 0.05 % nasal spray Place 1 spray into both nostrils 2 (two) times daily as needed for congestion.    . pantoprazole (PROTONIX) 40 MG tablet Take 1 tablet by mouth once daily (Patient taking differently: Take 40 mg by mouth daily.) 90 tablet 2  . Rivaroxaban (XARELTO) 15 MG TABS  tablet Take 1 tablet (15 mg total) by mouth daily with supper. 30 tablet 5  . sertraline (ZOLOFT) 100 MG tablet Take 1 tablet by mouth once daily (Patient taking differently: Take 100 mg by mouth daily.) 90 tablet 3  . traMADol (ULTRAM) 50 MG tablet Take 1 tablet (50 mg total) by mouth 3 (three) times daily as needed (knee pain.). 120 tablet 0  . vitamin B-12 (CYANOCOBALAMIN) 500 MCG tablet Take 500 mcg by mouth daily.      No current facility-administered medications for this visit.    There were no vitals filed for this visit.  PHYSICAL EXAM:  Cardiac: JVD flat, normal rate and rhythm, clear s1 and s2, no murmurs, rubs or gallops, no LE edema Pulmonary: faint inspiratory wheeze bilaterally, no rales or rhonci, not in distress Abdominal: non distended abdomen, soft and nontender Psych: Alert, conversant, in good spirits   ECG   Sinus Bradycardia, normal QT interval  ASSESSMENT & PLAN:   HFrEF, presumed NICM:  -EF 35-40% and low risk myoview in 04/2017 improved with medical therapy to 55-60% 04/2018 -Last ECHO 03/2020 down to 40-45% but newly diagnosed with afib was in NSR on prior studies -Followed by Dr. Saunders White  -chest CT from 2010 with significant atherosclerosis of LAD and LCX -Agree she would benefit from repeat ischemic eval R/LHC at her last visit with him she declined this.  We also discussed this and she declined but said she would think about it -Hopefully decrease in EF was from afib, would repeat an ECHO in around 1-2 months  -Repeat labs today, may be able to add back ARB therapy, careful not to drop bp since she has persistence of  her bradycardia don't think she would tolerate Entresto at this time but may be able to convert in the future.   -no sglt2i with urinary tract symptoms but maybe in the future  Persistent Atrial Fibrillation:  -Diagnosed 03/2020 -Patient is on Xarelto for a CHADS2VASC score of 5 -Underwent DCCV on 05/17/20 but was back in afib on follow up started back on metoprolol and referred to EP -Admitted for Tikosyn initiation on 08/08/20 by EP.  Converted to NSR without need for DCCV, developed bradycardia/ hypotension given IVF, lisinopril and metoprolol held -continue tikosyn, xarelto -bradycardi but asymptomatic, continue holding metoprolol -has afib clinic follow up on 2/3  AKI on CKD 3b: -likely due to transient drop in bp during conversion to NSR during tikosyn initiation admission -BP improved today -recheck bmp today  HLD: -Improved with dietary choices and significant amount of weight loss LDL 65 on 03/2020 -Intolerant to statin and zetia -referred to lipid clinic for possible additional therapies such as PCSK-9i    Follow up afib clinic on 2/3 Follow up with Dr. Marisue Humble 3/25

## 2020-08-16 NOTE — Addendum Note (Signed)
Encounter addended by: Scarlette Calico, RN on: 08/16/2020 4:36 PM  Actions taken: Order list changed, Diagnosis association updated

## 2020-08-16 NOTE — Patient Instructions (Addendum)
Lab work done today, we will call you later with the results and further recommendations on your medications  Your physician has requested that you have an echocardiogram. Echocardiography is a painless test that uses sound waves to create images of your heart. It provides your doctor with information about the size and shape of your heart and how well your heart's chambers and valves are working. This procedure takes approximately one hour. There are no restrictions for this procedure.  Thank you for allowing [place] to provider your heart failure care after your recent discharge. Please follow-up with the A-Fib clinic on Thursday 08/18/20 and with Dr End as scheduled in March

## 2020-08-17 ENCOUNTER — Telehealth (HOSPITAL_COMMUNITY): Payer: Self-pay | Admitting: *Deleted

## 2020-08-17 DIAGNOSIS — I5022 Chronic systolic (congestive) heart failure: Secondary | ICD-10-CM

## 2020-08-17 MED ORDER — LOSARTAN POTASSIUM 25 MG PO TABS: 25.0000 mg | ORAL_TABLET | Freq: Every day | ORAL | 6 refills | Status: DC

## 2020-08-17 NOTE — Telephone Encounter (Signed)
-----   Message from Katherine Roan, MD sent at 08/17/2020 10:35 AM EST ----- Renal function back to baseline.  Will start on Losartan 25mg  starting today.  She will d/c lisinopril in the hopes that she may be able to tolerate entresto in the future.

## 2020-08-17 NOTE — Telephone Encounter (Signed)
Marsh Dolly [place], RN  08/17/2020 4:42 PM EST Back to Top     Pt aware, agreeable, and verbalized understanding, rx sent in, repeat lab sch 2/10   Scarlette Calico, RN  08/17/2020 12:45 PM EST      Per Dr Shan Levans pt also needs repeat Bmet in 1-2 weeks. Attempted to call pt with no answer, will try again later

## 2020-08-18 ENCOUNTER — Encounter (HOSPITAL_COMMUNITY): Payer: Self-pay | Admitting: Physician Assistant

## 2020-08-18 ENCOUNTER — Other Ambulatory Visit: Payer: Self-pay

## 2020-08-18 ENCOUNTER — Ambulatory Visit (HOSPITAL_COMMUNITY)
Admit: 2020-08-18 | Discharge: 2020-08-18 | Disposition: A | Discharge status: Home / Self Care | Payer: Medicare HMO | Source: Ambulatory Visit | Attending: Physician Assistant | Admitting: Physician Assistant

## 2020-08-18 VITALS — BP 142/70 | HR 55 | Ht 63.0 in | Wt 135.6 lb

## 2020-08-18 DIAGNOSIS — Z87891 Personal history of nicotine dependence: Secondary | ICD-10-CM | POA: Diagnosis not present

## 2020-08-18 DIAGNOSIS — D6869 Other thrombophilia: Secondary | ICD-10-CM | POA: Diagnosis not present

## 2020-08-18 DIAGNOSIS — N183 Chronic kidney disease, stage 3 unspecified: Secondary | ICD-10-CM | POA: Diagnosis not present

## 2020-08-18 DIAGNOSIS — Z8249 Family history of ischemic heart disease and other diseases of the circulatory system: Secondary | ICD-10-CM | POA: Insufficient documentation

## 2020-08-18 DIAGNOSIS — I130 Hypertensive heart and chronic kidney disease with heart failure and stage 1 through stage 4 chronic kidney disease, or unspecified chronic kidney disease: Secondary | ICD-10-CM | POA: Diagnosis not present

## 2020-08-18 DIAGNOSIS — Z79899 Other long term (current) drug therapy: Secondary | ICD-10-CM | POA: Diagnosis not present

## 2020-08-18 DIAGNOSIS — Z7901 Long term (current) use of anticoagulants: Secondary | ICD-10-CM | POA: Insufficient documentation

## 2020-08-18 DIAGNOSIS — I5020 Unspecified systolic (congestive) heart failure: Secondary | ICD-10-CM | POA: Diagnosis not present

## 2020-08-18 DIAGNOSIS — Z86718 Personal history of other venous thrombosis and embolism: Secondary | ICD-10-CM | POA: Insufficient documentation

## 2020-08-18 DIAGNOSIS — I4819 Other persistent atrial fibrillation: Secondary | ICD-10-CM | POA: Insufficient documentation

## 2020-08-18 DIAGNOSIS — I428 Other cardiomyopathies: Secondary | ICD-10-CM | POA: Diagnosis not present

## 2020-08-18 DIAGNOSIS — E785 Hyperlipidemia, unspecified: Secondary | ICD-10-CM | POA: Diagnosis not present

## 2020-08-18 LAB — BASIC METABOLIC PANEL
Anion gap: 11 (ref 5–15)
BUN: 19 mg/dL (ref 8–23)
CO2: 24 mmol/L (ref 22–32)
Calcium: 9.4 mg/dL (ref 8.9–10.3)
Chloride: 105 mmol/L (ref 98–111)
Creatinine, Ser: 1.09 mg/dL — ABNORMAL HIGH (ref 0.44–1.00)
GFR, Estimated: 54 mL/min — ABNORMAL LOW (ref >60)
Glucose, Bld: 107 mg/dL — ABNORMAL HIGH (ref 70–99)
Potassium: 4.2 mmol/L (ref 3.5–5.1)
Sodium: 140 mmol/L (ref 135–145)

## 2020-08-18 LAB — MAGNESIUM: Magnesium: 2.3 mg/dL (ref 1.7–2.4)

## 2020-08-18 MED ORDER — DOFETILIDE 125 MCG PO CAPS: 125.0000 ug | ORAL_CAPSULE | Freq: Two times a day (BID) | ORAL | 6 refills | Status: DC

## 2020-08-18 NOTE — Progress Notes (Signed)
Primary Care Physician: Ria Bush, MD Primary Cardiologist: Dr End Primary Electrophysiologist: Dr Quentin Ore Referring Physician: Dr Walker Kehr [name] is a 75 y.o. female with a history of non-ischemic cardiomyopathy diagnosed in 04/2017,persistent atrial fibrillation diagnosed in 03/2020,carotid artery stenosis, abdominal aorta ectasia, right calf DVT, hypertension, hyperlipidemia, and chronic kidney disease who presents for follow up in the [facility]. Patient is on Xarelto for a CHADS2VASC score of 5. She underwent DCCV on 05/17/20 but was back in afib on follow up. She is not very symptomatic with her afib but she does have a NICM on echo 04/14/20. She was referred to Dr Quentin Ore who recommended dofetilide admission.   On follow up today, patient is s/p dofetilide loading 1/24-1/27/22. She converted to SR without DCCV. Patient reports this is "the best I've felt in a long time." She denies any bleeding issues on anticoagulation.   Today, she denies symptoms of palpitations, chest pain, shortness of breath, orthopnea, PND, lower extremity edema, dizziness, presyncope, syncope, snoring, daytime somnolence, bleeding, or neurologic sequela. The patient is tolerating medications without difficulties and is otherwise without complaint today.    Atrial Fibrillation Risk Factors:  she does not have symptoms or diagnosis of sleep apnea. she does not have a history of rheumatic fever.   she has a BMI of Body mass index is 24.02 kg/m.Marland Kitchen Filed Weights   08/18/20 1118  Weight: 61.5 kg    Family History  Problem Relation Age of Onset  . CAD Mother 39       MI  . Hypertension Mother   . Parkinson's disease Father   . Diabetes Father   . CAD Father 19       MI  . Hyperlipidemia Sister   . Cancer Neg Hx   . Stroke Neg Hx   . Colon cancer Neg Hx   . Breast cancer Neg Hx   . Colon polyps Neg Hx   . Esophageal cancer Neg Hx   . Stomach cancer  Neg Hx   . Rectal cancer Neg Hx      Atrial Fibrillation Management history:  Previous antiarrhythmic drugs: dofetilide  Previous cardioversions: 05/17/20 Previous ablations: none CHADS2VASC score: 5 Anticoagulation history: Xarelto   Past Medical History:  Diagnosis Date  . Allergy   . Anxiety   . Arthritis   . Carotid stenosis    a. 02/2016 Carotid U/S: Bilateral 40-59%; b. 03/2017 Carotid U/S: RICA 27-06%, LICA 2-37%; c. 62/8315 Carotid U/S: bilat 40-59% ICA dzs.  . Cervical spondylosis    s/p spine injections  . CKD (chronic kidney disease), stage III ([place])   . Depression   . Ectatic abdominal aorta ([place]) 2016   rpt [place] 5 yrs  . GERD (gastroesophageal reflux disease)   . History of chicken pox   . History of diverticulitis of colon   . HLD (hyperlipidemia)   . HTN (hypertension)   . Irritable bowel syndrome with constipation   . Lichen sclerosus et atrophicus   . NICM (nonischemic cardiomyopathy) ([place])    a. 04/2017 Echo: EF 35-40%, diff HK; b. 05/2017 MV: EF 56%, no ischemia/infarct; c. 04/2018 Echo: EF 55-60%, no rwma, Gr2 DD; d. 03/2020 Echo: EF 40-45%, nl PASP, mod dil LA, mild to mod dil RA, Triv MR.  . Obesity   . Persistent atrial fibrillation ([place])    a. Dx 03/2020. CHA2DS2VASc = 5-->xarelto 15mg  daily.  . Premature atrial contraction    a. 04/2017 48hr Holter:  Predominant rhythm - sinus. Rare PVC's, freq PAC's with freq atrial runs up to 34 beats-->metoprolol started.  . Seasonal allergic rhinitis    Past Surgical History:  Procedure Laterality Date  . bladder tack  1990s  . BREAST BIOPSY Right 03/05/2012    benign  . CARDIOVERSION N/A 05/17/2020   Procedure: CARDIOVERSION;  Surgeon: Nelva Bush, MD;  Location: [facility];  Service: Cardiovascular;  Laterality: N/A;  . CARPAL TUNNEL RELEASE  2004, 2013   bilateral  . CHOLECYSTECTOMY  1990s  . COLONOSCOPY  2002  . COLONOSCOPY  06/2013   mod diverticulosis, 3 tubular adenomas, int hem rpt 5 yrs [facility])   . COLONOSCOPY  06/2018   TA,c olonic angiodysplastic lesion, mod diverticulosis, rpt 5 yrs [facility])  . DEXA  07/2011   normal, T score -0.3  . ESOPHAGOGASTRODUODENOSCOPY ENDOSCOPY  2004   normal [facility])  . TOTAL ABDOMINAL HYSTERECTOMY W/ BILATERAL SALPINGOOPHORECTOMY  1990s   complete, dysmenorrhea and fibroids    Current Outpatient Medications  Medication Sig Dispense Refill  . acetaminophen (TYLENOL) 500 MG tablet Take 500-1,000 mg by mouth every 6 (six) hours as needed (knee pain.).    Marland Kitchen apixaban (ELIQUIS) 5 MG TABS tablet Take 5 mg by mouth 2 (two) times daily.    . cetirizine (ZYRTEC) 10 MG tablet Take 10 mg by mouth daily.     . Cholecalciferol (VITAMIN D3) 1000 UNITS CAPS Take 1 capsule (1,000 Units total) by mouth daily.    . clonazePAM (KLONOPIN) 1 MG tablet Take 0.5 mg by mouth at bedtime.    . docusate sodium (COLACE) 100 MG capsule Take 100-200 mg by mouth daily as needed (constipation.).    Marland Kitchen dofetilide (TIKOSYN) 125 MCG capsule Take 1 capsule (125 mcg total) by mouth 2 (two) times daily. 60 capsule 6  . gabapentin (NEURONTIN) 300 MG capsule Take 300 mg by mouth at bedtime.    Marland Kitchen losartan (COZAAR) 25 MG tablet Take 1 tablet (25 mg total) by mouth daily. 30 tablet 6  . Multiple Vitamin (MULTIVITAMIN WITH MINERALS) TABS tablet Take 1 tablet by mouth daily.    Marland Kitchen oxymetazoline (AFRIN) 0.05 % nasal spray Place 1 spray into both nostrils 2 (two) times daily as needed for congestion.    . pantoprazole (PROTONIX) 40 MG tablet Take 1 tablet by mouth once daily 90 tablet 2  . sertraline (ZOLOFT) 100 MG tablet Take 1 tablet by mouth once daily 90 tablet 3  . traMADol (ULTRAM) 50 MG tablet Take 1 tablet (50 mg total) by mouth 3 (three) times daily as needed (knee pain.). 120 tablet 0  . vitamin B-12 (CYANOCOBALAMIN) 500 MCG tablet Take 500 mcg by mouth daily.      No current facility-administered medications for this encounter.    Allergies  Allergen Reactions  . Sulfa Antibiotics  Swelling    Oral swelling  . Nexletol [Bempedoic Acid]     Dizziness, "eyes felt funny"  . Tricor [Fenofibrate] Other (See Comments)    myalgias  . Zetia [Ezetimibe] Other (See Comments)    Dizziness  . Contrast Media [Iodinated Diagnostic Agents] Other (See Comments)    Oral contrast caused mouth blisters  . Lipitor [Atorvastatin] Other (See Comments)    myalgias  . Pravastatin Other (See Comments)    myalgias    Social History   Socioeconomic History  . Marital status: Married    Spouse name: [name]  . Number of children: 2  . Years of education: Not on file  .  Highest education level: Not on file  Occupational History  . Occupation: retired   Tobacco Use  . Smoking status: Former Smoker    Packs/day: 0.25    Years: 3.00    Pack years: 0.75    Types: Cigarettes    Quit date: 1990    Years since quitting: 32.1  . Smokeless tobacco: Never Used  . Tobacco comment: minimal smoking history  Vaping Use  . Vaping Use: Never used  Substance and Sexual Activity  . Alcohol use: No    Alcohol/week: 0.0 standard drinks  . Drug use: No  . Sexual activity: Never  Other Topics Concern  . Not on file  Social History Narrative   Lives with husband and 2 cats.  2 grown children, 5 grandchildren.   Occupation: retired, worked in [employer]   Activity: no regular exercise.  Does walk in summer   Diet: fruits/vegetables daily, good water.   Social Determinants of Health   Financial Resource Strain: Low Risk   . Difficulty of Paying Living Expenses: Not hard at all  Food Insecurity: No Food Insecurity  . Worried About Charity fundraiser in the Last Year: Never true  . Ran Out of Food in the Last Year: Never true  Transportation Needs: No Transportation Needs  . Lack of Transportation (Medical): No  . Lack of Transportation (Non-Medical): No  Physical Activity: Inactive  . Days of Exercise per Week: 0 days  . Minutes of Exercise per Session: 0 min  Stress: No Stress Concern  Present  . Feeling of Stress : Not at all  Social Connections: Not on file  Intimate Partner Violence: Not At Risk  . Fear of Current or Ex-Partner: No  . Emotionally Abused: No  . Physically Abused: No  . Sexually Abused: No     ROS- All systems are reviewed and negative except as per the HPI above.  Physical Exam: Vitals:   08/18/20 1118  BP: (!) 142/70  Pulse: (!) 55  Weight: 61.5 kg  Height: 5\' 3"  (1.6 m)    GEN- The patient is well appearing, alert and oriented x 3 today.   HEENT-head normocephalic, atraumatic, sclera clear, conjunctiva pink, hearing intact, trachea midline. Lungs- Clear to ausculation bilaterally, normal work of breathing Heart- Regular rate and rhythm, no murmurs, rubs or gallops  GI- soft, NT, ND, + BS Extremities- no clubbing, cyanosis, or edema MS- no significant deformity or atrophy Skin- no rash or lesion Psych- euthymic mood, full affect Neuro- strength and sensation are intact   Wt Readings from Last 3 Encounters:  08/18/20 61.5 kg  08/16/20 60.9 kg  08/10/20 59.4 kg    EKG today demonstrates  SB  Vent. rate 55 BPM PR interval 202 ms QRS duration 82 ms QT/QTc 566/541 ms  (manually calculated ~420 ms)  Echo 04/14/20 demonstrated  1. Left ventricular ejection fraction, by estimation, is 40 to 45%. The  left ventricle has mild to moderately decreased function. The left  ventricle has no regional wall motion abnormalities. Indeterminate  diastolic filling due to E-A fusion.  2. Right ventricular systolic function is normal. The right ventricular  size is normal. There is normal pulmonary artery systolic pressure.  3. Left atrial size was moderately dilated.  4. Right atrial size was mild to moderately dilated.  5. The mitral valve is normal in structure. Trivial mitral valve  regurgitation. No evidence of mitral stenosis.  6. The aortic valve is normal in structure. Aortic valve regurgitation is  not visualized. No aortic  stenosis is present.  7. The inferior vena cava is normal in size with greater than 50%  respiratory variability, suggesting right atrial pressure of 3 mmHg.   Epic records are reviewed at length today  CHA2DS2-VASc Score = 5  The patient's score is based upon: CHF History: Yes HTN History: Yes Diabetes History: No Stroke History: No Vascular Disease History: Yes Age Score: 1 Gender Score: 1      ASSESSMENT AND PLAN: 1. Persistent Atrial Fibrillation (ICD10:  I48.19) The patient's CHA2DS2-VASc score is 5, indicating a 7.2% annual risk of stroke.   S/p dofetilide loading 1/24-1/27/22 Patient appears to be maintaining SR. Continue dofetilide 125 mcg BID. QT stable.  Continue Xarelto 15 mg daily Check bmet/mag today Continue Toprol 12.5 mg daily  2. Secondary Hypercoagulable State (ICD10:  D68.69) The patient is at significant risk for stroke/thromboembolism based upon her CHA2DS2-VASc Score of 5.  Continue Rivaroxaban (Xarelto).   3. Systolic dysfunction NICM No signs or symptoms of fluid overload.  4. HTN Stable, no changes today.   Follow up with Dr Quentin Ore and Dr End as scheduled.    [facility] [address] [place],  [address] [phone] 08/18/2020 11:32 AM

## 2020-08-25 ENCOUNTER — Ambulatory Visit (HOSPITAL_COMMUNITY)
Admission: RE | Admit: 2020-08-25 | Discharge: 2020-08-25 | Disposition: A | Discharge status: Home / Self Care | Payer: Medicare HMO | Source: Ambulatory Visit | Attending: Internal Medicine | Admitting: Internal Medicine

## 2020-08-25 ENCOUNTER — Other Ambulatory Visit: Payer: Self-pay

## 2020-08-25 DIAGNOSIS — I5022 Chronic systolic (congestive) heart failure: Secondary | ICD-10-CM | POA: Insufficient documentation

## 2020-08-25 LAB — BASIC METABOLIC PANEL
Anion gap: 10 (ref 5–15)
BUN: 19 mg/dL (ref 8–23)
CO2: 27 mmol/L (ref 22–32)
Calcium: 9.7 mg/dL (ref 8.9–10.3)
Chloride: 103 mmol/L (ref 98–111)
Creatinine, Ser: 1.22 mg/dL — ABNORMAL HIGH (ref 0.44–1.00)
GFR, Estimated: 47 mL/min — ABNORMAL LOW (ref >60)
Glucose, Bld: 89 mg/dL (ref 70–99)
Potassium: 4.3 mmol/L (ref 3.5–5.1)
Sodium: 140 mmol/L (ref 135–145)

## 2020-09-07 ENCOUNTER — Telehealth: Payer: Self-pay | Admitting: Internal Medicine

## 2020-09-07 NOTE — Telephone Encounter (Signed)
Closing encounter

## 2020-09-20 ENCOUNTER — Encounter: Payer: Self-pay | Admitting: Cardiology

## 2020-09-20 ENCOUNTER — Other Ambulatory Visit: Payer: Self-pay

## 2020-09-20 ENCOUNTER — Ambulatory Visit: Payer: Medicare HMO | Admitting: Cardiology

## 2020-09-20 ENCOUNTER — Ambulatory Visit (HOSPITAL_COMMUNITY): Payer: Medicare HMO | Attending: Cardiovascular Disease

## 2020-09-20 VITALS — BP 138/70 | HR 41 | Ht 63.0 in | Wt 138.4 lb

## 2020-09-20 DIAGNOSIS — G720 Drug-induced myopathy: Secondary | ICD-10-CM

## 2020-09-20 DIAGNOSIS — Z79899 Other long term (current) drug therapy: Secondary | ICD-10-CM

## 2020-09-20 DIAGNOSIS — I428 Other cardiomyopathies: Secondary | ICD-10-CM | POA: Diagnosis not present

## 2020-09-20 DIAGNOSIS — I5022 Chronic systolic (congestive) heart failure: Secondary | ICD-10-CM | POA: Diagnosis not present

## 2020-09-20 DIAGNOSIS — I4819 Other persistent atrial fibrillation: Secondary | ICD-10-CM | POA: Diagnosis not present

## 2020-09-20 DIAGNOSIS — T466X5A Adverse effect of antihyperlipidemic and antiarteriosclerotic drugs, initial encounter: Secondary | ICD-10-CM | POA: Diagnosis not present

## 2020-09-20 LAB — ECHOCARDIOGRAM COMPLETE
Area-P 1/2: 2.34 cm2
Height: 63 in
S' Lateral: 2.9 cm
Weight: 2214.4 oz

## 2020-09-20 NOTE — Progress Notes (Signed)
Electrophysiology Office Follow up Visit Note:    Date:  09/20/2020   ID:  Ann White, DOB 15-Sep-1945, MRN [id number]  PCP:  Ria Bush, MD  [facility] Cardiologist:  Nelva Bush, MD  [facility] Electrophysiologist:  Vickie Epley, MD    Interval History:    Ann White is a 75 y.o. female who presents for a follow up visit.  She is followed by me for persistent atrial fibrillation.  She was admitted for Tikosyn loading January 24 through January 27.  She has done well with this medication and is maintaining normal rhythm.  She has noted off target effects of the medication.  She is doing well on her anticoagulant, Eliquis.  She tells me she feels better in normal rhythm.    Past Medical History:  Diagnosis Date  . Allergy   . Anxiety   . Arthritis   . Carotid stenosis    a. 02/2016 Carotid U/S: Bilateral 40-59%; b. 03/2017 Carotid U/S: RICA 16-38%, LICA 4-66%; c. 59/9357 Carotid U/S: bilat 40-59% ICA dzs.  . Cervical spondylosis    s/p spine injections  . CKD (chronic kidney disease), stage III ([place])   . Depression   . Ectatic abdominal aorta ([place]) 2016   rpt [place] 5 yrs  . GERD (gastroesophageal reflux disease)   . History of chicken pox   . History of diverticulitis of colon   . HLD (hyperlipidemia)   . HTN (hypertension)   . Irritable bowel syndrome with constipation   . Lichen sclerosus et atrophicus   . NICM (nonischemic cardiomyopathy) ([place])    a. 04/2017 Echo: EF 35-40%, diff HK; b. 05/2017 MV: EF 56%, no ischemia/infarct; c. 04/2018 Echo: EF 55-60%, no rwma, Gr2 DD; d. 03/2020 Echo: EF 40-45%, nl PASP, mod dil LA, mild to mod dil RA, Triv MR.  . Obesity   . Persistent atrial fibrillation ([place])    a. Dx 03/2020. CHA2DS2VASc = 5-->xarelto 15mg  daily.  . Premature atrial contraction    a. 04/2017 48hr Holter: Predominant rhythm - sinus. Rare PVC's, freq PAC's with freq atrial runs up to 34 beats-->metoprolol started.  . Seasonal allergic  rhinitis     Past Surgical History:  Procedure Laterality Date  . bladder tack  1990s  . BREAST BIOPSY Right 03/05/2012    benign  . CARDIOVERSION N/A 05/17/2020   Procedure: CARDIOVERSION;  Surgeon: Nelva Bush, MD;  Location: [facility];  Service: Cardiovascular;  Laterality: N/A;  . CARPAL TUNNEL RELEASE  2004, 2013   bilateral  . CHOLECYSTECTOMY  1990s  . COLONOSCOPY  2002  . COLONOSCOPY  06/2013   mod diverticulosis, 3 tubular adenomas, int hem rpt 5 yrs [facility])  . COLONOSCOPY  06/2018   TA,c olonic angiodysplastic lesion, mod diverticulosis, rpt 5 yrs [facility])  . DEXA  07/2011   normal, T score -0.3  . ESOPHAGOGASTRODUODENOSCOPY ENDOSCOPY  2004   normal [facility])  . TOTAL ABDOMINAL HYSTERECTOMY W/ BILATERAL SALPINGOOPHORECTOMY  1990s   complete, dysmenorrhea and fibroids    Current Medications: Current Meds  Medication Sig  . acetaminophen (TYLENOL) 500 MG tablet Take 500-1,000 mg by mouth every 6 (six) hours as needed (knee pain.).  Marland Kitchen apixaban (ELIQUIS) 5 MG TABS tablet Take 5 mg by mouth 2 (two) times daily.  . cetirizine (ZYRTEC) 10 MG tablet Take 10 mg by mouth daily.   . Cholecalciferol (VITAMIN D3) 1000 UNITS CAPS Take 1 capsule (1,000 Units total) by mouth daily.  . clonazePAM (KLONOPIN) 1 MG  tablet Take 0.5 mg by mouth at bedtime.  . docusate sodium (COLACE) 100 MG capsule Take 100-200 mg by mouth daily as needed (constipation.).  Marland Kitchen dofetilide (TIKOSYN) 125 MCG capsule Take 1 capsule (125 mcg total) by mouth 2 (two) times daily.  Marland Kitchen gabapentin (NEURONTIN) 300 MG capsule Take 300 mg by mouth at bedtime.  Marland Kitchen losartan (COZAAR) 25 MG tablet Take 1 tablet (25 mg total) by mouth daily.  . Multiple Vitamin (MULTIVITAMIN WITH MINERALS) TABS tablet Take 1 tablet by mouth daily.  Marland Kitchen oxymetazoline (AFRIN) 0.05 % nasal spray Place 1 spray into both nostrils 2 (two) times daily as needed for congestion.  . pantoprazole (PROTONIX) 40 MG tablet Take 1 tablet by mouth once daily  .  sertraline (ZOLOFT) 100 MG tablet Take 1 tablet by mouth once daily  . traMADol (ULTRAM) 50 MG tablet Take 1 tablet (50 mg total) by mouth 3 (three) times daily as needed (knee pain.).  Marland Kitchen vitamin B-12 (CYANOCOBALAMIN) 500 MCG tablet Take 500 mcg by mouth daily.      Allergies:   Sulfa antibiotics, Nexletol [bempedoic acid], Tricor [fenofibrate], Zetia [ezetimibe], Contrast media [iodinated diagnostic agents], Lipitor [atorvastatin], and Pravastatin   Social History   Socioeconomic History  . Marital status: Married    Spouse name: [name]  . Number of children: 2  . Years of education: Not on file  . Highest education level: Not on file  Occupational History  . Occupation: retired   Tobacco Use  . Smoking status: Former Smoker    Packs/day: 0.25    Years: 3.00    Pack years: 0.75    Types: Cigarettes    Quit date: 1990    Years since quitting: 32.2  . Smokeless tobacco: Never Used  . Tobacco comment: minimal smoking history  Vaping Use  . Vaping Use: Never used  Substance and Sexual Activity  . Alcohol use: No    Alcohol/week: 0.0 standard drinks  . Drug use: No  . Sexual activity: Never  Other Topics Concern  . Not on file  Social History Narrative   Lives with husband and 2 cats.  2 grown children, 5 grandchildren.   Occupation: retired, worked in [employer]   Activity: no regular exercise.  Does walk in summer   Diet: fruits/vegetables daily, good water.   Social Determinants of Health   Financial Resource Strain: Low Risk   . Difficulty of Paying Living Expenses: Not hard at all  Food Insecurity: No Food Insecurity  . Worried About Charity fundraiser in the Last Year: Never true  . Ran Out of Food in the Last Year: Never true  Transportation Needs: No Transportation Needs  . Lack of Transportation (Medical): No  . Lack of Transportation (Non-Medical): No  Physical Activity: Inactive  . Days of Exercise per Week: 0 days  . Minutes of Exercise per Session: 0 min   Stress: No Stress Concern Present  . Feeling of Stress : Not at all  Social Connections: Not on file     Family History: The patient's family history includes CAD (age of onset: 74) in her father; CAD (age of onset: 14) in her mother; Diabetes in her father; Hyperlipidemia in her sister; Hypertension in her mother; Parkinson's disease in her father. There is no history of Cancer, Stroke, Colon cancer, Breast cancer, Colon polyps, Esophageal cancer, Stomach cancer, or Rectal cancer.  ROS:   Please see the history of present illness.    All other systems reviewed and  are negative.  EKGs/Labs/Other Studies Reviewed:    The following studies were reviewed today:   EKG:  The ekg ordered today demonstrates sinus rhythm.  QTC is 391 ms.  Recent Labs: 03/25/2020: ALT 10 03/30/2020: TSH 1.40 05/05/2020: Hemoglobin 10.7; Platelets 174 08/18/2020: Magnesium 2.3 08/25/2020: BUN 19; Creatinine, Ser 1.22; Potassium 4.3; Sodium 140  Recent Lipid Panel    Component Value Date/Time   CHOL 150 03/25/2020 0838   CHOL 191 09/14/2019 1019   TRIG 191.0 (H) 03/25/2020 0838   HDL 47.30 03/25/2020 0838   HDL 59 09/14/2019 1019   CHOLHDL 3 03/25/2020 0838   VLDL 38.2 03/25/2020 0838   LDLCALC 65 03/25/2020 0838   LDLCALC 107 (H) 09/14/2019 1019   LDLDIRECT 132.0 03/18/2019 0916    Physical Exam:    VS:  BP 138/70   Pulse (!) 41   Ht 5\' 3"  (1.6 m)   Wt 138 lb 6.4 oz (62.8 kg)   SpO2 93%   BMI 24.52 kg/m     Wt Readings from Last 3 Encounters:  09/20/20 138 lb 6.4 oz (62.8 kg)  08/18/20 135 lb 9.6 oz (61.5 kg)  08/16/20 134 lb 4 oz (60.9 kg)     GEN:  Well nourished, well developed in no acute distress HEENT: Normal NECK: No JVD; No carotid bruits LYMPHATICS: No lymphadenopathy CARDIAC: RRR, no murmurs, rubs, gallops RESPIRATORY:  Clear to auscultation without rales, wheezing or rhonchi  ABDOMEN: Soft, non-tender, non-distended MUSCULOSKELETAL:  No edema; No deformity  SKIN: Warm and  dry NEUROLOGIC:  Alert and oriented x 3 PSYCHIATRIC:  Normal affect   ASSESSMENT:    1. Persistent atrial fibrillation ([place])   2. Nonischemic cardiomyopathy ([place])   3. Encounter for long-term (current) use of high-risk medication    PLAN:    In order of problems listed above:  1. Persistent atrial fibrillation Maintaining sinus rhythm on dofetilide 125 mcg twice daily.  On Eliquis 5 mg twice daily for stroke prophylaxis. We will plan to repeat chemistry panel in 3 months with an appointment. If doing well at that appointment, can likely space follow-up appointments to every 6 months.  2.  Nonischemic cardiomyopathy.  Chronic systolic heart failure. NYHA class II.  Warm and dry. Likely related to her atrial arrhythmias in the past.  Plan to repeat echocardiogram today to reassess her left ventricular function. Continue losartan.  3.  High risk medication monitoring Recent blood work stable.  QTC normal on today's EKG.  Follow-up 3 months.   Medication Adjustments/Labs and Tests Ordered: Current medicines are reviewed at length with the patient today.  Concerns regarding medicines are outlined above.  Orders Placed This Encounter  Procedures  . EKG 12-Lead   No orders of the defined types were placed in this encounter.    Signed, Lars Mage, MD, [facility]  09/20/2020 9:17 AM    Electrophysiology [place]

## 2020-09-20 NOTE — Patient Instructions (Addendum)
Medication Instructions:  Your physician recommends that you continue on your current medications as directed. Please refer to the Current Medication list given to you today.  Labwork: None ordered.  Testing/Procedures: None ordered.  Follow-Up: Your physician wants you to follow-up in: 3 months with Dr. Lambert.     Any Other Special Instructions Will Be Listed Below (If Applicable).  If you need a refill on your cardiac medications before your next appointment, please call your pharmacy.   

## 2020-10-05 ENCOUNTER — Telehealth (HOSPITAL_COMMUNITY): Payer: Self-pay | Admitting: Internal Medicine

## 2020-10-05 NOTE — Telephone Encounter (Signed)
Went over ECHO results with Mrs. Corinna Capra.  EF now 60-65% in NSR as compared to 40-45% in September while in afib.

## 2020-10-07 ENCOUNTER — Ambulatory Visit: Payer: Medicare HMO | Admitting: Internal Medicine

## 2020-10-07 ENCOUNTER — Other Ambulatory Visit: Payer: Self-pay

## 2020-10-07 ENCOUNTER — Encounter: Payer: Self-pay | Admitting: Internal Medicine

## 2020-10-07 VITALS — BP 110/58 | HR 49 | Ht 63.0 in | Wt 136.5 lb

## 2020-10-07 DIAGNOSIS — E785 Hyperlipidemia, unspecified: Secondary | ICD-10-CM | POA: Diagnosis not present

## 2020-10-07 DIAGNOSIS — I5022 Chronic systolic (congestive) heart failure: Secondary | ICD-10-CM | POA: Diagnosis not present

## 2020-10-07 DIAGNOSIS — I4819 Other persistent atrial fibrillation: Secondary | ICD-10-CM | POA: Diagnosis not present

## 2020-10-07 DIAGNOSIS — I10 Essential (primary) hypertension: Secondary | ICD-10-CM | POA: Diagnosis not present

## 2020-10-07 DIAGNOSIS — I6523 Occlusion and stenosis of bilateral carotid arteries: Secondary | ICD-10-CM | POA: Diagnosis not present

## 2020-10-07 DIAGNOSIS — I428 Other cardiomyopathies: Secondary | ICD-10-CM

## 2020-10-07 NOTE — Patient Instructions (Signed)
Medication Instructions:  Your physician recommends that you continue on your current medications as directed. Please refer to the Current Medication list given to you today.  *If you need a refill on your cardiac medications before your next appointment, please call your pharmacy*   Lab Work: None ordered If you have labs (blood work) drawn today and your tests are completely normal, you will receive your results only by: . MyChart Message (if you have MyChart) OR . A paper copy in the mail If you have any lab test that is abnormal or we need to change your treatment, we will call you to review the results.   Testing/Procedures: None ordered   Follow-Up: At [facility], you and your health needs are our priority.  As part of our continuing mission to provide you with exceptional heart care, we have created designated Provider Care Teams.  These Care Teams include your primary Cardiologist (physician) and Advanced Practice Providers (APPs -  Physician Assistants and Nurse Practitioners) who all work together to provide you with the care you need, when you need it.  We recommend signing up for the patient portal called "MyChart".  Sign up information is provided on this After Visit Summary.  MyChart is used to connect with patients for Virtual Visits (Telemedicine).  Patients are able to view lab/test results, encounter notes, upcoming appointments, etc.  Non-urgent messages can be sent to your provider as well.   To learn more about what you can do with MyChart, go to [email or web address].    Your next appointment:   6 month(s)  The format for your next appointment:   In Person  Provider:   You may see Christopher End, MD or one of the following Advanced Practice Providers on your designated Care Team:    Christopher Berge, NP  Ryan Dunn, PA-C  Jacquelyn Visser, PA-C  Cadence Furth, PA-C  Caitlin Walker, NP    Other Instructions N/A  

## 2020-10-07 NOTE — Progress Notes (Signed)
Follow-up Outpatient Visit Date: 10/07/2020  Primary Care Provider: Ria Bush, MD [place] [address]  Chief Complaint: Follow-up atrial fibrillation and heart failure  HPI:  Ann White is a 75 y.o. female with history of non-ischemic cardiomyopathy diagnosed in 04/2017 (MPI without perfusion defectsLVEF as low as 35-40% but normalized on repeat echo in 04/2018),persistent atrial fibrillation diagnosed in 03/2020,carotid artery stenosis, abdominal aorta ectasia, right calf DVT, hypertension, hyperlipidemia, and chronic kidney disease, who presents for follow-up of cardiomyopathy and atrial fibrillation.  I last saw her in 06/2020, which time Ann White was feeling relatively well.  She remained concerned about cost of NOAC therapy and dofetilide she ultimately underwent initiation of dofetilide in late January under the direction of Dr. Quentin Ore.  She reported feeling better with maintenance of sinus rhythm when she followed up with him earlier this month.  Repeat echo on 09/20/2020 showed normalization of LVEF (60-65%).  Today, Ann White reports that she is feeling well.  She has not had any chest pain, shortness of breath, palpitations, lightheadedness, or edema.  She is tolerating Tikosyn and Eliquis well.  Neither medication is cost prohibitive at this time.  Unfortunately, she was not able to receive assistance with Repatha and is no longer able to take it.  --------------------------------------------------------------------------------------------------  Cardiovascular History & Procedures: Cardiovascular Problems:  Persistent atrial fibrillation  Nonischemic cardiomyopathy  Carotid artery stenosis  Risk Factors:  Hypertension, hyperlipidemia, and age greater than 44  Cath/PCI:  None  CV Surgery:  None  EP Procedures and Devices:  DCCV (05/17/20)  48 hour Holter monitor (04/29/17): Sinus rhythm with frequent PACs and atrial runs, lasting  up to 34 beats.  Non-Invasive Evaluation(s):  TTE (09/20/2020): Normal LV size wall thickness.  LVEF 60-65% with normal wall motion.  Grade 1 diastolic dysfunction.  Normal RV size and function.  Severe left and mild to moderate right atrial enlargement.  Trivial mitral and mild tricuspid regurgitation.  Carotid Doppler (08/15/2020): 40-59% stenoses in both carotid arteries.  Greater than 50% stenosis of right external carotid artery.  Antegrade vertebral artery flow.  TTE (04/14/2020): Normal LV size and wall thickness.  LVEF 40-45% with global hypokinesis.  Normal RV size and function.  Moderate left atrial and mild to moderate right atrial enlargement.  Trivial mitral and mild tricuspid regurgitation.  Carotid Doppler (04/17/2019): 40-59% stenosis in the carotid arteries bilaterally. Antegrade flow in the vertebral arteries. Normal subclavian flow.  TTE (04/24/18): Normal LV size. LVEF 55-60% with normal wall motion. Grade 2 diastolic dysfunction. Mild left atrial enlargement. Normal RV size and function. Mild pulmonary hypertension (PASP 41 mmHg).  Exercise MPI (06/12/17): No evidence of ischemia. LVEF 56%. Hypertensive blood pressure response to exercise.  Echo (04/29/17): Normal LV size with moderately reduced contraction. LVEF 35-40% with global hypokinesis. Mild MR. Mild left atrial enlargement. Normal RV size and function. Normal PA pressure.  Carotid Doppler (03/25/17):Heterogeneous plaque, bilaterally. Stable, 40-59% RICA stenosis. GLOVFI,4-33% LICA stenosis. Stable, >50% RECA stenosis. Patent vertebral arteries with antegrade flow. Normal subclavian arteries, bilaterally.   Recent CV Pertinent Labs: Lab Results  Component Value Date   CHOL 150 03/25/2020   CHOL 191 09/14/2019   HDL 47.30 03/25/2020   HDL 59 09/14/2019   LDLCALC 65 03/25/2020   LDLCALC 107 (H) 09/14/2019   LDLDIRECT 132.0 03/18/2019   TRIG 191.0 (H) 03/25/2020   CHOLHDL 3 03/25/2020   INR 1.5 09/22/2015    INR 7.0 Repeated and verified X2. [facility]) 06/13/2015  K 4.3 08/25/2020   MG 2.3 08/18/2020   BUN 19 08/25/2020   BUN 26 05/05/2020   CREATININE 1.22 (H) 08/25/2020   CREATININE 1.19 07/26/2011    Past medical and surgical history were reviewed and updated in EPIC.  Current Meds  Medication Sig   acetaminophen (TYLENOL) 500 MG tablet Take 500-1,000 mg by mouth every 6 (six) hours as needed (knee pain.).   apixaban (ELIQUIS) 5 MG TABS tablet Take 5 mg by mouth 2 (two) times daily.   cetirizine (ZYRTEC) 10 MG tablet Take 10 mg by mouth daily.    Cholecalciferol (VITAMIN D3) 1000 UNITS CAPS Take 1 capsule (1,000 Units total) by mouth daily.   clonazePAM (KLONOPIN) 1 MG tablet Take 0.5 mg by mouth at bedtime.   docusate sodium (COLACE) 100 MG capsule Take 100-200 mg by mouth daily as needed (constipation.).   dofetilide (TIKOSYN) 125 MCG capsule Take 1 capsule (125 mcg total) by mouth 2 (two) times daily.   gabapentin (NEURONTIN) 300 MG capsule Take 300 mg by mouth at bedtime.   losartan (COZAAR) 25 MG tablet Take 1 tablet (25 mg total) by mouth daily.   metoprolol succinate (TOPROL-XL) 25 MG 24 hr tablet Take 12.5 mg by mouth daily.   Multiple Vitamin (MULTIVITAMIN WITH MINERALS) TABS tablet Take 1 tablet by mouth daily.   oxymetazoline (AFRIN) 0.05 % nasal spray Place 1 spray into both nostrils 2 (two) times daily as needed for congestion.   pantoprazole (PROTONIX) 40 MG tablet Take 1 tablet by mouth once daily   sertraline (ZOLOFT) 100 MG tablet Take 1 tablet by mouth once daily   traMADol (ULTRAM) 50 MG tablet Take 1 tablet (50 mg total) by mouth 3 (three) times daily as needed (knee pain.).   vitamin B-12 (CYANOCOBALAMIN) 500 MCG tablet Take 500 mcg by mouth daily.     Allergies: Sulfa antibiotics, Nexletol [bempedoic acid], Tricor [fenofibrate], Zetia [ezetimibe], Contrast media [iodinated diagnostic agents], Lipitor [atorvastatin], and Pravastatin  Social History    Tobacco Use   Smoking status: Former Smoker    Packs/day: 0.25    Years: 3.00    Pack years: 0.75    Types: Cigarettes    Quit date: 1990    Years since quitting: 32.2   Smokeless tobacco: Never Used   Tobacco comment: minimal smoking history  Vaping Use   Vaping Use: Never used  Substance Use Topics   Alcohol use: No    Alcohol/week: 0.0 standard drinks   Drug use: No    Family History  Problem Relation Age of Onset   CAD Mother 51       MI   Hypertension Mother    Parkinson's disease Father    Diabetes Father    CAD Father 35       MI   Hyperlipidemia Sister    Cancer Neg Hx    Stroke Neg Hx    Colon cancer Neg Hx    Breast cancer Neg Hx    Colon polyps Neg Hx    Esophageal cancer Neg Hx    Stomach cancer Neg Hx    Rectal cancer Neg Hx     Review of Systems: A 12-system review of systems was performed and was negative except as noted in the HPI.  --------------------------------------------------------------------------------------------------  Physical Exam: BP (!) 110/58 (BP Location: Left Arm, Patient Position: Sitting, Cuff Size: Normal)    Pulse (!) 49    Ht 5\' 3"  (1.6 m)    Wt 136 lb 8  oz (61.9 kg)    SpO2 98%    BMI 24.18 kg/m   Ambulating heart rate increased to 79 bpm.  General:  NAD. Neck: No JVD or HJR. Lungs: Clear to auscultation bilaterally without wheezes or crackles. Heart: Bradycardic but regular without murmurs, rubs, or gallops. Abdomen: Soft, nontender, nondistended. Extremities: No lower extremity edema.  EKG: Sinus bradycardia (heart rate 49 bpm).  QTc 411 ms.  Otherwise, no significant abnormality.  Lab Results  Component Value Date   WBC 3.5 05/05/2020   HGB 10.7 (L) 05/05/2020   HCT 33.3 (L) 05/05/2020   MCV 89 05/05/2020   PLT 174 05/05/2020    Lab Results  Component Value Date   NA 140 08/25/2020   K 4.3 08/25/2020   CL 103 08/25/2020   CO2 27 08/25/2020   BUN 19 08/25/2020   CREATININE 1.22  (H) 08/25/2020   GLUCOSE 89 08/25/2020   ALT 10 03/25/2020    Lab Results  Component Value Date   CHOL 150 03/25/2020   HDL 47.30 03/25/2020   LDLCALC 65 03/25/2020   LDLDIRECT 132.0 03/18/2019   TRIG 191.0 (H) 03/25/2020   CHOLHDL 3 03/25/2020    --------------------------------------------------------------------------------------------------  ASSESSMENT AND PLAN: Persistent atrial fibrillation: Ann White is maintaining sinus rhythm following initiation of dofetilide.  We will plan to continue current doses of dofetilide and apixaban under the direction of Dr. Quentin Ore.  QTc appropriate today.  Chronic HFrEF with recovered ejection fraction due to nonischemic cardiomyopathy: Echocardiogram last week showed normalization of LVEF following maintenance of sinus rhythm.  She is bradycardic today but asymptomatic.  We will therefore continue with metoprolol succinate 12.5 mg daily.  We will also continue with losartan 25 mg daily.  No plans for ischemia evaluation given lack of symptoms, normalization of LVEF, and prior nonischemic myocardial perfusion stress test.  Carotid artery stenosis and hyperlipidemia: Moderate bilateral carotid artery stenosis again seen on carotid Doppler in January.  Ann White has been intolerant of multiple statins and was previously doing well with Evalose and Mab.  Unfortunately, she did not receive medication assistance this year and has been unable to afford the prescription.  I will reach out to our pharmacy team to see what options are available for helping her procure this medication or trying an alternative therapy.  Hypertension: Blood pressure well controlled today.  Continue current regimen.  Follow-up: Return to clinic in 6 months.  Nelva Bush, MD 10/07/2020 2:47 PM

## 2020-10-08 ENCOUNTER — Encounter: Payer: Self-pay | Admitting: Internal Medicine

## 2020-10-10 ENCOUNTER — Telehealth: Payer: Self-pay | Admitting: Pharmacist

## 2020-10-10 MED ORDER — REPATHA SURECLICK 140 MG/ML ~~LOC~~ SOAJ: 1.0000 "pen " | SUBCUTANEOUS | 3 refills | Status: DC

## 2020-10-10 NOTE — Telephone Encounter (Signed)
Thank you for your assistance.  I am glad that she will be able to restart Repatha.  Nelva Bush, MD [facility]

## 2020-10-10 NOTE — Telephone Encounter (Signed)
Called pt and was able to get her healthwell grand renewed now that the fund is reopened  Pharmacy Card Id [id number] Group [id number] PCN [id number] BIN [id number]  PA attempted, but not needed.  Info called into [facility]. Pt made aware and was very appreciative of the help.

## 2020-10-30 ENCOUNTER — Other Ambulatory Visit: Payer: Self-pay | Admitting: Family Medicine

## 2020-10-31 ENCOUNTER — Other Ambulatory Visit: Payer: Self-pay | Admitting: Family Medicine

## 2020-10-31 DIAGNOSIS — Z1231 Encounter for screening mammogram for malignant neoplasm of breast: Secondary | ICD-10-CM

## 2020-10-31 NOTE — Telephone Encounter (Signed)
Refill request Tramadol Last refill 06/06/20 #120 Last office visit 06/15/20 acute No upcoming appointment scheduled

## 2020-11-02 NOTE — Telephone Encounter (Signed)
ERx 

## 2020-12-12 ENCOUNTER — Other Ambulatory Visit: Payer: Self-pay | Admitting: Family Medicine

## 2020-12-13 NOTE — Progress Notes (Signed)
Ann Quirk T. Alexio Sroka, MD, [name]  Primary Care and Sports Medicine [facility] at [place], [address]  Phone: [phone]  FAX: [name] - 75 y.o. female  MRN [id number]  Date of Birth: 01-24-1946  Date: 12/14/2020  PCP: Ria Bush, MD  Referral: Ria Bush, MD  Chief Complaint  Patient presents with  . joint injection    Right knee   . Urinary Tract Infection    This visit occurred during the SARS-CoV-2 public health emergency.  Safety protocols were in place, including screening questions prior to the visit, additional usage of staff PPE, and extensive cleaning of exam room while observing appropriate contact time as indicated for disinfecting solutions.   Subjective:   Ann White is a 75 y.o. very pleasant female patient with Body mass index is 24.62 kg/m. who presents with the following:  She is here to talk about R knee pain, and she had moderate R knee OA on a 2017 WB knee film.  This is been waxing and waning over time  She also has a question about a possible UTI: She was having some pain and dysuria last week.   L L Q was urinating a lot - UA is clear.   R knee is bothering her and flaring up.   Review of Systems is noted in the HPI, as appropriate   Objective:   BP 124/70   Pulse (!) 55   Temp 97.8 F (36.6 C) (Temporal)   Ht 5\' 3"  (1.6 m)   Wt 139 lb (63 kg)   SpO2 97%   BMI 24.62 kg/m   Right knee: She has full extension and able to flex her knee to 105 degrees.  She does have a very small prepatellar bursal swelling, and she does have medial and lateral joint line tenderness with stable MCL and LCL.  ACL and PCL are also intact.  Strength is 5/5  Radiology: Results for orders placed or performed in visit on 12/14/20  POCT urinalysis dipstick  Result Value Ref Range   Color, UA yellow yellow   Clarity, UA clear clear   Glucose, UA  negative negative mg/dL   Bilirubin, UA negative negative   Ketones, POC UA negative negative mg/dL   Spec Grav, UA 1.020 1.010 - 1.025   Blood, UA negative negative   pH, UA 5.5 5.0 - 8.0   Protein Ur, POC negative negative mg/dL   Urobilinogen, UA 0.2 0.2 or 1.0 E.U./dL   Nitrite, UA Negative Negative   Leukocytes, UA Negative Negative     Assessment and Plan:     ICD-10-CM   1. Primary osteoarthritis of right knee  M17.11 triamcinolone acetonide (KENALOG-40) injection 40 mg  2. Urination frequency  R35.0 POCT urinalysis dipstick    Urine Culture   Acute on chronic right knee osteoarthritis exacerbation.  Continue with conservative care, and also going to do a therapeutic steroid injection today.  UA appeared normal, but culture to be sure the patient does not have a UTI.  Meds ordered this encounter  Medications  . clonazePAM (KLONOPIN) 1 MG tablet    Sig: Take 0.5 tablets (0.5 mg total) by mouth at bedtime.    Dispense:  30 tablet    Refill:  0  . triamcinolone acetonide (KENALOG-40) injection 40 mg   Medications Discontinued During This Encounter  Medication Reason  . clonazePAM (KLONOPIN) 1 MG tablet Reorder   Orders  Placed This Encounter  Procedures  . Urine Culture  . POCT urinalysis dipstick    Follow-up: No follow-ups on file.  Signed,  [name]. Nosson Wender, MD   Outpatient Encounter Medications as of 12/14/2020  Medication Sig  . acetaminophen (TYLENOL) 500 MG tablet Take 500-1,000 mg by mouth every 6 (six) hours as needed (knee pain.).  Marland Kitchen apixaban (ELIQUIS) 5 MG TABS tablet Take 5 mg by mouth 2 (two) times daily.  . cetirizine (ZYRTEC) 10 MG tablet Take 10 mg by mouth daily.   . Cholecalciferol (VITAMIN D3) 1000 UNITS CAPS Take 1 capsule (1,000 Units total) by mouth daily.  Marland Kitchen docusate sodium (COLACE) 100 MG capsule Take 100-200 mg by mouth daily as needed (constipation.).  Marland Kitchen dofetilide (TIKOSYN) 125 MCG capsule Take 1 capsule (125 mcg total) by mouth 2  (two) times daily.  . Evolocumab (REPATHA SURECLICK) 748 MG/ML SOAJ Inject 1 pen into the skin every 14 (fourteen) days.  Marland Kitchen gabapentin (NEURONTIN) 300 MG capsule Take 300 mg by mouth at bedtime.  Marland Kitchen losartan (COZAAR) 25 MG tablet Take 1 tablet (25 mg total) by mouth daily.  . metoprolol succinate (TOPROL-XL) 25 MG 24 hr tablet Take 12.5 mg by mouth daily.  . Multiple Vitamin (MULTIVITAMIN WITH MINERALS) TABS tablet Take 1 tablet by mouth daily.  Marland Kitchen oxymetazoline (AFRIN) 0.05 % nasal spray Place 1 spray into both nostrils 2 (two) times daily as needed for congestion.  . pantoprazole (PROTONIX) 40 MG tablet Take 1 tablet by mouth once daily  . sertraline (ZOLOFT) 100 MG tablet Take 1 tablet by mouth once daily  . traMADol (ULTRAM) 50 MG tablet TAKE 1 TABLET BY MOUTH THREE TIMES DAILY AS NEEDED FOR  KNEE  PAIN  . vitamin B-12 (CYANOCOBALAMIN) 500 MCG tablet Take 500 mcg by mouth daily.   . [DISCONTINUED] clonazePAM (KLONOPIN) 1 MG tablet Take 0.5 mg by mouth at bedtime.  . clonazePAM (KLONOPIN) 1 MG tablet Take 0.5 tablets (0.5 mg total) by mouth at bedtime.  . [EXPIRED] triamcinolone acetonide (KENALOG-40) injection 40 mg    No facility-administered encounter medications on file as of 12/14/2020.

## 2020-12-13 NOTE — Telephone Encounter (Signed)
Name of Medication: Clonazepam Name of Pharmacy: [facility] or Written Date and Quantity: 03/30/20, #60 Last Office Visit and Type: 03/30/20, AWV prt 2 Next Office Visit and Type: none Last Controlled Substance Agreement Date: 03/16/15 Last UDS: 03/16/15

## 2020-12-14 ENCOUNTER — Encounter: Payer: Self-pay | Admitting: Family Medicine

## 2020-12-14 ENCOUNTER — Other Ambulatory Visit: Payer: Self-pay | Admitting: Family Medicine

## 2020-12-14 ENCOUNTER — Ambulatory Visit (INDEPENDENT_AMBULATORY_CARE_PROVIDER_SITE_OTHER): Payer: Medicare HMO | Admitting: Family Medicine

## 2020-12-14 ENCOUNTER — Other Ambulatory Visit: Payer: Self-pay

## 2020-12-14 VITALS — BP 124/70 | HR 55 | Temp 97.8°F | Ht 63.0 in | Wt 139.0 lb

## 2020-12-14 DIAGNOSIS — M1711 Unilateral primary osteoarthritis, right knee: Secondary | ICD-10-CM | POA: Diagnosis not present

## 2020-12-14 DIAGNOSIS — R350 Frequency of micturition: Secondary | ICD-10-CM

## 2020-12-14 LAB — POCT URINALYSIS DIP (MANUAL ENTRY)
Bilirubin, UA: NEGATIVE
Blood, UA: NEGATIVE
Glucose, UA: NEGATIVE mg/dL
Ketones, POC UA: NEGATIVE mg/dL
Leukocytes, UA: NEGATIVE
Nitrite, UA: NEGATIVE
Protein Ur, POC: NEGATIVE mg/dL
Spec Grav, UA: 1.02 (ref 1.010–1.025)
Urobilinogen, UA: 0.2 E.U./dL
pH, UA: 5.5 (ref 5.0–8.0)

## 2020-12-14 MED ORDER — CLONAZEPAM 1 MG PO TABS: 0.5000 mg | ORAL_TABLET | Freq: Every day | ORAL | 0 refills | Status: DC

## 2020-12-14 MED ORDER — TRIAMCINOLONE ACETONIDE 40 MG/ML IJ SUSP
40.0000 mg | Freq: Once | INTRAMUSCULAR | Status: AC
  Administered 2020-12-14: 40 mg via INTRA_ARTICULAR

## 2020-12-15 NOTE — Telephone Encounter (Signed)
Duplicate request

## 2020-12-16 LAB — URINE CULTURE
MICRO NUMBER:: 11956059
Result:: NO GROWTH
SPECIMEN QUALITY:: ADEQUATE

## 2020-12-16 NOTE — Telephone Encounter (Signed)
ERx 

## 2020-12-21 ENCOUNTER — Ambulatory Visit
Admission: RE | Admit: 2020-12-21 | Discharge: 2020-12-21 | Disposition: A | Discharge status: Home / Self Care | Payer: Medicare HMO | Source: Ambulatory Visit | Attending: Family Medicine | Admitting: Family Medicine

## 2020-12-21 ENCOUNTER — Other Ambulatory Visit: Payer: Self-pay

## 2020-12-21 DIAGNOSIS — Z1231 Encounter for screening mammogram for malignant neoplasm of breast: Secondary | ICD-10-CM

## 2020-12-22 ENCOUNTER — Ambulatory Visit: Payer: Medicare HMO | Admitting: Cardiology

## 2020-12-26 ENCOUNTER — Other Ambulatory Visit: Payer: Self-pay | Admitting: Internal Medicine

## 2020-12-27 NOTE — Telephone Encounter (Signed)
Please advise if ok to refill historical provider medication. 

## 2020-12-29 NOTE — Progress Notes (Signed)
Electrophysiology Office Follow up Visit Note:    Date:  12/30/2020   ID:  Ann White, DOB 1946/02/25, MRN [id number]  PCP:  Ria Bush, MD  [facility] Cardiologist:  Nelva Bush, MD  [facility] Electrophysiologist:  Vickie Epley, MD    Interval History:    Ann White is a 75 y.o. female who presents for a follow up visit for her persistent atrial fibrillation.  I last saw the patient September 20, 2020.  She is maintained on Tikosyn and has done well on that therapy.  She is with her husband today in clinic will have previously met.  She tells me she has been doing very well.  She is very happy with the results of being on Tikosyn.  No trouble bleeding on Eliquis.   Past Medical History:  Diagnosis Date   Allergy    Anxiety    Arthritis    Carotid stenosis    a. 02/2016 Carotid U/S: Bilateral 40-59%; b. 03/2017 Carotid U/S: RICA 58-09%, LICA 9-83%; c. 38/2505 Carotid U/S: bilat 40-59% ICA dzs.   Cervical spondylosis    s/p spine injections   CKD (chronic kidney disease), stage III (HCC)    Depression    Ectatic abdominal aorta ([place]) 2016   rpt [place] 5 yrs   GERD (gastroesophageal reflux disease)    History of chicken pox    History of diverticulitis of colon    HLD (hyperlipidemia)    HTN (hypertension)    Irritable bowel syndrome with constipation    Lichen sclerosus et atrophicus    NICM (nonischemic cardiomyopathy) ([place])    a. 04/2017 Echo: EF 35-40%, diff HK; b. 05/2017 MV: EF 56%, no ischemia/infarct; c. 04/2018 Echo: EF 55-60%, no rwma, Gr2 DD; d. 03/2020 Echo: EF 40-45%, nl PASP, mod dil LA, mild to mod dil RA, Triv MR.   Obesity    Persistent atrial fibrillation ([place])    a. Dx 03/2020. CHA2DS2VASc = 5-->xarelto 62m daily.   Premature atrial contraction    a. 04/2017 48hr Holter: Predominant rhythm - sinus. Rare PVC's, freq PAC's with freq atrial runs up to 34 beats-->metoprolol started.   Seasonal allergic rhinitis     Past  Surgical History:  Procedure Laterality Date   bladder tack  1990s   BREAST BIOPSY Right 03/05/2012    benign   CARDIOVERSION N/A 05/17/2020   Procedure: CARDIOVERSION;  Surgeon: ENelva Bush MD;  Location: [facility] ORS;  Service: Cardiovascular;  Laterality: N/A;   CARPAL TUNNEL RELEASE  2004, 2013   bilateral   CHOLECYSTECTOMY  1990s   COLONOSCOPY  2002   COLONOSCOPY  06/2013   mod diverticulosis, 3 tubular adenomas, int hem rpt 5 yrs ([place] Plan   COLONOSCOPY  06/2018   TA,c olonic angiodysplastic lesion, mod diverticulosis, rpt 5 yrs ([place] Plan   DEXA  07/2011   normal, T score -0.3   ESOPHAGOGASTRODUODENOSCOPY ENDOSCOPY  2004   normal ([place])   TOTAL ABDOMINAL HYSTERECTOMY W/ BILATERAL SALPINGOOPHORECTOMY  1990s   complete, dysmenorrhea and fibroids    Current Medications: Current Meds  Medication Sig   acetaminophen (TYLENOL) 500 MG tablet Take 500-1,000 mg by mouth every 6 (six) hours as needed (knee pain.).   apixaban (ELIQUIS) 5 MG TABS tablet Take 5 mg by mouth 2 (two) times daily.   cetirizine (ZYRTEC) 10 MG tablet Take 10 mg by mouth daily.    Cholecalciferol (VITAMIN D3) 1000 UNITS CAPS Take 1 capsule (1,000 Units total) by mouth daily.   clonazePAM (  KLONOPIN) 1 MG tablet Take 1 tablet by mouth twice daily as needed   docusate sodium (COLACE) 100 MG capsule Take 100-200 mg by mouth daily as needed (constipation.).   dofetilide (TIKOSYN) 125 MCG capsule Take 1 capsule (125 mcg total) by mouth 2 (two) times daily.   Evolocumab (REPATHA SURECLICK) 825 MG/ML SOAJ Inject 1 pen into the skin every 14 (fourteen) days.   gabapentin (NEURONTIN) 300 MG capsule Take 300 mg by mouth at bedtime.   losartan (COZAAR) 25 MG tablet Take 1 tablet (25 mg total) by mouth daily.   metoprolol succinate (TOPROL-XL) 25 MG 24 hr tablet Take 1/2 (one-half) tablet by mouth once daily   Multiple Vitamin (MULTIVITAMIN WITH MINERALS) TABS tablet Take 1 tablet by mouth daily.   oxymetazoline (AFRIN) 0.05  % nasal spray Place 1 spray into both nostrils 2 (two) times daily as needed for congestion.   pantoprazole (PROTONIX) 40 MG tablet Take 1 tablet by mouth once daily   sertraline (ZOLOFT) 100 MG tablet Take 1 tablet by mouth once daily   traMADol (ULTRAM) 50 MG tablet TAKE 1 TABLET BY MOUTH THREE TIMES DAILY AS NEEDED FOR  KNEE  PAIN   vitamin B-12 (CYANOCOBALAMIN) 500 MCG tablet Take 500 mcg by mouth daily.      Allergies:   Sulfa antibiotics, Nexletol [bempedoic acid], Tricor [fenofibrate], Zetia [ezetimibe], Contrast media [iodinated diagnostic agents], Lipitor [atorvastatin], and Pravastatin   Social History   Socioeconomic History   Marital status: Married    Spouse name: Ann White   Number of children: 2   Years of education: Not on file   Highest education level: Not on file  Occupational History   Occupation: retired   Tobacco Use   Smoking status: Former    Packs/day: 0.25    Years: 3.00    Pack years: 0.75    Types: Cigarettes    Quit date: 1990    Years since quitting: 32.4   Smokeless tobacco: Never   Tobacco comments:    minimal smoking history  Vaping Use   Vaping Use: Never used  Substance and Sexual Activity   Alcohol use: No    Alcohol/week: 0.0 standard drinks   Drug use: No   Sexual activity: Never  Other Topics Concern   Not on file  Social History Narrative   Lives with husband and 2 cats.  2 grown children, 5 grandchildren.   Occupation: retired, worked in [employer]   Activity: no regular exercise.  Does walk in summer   Diet: fruits/vegetables daily, good water.   Social Determinants of Health   Financial Resource Strain: Low Risk    Difficulty of Paying Living Expenses: Not hard at all  Food Insecurity: No Food Insecurity   Worried About Charity fundraiser in the Last Year: Never true   [place] in the Last Year: Never true  Transportation Needs: No Transportation Needs   Lack of Transportation (Medical): No   Lack of Transportation  (Non-Medical): No  Physical Activity: Inactive   Days of Exercise per Week: 0 days   Minutes of Exercise per Session: 0 min  Stress: No Stress Concern Present   Feeling of Stress : Not at all  Social Connections: Not on file     Family History: The patient's family history includes CAD (age of onset: 41) in her father; CAD (age of onset: 57) in her mother; Diabetes in her father; Hyperlipidemia in her sister; Hypertension in her mother; Parkinson's disease in  her father. There is no history of Cancer, Stroke, Colon cancer, Breast cancer, Colon polyps, Esophageal cancer, Stomach cancer, or Rectal cancer.  ROS:   Please see the history of present illness.    All other systems reviewed and are negative.  EKGs/Labs/Other Studies Reviewed:    The following studies were reviewed today:   EKG:  The ekg ordered today demonstrates normal sinus rhythm.  Ventricular rate 42 bpm.  QTc 400 ms.  Recent Labs: 03/25/2020: ALT 10 03/30/2020: TSH 1.40 05/05/2020: Hemoglobin 10.7; Platelets 174 08/18/2020: Magnesium 2.3 08/25/2020: BUN 19; Creatinine, Ser 1.22; Potassium 4.3; Sodium 140  Recent Lipid Panel    Component Value Date/Time   CHOL 150 03/25/2020 0838   CHOL 191 09/14/2019 1019   TRIG 191.0 (H) 03/25/2020 0838   HDL 47.30 03/25/2020 0838   HDL 59 09/14/2019 1019   CHOLHDL 3 03/25/2020 0838   VLDL 38.2 03/25/2020 0838   LDLCALC 65 03/25/2020 0838   LDLCALC 107 (H) 09/14/2019 1019   LDLDIRECT 132.0 03/18/2019 0916    Physical Exam:    VS:  BP 118/60   Pulse (!) 42   Ht _0  (1.6 m)   Wt 139 lb (63 kg)   BMI 24.62 kg/m     Wt Readings from Last 3 Encounters:  12/30/20 139 lb (63 kg)  12/14/20 139 lb (63 kg)  10/07/20 136 lb 8 oz (61.9 kg)     GEN:  Well nourished, well developed in no acute distress HEENT: Normal NECK: No JVD; No carotid bruits LYMPHATICS: No lymphadenopathy CARDIAC: RRR, no murmurs, rubs, gallops RESPIRATORY:  Clear to auscultation without rales,  wheezing or rhonchi  ABDOMEN: Soft, non-tender, non-distended MUSCULOSKELETAL:  No edema; No deformity  SKIN: Warm and dry NEUROLOGIC:  Alert and oriented x 3 PSYCHIATRIC:  Normal affect   ASSESSMENT:    1. Persistent atrial fibrillation ([place])   2. Chronic HFrEF (heart failure with reduced ejection fraction) ([place])   3. Nonischemic cardiomyopathy (HCC)   4. Stage 3 chronic kidney disease, unspecified whether stage 3a or 3b CKD ([place])   5. Encounter for long-term current use of high risk medication    PLAN:    In order of problems listed above:  Persistent atrial fibrillation On Tikosyn.  QTC stable.  On Eliquis for stroke prophylaxis.  Tolerating this medication well. Check BMP and magnesium today.  Follow-up 3 months.  2.  Chronic systolic heart failure secondary to nonischemic cardiomyopathy NYHA class II.  Warm and dry.  Rhythm control strategy indicated.  Continue losartan, metoprolol.  3.  CKD 3a BMP as above  4.  High risk medication use Lab work as above.  Follow-up 3 months.  Follow-up with me in 3 months.  Medication Adjustments/Labs and Tests Ordered: Current medicines are reviewed at length with the patient today.  Concerns regarding medicines are outlined above.  No orders of the defined types were placed in this encounter.  No orders of the defined types were placed in this encounter.    Signed, Lars Mage, MD, [facility], [facility] 12/30/2020 9:22 AM    Electrophysiology [facility]

## 2020-12-30 ENCOUNTER — Other Ambulatory Visit: Payer: Self-pay

## 2020-12-30 ENCOUNTER — Ambulatory Visit (INDEPENDENT_AMBULATORY_CARE_PROVIDER_SITE_OTHER): Payer: Medicare HMO | Admitting: Cardiology

## 2020-12-30 ENCOUNTER — Encounter: Payer: Self-pay | Admitting: Cardiology

## 2020-12-30 VITALS — BP 118/60 | HR 42 | Ht 63.0 in | Wt 139.0 lb

## 2020-12-30 DIAGNOSIS — N183 Chronic kidney disease, stage 3 unspecified: Secondary | ICD-10-CM | POA: Diagnosis not present

## 2020-12-30 DIAGNOSIS — I5022 Chronic systolic (congestive) heart failure: Secondary | ICD-10-CM

## 2020-12-30 DIAGNOSIS — Z79899 Other long term (current) drug therapy: Secondary | ICD-10-CM

## 2020-12-30 DIAGNOSIS — I428 Other cardiomyopathies: Secondary | ICD-10-CM | POA: Diagnosis not present

## 2020-12-30 DIAGNOSIS — I4819 Other persistent atrial fibrillation: Secondary | ICD-10-CM | POA: Diagnosis not present

## 2020-12-30 LAB — BASIC METABOLIC PANEL
BUN/Creatinine Ratio: 19 (ref 12–28)
BUN: 24 mg/dL (ref 8–27)
CO2: 23 mmol/L (ref 20–29)
Calcium: 9.6 mg/dL (ref 8.7–10.3)
Chloride: 102 mmol/L (ref 96–106)
Creatinine, Ser: 1.28 mg/dL — ABNORMAL HIGH (ref 0.57–1.00)
Glucose: 88 mg/dL (ref 65–99)
Potassium: 4.6 mmol/L (ref 3.5–5.2)
Sodium: 141 mmol/L (ref 134–144)
eGFR: 44 mL/min/{1.73_m2} — ABNORMAL LOW (ref >59)

## 2020-12-30 LAB — MAGNESIUM: Magnesium: 2.4 mg/dL — ABNORMAL HIGH (ref 1.6–2.3)

## 2020-12-30 NOTE — Patient Instructions (Signed)
Medication Instructions:  No changes *If you need a refill on your cardiac medications before your next appointment, please call your pharmacy*   Lab Work: Today: bmet, magnesium If you have labs (blood work) drawn today and your tests are completely normal, you will receive your results only by: [place] (if you have MyChart) OR A paper copy in the mail If you have any lab test that is abnormal or we need to change your treatment, we will call you to review the results.   Testing/Procedures: none   Follow-Up: At [facility], you and your health needs are our priority.  As part of our continuing mission to provide you with exceptional heart care, we have created designated Provider Care Teams.  These Care Teams include your primary Cardiologist (physician) and Advanced Practice Providers (APPs -  Physician Assistants and Nurse Practitioners) who all work together to provide you with the care you need, when you need it.  We recommend signing up for the patient portal called "MyChart".  Sign up information is provided on this After Visit Summary.  MyChart is used to connect with patients for Virtual Visits (Telemedicine).  Patients are able to view lab/test results, encounter notes, upcoming appointments, etc.  Non-urgent messages can be sent to your provider as well.   To learn more about what you can do with MyChart, go to [email or web address].    Your next appointment:   3 month(s)  The format for your next appointment:   In Person  Provider:   You may see Vickie Epley, MD or one of the following Advanced Practice Providers on your designated Care Team:    Tommye Standard, [place] Legrand Como "Jonni Sanger" Chalmers Cater, [place]   Other Instructions

## 2021-01-05 ENCOUNTER — Telehealth: Payer: Self-pay | Admitting: Internal Medicine

## 2021-01-05 NOTE — Telephone Encounter (Signed)
Pt's cost of Eliquis has incr from $47 to $142/mo and now unable to afford.  Pt states "I am doing so well with Eliquis and want to continue if I can."  I see note 07/13/20 discussing possible transition to warfarin d/t continued cost issues.  Pt would like to discuss all options to be able to continue Eliquis at this time.  Currently Good Rx has no affordable options either, best cost on GoodRx is over $500.  Pt has one week supply left and unable to afford refill at pharmacy.   Samples provided for 8 weeks at front desk.  Current dose Eliquis 5mg  TWICE daily.  Notified pt I would discuss with provider and PharmD in the meantime to determine best options.   Pt appreciative of assistance. Will pick up samples and wait for call back to discuss.

## 2021-01-05 NOTE — Telephone Encounter (Signed)
Please call to discuss Eliquis . Patient states she is in the donut hole. Patient states this medication is working very well for her and would like to stay on it.

## 2021-01-05 NOTE — Telephone Encounter (Signed)
Thank you for this information. I called and made pt aware of below.  Pt will pick up pt assistance application at front desk with samples.  She will request report from pharmacy to include with paperwork and will drop off completed application to front desk.  I have provider portion with me and will send with completed application.   Pt has f/u scheduled with Dr. Saunders Revel 01/18/21.  She has enough samples to cover through then.  If pt assistance not approved, pt will discuss further at office visit.

## 2021-01-05 NOTE — Telephone Encounter (Signed)
Unfortunately there is not much help we can provide. She can try applying to patient assistance through [employer] (the drug company), but she will have had to of spent 3% of of her annual household income on prescription expenses for the entire family this year in order to qualify.   Note to Medicare Patients: In addition to your application, you will need to submit documentation showing you have spent 3% of your annual household income on out-of-pocket prescription expenses for you and/or other members of your household for the year in which you are seeking assistance. Your pharmacy can provide this report.  If she does not qualify for this, then there is no other assistance currently available. She can try applying for low income subsidy (extra help) through social security

## 2021-01-06 NOTE — Telephone Encounter (Signed)
Patient came by office  Dropped off pharmacy paper work and picked up [place] in nurse box

## 2021-01-10 NOTE — Telephone Encounter (Signed)
Received pharmacy paperwork to add to pt assistance application once complete.  Placed with this nurse's file and will send with completed application after pt drops off.

## 2021-01-18 ENCOUNTER — Ambulatory Visit: Payer: Medicare HMO | Admitting: Internal Medicine

## 2021-01-25 NOTE — Telephone Encounter (Signed)
Pt dropped off signed application of pt's portion.  Faxed completed pt assistance application to [employer] @ [phone].  Confirmation received. Placed in designated file.

## 2021-01-26 ENCOUNTER — Telehealth: Payer: Self-pay

## 2021-01-26 NOTE — Telephone Encounter (Signed)
Spoke with patient and reviewed that she does not qualify for assistance through [facility] and she stated she knew she would not qualify. Inquired if she had tried to apply for any additional assistance through Medicare/Medicaid. She states that she did go to talk with medicare office and they would not talk with her. She states that she still has some but is not sure what they will do when that runs out. Advised that I would be in touch if there are any additional resources or programs available. She was Production manager for the call with no further questions at this time.

## 2021-01-26 NOTE — Telephone Encounter (Signed)
Spoke with patient and reviewed numbers that may be able to offer some assistance with her medication. Numbers were 800-Medicare and another from pharmacist was [phone]. Requested that if she should have any success with these to let us know as this could be helpful to others. She was agreeable and was appreciative for the information.

## 2021-01-26 NOTE — Telephone Encounter (Signed)
Faxed received from the Patient Assistance Program that Ann White is not eligible to receive Eliquis for the following reason: Documentation of 3% out-of-pocket Rx expenses, based on the household adjusted gross income, not met. Please advise information regarding this with the patient and if the need to pursue with an appeal, please contact the [employer] at [phone].

## 2021-01-26 NOTE — Telephone Encounter (Signed)
Please see pharmacist response from 01/05/2021.  Patient can apply for low income subsidy online via [email or web address] or calling [phone]  Income requirement: Single person - yearly income less than $19,320 (1,610 monthly) and less than $13,290 in other resources per year  Married person living with a spouse and no other dependents - yearly income less than $26,136 (2,178 monthly) and less than $26,520  in other resources per year   Nothing else is available at this time. May need to transition to Warfarin if unable to afford Eliquis.

## 2021-01-30 NOTE — Telephone Encounter (Signed)
Spoke with patient and reviewed her total income showing $28,400 and calculated her 3% which would be $ 852.00. Reviewed that she would need out of pocket expense for both her and her husband. She will get those documents to include her tax return to bring in to submit with application. Patient assistance application completed and just pending her signature. Placed under "Pending Application" in my file box until we can get needed documents. Provider portion placed on Dr. Darnelle Bos desk for his signature. Patient will get this information needed and she will bring in documents once she gets them together. She was appreciative for the assistance with no further questions at this time.

## 2021-01-30 NOTE — Telephone Encounter (Signed)
Spoke with patient and she called for assistance with her medications but did not qualify for help. Reviewed that she would need to spend at least 3% of her income in order to qualify for assistance. Advised I can get application for her completed for Eliquis assistance but she would need documentation such as tax forms, prescription drug out of pocket expense report and so forth. She is going to get her tax forms and call back with information to determine if she has spent enough to qualify. She was appreciative for the assistance.

## 2021-01-30 NOTE — Telephone Encounter (Signed)
Patient returning call to discuss medication issue below.  She states she has talked to a few people and wants to update pam.   She is still waiting on answers but still wants to update pam.

## 2021-01-31 NOTE — Telephone Encounter (Signed)
Patient called in stating that she received call from assistance program stating that we should go ahead and fax information to Fax# [phone]. Will fax over to them for processing.

## 2021-01-31 NOTE — Telephone Encounter (Signed)
Patient came in to bring in additional documentation and sign new application for Eliquis. Based on information she provided she needs to spend additional $118.35 in order to qualify. Will provide this documentation to Dr. Darnelle Bos nurse to hold onto for her to get updated out of pocket expense once she has met this requirement.

## 2021-01-31 NOTE — Telephone Encounter (Signed)
Application is in this nurse's file at her desk and will send completed/updated application when pt has met out of pocket expense and notifies our office.

## 2021-01-31 NOTE — Telephone Encounter (Signed)
Spoke with patient and reviewed requirement of spending additional $118.35 in order to qualify. Advised that once either her or husband spend more then to contact our office and ask for Dr. Darnelle Bos nurse. She was very appreciative for the help and had no further questions.

## 2021-02-01 NOTE — Telephone Encounter (Signed)
Faxed application for BMSPAF faxed to [phone].  Number listed below is phone number.  Confirmation received via fax.  Application placed in [employer].

## 2021-02-02 NOTE — Telephone Encounter (Signed)
Patient calling in to state she received a call from [facility]. Patient states she is going to be able to receive medication through 12/22  Please advise is needed

## 2021-02-03 ENCOUNTER — Other Ambulatory Visit: Payer: Self-pay

## 2021-02-03 ENCOUNTER — Ambulatory Visit (INDEPENDENT_AMBULATORY_CARE_PROVIDER_SITE_OTHER): Payer: Medicare HMO | Admitting: Nurse Practitioner

## 2021-02-03 ENCOUNTER — Encounter: Payer: Self-pay | Admitting: Nurse Practitioner

## 2021-02-03 ENCOUNTER — Telehealth: Payer: Self-pay | Admitting: Internal Medicine

## 2021-02-03 VITALS — BP 116/72 | HR 61 | Temp 98.3°F | Ht 63.0 in | Wt 140.0 lb

## 2021-02-03 DIAGNOSIS — N390 Urinary tract infection, site not specified: Secondary | ICD-10-CM | POA: Insufficient documentation

## 2021-02-03 DIAGNOSIS — R300 Dysuria: Secondary | ICD-10-CM | POA: Insufficient documentation

## 2021-02-03 DIAGNOSIS — N3001 Acute cystitis with hematuria: Secondary | ICD-10-CM | POA: Diagnosis not present

## 2021-02-03 LAB — POCT URINALYSIS DIP (MANUAL ENTRY)
Bilirubin, UA: NEGATIVE
Glucose, UA: NEGATIVE mg/dL
Ketones, POC UA: NEGATIVE mg/dL
Nitrite, UA: NEGATIVE
Protein Ur, POC: 30 mg/dL — AB
Spec Grav, UA: 1.025 (ref 1.010–1.025)
Urobilinogen, UA: 0.2 E.U./dL
pH, UA: 5.5 (ref 5.0–8.0)

## 2021-02-03 MED ORDER — CIPROFLOXACIN HCL 250 MG PO TABS: 250.0000 mg | ORAL_TABLET | Freq: Two times a day (BID) | ORAL | 0 refills | Status: DC

## 2021-02-03 MED ORDER — CEFDINIR 300 MG PO CAPS: 300.0000 mg | ORAL_CAPSULE | Freq: Two times a day (BID) | ORAL | 0 refills | Status: AC

## 2021-02-03 NOTE — Assessment & Plan Note (Addendum)
UA in office indicative of urinary tract infection.  Patient has history of same.  We will send off urine culture. Called patient after she left office to change antibiotics.  We will change from Cipro to cefdinir given patient's allergy to sulfa and GFR of 41 with Cockcroft-Gault formula want to avoid Macrobid. She is on Tikosyn and tramadol with history of A. fib.  Last ECG QTC was 400 MMS.

## 2021-02-03 NOTE — Telephone Encounter (Signed)
Updated patient that we received approval letter for her Eliquis. Will place letter in file box in samples closet. Reviewed that next year will start process over again and she verbalized understanding. She was appreciative for all of our help with this process and had no further questions.

## 2021-02-03 NOTE — Patient Instructions (Signed)
As discussed in office you have a urinary tract infection I am going to send the antibiotics electronically to your pharmacy. Please take them as directed and complete the entire course. I am also going to send your urine off for a culture to make sure the antibiotic is targeting the right bug in your urine Call the office with any questions or concerns Keep follow up appointments as scheduled  It was a pleasure being a part of your health care journey!

## 2021-02-03 NOTE — Progress Notes (Signed)
Acute Office Visit  Subjective:    Patient ID: Ann White, female    DOB: 08-01-1945, 75 y.o.   MRN: [id number]  Chief Complaint  Patient presents with   burning sensation when she pee's    HPI Patient is in today for dysuria for the last couple days.  She has a history of UTIs in the past.  Have symptoms of dysuria, urinary frequency, urinary urgency.  Patient denies hematuria, fever, chills, nausea, vomiting, lower back pain, and abdominal pain.  She has not tried any over-the-counter treatments for her dysuria.  Asked about AZO, states she is not able to take that medication due to her prescribed medicines.  Patient mentions she has going out of town traveling to [place].  And wants to make sure she does not have a urinary tract infection or other etiology.  Past Medical History:  Diagnosis Date   Allergy    Anxiety    Arthritis    Carotid stenosis    a. 02/2016 Carotid U/S: Bilateral 40-59%; b. 03/2017 Carotid U/S: RICA 38-32%, LICA 9-19%; c. 16/6060 Carotid U/S: bilat 40-59% ICA dzs.   Cervical spondylosis    s/p spine injections   CKD (chronic kidney disease), stage III (HCC)    Depression    Ectatic abdominal aorta ([place]) 2016   rpt [place] 5 yrs   GERD (gastroesophageal reflux disease)    History of chicken pox    History of diverticulitis of colon    HLD (hyperlipidemia)    HTN (hypertension)    Irritable bowel syndrome with constipation    Lichen sclerosus et atrophicus    NICM (nonischemic cardiomyopathy) ([place])    a. 04/2017 Echo: EF 35-40%, diff HK; b. 05/2017 MV: EF 56%, no ischemia/infarct; c. 04/2018 Echo: EF 55-60%, no rwma, Gr2 DD; d. 03/2020 Echo: EF 40-45%, nl PASP, mod dil LA, mild to mod dil RA, Triv MR.   Obesity    Persistent atrial fibrillation ([place])    a. Dx 03/2020. CHA2DS2VASc = 5-->xarelto 44m daily.   Premature atrial contraction    a. 04/2017 48hr Holter: Predominant rhythm - sinus. Rare PVC's, freq PAC's with freq atrial runs up to 34  beats-->metoprolol started.   Seasonal allergic rhinitis     Past Surgical History:  Procedure Laterality Date   bladder tack  1990s   BREAST BIOPSY Right 03/05/2012    benign   CARDIOVERSION N/A 05/17/2020   Procedure: CARDIOVERSION;  Surgeon: ENelva Bush MD;  Location: [facility] ORS;  Service: Cardiovascular;  Laterality: N/A;   CARPAL TUNNEL RELEASE  2004, 2013   bilateral   CHOLECYSTECTOMY  1990s   COLONOSCOPY  2002   COLONOSCOPY  06/2013   mod diverticulosis, 3 tubular adenomas, int hem rpt 5 yrs ([facility]   COLONOSCOPY  06/2018   TA,c olonic angiodysplastic lesion, mod diverticulosis, rpt 5 yrs ([facility]   DEXA  07/2011   normal, T score -0.3   ESOPHAGOGASTRODUODENOSCOPY ENDOSCOPY  2004   normal ([place])   TOTAL ABDOMINAL HYSTERECTOMY W/ BILATERAL SALPINGOOPHORECTOMY  1990s   complete, dysmenorrhea and fibroids    Family History  Problem Relation Age of Onset   CAD Mother 612      MI   Hypertension Mother    Parkinson's disease Father    Diabetes Father    CAD Father 675      MI   Hyperlipidemia Sister    Cancer Neg Hx    Stroke Neg Hx    Colon cancer Neg  Hx    Breast cancer Neg Hx    Colon polyps Neg Hx    Esophageal cancer Neg Hx    Stomach cancer Neg Hx    Rectal cancer Neg Hx     Social History   Socioeconomic History   Marital status: Married    Spouse name: Freddie   Number of children: 2   Years of education: Not on file   Highest education level: Not on file  Occupational History   Occupation: retired   Tobacco Use   Smoking status: Former    Packs/day: 0.25    Years: 3.00    Pack years: 0.75    Types: Cigarettes    Quit date: 1990    Years since quitting: 32.5   Smokeless tobacco: Never   Tobacco comments:    minimal smoking history  Vaping Use   Vaping Use: Never used  Substance and Sexual Activity   Alcohol use: No    Alcohol/week: 0.0 standard drinks   Drug use: No   Sexual activity: Never  Other Topics Concern   Not on file   Social History Narrative   Lives with husband and 2 cats.  2 grown children, 5 grandchildren.   Occupation: retired, worked in [employer]   Activity: no regular exercise.  Does walk in summer   Diet: fruits/vegetables daily, good water.   Social Determinants of Health   Financial Resource Strain: Low Risk    Difficulty of Paying Living Expenses: Not hard at all  Food Insecurity: No Food Insecurity   Worried About Charity fundraiser in the Last Year: Never true   [place] in the Last Year: Never true  Transportation Needs: No Transportation Needs   Lack of Transportation (Medical): No   Lack of Transportation (Non-Medical): No  Physical Activity: Inactive   Days of Exercise per Week: 0 days   Minutes of Exercise per Session: 0 min  Stress: No Stress Concern Present   Feeling of Stress : Not at all  Social Connections: Not on file  Intimate Partner Violence: Not At Risk   Fear of Current or Ex-Partner: No   Emotionally Abused: No   Physically Abused: No   Sexually Abused: No    Outpatient Medications Prior to Visit  Medication Sig Dispense Refill   acetaminophen (TYLENOL) 500 MG tablet Take 500-1,000 mg by mouth every 6 (six) hours as needed (knee pain.).     apixaban (ELIQUIS) 5 MG TABS tablet Take 5 mg by mouth 2 (two) times daily.     cetirizine (ZYRTEC) 10 MG tablet Take 10 mg by mouth daily.      Cholecalciferol (VITAMIN D3) 1000 UNITS CAPS Take 1 capsule (1,000 Units total) by mouth daily.     clonazePAM (KLONOPIN) 1 MG tablet Take 1 tablet by mouth twice daily as needed 60 tablet 0   docusate sodium (COLACE) 100 MG capsule Take 100-200 mg by mouth daily as needed (constipation.).     dofetilide (TIKOSYN) 125 MCG capsule Take 1 capsule (125 mcg total) by mouth 2 (two) times daily. 60 capsule 6   Evolocumab (REPATHA SURECLICK) 712 MG/ML SOAJ Inject 1 pen into the skin every 14 (fourteen) days. 6 mL 3   gabapentin (NEURONTIN) 300 MG capsule Take 300 mg by mouth at bedtime.      losartan (COZAAR) 25 MG tablet Take 1 tablet (25 mg total) by mouth daily. 30 tablet 6   metoprolol succinate (TOPROL-XL) 25 MG 24 hr tablet Take  1/2 (one-half) tablet by mouth once daily 45 tablet 0   Multiple Vitamin (MULTIVITAMIN WITH MINERALS) TABS tablet Take 1 tablet by mouth daily.     oxymetazoline (AFRIN) 0.05 % nasal spray Place 1 spray into both nostrils 2 (two) times daily as needed for congestion.     pantoprazole (PROTONIX) 40 MG tablet Take 1 tablet by mouth once daily 90 tablet 2   sertraline (ZOLOFT) 100 MG tablet Take 1 tablet by mouth once daily 90 tablet 3   traMADol (ULTRAM) 50 MG tablet TAKE 1 TABLET BY MOUTH THREE TIMES DAILY AS NEEDED FOR  KNEE  PAIN 120 tablet 0   vitamin B-12 (CYANOCOBALAMIN) 500 MCG tablet Take 500 mcg by mouth daily.      No facility-administered medications prior to visit.    Allergies  Allergen Reactions   Sulfa Antibiotics Swelling    Oral swelling   Nexletol [Bempedoic Acid]     Dizziness, "eyes felt funny"   Tricor [Fenofibrate] Other (See Comments)    myalgias   Zetia [Ezetimibe] Other (See Comments)    Dizziness   Contrast Media [Iodinated Diagnostic Agents] Other (See Comments)    Oral contrast caused mouth blisters   Lipitor [Atorvastatin] Other (See Comments)    myalgias   Pravastatin Other (See Comments)    myalgias    Review of Systems  Constitutional:  Negative for chills and fever.  Gastrointestinal:  Negative for abdominal pain, diarrhea, nausea and vomiting.  Genitourinary:  Positive for dysuria, frequency and urgency. Negative for flank pain and hematuria.      Objective:    Physical Exam Constitutional:      Appearance: Normal appearance.  Cardiovascular:     Rate and Rhythm: Regular rhythm. Bradycardia present.     Heart sounds: No murmur heard. Pulmonary:     Effort: Pulmonary effort is normal.     Breath sounds: Normal breath sounds.  Abdominal:     General: Bowel sounds are normal.     Palpations:  Abdomen is soft.     Tenderness: There is no abdominal tenderness. There is no right CVA tenderness or left CVA tenderness.  Neurological:     Mental Status: She is alert.    Pulse 61   Temp 98.3 F (36.8 C) (Temporal)   Ht _0  (1.6 m)   Wt 140 lb (63.5 kg)   SpO2 96%   BMI 24.80 kg/m  Wt Readings from Last 3 Encounters:  02/03/21 140 lb (63.5 kg)  12/30/20 139 lb (63 kg)  12/14/20 139 lb (63 kg)    Health Maintenance Due  Topic Date Due   Zoster Vaccines- Shingrix (1 of 2) Never done   COVID-19 Vaccine (4 - Booster for Pfizer series) 08/02/2020    There are no preventive care reminders to display for this patient.     Lab Results  Component Value Date   NA 141 12/30/2020   K 4.6 12/30/2020   CO2 23 12/30/2020   GLUCOSE 88 12/30/2020   BUN 24 12/30/2020   CREATININE 1.28 (H) 12/30/2020   BILITOT 0.4 03/25/2020   ALKPHOS 39 03/25/2020   AST 21 03/25/2020   ALT 10 03/25/2020   PROT 7.6 03/25/2020   ALBUMIN 4.5 03/25/2020   CALCIUM 9.6 12/30/2020   ANIONGAP 10 08/25/2020   EGFR 44 (L) 12/30/2020   GFR 34.85 (L) 03/25/2020       Assessment & Plan:   Problem List Items Addressed This Visit   None Visit Diagnoses  Painful urination    -  Primary   Relevant Orders   POCT urinalysis dipstick        No orders of the defined types were placed in this encounter.    Romilda Garret, NP

## 2021-02-03 NOTE — Telephone Encounter (Signed)
See subsequent telephone encounter regarding this message.

## 2021-02-03 NOTE — Telephone Encounter (Signed)
Letter received from [facility] stating that this patient was approved. Application # [id number] and will receive free medication from 02/02/2021 through 07/15/2021.

## 2021-02-05 LAB — URINE CULTURE
MICRO NUMBER:: 12152416
SPECIMEN QUALITY:: ADEQUATE

## 2021-03-01 ENCOUNTER — Other Ambulatory Visit (HOSPITAL_COMMUNITY): Payer: Self-pay | Admitting: Physician Assistant

## 2021-03-06 ENCOUNTER — Other Ambulatory Visit: Payer: Self-pay | Admitting: Family Medicine

## 2021-03-06 NOTE — Telephone Encounter (Signed)
Refill request Tramadol Last refill 11/02/20 #120 Last office visit 02/03/21 acute No upcoming appointment scheduled

## 2021-03-07 NOTE — Telephone Encounter (Signed)
ERx 

## 2021-03-24 ENCOUNTER — Ambulatory Visit (INDEPENDENT_AMBULATORY_CARE_PROVIDER_SITE_OTHER): Payer: Medicare HMO | Admitting: Internal Medicine

## 2021-03-24 ENCOUNTER — Encounter: Payer: Self-pay | Admitting: Internal Medicine

## 2021-03-24 ENCOUNTER — Other Ambulatory Visit: Payer: Self-pay

## 2021-03-24 VITALS — BP 120/54 | HR 43 | Ht 63.0 in | Wt 141.0 lb

## 2021-03-24 DIAGNOSIS — I428 Other cardiomyopathies: Secondary | ICD-10-CM

## 2021-03-24 DIAGNOSIS — R001 Bradycardia, unspecified: Secondary | ICD-10-CM | POA: Insufficient documentation

## 2021-03-24 DIAGNOSIS — I10 Essential (primary) hypertension: Secondary | ICD-10-CM | POA: Diagnosis not present

## 2021-03-24 DIAGNOSIS — I4819 Other persistent atrial fibrillation: Secondary | ICD-10-CM

## 2021-03-24 DIAGNOSIS — E785 Hyperlipidemia, unspecified: Secondary | ICD-10-CM | POA: Diagnosis not present

## 2021-03-24 DIAGNOSIS — I5022 Chronic systolic (congestive) heart failure: Secondary | ICD-10-CM | POA: Diagnosis not present

## 2021-03-24 NOTE — Progress Notes (Signed)
Follow-up Outpatient Visit Date: 03/24/2021  Primary Care Provider: Ria Bush, MD [place] [address]  Chief Complaint: Follow-up nonischemic cardiomyopathy and atrial fibrillation  HPI:  Ann White is a 75 y.o. female with history of non-ischemic cardiomyopathy diagnosed in 04/2017 (MPI without perfusion defects LVEF as low as 35-40% but normalized on repeat echo in 04/2018), persistent atrial fibrillation diagnosed in 03/2020, carotid artery stenosis, abdominal aorta ectasia, right calf DVT, hypertension, hyperlipidemia, and chronic kidney disease, who presents for follow-up of nonischemic cardiomyopathy and atrial fibrillation.  I last saw her in March, at which time she was feeling well.  She was not taking Repatha at the time due to financial constraints.  She remains on apixaban and dofetilide under the direction of Dr. Quentin Ore.  Today, Ann White reports that she has been feeling fairly well.  She notes a little fatigue at times but is able to complete her normal activities without limitation.  She denies chest pain, shortness of breath, palpitations, lightheadedness, and edema.  She bruises easily but does not have significant bleeding, remaining on apixaban.  She is concerned about the cost of apixaban, especially if she enters the "donut hole."  --------------------------------------------------------------------------------------------------  Cardiovascular History & Procedures: Cardiovascular Problems: Persistent atrial fibrillation Nonischemic cardiomyopathy Carotid artery stenosis   Risk Factors: Hypertension, hyperlipidemia, and age greater than 65   Cath/PCI: None   CV Surgery: None   EP Procedures and Devices: DCCV (05/17/20) 48 hour Holter monitor (04/29/17): Sinus rhythm with frequent PACs and atrial runs, lasting up to 34 beats.   Non-Invasive Evaluation(s): TTE (09/20/2020): Normal LV size wall thickness.  LVEF 60-65% with normal wall  motion.  Grade 1 diastolic dysfunction.  Normal RV size and function.  Severe left and mild to moderate right atrial enlargement.  Trivial mitral and mild tricuspid regurgitation. Carotid Doppler (08/15/2020): 40-59% stenoses in both carotid arteries.  Greater than 50% stenosis of right external carotid artery.  Antegrade vertebral artery flow. TTE (04/14/2020): Normal LV size and wall thickness.  LVEF 40-45% with global hypokinesis.  Normal RV size and function.  Moderate left atrial and mild to moderate right atrial enlargement.  Trivial mitral and mild tricuspid regurgitation. Carotid Doppler (04/17/2019): 40-59% stenosis in the carotid arteries bilaterally.  Antegrade flow in the vertebral arteries.  Normal subclavian flow. TTE (04/24/18): Normal LV size.  LVEF 55-60% with normal wall motion.  Grade 2 diastolic dysfunction.  Mild left atrial enlargement.  Normal RV size and function.  Mild pulmonary hypertension (PASP 41 mmHg). Exercise MPI (06/12/17): No evidence of ischemia. LVEF 56%. Hypertensive blood pressure response to exercise. Echo (04/29/17): Normal LV size with moderately reduced contraction. LVEF 35-40% with global hypokinesis. Mild MR. Mild left atrial enlargement. Normal RV size and function. Normal PA pressure. Carotid Doppler (03/25/17): Heterogeneous plaque, bilaterally. Stable, 40-59% RICA stenosis. [id number] LICA stenosis. Stable, >50% RECA stenosis. Patent vertebral arteries with antegrade flow. Normal subclavian arteries, bilaterally.  Recent CV Pertinent Labs: Lab Results  Component Value Date   CHOL 150 03/25/2020   CHOL 191 09/14/2019   HDL 47.30 03/25/2020   HDL 59 09/14/2019   LDLCALC 65 03/25/2020   LDLCALC 107 (H) 09/14/2019   LDLDIRECT 132.0 03/18/2019   TRIG 191.0 (H) 03/25/2020   CHOLHDL 3 03/25/2020   INR 1.5 09/22/2015   INR 7.0 Repeated and verified X2. (HH) 06/13/2015   K 4.6 12/30/2020   MG 2.4 (H) 12/30/2020   BUN 24 12/30/2020   CREATININE 1.28 (H)  12/30/2020  CREATININE 1.19 07/26/2011    Past medical and surgical history were reviewed and updated in EPIC.  Current Meds  Medication Sig   acetaminophen (TYLENOL) 500 MG tablet Take 500-1,000 mg by mouth every 6 (six) hours as needed (knee pain.).   apixaban (ELIQUIS) 5 MG TABS tablet Take 5 mg by mouth 2 (two) times daily.   cetirizine (ZYRTEC) 10 MG tablet Take 10 mg by mouth daily.    Cholecalciferol (VITAMIN D3) 1000 UNITS CAPS Take 1 capsule (1,000 Units total) by mouth daily.   clonazePAM (KLONOPIN) 1 MG tablet Take 1 tablet by mouth twice daily as needed   docusate sodium (COLACE) 100 MG capsule Take 100-200 mg by mouth daily as needed (constipation.).   dofetilide (TIKOSYN) 125 MCG capsule TAKE 1 CAPSULE BY MOUTH TWO TIMES A DAY   Evolocumab (REPATHA SURECLICK) [id number] MG/ML SOAJ Inject 1 pen into the skin every 14 (fourteen) days.   gabapentin (NEURONTIN) 300 MG capsule Take 300 mg by mouth at bedtime.   losartan (COZAAR) 25 MG tablet Take 1 tablet (25 mg total) by mouth daily.   metoprolol succinate (TOPROL-XL) 25 MG 24 hr tablet Take 1/2 (one-half) tablet by mouth once daily   Multiple Vitamin (MULTIVITAMIN WITH MINERALS) TABS tablet Take 1 tablet by mouth daily.   oxymetazoline (AFRIN) 0.05 % nasal spray Place 1 spray into both nostrils 2 (two) times daily as needed for congestion.   pantoprazole (PROTONIX) 40 MG tablet Take 1 tablet by mouth once daily   sertraline (ZOLOFT) 100 MG tablet Take 1 tablet by mouth once daily   traMADol (ULTRAM) 50 MG tablet TAKE 1 TABLET BY MOUTH THREE TIMES DAILY AS NEEDED FOR  KNEE  PAIN   vitamin B-12 (CYANOCOBALAMIN) 500 MCG tablet Take 500 mcg by mouth daily.     Allergies: Sulfa antibiotics, Nexletol [bempedoic acid], Tricor [fenofibrate], Zetia [ezetimibe], Contrast media [iodinated diagnostic agents], Lipitor [atorvastatin], and Pravastatin  Social History   Tobacco Use   Smoking status: Former    Packs/day: 0.25    Years: 3.00     Pack years: 0.75    Types: Cigarettes    Quit date: 1990    Years since quitting: 32.7   Smokeless tobacco: Never   Tobacco comments:    minimal smoking history  Vaping Use   Vaping Use: Never used  Substance Use Topics   Alcohol use: No    Alcohol/week: 0.0 standard drinks   Drug use: No    Family History  Problem Relation Age of Onset   CAD Mother 17       MI   Hypertension Mother    Parkinson's disease Father    Diabetes Father    CAD Father 66       MI   Hyperlipidemia Sister    Cancer Neg Hx    Stroke Neg Hx    Colon cancer Neg Hx    Breast cancer Neg Hx    Colon polyps Neg Hx    Esophageal cancer Neg Hx    Stomach cancer Neg Hx    Rectal cancer Neg Hx     Review of Systems: A 12-system review of systems was performed and was negative except as noted in the HPI.  --------------------------------------------------------------------------------------------------  Physical Exam: BP (!) 120/54 (BP Location: Left Arm, Patient Position: Sitting, Cuff Size: Normal)   Pulse (!) 43   Ht '5\' 3"'$  (1.6 m)   Wt 141 lb (64 kg)   SpO2 94%   BMI 24.98 kg/m  General:  NAD. Neck: No JVD or HJR. Lungs: Clear to auscultation bilaterally without wheezes or crackles. Heart: Bradycardic but regular without murmurs. Abdomen: Soft, nontender, nondistended. Extremities: No lower extremity edema.  EKG: Sinus bradycardia (heart rate 43 bpm).  QTc 417 ms.  No significant change from prior tracing on 12/30/2020.  Lab Results  Component Value Date   WBC 3.5 05/05/2020   HGB 10.7 (L) 05/05/2020   HCT 33.3 (L) 05/05/2020   MCV 89 05/05/2020   PLT 174 05/05/2020    Lab Results  Component Value Date   NA 141 12/30/2020   K 4.6 12/30/2020   CL 102 12/30/2020   CO2 23 12/30/2020   BUN 24 12/30/2020   CREATININE 1.28 (H) 12/30/2020   GLUCOSE 88 12/30/2020   ALT 10 03/25/2020    Lab Results  Component Value Date   CHOL 150 03/25/2020   HDL 47.30 03/25/2020   LDLCALC 65  03/25/2020   LDLDIRECT 132.0 03/18/2019   TRIG 191.0 (H) 03/25/2020   CHOLHDL 3 03/25/2020    --------------------------------------------------------------------------------------------------  ASSESSMENT AND PLAN: Persistent atrial fibrillation and sinus bradycardia: Ann White is maintaining sinus rhythm albeit with persistent bradycardia.  She is minimally symptomatic but may benefit from a slightly higher heart rate.  We have agreed to discontinue metoprolol.  Continue apixaban and dofetilide with ongoing follow-up per Dr. Quentin Ore.  We will plan to check a CBC, CMP, magnesium level, and TSH today.  Hyperlipidemia: LDL well controlled on last check in 03/2020.  We will repeat check a lipid panel today with plans to continue Repatha.  Nonischemic cardiomyopathy: Ann White appears euvolemic and well compensated with NYHA class I/II symptoms.  Given bradycardia, we will discontinue metoprolol.  Continue low-dose losartan.  Follow-up: Return to clinic in 6 months.  Follow-up with Dr. Quentin Ore later this month as previously scheduled  Nelva Bush, MD 03/24/2021 10:13 AM

## 2021-03-24 NOTE — Patient Instructions (Addendum)
Medication Instructions:  STOP metoprolol  *If you need a refill on your cardiac medications before your next appointment, please call your pharmacy*   Lab Work: - Your physician recommends that you have lab work today:   CMP, CBC (no diff), Magnesium, Lipid, and TSH  If you have labs (blood work) drawn today and your tests are completely normal, you will receive your results only by: [place] (if you have MyChart) OR A paper copy in the mail If you have any lab test that is abnormal or we need to change your treatment, we will call you to review the results.   Testing/Procedures: None ordered  Follow-Up: At [facility], you and your health needs are our priority.  As part of our continuing mission to provide you with exceptional heart care, we have created designated Provider Care Teams.  These Care Teams include your primary Cardiologist (physician) and Advanced Practice Providers (APPs -  Physician Assistants and Nurse Practitioners) who all work together to provide you with the care you need, when you need it.  We recommend signing up for the patient portal called "MyChart".  Sign up information is provided on this After Visit Summary.  MyChart is used to connect with patients for Virtual Visits (Telemedicine).  Patients are able to view lab/test results, encounter notes, upcoming appointments, etc.  Non-urgent messages can be sent to your provider as well.   To learn more about what you can do with MyChart, go to [email or web address].    Your next appointment:   6 month(s)  The format for your next appointment:   In Person  Provider:   You may see Nelva Bush, MD or one of the following Advanced Practice Providers on your designated Care Team:   Murray Hodgkins, NP Christell Faith, PA-C Marrianne Mood, PA-C Cadence [place], [place]

## 2021-03-25 LAB — COMPREHENSIVE METABOLIC PANEL
ALT: 8 IU/L (ref 0–32)
AST: 20 IU/L (ref 0–40)
Albumin/Globulin Ratio: 2.2 (ref 1.2–2.2)
Albumin: 5.2 g/dL — ABNORMAL HIGH (ref 3.7–4.7)
Alkaline Phosphatase: 45 IU/L (ref 44–121)
BUN/Creatinine Ratio: 17 (ref 12–28)
BUN: 23 mg/dL (ref 8–27)
Bilirubin Total: 0.3 mg/dL (ref 0.0–1.2)
CO2: 20 mmol/L (ref 20–29)
Calcium: 9.9 mg/dL (ref 8.7–10.3)
Chloride: 103 mmol/L (ref 96–106)
Creatinine, Ser: 1.32 mg/dL — ABNORMAL HIGH (ref 0.57–1.00)
Globulin, Total: 2.4 g/dL (ref 1.5–4.5)
Glucose: 83 mg/dL (ref 65–99)
Potassium: 4.6 mmol/L (ref 3.5–5.2)
Sodium: 141 mmol/L (ref 134–144)
Total Protein: 7.6 g/dL (ref 6.0–8.5)
eGFR: 43 mL/min/{1.73_m2} — ABNORMAL LOW (ref >59)

## 2021-03-25 LAB — CBC
Hematocrit: 33.4 % — ABNORMAL LOW (ref 34.0–46.6)
Hemoglobin: 10.9 g/dL — ABNORMAL LOW (ref 11.1–15.9)
MCH: 29.6 pg (ref 26.6–33.0)
MCHC: 32.6 g/dL (ref 31.5–35.7)
MCV: 91 fL (ref 79–97)
Platelets: 192 10*3/uL (ref 150–450)
RBC: 3.68 x10E6/uL — ABNORMAL LOW (ref 3.77–5.28)
RDW: 14 % (ref 11.7–15.4)
WBC: 3.5 10*3/uL (ref 3.4–10.8)

## 2021-03-25 LAB — LIPID PANEL
Chol/HDL Ratio: 4.1 ratio (ref 0.0–4.4)
Cholesterol, Total: 215 mg/dL — ABNORMAL HIGH (ref 100–199)
HDL: 52 mg/dL (ref >39)
LDL Chol Calc (NIH): 132 mg/dL — ABNORMAL HIGH (ref 0–99)
Triglycerides: 174 mg/dL — ABNORMAL HIGH (ref 0–149)
VLDL Cholesterol Cal: 31 mg/dL (ref 5–40)

## 2021-03-25 LAB — TSH: TSH: 6.76 u[IU]/mL — ABNORMAL HIGH (ref 0.450–4.500)

## 2021-03-25 LAB — MAGNESIUM: Magnesium: 2.3 mg/dL (ref 1.6–2.3)

## 2021-03-28 ENCOUNTER — Other Ambulatory Visit (HOSPITAL_COMMUNITY): Payer: Self-pay | Admitting: Internal Medicine

## 2021-03-28 ENCOUNTER — Telehealth: Payer: Self-pay | Admitting: Pharmacist

## 2021-03-28 MED ORDER — ROSUVASTATIN CALCIUM 5 MG PO TABS: 5.0000 mg | ORAL_TABLET | ORAL | 3 refills | Status: DC

## 2021-03-28 NOTE — Telephone Encounter (Signed)
LDL creping up. Patient has been taking Repatha every 14 days. She is willing to try adding on rosuvastatin '5mg'$  three times a week. Recheck lipids in 3 months.  Allergies to several statins, zetia and Nexletol.

## 2021-03-29 ENCOUNTER — Telehealth: Payer: Self-pay

## 2021-03-29 NOTE — Telephone Encounter (Signed)
This is a Public relations account executive pt, Dr. Darnelle Bos pt

## 2021-03-29 NOTE — Telephone Encounter (Signed)
Plz schedule wellness and cpe visits.

## 2021-03-29 NOTE — Telephone Encounter (Signed)
Please advise if ok to refill Losartan 25 mg qd. Last filled by Dr. Shan Levans.

## 2021-04-03 ENCOUNTER — Other Ambulatory Visit: Payer: Self-pay | Admitting: Family Medicine

## 2021-04-03 NOTE — Progress Notes (Deleted)
Electrophysiology Office Follow up Visit Note:    Date:  04/03/2021   ID:  Ann White, DOB December 27, 1945, MRN [id number]  PCP:  Ria Bush, MD  [facility] Cardiologist:  Nelva Bush, MD  [facility] Electrophysiologist:  Vickie Epley, MD    Interval History:    Ann White is a 75 y.o. female who presents for a follow up visit. They were last seen in clinic December 30, 2020 for her persistent atrial fibrillation managed with Tikosyn.  She is overall done very well on Tikosyn.     Past Medical History:  Diagnosis Date   Allergy    Anxiety    Arthritis    Carotid stenosis    a. 02/2016 Carotid U/S: Bilateral 40-59%; b. 03/2017 Carotid U/S: RICA 123456, LICA 123456; c. [id number] Carotid U/S: bilat 40-59% ICA dzs.   Cervical spondylosis    s/p spine injections   CKD (chronic kidney disease), stage III (HCC)    Depression    Ectatic abdominal aorta ([place]) 2016   rpt [place] 5 yrs   GERD (gastroesophageal reflux disease)    History of chicken pox    History of diverticulitis of colon    HLD (hyperlipidemia)    HTN (hypertension)    Irritable bowel syndrome with constipation    Lichen sclerosus et atrophicus    NICM (nonischemic cardiomyopathy) ([place])    a. 04/2017 Echo: EF 35-40%, diff HK; b. 05/2017 MV: EF 56%, no ischemia/infarct; c. 04/2018 Echo: EF 55-60%, no rwma, Gr2 DD; d. 03/2020 Echo: EF 40-45%, nl PASP, mod dil LA, mild to mod dil RA, Triv MR.   Obesity    Persistent atrial fibrillation ([place])    a. Dx 03/2020. CHA2DS2VASc = 5-->xarelto '15mg'$  daily.   Premature atrial contraction    a. 04/2017 48hr Holter: Predominant rhythm - sinus. Rare PVC's, freq PAC's with freq atrial runs up to 34 beats-->metoprolol started.   Seasonal allergic rhinitis     Past Surgical History:  Procedure Laterality Date   bladder tack  1990s   BREAST BIOPSY Right 03/05/2012    benign   CARDIOVERSION N/A 05/17/2020   Procedure: CARDIOVERSION;  Surgeon: Nelva Bush,  MD;  Location: [facility] ORS;  Service: Cardiovascular;  Laterality: N/A;   CARPAL TUNNEL RELEASE  2004, 2013   bilateral   CHOLECYSTECTOMY  1990s   COLONOSCOPY  2002   COLONOSCOPY  06/2013   mod diverticulosis, 3 tubular adenomas, int hem rpt 5 yrs [facility] Plan)   COLONOSCOPY  06/2018   TA,c olonic angiodysplastic lesion, mod diverticulosis, rpt 5 yrs [facility] Plan)   DEXA  07/2011   normal, T score -0.3   ESOPHAGOGASTRODUODENOSCOPY ENDOSCOPY  2004   normal ([place])   TOTAL ABDOMINAL HYSTERECTOMY W/ BILATERAL SALPINGOOPHORECTOMY  1990s   complete, dysmenorrhea and fibroids    Current Medications: No outpatient medications have been marked as taking for the 04/04/21 encounter (Appointment) with Vickie Epley, MD.     Allergies:   Sulfa antibiotics, Nexletol [bempedoic acid], Tricor [fenofibrate], Zetia [ezetimibe], Contrast media [iodinated diagnostic agents], Lipitor [atorvastatin], and Pravastatin   Social History   Socioeconomic History   Marital status: Married    Spouse name: Ann White   Number of children: 2   Years of education: Not on file   Highest education level: Not on file  Occupational History   Occupation: retired   Tobacco Use   Smoking status: Former    Packs/day: 0.25    Years: 3.00    Pack years: 0.75  Types: Cigarettes    Quit date: 62    Years since quitting: 32.7   Smokeless tobacco: Never   Tobacco comments:    minimal smoking history  Vaping Use   Vaping Use: Never used  Substance and Sexual Activity   Alcohol use: No    Alcohol/week: 0.0 standard drinks   Drug use: No   Sexual activity: Never  Other Topics Concern   Not on file  Social History Narrative   Lives with husband and 2 cats.  2 grown children, 5 grandchildren.   Occupation: retired, worked in [employer]   Activity: no regular exercise.  Does walk in summer   Diet: fruits/vegetables daily, good water.   Social Determinants of Health   Financial Resource Strain: Not on file  Food Insecurity:  Not on file  Transportation Needs: Not on file  Physical Activity: Not on file  Stress: Not on file  Social Connections: Not on file     Family History: The patient's family history includes CAD (age of onset: 32) in her father; CAD (age of onset: 71) in her mother; Diabetes in her father; Hyperlipidemia in her sister; Hypertension in her mother; Parkinson's disease in her father. There is no history of Cancer, Stroke, Colon cancer, Breast cancer, Colon polyps, Esophageal cancer, Stomach cancer, or Rectal cancer.  ROS:   Please see the history of present illness.    All other systems reviewed and are negative.  EKGs/Labs/Other Studies Reviewed:    The following studies were reviewed today: Prior notes  EKG:  The ekg ordered today demonstrates ***  Recent Labs: 03/24/2021: ALT 8; BUN 23; Creatinine, Ser 1.32; Hemoglobin 10.9; Magnesium 2.3; Platelets 192; Potassium 4.6; Sodium 141; TSH 6.760  Recent Lipid Panel    Component Value Date/Time   CHOL 215 (H) 03/24/2021 1047   TRIG 174 (H) 03/24/2021 1047   HDL 52 03/24/2021 1047   CHOLHDL 4.1 03/24/2021 1047   CHOLHDL 3 03/25/2020 0838   VLDL 38.2 03/25/2020 0838   LDLCALC 132 (H) 03/24/2021 1047   LDLDIRECT 132.0 03/18/2019 0916    Physical Exam:    VS:  There were no vitals taken for this visit.    Wt Readings from Last 3 Encounters:  03/24/21 141 lb (64 kg)  02/03/21 140 lb (63.5 kg)  12/30/20 139 lb (63 kg)     GEN: *** Well nourished, well developed in no acute distress HEENT: Normal NECK: No JVD; No carotid bruits LYMPHATICS: No lymphadenopathy CARDIAC: ***RRR, no murmurs, rubs, gallops RESPIRATORY:  Clear to auscultation without rales, wheezing or rhonchi  ABDOMEN: Soft, non-tender, non-distended MUSCULOSKELETAL:  No edema; No deformity  SKIN: Warm and dry NEUROLOGIC:  Alert and oriented x 3 PSYCHIATRIC:  Normal affect   ASSESSMENT:    No diagnosis found. PLAN:    In order of problems listed  above:   Persistent atrial fibrillation On Tikosyn.  QTC stable.  On Eliquis for stroke prophylaxis.  Tolerating this medication well.    2.  Chronic systolic heart failure secondary to nonischemic cardiomyopathy NYHA class II.  Warm and dry.  Rhythm control strategy indicated.  Continue losartan, metoprolol.  3.  CKD 3a BMP as above  4.  High risk medication use Lab work as above.      Follow-up 3 months.     Total time spent with patient today *** minutes. This includes reviewing records, evaluating the patient and coordinating care.   Medication Adjustments/Labs and Tests Ordered: Current medicines are reviewed at length  with the patient today.  Concerns regarding medicines are outlined above.  No orders of the defined types were placed in this encounter.  No orders of the defined types were placed in this encounter.    Signed, Lars Mage, MD, [facility], [facility] 04/03/2021 9:33 PM    Electrophysiology  [facility]

## 2021-04-04 ENCOUNTER — Ambulatory Visit: Payer: Medicare HMO | Admitting: Cardiology

## 2021-04-04 DIAGNOSIS — Z79899 Other long term (current) drug therapy: Secondary | ICD-10-CM

## 2021-04-04 DIAGNOSIS — I4819 Other persistent atrial fibrillation: Secondary | ICD-10-CM

## 2021-04-04 DIAGNOSIS — N183 Chronic kidney disease, stage 3 unspecified: Secondary | ICD-10-CM

## 2021-04-04 DIAGNOSIS — I428 Other cardiomyopathies: Secondary | ICD-10-CM

## 2021-04-10 ENCOUNTER — Other Ambulatory Visit: Payer: Self-pay | Admitting: Family Medicine

## 2021-04-22 ENCOUNTER — Other Ambulatory Visit: Payer: Self-pay | Admitting: Family Medicine

## 2021-05-06 ENCOUNTER — Other Ambulatory Visit: Payer: Self-pay | Admitting: Family Medicine

## 2021-05-09 NOTE — Telephone Encounter (Signed)
ERx 

## 2021-05-11 ENCOUNTER — Other Ambulatory Visit: Payer: Self-pay

## 2021-05-11 ENCOUNTER — Encounter: Payer: Self-pay | Admitting: Cardiology

## 2021-05-11 ENCOUNTER — Ambulatory Visit (INDEPENDENT_AMBULATORY_CARE_PROVIDER_SITE_OTHER): Payer: Medicare HMO | Admitting: Cardiology

## 2021-05-11 VITALS — BP 140/70 | HR 52 | Ht 63.0 in | Wt 141.6 lb

## 2021-05-11 DIAGNOSIS — Z79899 Other long term (current) drug therapy: Secondary | ICD-10-CM | POA: Diagnosis not present

## 2021-05-11 DIAGNOSIS — I4819 Other persistent atrial fibrillation: Secondary | ICD-10-CM | POA: Diagnosis not present

## 2021-05-11 DIAGNOSIS — I5022 Chronic systolic (congestive) heart failure: Secondary | ICD-10-CM | POA: Diagnosis not present

## 2021-05-11 NOTE — Progress Notes (Signed)
Electrophysiology Office Follow up Visit Note:    Date:  05/11/2021   ID:  [name], DOB 11/02/1945, MRN [id number]  PCP:  Ria Bush, MD  [facility] Cardiologist:  Nelva Bush, MD  [facility] Electrophysiologist:  Vickie Epley, MD    Interval History:    Ann White is a 75 y.o. female who presents for a follow up visit. They were last seen in clinic December 30, 2020.  She is maintained on dofetilide.  She is on Eliquis for stroke prophylaxis.       Past Medical History:  Diagnosis Date   Allergy    Anxiety    Arthritis    Carotid stenosis    a. 02/2016 Carotid U/S: Bilateral 40-59%; b. 03/2017 Carotid U/S: RICA 32-35%, LICA 5-73%; c. 22/0254 Carotid U/S: bilat 40-59% ICA dzs.   Cervical spondylosis    s/p spine injections   CKD (chronic kidney disease), stage III (HCC)    Depression    Ectatic abdominal aorta ([place]) 2016   rpt [place] 5 yrs   GERD (gastroesophageal reflux disease)    History of chicken pox    History of diverticulitis of colon    HLD (hyperlipidemia)    HTN (hypertension)    Irritable bowel syndrome with constipation    Lichen sclerosus et atrophicus    NICM (nonischemic cardiomyopathy) ([place])    a. 04/2017 Echo: EF 35-40%, diff HK; b. 05/2017 MV: EF 56%, no ischemia/infarct; c. 04/2018 Echo: EF 55-60%, no rwma, Gr2 DD; d. 03/2020 Echo: EF 40-45%, nl PASP, mod dil LA, mild to mod dil RA, Triv MR.   Obesity    Persistent atrial fibrillation ([place])    a. Dx 03/2020. CHA2DS2VASc = 5-->xarelto 15mg  daily.   Premature atrial contraction    a. 04/2017 48hr Holter: Predominant rhythm - sinus. Rare PVC's, freq PAC's with freq atrial runs up to 34 beats-->metoprolol started.   Seasonal allergic rhinitis     Past Surgical History:  Procedure Laterality Date   bladder tack  1990s   BREAST BIOPSY Right 03/05/2012    benign   CARDIOVERSION N/A 05/17/2020   Procedure: CARDIOVERSION;  Surgeon: Nelva Bush, MD;  Location: [facility]  ORS;  Service: Cardiovascular;  Laterality: N/A;   CARPAL TUNNEL RELEASE  2004, 2013   bilateral   CHOLECYSTECTOMY  1990s   COLONOSCOPY  2002   COLONOSCOPY  06/2013   mod diverticulosis, 3 tubular adenomas, int hem rpt 5 yrs [facility])   COLONOSCOPY  06/2018   TA,c olonic angiodysplastic lesion, mod diverticulosis, rpt 5 yrs [facility] Plan)   DEXA  07/2011   normal, T score -0.3   ESOPHAGOGASTRODUODENOSCOPY ENDOSCOPY  2004   normal ([facility])   TOTAL ABDOMINAL HYSTERECTOMY W/ BILATERAL SALPINGOOPHORECTOMY  1990s   complete, dysmenorrhea and fibroids    Current Medications: Current Meds  Medication Sig   acetaminophen (TYLENOL) 500 MG tablet Take 500-1,000 mg by mouth every 6 (six) hours as needed (knee pain.).   apixaban (ELIQUIS) 5 MG TABS tablet Take 5 mg by mouth 2 (two) times daily.   cetirizine (ZYRTEC) 10 MG tablet Take 10 mg by mouth daily.    Cholecalciferol (VITAMIN D3) 1000 UNITS CAPS Take 1 capsule (1,000 Units total) by mouth daily.   clonazePAM (KLONOPIN) 1 MG tablet Take 1 tablet by mouth twice daily as needed   docusate sodium (COLACE) 100 MG capsule Take 100-200 mg by mouth daily as needed (constipation.).   dofetilide (TIKOSYN) 125 MCG capsule TAKE 1 CAPSULE BY  MOUTH TWO TIMES A DAY   Evolocumab (REPATHA SURECLICK) 341 MG/ML SOAJ Inject 1 pen into the skin every 14 (fourteen) days.   gabapentin (NEURONTIN) 300 MG capsule TAKE ONE CAPSULE EVERY MORNING AND 2 CAPSULES AT BEDTIME   losartan (COZAAR) 25 MG tablet Take 1 tablet by mouth once daily   Multiple Vitamin (MULTIVITAMIN WITH MINERALS) TABS tablet Take 1 tablet by mouth daily.   oxymetazoline (AFRIN) 0.05 % nasal spray Place 1 spray into both nostrils 2 (two) times daily as needed for congestion.   pantoprazole (PROTONIX) 40 MG tablet Take 1 tablet by mouth once daily   rosuvastatin (CRESTOR) 5 MG tablet Take 1 tablet (5 mg total) by mouth 3 (three) times a week.   sertraline (ZOLOFT) 100 MG tablet Take 1 tablet by mouth once  daily   traMADol (ULTRAM) 50 MG tablet TAKE 1 TABLET BY MOUTH THREE TIMES DAILY AS NEEDED FOR  KNEE  PAIN   vitamin B-12 (CYANOCOBALAMIN) 500 MCG tablet Take 500 mcg by mouth daily.      Allergies:   Sulfa antibiotics, Nexletol [bempedoic acid], Tricor [fenofibrate], Zetia [ezetimibe], Contrast media [iodinated diagnostic agents], Lipitor [atorvastatin], and Pravastatin   Social History   Socioeconomic History   Marital status: Married    Spouse name: Freddie   Number of children: 2   Years of education: Not on file   Highest education level: Not on file  Occupational History   Occupation: retired   Tobacco Use   Smoking status: Former    Packs/day: 0.25    Years: 3.00    Pack years: 0.75    Types: Cigarettes    Quit date: 1990    Years since quitting: 32.8   Smokeless tobacco: Never   Tobacco comments:    minimal smoking history  Vaping Use   Vaping Use: Never used  Substance and Sexual Activity   Alcohol use: No    Alcohol/week: 0.0 standard drinks   Drug use: No   Sexual activity: Never  Other Topics Concern   Not on file  Social History Narrative   Lives with husband and 2 cats.  2 grown children, 5 grandchildren.   Occupation: retired, worked in [employer]   Activity: no regular exercise.  Does walk in summer   Diet: fruits/vegetables daily, good water.   Social Determinants of Health   Financial Resource Strain: Not on file  Food Insecurity: Not on file  Transportation Needs: Not on file  Physical Activity: Not on file  Stress: Not on file  Social Connections: Not on file     Family History: The patient's family history includes CAD (age of onset: 58) in her father; CAD (age of onset: 38) in her mother; Diabetes in her father; Hyperlipidemia in her sister; Hypertension in her mother; Parkinson's disease in her father. There is no history of Cancer, Stroke, Colon cancer, Breast cancer, Colon polyps, Esophageal cancer, Stomach cancer, or Rectal cancer.  ROS:    Please see the history of present illness.    All other systems reviewed and are negative.  EKGs/Labs/Other Studies Reviewed:    The following studies were reviewed today:  March 24, 2021 creatinine 1.3, potassium 4.6, mag 2.3  EKG:  The ekg ordered today demonstrates sinus rhythm, first-degree AV delay, PAC  Recent Labs: 03/24/2021: ALT 8; BUN 23; Creatinine, Ser 1.32; Hemoglobin 10.9; Magnesium 2.3; Platelets 192; Potassium 4.6; Sodium 141; TSH 6.760  Recent Lipid Panel    Component Value Date/Time   CHOL 215 (H)  03/24/2021 1047   TRIG 174 (H) 03/24/2021 1047   HDL 52 03/24/2021 1047   CHOLHDL 4.1 03/24/2021 1047   CHOLHDL 3 03/25/2020 0838   VLDL 38.2 03/25/2020 0838   LDLCALC 132 (H) 03/24/2021 1047   LDLDIRECT 132.0 03/18/2019 0916    Physical Exam:    VS:  BP 140/70   Pulse (!) 52   Ht 5\' 3"  (1.6 m)   Wt 141 lb 9.6 oz (64.2 kg)   SpO2 98%   BMI 25.08 kg/m     Wt Readings from Last 3 Encounters:  05/11/21 141 lb 9.6 oz (64.2 kg)  03/24/21 141 lb (64 kg)  02/03/21 140 lb (63.5 kg)     GEN:  Well nourished, well developed in no acute distress HEENT: Normal NECK: No JVD; No carotid bruits LYMPHATICS: No lymphadenopathy CARDIAC: RRR, no murmurs, rubs, gallops RESPIRATORY:  Clear to auscultation without rales, wheezing or rhonchi  ABDOMEN: Soft, non-tender, non-distended MUSCULOSKELETAL:  No edema; No deformity  SKIN: Warm and dry NEUROLOGIC:  Alert and oriented x 3 PSYCHIATRIC:  Normal affect        ASSESSMENT:    1. Persistent atrial fibrillation ([place])   2. Chronic HFrEF (heart failure with reduced ejection fraction) ([place])   3. Encounter for long-term (current) use of high-risk medication    PLAN:    In order of problems listed above:   #Persistent atrial fibrillation #Encounter for long-term use of high-risk medication  Doing well on dofetilide.  Maintaining sinus rhythm.  QTC is acceptable today.  Continue this dose and Eliquis for stroke  prophylaxis.  We will plan to see her back in 3 months with lab work at that time.  #Chronic systolic heart failure NYHA class II today.  Warm and dry on exam.  Continue rhythm control strategy.  Continue losartan.  Follow-up with APP in 3 months with lab work.  Follow-up with me in 6 months.    Medication Adjustments/Labs and Tests Ordered: Current medicines are reviewed at length with the patient today.  Concerns regarding medicines are outlined above.  No orders of the defined types were placed in this encounter.  No orders of the defined types were placed in this encounter.    Signed, Lars Mage, MD, [facility], [facility] 05/11/2021 10:51 AM    Electrophysiology [facility]

## 2021-05-11 NOTE — Patient Instructions (Addendum)
Medication Instructions:  Your physician recommends that you continue on your current medications as directed. Please refer to the Current Medication list given to you today. *If you need a refill on your cardiac medications before your next appointment, please call your pharmacy*  Lab Work: None ordered. If you have labs (blood work) drawn today and your tests are completely normal, you will receive your results only by: [place] (if you have MyChart) OR A paper copy in the mail If you have any lab test that is abnormal or we need to change your treatment, we will call you to review the results.  Testing/Procedures: None ordered.  Follow-Up: At [facility], you and your health needs are our priority.  As part of our continuing mission to provide you with exceptional heart care, we have created designated Provider Care Teams.  These Care Teams include your primary Cardiologist (physician) and Advanced Practice Providers (APPs -  Physician Assistants and Nurse Practitioners) who all work together to provide you with the care you need, when you need it.  Your next appointment:   Your physician wants you to follow-up in: 3 months with one of the following Advanced Practice Providers on your designated Care Team:   Tommye Standard, [place] Legrand Como "[facility]" [place], [place]

## 2021-06-12 ENCOUNTER — Telehealth: Payer: Self-pay | Admitting: Cardiology

## 2021-06-12 NOTE — Telephone Encounter (Signed)
This patient is on Eliquis and sertraline so is at an increased risk of bleeding at baseline. Cystex contains NSAIDs that can increase this bleeding risk, however given the expected short term use of this product the patient can take it for UTI pain if she is willing to accept the increased risk of bleeding. However, the patient should additionally see her doctor for antibiotics to treat the UTI if she hasn't already.  Cathrine Muster, PharmD, [place] PGY2 Cardiology Pharmacy Resident 06/12/2021  4:52 PM

## 2021-06-12 NOTE — Telephone Encounter (Signed)
Advised Pt of increased risk of bleeding.  Pt plans to take medication for a day or 2.   She has not seen a doctor in regards to taking an antibiotic.

## 2021-06-12 NOTE — Telephone Encounter (Signed)
Pt c/o medication issue:  1. Name of Medication: Cystex   2. How are you currently taking this medication (dosage and times per day)? Not currently taking   3. Are you having a reaction (difficulty breathing--STAT)? No   4. What is your medication issue? Lenor is calling wanting to know if it is okay for her to take Cystex for a UTI.

## 2021-06-16 ENCOUNTER — Ambulatory Visit (INDEPENDENT_AMBULATORY_CARE_PROVIDER_SITE_OTHER): Payer: Medicare HMO | Admitting: Nurse Practitioner

## 2021-06-16 ENCOUNTER — Other Ambulatory Visit: Payer: Self-pay

## 2021-06-16 VITALS — BP 128/84 | HR 47 | Temp 96.8°F | Ht 63.0 in | Wt 142.2 lb

## 2021-06-16 DIAGNOSIS — R300 Dysuria: Secondary | ICD-10-CM | POA: Diagnosis not present

## 2021-06-16 DIAGNOSIS — N3001 Acute cystitis with hematuria: Secondary | ICD-10-CM | POA: Diagnosis not present

## 2021-06-16 LAB — POCT URINALYSIS DIP (CLINITEK)
Glucose, UA: NEGATIVE mg/dL
Ketones, POC UA: NEGATIVE mg/dL
Nitrite, UA: NEGATIVE
Spec Grav, UA: >=1.03 — AB (ref 1.010–1.025)
Urobilinogen, UA: 0.2 E.U./dL
pH, UA: 5.5 (ref 5.0–8.0)

## 2021-06-16 LAB — URINALYSIS, MICROSCOPIC ONLY

## 2021-06-16 MED ORDER — CEPHALEXIN 500 MG PO CAPS
500.0000 mg | ORAL_CAPSULE | Freq: Four times a day (QID) | ORAL | 0 refills | Status: AC
Start: 2021-06-16 — End: 2021-06-21

## 2021-06-16 NOTE — Assessment & Plan Note (Signed)
UA in office indicative of urinary tract infection given patient is on Tikosyn and has allergies to sulfa and GFR less than 60 we will send in Keflex for her urinary tract infection pending urine culture.  Follow-up if symptoms do not improve. Sent off for microscopy continue to having blood in urine to see if this is a true finding or not pending lab results

## 2021-06-16 NOTE — Progress Notes (Signed)
Acute Office Visit  Subjective:    Patient ID: Ann White, female    DOB: [birth year], 75 y.o.   MRN: [id number]  Chief Complaint  Patient presents with   Dysuria    X 1 week. Pressure and pain with urination, frequency and urgency present.      Patient is in today for Dysuria  Started approx 1 week ago. Dysuria with frequency and urgency. UTI in the past History of Afib called clinic and they said she she could take  Has tried AZO, 2 pills on one day and then 1 pill on the next. Did help some with the dysuria.   Past Medical History:  Diagnosis Date   Allergy    Anxiety    Arthritis    Carotid stenosis    a. 02/2016 Carotid U/S: Bilateral 40-59%; b. 03/2017 Carotid U/S: RICA 76-16%, LICA 0-73%; c. 71/0626 Carotid U/S: bilat 40-59% ICA dzs.   Cervical spondylosis    s/p spine injections   CKD (chronic kidney disease), stage III (HCC)    Depression    Ectatic abdominal aorta ([place]) 2016   rpt [place] 5 yrs   GERD (gastroesophageal reflux disease)    History of chicken pox    History of diverticulitis of colon    HLD (hyperlipidemia)    HTN (hypertension)    Irritable bowel syndrome with constipation    Lichen sclerosus et atrophicus    NICM (nonischemic cardiomyopathy) ([place])    a. 04/2017 Echo: EF 35-40%, diff HK; b. 05/2017 MV: EF 56%, no ischemia/infarct; c. 04/2018 Echo: EF 55-60%, no rwma, Gr2 DD; d. 03/2020 Echo: EF 40-45%, nl PASP, mod dil LA, mild to mod dil RA, Triv MR.   Obesity    Persistent atrial fibrillation ([place])    a. Dx 03/2020. CHA2DS2VASc = 5-->xarelto 28m daily.   Premature atrial contraction    a. 04/2017 48hr Holter: Predominant rhythm - sinus. Rare PVC's, freq PAC's with freq atrial runs up to 34 beats-->metoprolol started.   Seasonal allergic rhinitis     Past Surgical History:  Procedure Laterality Date   bladder tack  1990s   BREAST BIOPSY Right 03/05/2012    benign   CARDIOVERSION N/A 05/17/2020   Procedure: CARDIOVERSION;  Surgeon:  ENelva Bush MD;  Location: [facility] ORS;  Service: Cardiovascular;  Laterality: N/A;   CARPAL TUNNEL RELEASE  2004, 2013   bilateral   CHOLECYSTECTOMY  1990s   COLONOSCOPY  2002   COLONOSCOPY  06/2013   mod diverticulosis, 3 tubular adenomas, int hem rpt 5 yrs ([place]   COLONOSCOPY  06/2018   TA,c olonic angiodysplastic lesion, mod diverticulosis, rpt 5 yrs ([facility]   DEXA  07/2011   normal, T score -0.3   ESOPHAGOGASTRODUODENOSCOPY ENDOSCOPY  2004   normal ([place])   TOTAL ABDOMINAL HYSTERECTOMY W/ BILATERAL SALPINGOOPHORECTOMY  1990s   complete, dysmenorrhea and fibroids    Family History  Problem Relation Age of Onset   CAD Mother 62      MI   Hypertension Mother    Parkinson's disease Father    Diabetes Father    CAD Father 646      MI   Hyperlipidemia Sister    Cancer Neg Hx    Stroke Neg Hx    Colon cancer Neg Hx    Breast cancer Neg Hx    Colon polyps Neg Hx    Esophageal cancer Neg Hx    Stomach cancer Neg Hx    Rectal cancer  Neg Hx     Social History   Socioeconomic History   Marital status: Married    Spouse name: Freddie   Number of children: 2   Years of education: Not on file   Highest education level: Not on file  Occupational History   Occupation: retired   Tobacco Use   Smoking status: Former    Packs/day: 0.25    Years: 3.00    Pack years: 0.75    Types: Cigarettes    Quit date: 1990    Years since quitting: 32.9   Smokeless tobacco: Never   Tobacco comments:    minimal smoking history  Vaping Use   Vaping Use: Never used  Substance and Sexual Activity   Alcohol use: No    Alcohol/week: 0.0 standard drinks   Drug use: No   Sexual activity: Never  Other Topics Concern   Not on file  Social History Narrative   Lives with husband and 2 cats.  2 grown children, 5 grandchildren.   Occupation: retired, worked in [employer]   Activity: no regular exercise.  Does walk in summer   Diet: fruits/vegetables daily, good water.   Social Determinants  of Health   Financial Resource Strain: Not on file  Food Insecurity: Not on file  Transportation Needs: Not on file  Physical Activity: Not on file  Stress: Not on file  Social Connections: Not on file  Intimate Partner Violence: Not on file    Outpatient Medications Prior to Visit  Medication Sig Dispense Refill   acetaminophen (TYLENOL) 500 MG tablet Take 500-1,000 mg by mouth every 6 (six) hours as needed (knee pain.).     apixaban (ELIQUIS) 5 MG TABS tablet Take 5 mg by mouth 2 (two) times daily.     cetirizine (ZYRTEC) 10 MG tablet Take 10 mg by mouth daily.      Cholecalciferol (VITAMIN D3) 1000 UNITS CAPS Take 1 capsule (1,000 Units total) by mouth daily.     clonazePAM (KLONOPIN) 1 MG tablet Take 1 tablet by mouth twice daily as needed 60 tablet 0   docusate sodium (COLACE) 100 MG capsule Take 100-200 mg by mouth daily as needed (constipation.).     dofetilide (TIKOSYN) 125 MCG capsule TAKE 1 CAPSULE BY MOUTH TWO TIMES A DAY 60 capsule 2   Evolocumab (REPATHA SURECLICK) 540 MG/ML SOAJ Inject 1 pen into the skin every 14 (fourteen) days. 6 mL 3   gabapentin (NEURONTIN) 300 MG capsule TAKE ONE CAPSULE EVERY MORNING AND 2 CAPSULES AT BEDTIME 270 capsule 2   losartan (COZAAR) 25 MG tablet Take 1 tablet by mouth once daily 90 tablet 1   Multiple Vitamin (MULTIVITAMIN WITH MINERALS) TABS tablet Take 1 tablet by mouth daily.     oxymetazoline (AFRIN) 0.05 % nasal spray Place 1 spray into both nostrils 2 (two) times daily as needed for congestion.     pantoprazole (PROTONIX) 40 MG tablet Take 1 tablet by mouth once daily 90 tablet 0   rosuvastatin (CRESTOR) 5 MG tablet Take 1 tablet (5 mg total) by mouth 3 (three) times a week. 36 tablet 3   sertraline (ZOLOFT) 100 MG tablet Take 1 tablet by mouth once daily 90 tablet 0   traMADol (ULTRAM) 50 MG tablet TAKE 1 TABLET BY MOUTH THREE TIMES DAILY AS NEEDED FOR  KNEE  PAIN 120 tablet 0   vitamin B-12 (CYANOCOBALAMIN) 500 MCG tablet Take 500  mcg by mouth daily.      No facility-administered medications prior  to visit.    Allergies  Allergen Reactions   Sulfa Antibiotics Swelling    Oral swelling   Nexletol [Bempedoic Acid]     Dizziness, "eyes felt funny"   Tricor [Fenofibrate] Other (See Comments)    myalgias   Zetia [Ezetimibe] Other (See Comments)    Dizziness   Contrast Media [Iodinated Diagnostic Agents] Other (See Comments)    Oral contrast caused mouth blisters   Lipitor [Atorvastatin] Other (See Comments)    myalgias   Pravastatin Other (See Comments)    myalgias    Review of Systems  Constitutional:  Negative for chills and fever.  Respiratory:  Negative for cough and shortness of breath.   Cardiovascular:  Negative for chest pain.  Gastrointestinal:  Positive for constipation. Negative for abdominal pain, diarrhea, nausea and vomiting.  Genitourinary:  Positive for dysuria, frequency and urgency. Negative for hematuria.  Musculoskeletal:  Negative for back pain and myalgias.      Objective:    Physical Exam Vitals and nursing note reviewed.  Constitutional:      Appearance: Normal appearance.  Cardiovascular:     Rate and Rhythm: Normal rate and regular rhythm.  Pulmonary:     Effort: Pulmonary effort is normal.     Breath sounds: Normal breath sounds.  Abdominal:     General: Bowel sounds are normal. There is no distension.     Tenderness: There is no abdominal tenderness. There is no right CVA tenderness or left CVA tenderness.  Neurological:     Mental Status: She is alert.  Psychiatric:        Mood and Affect: Mood normal.        Behavior: Behavior normal.        Thought Content: Thought content normal.        Judgment: Judgment normal.    BP 128/84   Pulse (!) 47   Temp (!) 96.8 F (36 C)   Ht _0  (1.6 m)   Wt 142 lb 4 oz (64.5 kg)   SpO2 99%   BMI 25.20 kg/m  Wt Readings from Last 3 Encounters:  06/16/21 142 lb 4 oz (64.5 kg)  05/11/21 141 lb 9.6 oz (64.2 kg)  03/24/21  141 lb (64 kg)    Health Maintenance Due  Topic Date Due   Zoster Vaccines- Shingrix (1 of 2) Never done   COLON CANCER SCREENING ANNUAL FOBT  04/01/2021    There are no preventive care reminders to display for this patient.   Lab Results  Component Value Date   TSH 6.760 (H) 03/24/2021   Lab Results  Component Value Date   WBC 3.5 03/24/2021   HGB 10.9 (L) 03/24/2021   HCT 33.4 (L) 03/24/2021   MCV 91 03/24/2021   PLT 192 03/24/2021   Lab Results  Component Value Date   NA 141 03/24/2021   K 4.6 03/24/2021   CO2 20 03/24/2021   GLUCOSE 83 03/24/2021   BUN 23 03/24/2021   CREATININE 1.32 (H) 03/24/2021   BILITOT 0.3 03/24/2021   ALKPHOS 45 03/24/2021   AST 20 03/24/2021   ALT 8 03/24/2021   PROT 7.6 03/24/2021   ALBUMIN 5.2 (H) 03/24/2021   CALCIUM 9.9 03/24/2021   ANIONGAP 10 08/25/2020   EGFR 43 (L) 03/24/2021   GFR 34.85 (L) 03/25/2020   Lab Results  Component Value Date   CHOL 215 (H) 03/24/2021   Lab Results  Component Value Date   HDL 52 03/24/2021   Lab Results  Component Value Date   LDLCALC 132 (H) 03/24/2021   Lab Results  Component Value Date   TRIG 174 (H) 03/24/2021   Lab Results  Component Value Date   CHOLHDL 4.1 03/24/2021   Lab Results  Component Value Date   HGBA1C 6.3 03/25/2020       Assessment & Plan:   Problem List Items Addressed This Visit       Genitourinary   Acute cystitis with hematuria    UA in office indicative of urinary tract infection given patient is on Tikosyn and has allergies to sulfa and GFR less than 60 we will send in Keflex for her urinary tract infection pending urine culture.  Follow-up if symptoms do not improve. Sent off for microscopy continue to having blood in urine to see if this is a true finding or not pending lab results      Relevant Medications   cephALEXin (KEFLEX) 500 MG capsule   Other Relevant Orders   Urinalysis, microscopic only (Completed)     Other   Dysuria - Primary    Relevant Orders   POCT URINALYSIS DIP (CLINITEK) (Completed)   Urine Culture   Urinalysis, microscopic only (Completed)     No orders of the defined types were placed in this encounter.  This visit occurred during the SARS-CoV-2 public health emergency.  Safety protocols were in place, including screening questions prior to the visit, additional usage of staff PPE, and extensive cleaning of exam room while observing appropriate contact time as indicated for disinfecting solutions.   Romilda Garret, NP

## 2021-06-16 NOTE — Patient Instructions (Signed)
Nice to see you Sent antibiotic to pharmacy on file Follow up if symptoms fail to improve or worsen

## 2021-06-18 LAB — URINE CULTURE
MICRO NUMBER:: 12707591
SPECIMEN QUALITY:: ADEQUATE

## 2021-06-21 ENCOUNTER — Telehealth: Payer: Self-pay | Admitting: Pharmacist

## 2021-06-21 DIAGNOSIS — E7849 Other hyperlipidemia: Secondary | ICD-10-CM

## 2021-06-21 NOTE — Telephone Encounter (Signed)
Called patient to set up lipid labs since adding rosuvastatin 5mg  three times a day. Patient normally goes to [facility] for labs. Orders entered and released.

## 2021-06-21 NOTE — Telephone Encounter (Signed)
Patient returned call- she will go to [facility] for fasting lipids

## 2021-06-23 ENCOUNTER — Other Ambulatory Visit
Admission: RE | Admit: 2021-06-23 | Discharge: 2021-06-23 | Disposition: A | Discharge status: Home / Self Care | Payer: Medicare HMO | Attending: Pharmacist | Admitting: Pharmacist

## 2021-06-23 DIAGNOSIS — E7849 Other hyperlipidemia: Secondary | ICD-10-CM | POA: Insufficient documentation

## 2021-06-23 LAB — LIPID PANEL
Cholesterol: 132 mg/dL (ref 0–200)
HDL: 59 mg/dL (ref >40)
LDL Cholesterol: 52 mg/dL (ref 0–99)
Total CHOL/HDL Ratio: 2.2 RATIO
Triglycerides: 103 mg/dL (ref <150)
VLDL: 21 mg/dL (ref 0–40)

## 2021-06-23 LAB — HEPATIC FUNCTION PANEL
ALT: 13 U/L (ref 0–44)
AST: 25 U/L (ref 15–41)
Albumin: 4.7 g/dL (ref 3.5–5.0)
Alkaline Phosphatase: 38 U/L (ref 38–126)
Bilirubin, Direct: <0.1 mg/dL (ref 0.0–0.2)
Total Bilirubin: 0.6 mg/dL (ref 0.3–1.2)
Total Protein: 7.9 g/dL (ref 6.5–8.1)

## 2021-06-23 NOTE — Addendum Note (Signed)
Addended by: Willeen Cass A on: 06/23/2021 10:17 AM   Modules accepted: Orders

## 2021-07-03 ENCOUNTER — Other Ambulatory Visit: Payer: Self-pay | Admitting: Family Medicine

## 2021-07-03 ENCOUNTER — Telehealth: Payer: Self-pay | Admitting: Internal Medicine

## 2021-07-03 NOTE — Telephone Encounter (Signed)
Spoke with patient regarding her application for assistance. She brought in prescription costs for this year 2022 and discussed application requirements for 2023. Advised she needs to spend 3% of her income, 1040 or social security statements, for both her and her spouse. Advised they most likely will not meet that requirement until April or May of next year. Discussed that I would mail her documents back to her but no need to complete until she meets those requirements. She verbalized understanding of our conversation with no further questions at this time.

## 2021-07-03 NOTE — Telephone Encounter (Signed)
Patient came by office  Dropped off patient assistance forms to be completed  Please advise if there is more information needed Placed in nurse box

## 2021-07-03 NOTE — Telephone Encounter (Signed)
Left voicemail message to call back for review of information and application.

## 2021-07-04 ENCOUNTER — Other Ambulatory Visit: Payer: Self-pay

## 2021-07-04 ENCOUNTER — Other Ambulatory Visit: Payer: Self-pay | Admitting: Family Medicine

## 2021-07-04 MED ORDER — DOFETILIDE 125 MCG PO CAPS: ORAL_CAPSULE | ORAL | 3 refills | Status: DC

## 2021-07-04 NOTE — Telephone Encounter (Signed)
Name of Medication: Tramadol Name of Pharmacy: [facility] Last Fill or Written Date and Quantity: 03/07/21, #120 Last Office Visit and Type: 03/30/20, AWV prt 2 Next Office Visit and Type: 07/19/21, AWV prt 2 Last Controlled Substance Agreement Date: 03/16/15 Last UDS: 03/16/15

## 2021-07-05 NOTE — Telephone Encounter (Signed)
ERx 

## 2021-07-07 NOTE — Progress Notes (Signed)
Subjective:   Ann White is a 75 y.o. female who presents for Medicare Annual (Subsequent) preventive examination.  I connected with Greta Doom today by telephone and verified that I am speaking with the correct person using two identifiers. Location patient: home Location provider: work Persons participating in the virtual visit: patient, Marine scientist.    I discussed the limitations, risks, security and privacy concerns of performing an evaluation and management service by telephone and the availability of in person appointments. I also discussed with the patient that there may be a patient responsible charge related to this service. The patient expressed understanding and verbally consented to this telephonic visit.    Interactive audio and video telecommunications were attempted between this provider and patient, however failed, due to patient having technical difficulties OR patient did not have access to video capability.  We continued and completed visit with audio only.  Some vital signs may be absent or patient reported.   Time Spent with patient on telephone encounter: 20 minutes  Review of Systems     Cardiac Risk Factors include: advanced age (>7men, >49 women);hypertension;dyslipidemia     Objective:    Today's Vitals   07/12/21 0944  Weight: 138 lb (62.6 kg)  Height: 5\' 3"  (1.6 m)   Body mass index is 24.45 kg/m.  Advanced Directives 07/12/2021 08/08/2020 05/17/2020 03/25/2020 03/06/2018 02/26/2017 02/21/2016  Does Patient Have a Medical Advance Directive? No No No Yes Yes Yes Yes  Type of Advance Directive - - Public librarian;Living will [place];Living will [place];Living will [place];Living will  Does patient want to make changes to medical advance directive? Yes (MAU/Ambulatory/Procedural Areas - Information given) - - - - - -  Copy of [place] in Chart? - - - No - copy  requested No - copy requested No - copy requested No - copy requested  Would patient like information on creating a medical advance directive? - Yes (Inpatient - patient requests chaplain consult to create a medical advance directive) Yes (MAU/Ambulatory/Procedural Areas - Information given) - - - -    Current Medications (verified) Outpatient Encounter Medications as of 07/12/2021  Medication Sig   acetaminophen (TYLENOL) 500 MG tablet Take 500-1,000 mg by mouth every 6 (six) hours as needed (knee pain.).   apixaban (ELIQUIS) 5 MG TABS tablet Take 5 mg by mouth 2 (two) times daily.   cetirizine (ZYRTEC) 10 MG tablet Take 10 mg by mouth daily.    Cholecalciferol (VITAMIN D3) 1000 UNITS CAPS Take 1 capsule (1,000 Units total) by mouth daily.   clonazePAM (KLONOPIN) 1 MG tablet Take 1 tablet by mouth twice daily as needed   docusate sodium (COLACE) 100 MG capsule Take 100-200 mg by mouth daily as needed (constipation.).   dofetilide (TIKOSYN) 125 MCG capsule TAKE 1 CAPSULE BY MOUTH TWO TIMES A DAY   Evolocumab (REPATHA SURECLICK) 017 MG/ML SOAJ Inject 1 pen into the skin every 14 (fourteen) days.   gabapentin (NEURONTIN) 300 MG capsule TAKE ONE CAPSULE EVERY MORNING AND 2 CAPSULES AT BEDTIME   losartan (COZAAR) 25 MG tablet Take 1 tablet by mouth once daily   Multiple Vitamin (MULTIVITAMIN WITH MINERALS) TABS tablet Take 1 tablet by mouth daily.   oxymetazoline (AFRIN) 0.05 % nasal spray Place 1 spray into both nostrils 2 (two) times daily as needed for congestion.   pantoprazole (PROTONIX) 40 MG tablet Take 1 tablet by mouth once daily  rosuvastatin (CRESTOR) 5 MG tablet Take 1 tablet (5 mg total) by mouth 3 (three) times a week.   sertraline (ZOLOFT) 100 MG tablet Take 1 tablet by mouth once daily   traMADol (ULTRAM) 50 MG tablet TAKE 1 TABLET BY MOUTH THREE TIMES DAILY AS NEEDED FOR  KNEE  PAIN   vitamin B-12 (CYANOCOBALAMIN) 500 MCG tablet Take 500 mcg by mouth daily.    No  facility-administered encounter medications on file as of 07/12/2021.    Allergies (verified) Sulfa antibiotics, Nexletol [bempedoic acid], Tricor [fenofibrate], Zetia [ezetimibe], Contrast media [iodinated contrast media], Lipitor [atorvastatin], and Pravastatin   History: Past Medical History:  Diagnosis Date   Allergy    Anxiety    Arthritis    Carotid stenosis    a. 02/2016 Carotid U/S: Bilateral 40-59%; b. 03/2017 Carotid U/S: RICA 16-10%, LICA 9-60%; c. 45/4098 Carotid U/S: bilat 40-59% ICA dzs.   Cervical spondylosis    s/p spine injections   CKD (chronic kidney disease), stage III (HCC)    Depression    Ectatic abdominal aorta ([place]) 2016   rpt [place] 5 yrs   GERD (gastroesophageal reflux disease)    History of chicken pox    History of diverticulitis of colon    HLD (hyperlipidemia)    HTN (hypertension)    Irritable bowel syndrome with constipation    Lichen sclerosus et atrophicus    NICM (nonischemic cardiomyopathy) ([place])    a. 04/2017 Echo: EF 35-40%, diff HK; b. 05/2017 MV: EF 56%, no ischemia/infarct; c. 04/2018 Echo: EF 55-60%, no rwma, Gr2 DD; d. 03/2020 Echo: EF 40-45%, nl PASP, mod dil LA, mild to mod dil RA, Triv MR.   Obesity    Persistent atrial fibrillation ([place])    a. Dx 03/2020. CHA2DS2VASc = 5-->xarelto 15mg  daily.   Premature atrial contraction    a. 04/2017 48hr Holter: Predominant rhythm - sinus. Rare PVC's, freq PAC's with freq atrial runs up to 34 beats-->metoprolol started.   Seasonal allergic rhinitis    Past Surgical History:  Procedure Laterality Date   bladder tack  1990s   BREAST BIOPSY Right 03/05/2012    benign   CARDIOVERSION N/A 05/17/2020   Procedure: CARDIOVERSION;  Surgeon: Nelva Bush, MD;  Location: [facility] ORS;  Service: Cardiovascular;  Laterality: N/A;   CARPAL TUNNEL RELEASE  2004, 2013   bilateral   CHOLECYSTECTOMY  1990s   COLONOSCOPY  2002   COLONOSCOPY  06/2013   mod diverticulosis, 3 tubular adenomas, int hem rpt 5 yrs  [facility])   COLONOSCOPY  06/2018   TA,c olonic angiodysplastic lesion, mod diverticulosis, rpt 5 yrs [facility])   DEXA  07/2011   normal, T score -0.3   ESOPHAGOGASTRODUODENOSCOPY ENDOSCOPY  2004   normal ([place])   TOTAL ABDOMINAL HYSTERECTOMY W/ BILATERAL SALPINGOOPHORECTOMY  1990s   complete, dysmenorrhea and fibroids   Family History  Problem Relation Age of Onset   CAD Mother 47       MI   Hypertension Mother    Parkinson's disease Father    Diabetes Father    CAD Father 75       MI   Hyperlipidemia Sister    Cancer Neg Hx    Stroke Neg Hx    Colon cancer Neg Hx    Breast cancer Neg Hx    Colon polyps Neg Hx    Esophageal cancer Neg Hx    Stomach cancer Neg Hx    Rectal cancer Neg Hx    Social History  Socioeconomic History   Marital status: Married    Spouse name: Freddie   Number of children: 2   Years of education: Not on file   Highest education level: Not on file  Occupational History   Occupation: retired   Tobacco Use   Smoking status: Former    Packs/day: 0.25    Years: 3.00    Pack years: 0.75    Types: Cigarettes    Quit date: 1990    Years since quitting: 33.0   Smokeless tobacco: Never   Tobacco comments:    minimal smoking history  Vaping Use   Vaping Use: Never used  Substance and Sexual Activity   Alcohol use: No    Alcohol/week: 0.0 standard drinks   Drug use: No   Sexual activity: Never  Other Topics Concern   Not on file  Social History Narrative   Lives with husband and 2 cats.  2 grown children, 5 grandchildren.   Occupation: retired, worked in [employer]   Activity: no regular exercise.  Does walk in summer   Diet: fruits/vegetables daily, good water.   Social Determinants of Health   Financial Resource Strain: Low Risk    Difficulty of Paying Living Expenses: Not very hard  Food Insecurity: No Food Insecurity   Worried About Charity fundraiser in the Last Year: Never true   Ran Out of Food in the Last Year: Never true   Transportation Needs: No Transportation Needs   Lack of Transportation (Medical): No   Lack of Transportation (Non-Medical): No  Physical Activity: Inactive   Days of Exercise per Week: 0 days   Minutes of Exercise per Session: 0 min  Stress: No Stress Concern Present   Feeling of Stress : Not at all  Social Connections: Moderately Integrated   Frequency of Communication with Friends and Family: More than three times a week   Frequency of Social Gatherings with Friends and Family: More than three times a week   Attends Religious Services: More than 4 times per year   Active Member of [employer] or Organizations: No   Attends Music therapist: Never   Marital Status: Married    Tobacco Counseling Counseling given: Not Answered Tobacco comments: minimal smoking history   Clinical Intake:  Pre-visit preparation completed: Yes  Pain : No/denies pain     BMI - recorded: 24.45 Nutritional Status: BMI of 19-24  Normal Nutritional Risks: None Diabetes: No  How often do you need to have someone help you when you read instructions, pamphlets, or other written materials from your doctor or pharmacy?: 1 - Never  Diabetic? No  Interpreter Needed?: No  Information entered by :: Orrin Brigham LPN   Activities of Daily Living In your present state of health, do you have any difficulty performing the following activities: 07/12/2021 08/08/2020  Hearing? N N  Vision? Y N  Difficulty concentrating or making decisions? N N  Walking or climbing stairs? N N  Dressing or bathing? N N  Doing errands, shopping? N N  Preparing Food and eating ? N -  Using the Toilet? N -  In the past six months, have you accidently leaked urine? N -  Do you have problems with loss of bowel control? N -  Managing your Medications? N -  Managing your Finances? N -  Housekeeping or managing your Housekeeping? N -  Some recent data might be hidden    Patient Care Team: Ria Bush, MD  as PCP - General (  Family Medicine) End, Harrell Gave, MD as PCP - Cardiology (Cardiology) Vickie Epley, MD as PCP - Electrophysiology (Cardiology) Jovita Gamma, MD as Consulting Physician (Neurosurgery) Neil Crouch, OD as Referring Physician (Optometry)  Indicate any recent Medical Services you may have received from other than [facility] providers in the past year (date may be approximate).     Assessment:   This is a routine wellness examination for [facility].  Hearing/Vision screen Hearing Screening - Comments:: No issues Vision Screening - Comments:: Last exam 02/2021, Dr. Renaldo Fiddler, wear glasses  Dietary issues and exercise activities discussed: Current Exercise Habits: The patient does not participate in regular exercise at present   Goals Addressed             This Visit's Progress    Patient Stated       Would like to drink more water       Depression Screen PHQ 2/9 Scores 07/12/2021 12/14/2020 03/25/2020 03/25/2019 03/06/2018 02/26/2017 02/21/2016  PHQ - 2 Score 0 0 0 0 0 0 0  PHQ- 9 Score - 1 0 1 0 1 -    Fall Risk Fall Risk  07/12/2021 03/25/2020 03/06/2018 02/26/2017 02/21/2016  Falls in the past year? 0 0 No No No  Number falls in past yr: 0 0 - - -  Injury with Fall? 0 0 - - -  Risk for fall due to : No Fall Risks Medication side effect - - -  Follow up Falls prevention discussed Falls evaluation completed;Falls prevention discussed - - -    FALL RISK PREVENTION PERTAINING TO THE HOME:  Any stairs in or around the home? Yes  If so, are there any without handrails? No  Home free of loose throw rugs in walkways, pet beds, electrical cords, etc? Yes  Adequate lighting in your home to reduce risk of falls? Yes   ASSISTIVE DEVICES UTILIZED TO PREVENT FALLS:  Life alert? No  Use of a cane, walker or w/c? No  Grab bars in the bathroom? Yes  Shower chair or bench in shower? Yes  Elevated toilet seat or a handicapped toilet? Yes   TIMED UP AND GO:  Was the test  performed? No , visit completed over the phone.    Cognitive Function: Normal cognitive status assessed by this Nurse Health Advisor. No abnormalities found.   MMSE - Mini Mental State Exam 03/25/2020 03/06/2018 02/26/2017 02/21/2016  Orientation to time 5 5 5 5   Orientation to Place 5 5 5 5   Registration 3 3 3 3   Attention/ Calculation 5 0 0 0  Recall 3 3 3 3   Language- name 2 objects - 0 0 0  Language- repeat 1 1 1 1   Language- follow 3 step command - 3 3 3   Language- read & follow direction - 0 0 0  Write a sentence - 0 0 0  Copy design - 0 0 0  Total score - 20 20 20         Immunizations Immunization History  Administered Date(s) Administered   Fluad Quad(high Dose 65+) 03/25/2019, 03/30/2020   Influenza, High Dose Seasonal PF 04/10/2018, 03/24/2021   Influenza,inj,Quad PF,6+ Mos 03/16/2015   Influenza-Unspecified 04/15/2013, 04/15/2014, 04/16/2016, 04/15/2017   PFIZER(Purple Top)SARS-COV-2 Vaccination 08/02/2019, 08/22/2019, 05/02/2020   Pfizer Covid-19 Vaccine Bivalent Booster 37yrs & up 04/27/2021   Pneumococcal Conjugate-13 08/28/2013   Pneumococcal Polysaccharide-23 08/27/2012   Tdap 08/27/2012   Zoster, Live 07/16/2010    TDAP status: Up to date  Flu Vaccine status: Up to date  Pneumococcal vaccine status: Up to date  Covid-19 vaccine status: Completed vaccines  Qualifies for Shingles Vaccine? Yes   Zostavax completed Yes   Shingrix Completed?: No.    Education has been provided regarding the importance of this vaccine. Patient has been advised to call insurance company to determine out of pocket expense if they have not yet received this vaccine. Advised may also receive vaccine at local pharmacy or Health Dept. Verbalized acceptance and understanding.  Screening Tests Health Maintenance  Topic Date Due   Zoster Vaccines- Shingrix (1 of 2) Never done   COLON CANCER SCREENING ANNUAL FOBT  04/01/2021   MAMMOGRAM  12/21/2021   TETANUS/TDAP  08/27/2022    COLONOSCOPY (Pts 45-92yrs Insurance coverage will need to be confirmed)  06/19/2023   Pneumonia Vaccine 55+ Years old  Completed   INFLUENZA VACCINE  Completed   DEXA SCAN  Completed   COVID-19 Vaccine  Completed   Hepatitis C Screening  Completed   HPV VACCINES  Aged Out    Health Maintenance  Health Maintenance Due  Topic Date Due   Zoster Vaccines- Shingrix (1 of 2) Never done   COLON CANCER SCREENING ANNUAL FOBT  04/01/2021    Colorectal cancer screening: Type of screening: Colonoscopy. Completed 06/21/18. Repeat every 5 years  Mammogram status: Completed 12/21/20. Repeat every year  Bone Density status: Ordered 07/12/21. Pt provided with contact info and advised to call to schedule appt.  Lung Cancer Screening: (Low Dose CT Chest recommended if Age 82-80 years, 30 pack-year currently smoking OR have quit w/in 15years.) does not qualify.     Additional Screening:  Hepatitis C Screening: does qualify; Completed 02/21/16  Vision Screening: Recommended annual ophthalmology exams for early detection of glaucoma and other disorders of the eye. Is the patient up to date with their annual eye exam?  Yes  Who is the provider or what is the name of the office in which the patient attends annual eye exams? Dr. Renaldo Fiddler   Dental Screening: Recommended annual dental exams for proper oral hygiene  Community Resource Referral / Chronic Care Management: CRR required this visit?  No   CCM required this visit?  No      Plan:     I have personally reviewed and noted the following in the patients chart:   Medical and social history Use of alcohol, tobacco or illicit drugs  Current medications and supplements including opioid prescriptions.  Functional ability and status Nutritional status Physical activity Advanced directives List of other physicians Hospitalizations, surgeries, and ER visits in previous 12 months Vitals Screenings to include cognitive, depression, and  falls Referrals and appointments  In addition, I have reviewed and discussed with patient certain preventive protocols, quality metrics, and best practice recommendations. A written personalized care plan for preventive services as well as general preventive health recommendations were provided to patient.   Due to this being a telephonic visit, the after visit summary with patients personalized plan was offered to patient via mail or my-chart. Patient preferred to pick up at office at next visit.   Loma Messing, LPN   [id number]   Nurse Health Advisor  Nurse Notes: none

## 2021-07-12 ENCOUNTER — Ambulatory Visit (INDEPENDENT_AMBULATORY_CARE_PROVIDER_SITE_OTHER): Payer: Medicare HMO

## 2021-07-12 VITALS — Ht 63.0 in | Wt 138.0 lb

## 2021-07-12 DIAGNOSIS — Z0000 Encounter for general adult medical examination without abnormal findings: Secondary | ICD-10-CM | POA: Diagnosis not present

## 2021-07-12 DIAGNOSIS — Z780 Asymptomatic menopausal state: Secondary | ICD-10-CM | POA: Diagnosis not present

## 2021-07-12 NOTE — Patient Instructions (Addendum)
Ann White , Thank you for taking time to complete your Medicare Wellness Visit. I appreciate your ongoing commitment to your health goals. Please review the following plan we discussed and let me know if I can assist you in the future.   Screening recommendations/referrals: Colonoscopy: up to date, completed 06/18/18, due 06/19/23 Mammogram: up to date, completed 12/21/20, 12/21/21 Bone Density: due, last completed 08/16/11, ordered today, someone will call to schedule an appointment Recommended yearly ophthalmology/optometry visit for glaucoma screening and checkup Recommended yearly dental visit for hygiene and checkup  Vaccinations: Influenza vaccine: up to date Pneumococcal vaccine: up to date Tdap vaccine: up to date, completed 08/27/12 Shingles vaccine:May obtain vaccine at your local pharmacy.  Covid-19:up to date  Advanced directives: Please bring a copy of Living Will and/or Healthcare Power of Attorney for your chart.   Conditions/risks identified: see problem list  Next appointment: Follow up in one year for your annual wellness visit 07/13/22 @ 10:30am, this will be a telephone visit   Preventive Care 65 Years and Older, Female Preventive care refers to lifestyle choices and visits with your health care provider that can promote health and wellness. What does preventive care include? A yearly physical exam. This is also called an annual well check. Dental exams once or twice a year. Routine eye exams. Ask your health care provider how often you should have your eyes checked. Personal lifestyle choices, including: Daily care of your teeth and gums. Regular physical activity. Eating a healthy diet. Avoiding tobacco and drug use. Limiting alcohol use. Practicing safe sex. Taking low-dose aspirin every day. Taking vitamin and mineral supplements as recommended by your health care provider. What happens during an annual well check? The services and screenings done by your  health care provider during your annual well check will depend on your age, overall health, lifestyle risk factors, and family history of disease. Counseling  Your health care provider may ask you questions about your: Alcohol use. Tobacco use. Drug use. Emotional well-being. Home and relationship well-being. Sexual activity. Eating habits. History of falls. Memory and ability to understand (cognition). Work and work Statistician. Reproductive health. Screening  You may have the following tests or measurements: Height, weight, and BMI. Blood pressure. Lipid and cholesterol levels. These may be checked every 5 years, or more frequently if you are over 74 years old. Skin check. Lung cancer screening. You may have this screening every year starting at age 74 if you have a 30-pack-year history of smoking and currently smoke or have quit within the past 15 years. Fecal occult blood test (FOBT) of the stool. You may have this test every year starting at age 74. Flexible sigmoidoscopy or colonoscopy. You may have a sigmoidoscopy every 5 years or a colonoscopy every 10 years starting at age 74. Hepatitis C blood test. Hepatitis B blood test. Sexually transmitted disease (STD) testing. Diabetes screening. This is done by checking your blood sugar (glucose) after you have not eaten for a while (fasting). You may have this done every 1-3 years. Bone density scan. This is done to screen for osteoporosis. You may have this done starting at age 74. Mammogram. This may be done every 1-2 years. Talk to your health care provider about how often you should have regular mammograms. Talk with your health care provider about your test results, treatment options, and if necessary, the need for more tests. Vaccines  Your health care provider may recommend certain vaccines, such as: Influenza vaccine. This is recommended every year.  Tetanus, diphtheria, and acellular pertussis (Tdap, Td) vaccine. You may  need a Td booster every 10 years. Zoster vaccine. You may need this after age 74. Pneumococcal 13-valent conjugate (PCV13) vaccine. One dose is recommended after age 74. Pneumococcal polysaccharide (PPSV23) vaccine. One dose is recommended after age 74. Talk to your health care provider about which screenings and vaccines you need and how often you need them. This information is not intended to replace advice given to you by your health care provider. Make sure you discuss any questions you have with your health care provider. Document Released: 07/29/2015 Document Revised: 03/21/2016 Document Reviewed: 05/03/2015 Elsevier Interactive Patient Education  2017 [place] Prevention in the Home Falls can cause injuries. They can happen to people of all ages. There are many things you can do to make your home safe and to help prevent falls. What can I do on the outside of my home? Regularly fix the edges of walkways and driveways and fix any cracks. Remove anything that might make you trip as you walk through a door, such as a raised step or threshold. Trim any bushes or trees on the path to your home. Use bright outdoor lighting. Clear any walking paths of anything that might make someone trip, such as rocks or tools. Regularly check to see if handrails are loose or broken. Make sure that both sides of any steps have handrails. Any raised decks and porches should have guardrails on the edges. Have any leaves, snow, or ice cleared regularly. Use sand or salt on walking paths during winter. Clean up any spills in your garage right away. This includes oil or grease spills. What can I do in the bathroom? Use night lights. Install grab bars by the toilet and in the tub and shower. Do not use towel bars as grab bars. Use non-skid mats or decals in the tub or shower. If you need to sit down in the shower, use a plastic, non-slip stool. Keep the floor dry. Clean up any water that spills on  the floor as soon as it happens. Remove soap buildup in the tub or shower regularly. Attach bath mats securely with double-sided non-slip rug tape. Do not have throw rugs and other things on the floor that can make you trip. What can I do in the bedroom? Use night lights. Make sure that you have a light by your bed that is easy to reach. Do not use any sheets or blankets that are too big for your bed. They should not hang down onto the floor. Have a firm chair that has side arms. You can use this for support while you get dressed. Do not have throw rugs and other things on the floor that can make you trip. What can I do in the kitchen? Clean up any spills right away. Avoid walking on wet floors. Keep items that you use a lot in easy-to-reach places. If you need to reach something above you, use a strong step stool that has a grab bar. Keep electrical cords out of the way. Do not use floor polish or wax that makes floors slippery. If you must use wax, use non-skid floor wax. Do not have throw rugs and other things on the floor that can make you trip. What can I do with my stairs? Do not leave any items on the stairs. Make sure that there are handrails on both sides of the stairs and use them. Fix handrails that are broken or loose.  Make sure that handrails are as long as the stairways. Check any carpeting to make sure that it is firmly attached to the stairs. Fix any carpet that is loose or worn. Avoid having throw rugs at the top or bottom of the stairs. If you do have throw rugs, attach them to the floor with carpet tape. Make sure that you have a light switch at the top of the stairs and the bottom of the stairs. If you do not have them, ask someone to add them for you. What else can I do to help prevent falls? Wear shoes that: Do not have high heels. Have rubber bottoms. Are comfortable and fit you well. Are closed at the toe. Do not wear sandals. If you use a stepladder: Make sure  that it is fully opened. Do not climb a closed stepladder. Make sure that both sides of the stepladder are locked into place. Ask someone to hold it for you, if possible. Clearly mark and make sure that you can see: Any grab bars or handrails. First and last steps. Where the edge of each step is. Use tools that help you move around (mobility aids) if they are needed. These include: Canes. Walkers. Scooters. Crutches. Turn on the lights when you go into a dark area. Replace any light bulbs as soon as they burn out. Set up your furniture so you have a clear path. Avoid moving your furniture around. If any of your floors are uneven, fix them. If there are any pets around you, be aware of where they are. Review your medicines with your doctor. Some medicines can make you feel dizzy. This can increase your chance of falling. Ask your doctor what other things that you can do to help prevent falls. This information is not intended to replace advice given to you by your health care provider. Make sure you discuss any questions you have with your health care provider. Document Released: 04/28/2009 Document Revised: 12/08/2015 Document Reviewed: 08/06/2014 Elsevier Interactive Patient Education  2017 [employer].

## 2021-07-19 ENCOUNTER — Ambulatory Visit (INDEPENDENT_AMBULATORY_CARE_PROVIDER_SITE_OTHER): Payer: Medicare Other | Admitting: Family Medicine

## 2021-07-19 ENCOUNTER — Encounter: Payer: Self-pay | Admitting: Family Medicine

## 2021-07-19 ENCOUNTER — Other Ambulatory Visit: Payer: Self-pay

## 2021-07-19 VITALS — BP 122/64 | HR 60 | Temp 98.2°F | Ht 63.0 in | Wt 138.0 lb

## 2021-07-19 DIAGNOSIS — Z0000 Encounter for general adult medical examination without abnormal findings: Secondary | ICD-10-CM | POA: Insufficient documentation

## 2021-07-19 DIAGNOSIS — G720 Drug-induced myopathy: Secondary | ICD-10-CM

## 2021-07-19 DIAGNOSIS — I5022 Chronic systolic (congestive) heart failure: Secondary | ICD-10-CM

## 2021-07-19 DIAGNOSIS — T466X5A Adverse effect of antihyperlipidemic and antiarteriosclerotic drugs, initial encounter: Secondary | ICD-10-CM

## 2021-07-19 DIAGNOSIS — Z0001 Encounter for general adult medical examination with abnormal findings: Secondary | ICD-10-CM

## 2021-07-19 DIAGNOSIS — D631 Anemia in chronic kidney disease: Secondary | ICD-10-CM

## 2021-07-19 DIAGNOSIS — I10 Essential (primary) hypertension: Secondary | ICD-10-CM

## 2021-07-19 DIAGNOSIS — R946 Abnormal results of thyroid function studies: Secondary | ICD-10-CM

## 2021-07-19 DIAGNOSIS — E7849 Other hyperlipidemia: Secondary | ICD-10-CM

## 2021-07-19 DIAGNOSIS — I77811 Abdominal aortic ectasia: Secondary | ICD-10-CM | POA: Diagnosis not present

## 2021-07-19 DIAGNOSIS — M5431 Sciatica, right side: Secondary | ICD-10-CM

## 2021-07-19 DIAGNOSIS — N1832 Chronic kidney disease, stage 3b: Secondary | ICD-10-CM

## 2021-07-19 DIAGNOSIS — I6523 Occlusion and stenosis of bilateral carotid arteries: Secondary | ICD-10-CM | POA: Diagnosis not present

## 2021-07-19 DIAGNOSIS — R7303 Prediabetes: Secondary | ICD-10-CM

## 2021-07-19 DIAGNOSIS — Z7189 Other specified counseling: Secondary | ICD-10-CM

## 2021-07-19 DIAGNOSIS — D6869 Other thrombophilia: Secondary | ICD-10-CM

## 2021-07-19 DIAGNOSIS — F331 Major depressive disorder, recurrent, moderate: Secondary | ICD-10-CM

## 2021-07-19 DIAGNOSIS — I428 Other cardiomyopathies: Secondary | ICD-10-CM | POA: Diagnosis not present

## 2021-07-19 DIAGNOSIS — I4819 Other persistent atrial fibrillation: Secondary | ICD-10-CM

## 2021-07-19 DIAGNOSIS — K219 Gastro-esophageal reflux disease without esophagitis: Secondary | ICD-10-CM | POA: Diagnosis not present

## 2021-07-19 DIAGNOSIS — F5104 Psychophysiologic insomnia: Secondary | ICD-10-CM

## 2021-07-19 DIAGNOSIS — R7989 Other specified abnormal findings of blood chemistry: Secondary | ICD-10-CM | POA: Diagnosis not present

## 2021-07-19 DIAGNOSIS — E663 Overweight: Secondary | ICD-10-CM

## 2021-07-19 NOTE — Assessment & Plan Note (Deleted)
Preventative protocols reviewed and updated unless pt declined. Discussed healthy diet and lifestyle.  

## 2021-07-19 NOTE — Progress Notes (Signed)
Patient ID: Ann White, female    DOB: 09/01/1945, 76 y.o.   MRN: [id number]  This visit was conducted in person.  BP 122/64 (BP Location: Left Arm, Patient Position: Sitting, Cuff Size: Normal)    Pulse 60    Temp 98.2 F (36.8 C) (Temporal)    Ht 5\' 3"  (1.6 m)    Wt 138 lb (62.6 kg)    SpO2 98%    BMI 24.45 kg/m    CC: CPE Subjective:   HPI: Ann White is a 76 y.o. female presenting on 07/19/2021 for Medicare Wellness (Part 2)   Saw health advisor last week for medicare wellness visit. Note reviewed.    No results found.  Flowsheet Row Clinical Support from 07/12/2021 in [place] [facility] [place]  PHQ-2 Total Score 0       Fall Risk  07/12/2021 03/25/2020 03/06/2018 02/26/2017 02/21/2016  Falls in the past year? 0 0 No No No  Number falls in past yr: 0 0 - - -  Injury with Fall? 0 0 - - -  Risk for fall due to : No Fall Risks Medication side effect - - -  Follow up Falls prevention discussed Falls evaluation completed;Falls prevention discussed - - -    Sees Dr Quentin Ore for afib now on eliquis and tikosyn. Also sees Dr End for non ischemic CM and lipid clinic for familial hyperlipidemia - stable period on losartan 25mg . Also has stable moderate BICA disease. She is also taking Repatha q2 weeks for cholesterol control. Sse is also on tikosyn and eliquis and crestor 5mg  three times a week.    Seen last month with UTI treated with keflex course. UCx returned growing E coli.   Healthy diet and lifestyle efforts have led to weight loss over 2 yrs (~50 lbs). Weight loss has stabilized - and she feels well.   She has been more active with husband's illness - notes occasional R sciatic pain.    Preventative: Colon cancer screening - colonoscopy 06/2013 mod diverticulosis, 3 tubular adenomas, int hem rpt 5 yrs [facility] Plan)  COLONOSCOPY 06/2018 - TA, colonic angiodysplastic lesion, mod diverticulosis, rpt 5 yrs [facility] Plan)  Well woman - with prior PCP Dr. Rocky Link. S/p  hysterectomy and bilat oophorectomy for benign reason 1990s.  [facility]  DEXA 2013 - WNL Lung cancer screening - not eligible  Flu shot yearly COVID vaccine - [place] 07/2019, 08/2019, booster 04/2020, bivalent 04/2021 Pneumovax 2014. TLXBWIO-03 08/2013 Tdap 2014 zostavax 2012.  Shingrix - discussed - will check with pharmacy as in 2023 insurance may now cover Advanced directives: Husband then daughter are HCPOA. Has packet at home but needs to get notarized. Asked to bring me copy.  Seat belt use discussed  Sunscreen use discussed. No changing moles on skin.  Non smokers  EtOH - none Dentist Q6 mo Eye exam yearly  Bowel - no constipation, occasional diarrhea Bladder - no incontinence    Lives with husband and 2 cats. 2 grown children, 5 grandchildren  Occupation: retired, worked in [employer]  Activity: regular walking Diet: fruits/vegetables daily, good water      Relevant past medical, surgical, family and social history reviewed and updated as indicated. Interim medical history since our last visit reviewed. Allergies and medications reviewed and updated. Outpatient Medications Prior to Visit  Medication Sig Dispense Refill   acetaminophen (TYLENOL) 500 MG tablet Take 500-1,000 mg by mouth every 6 (six) hours as needed (knee  pain.).     apixaban (ELIQUIS) 5 MG TABS tablet Take 5 mg by mouth 2 (two) times daily.     cetirizine (ZYRTEC) 10 MG tablet Take 10 mg by mouth daily.      Cholecalciferol (VITAMIN D3) 1000 UNITS CAPS Take 1 capsule (1,000 Units total) by mouth daily.     clonazePAM (KLONOPIN) 1 MG tablet Take 1 tablet by mouth twice daily as needed 60 tablet 0   docusate sodium (COLACE) 100 MG capsule Take 100-200 mg by mouth daily as needed (constipation.).     dofetilide (TIKOSYN) 125 MCG capsule TAKE 1 CAPSULE BY MOUTH TWO TIMES A DAY 180 capsule 3   Evolocumab (REPATHA SURECLICK) 867 MG/ML SOAJ Inject 1 pen into the skin every 14 (fourteen)  days. 6 mL 3   gabapentin (NEURONTIN) 300 MG capsule TAKE ONE CAPSULE EVERY MORNING AND 2 CAPSULES AT BEDTIME 270 capsule 2   losartan (COZAAR) 25 MG tablet Take 1 tablet by mouth once daily 90 tablet 1   Multiple Vitamin (MULTIVITAMIN WITH MINERALS) TABS tablet Take 1 tablet by mouth daily.     oxymetazoline (AFRIN) 0.05 % nasal spray Place 1 spray into both nostrils 2 (two) times daily as needed for congestion.     pantoprazole (PROTONIX) 40 MG tablet Take 1 tablet by mouth once daily 90 tablet 0   rosuvastatin (CRESTOR) 5 MG tablet Take 1 tablet (5 mg total) by mouth 3 (three) times a week. 36 tablet 3   sertraline (ZOLOFT) 100 MG tablet Take 1 tablet by mouth once daily 90 tablet 0   traMADol (ULTRAM) 50 MG tablet TAKE 1 TABLET BY MOUTH THREE TIMES DAILY AS NEEDED FOR  KNEE  PAIN 120 tablet 0   vitamin B-12 (CYANOCOBALAMIN) 500 MCG tablet Take 500 mcg by mouth daily.      No facility-administered medications prior to visit.     Per HPI unless specifically indicated in ROS section below Review of Systems  Constitutional:  Negative for activity change, appetite change, chills, fatigue, fever and unexpected weight change.  HENT:  Negative for hearing loss.   Eyes:  Negative for visual disturbance.  Respiratory:  Negative for cough, chest tightness, shortness of breath and wheezing.   Cardiovascular:  Negative for chest pain, palpitations and leg swelling.  Gastrointestinal:  Negative for abdominal distention, abdominal pain, blood in stool, constipation, diarrhea, nausea and vomiting.  Genitourinary:  Negative for difficulty urinating and hematuria.  Musculoskeletal:  Negative for arthralgias, myalgias and neck pain.  Skin:  Negative for rash.  Neurological:  Positive for headaches (occ). Negative for dizziness, seizures and syncope.  Hematological:  Negative for adenopathy. Does not bruise/bleed easily.  Psychiatric/Behavioral:  Negative for dysphoric mood. The patient is not  nervous/anxious.    Objective:  BP 122/64 (BP Location: Left Arm, Patient Position: Sitting, Cuff Size: Normal)    Pulse 60    Temp 98.2 F (36.8 C) (Temporal)    Ht 5\' 3"  (1.6 m)    Wt 138 lb (62.6 kg)    SpO2 98%    BMI 24.45 kg/m   Wt Readings from Last 3 Encounters:  07/19/21 138 lb (62.6 kg)  07/12/21 138 lb (62.6 kg)  06/16/21 142 lb 4 oz (64.5 kg)      Physical Exam Vitals and nursing note reviewed.  Constitutional:      Appearance: Normal appearance. She is not ill-appearing.  HENT:     Head: Normocephalic and atraumatic.     Right Ear: Tympanic  membrane, ear canal and external ear normal. There is no impacted cerumen.     Left Ear: Tympanic membrane, ear canal and external ear normal. There is no impacted cerumen.  Eyes:     General:        Right eye: No discharge.        Left eye: No discharge.     Extraocular Movements: Extraocular movements intact.     Conjunctiva/sclera: Conjunctivae normal.     Pupils: Pupils are equal, round, and reactive to light.  Neck:     Thyroid: No thyroid mass or thyromegaly.  Cardiovascular:     Rate and Rhythm: Normal rate and regular rhythm.     Pulses: Normal pulses.     Heart sounds: Normal heart sounds. No murmur heard. Pulmonary:     Effort: Pulmonary effort is normal. No respiratory distress.     Breath sounds: Normal breath sounds. No wheezing, rhonchi or rales.  Abdominal:     General: Bowel sounds are normal. There is no distension.     Palpations: Abdomen is soft. There is no mass.     Tenderness: There is no abdominal tenderness. There is no guarding or rebound.     Hernia: No hernia is present.     Comments: Prominent abd aorta with bruit  Musculoskeletal:        General: No tenderness. Normal range of motion.     Cervical back: Normal range of motion and neck supple. No rigidity.     Right lower leg: No edema.     Left lower leg: No edema.     Comments: Negative seated SLR on right, no pain with int/extrotation at  hip  Lymphadenopathy:     Cervical: No cervical adenopathy.  Skin:    General: Skin is warm and dry.     Findings: No rash.  Neurological:     General: No focal deficit present.     Mental Status: She is alert. Mental status is at baseline.  Psychiatric:        Mood and Affect: Mood normal.        Behavior: Behavior normal.      Results for orders placed or performed in visit on 07/19/21  T3  Result Value Ref Range   T3, Total 97 76 - 181 ng/dL   Lab Results  Component Value Date   HGBA1C 6.3 03/25/2020    Depression screen [facility] 2/9 07/12/2021 12/14/2020 03/25/2020 03/25/2019 03/06/2018  Decreased Interest 0 0 0 0 0  Down, Depressed, Hopeless 0 0 0 0 0  PHQ - 2 Score 0 0 0 0 0  Altered sleeping - 0 0 0 0  Tired, decreased energy - 1 0 1 0  Change in appetite - 0 0 0 0  Feeling bad or failure about yourself  - 0 0 0 0  Trouble concentrating - 0 0 0 0  Moving slowly or fidgety/restless - 0 0 0 0  Suicidal thoughts - 0 0 0 0  PHQ-9 Score - 1 0 1 0  Difficult doing work/chores - Not difficult at all Not difficult at all - Not difficult at all  Some recent data might be hidden    Assessment & Plan:  This visit occurred during the SARS-CoV-2 public health emergency.  Safety protocols were in place, including screening questions prior to the visit, additional usage of staff PPE, and extensive cleaning of exam room while observing appropriate contact time as indicated for disinfecting solutions.   Problem  List Items Addressed This Visit     Advanced care planning/counseling discussion (Chronic)    Advanced directives: Husband then daughter are HCPOA. Has packet at home but needs to get notarized. Asked to bring me copy.       Encounter for general adult medical examination with abnormal findings - Primary (Chronic)    Preventative protocols reviewed and updated unless pt declined. Discussed healthy diet and lifestyle.       Essential hypertension    Chronic, stable period on low  dose losartan.       GERD (gastroesophageal reflux disease)    Continues pantoprazole daily.       MDD (major depressive disorder), recurrent episode, moderate (HCC)    Stable period on sertraline 100mg  daily with klonopin for sleep.      Familial combined hyperlipidemia    Intolerant to most statins and zetia, seems to be tolerating crestor 5mg  MWF and Repatha biweekly through cardiology lipid clinic. Continue.  The 10-year ASCVD risk score (Arnett DK, et al., 2019) is: 16.5%   Values used to calculate the score:     Age: 4 years     Sex: Female     Is Non-Hispanic African American: No     Diabetic: No     Tobacco smoker: No     Systolic Blood Pressure: 161 mmHg     Is BP treated: Yes     HDL Cholesterol: 59 mg/dL     Total Cholesterol: 132 mg/dL       Prediabetes    Update A1c.       CKD (chronic kidney disease) stage 3, GFR 30-59 ml/min (HCC)    Update labs on low dose losartan.       Relevant Orders   CBC with Differential/Platelet   Renal function panel   VITAMIN D 25 Hydroxy (Vit-D Deficiency, Fractures)   Ectatic abdominal aorta (HCC)    Prominent abd aorta with bruit heard - will check AAA ultrasound.       Relevant Orders   VAS [place] AAA DUPLEX   Anemia due to stage 3 chronic kidney disease ([place])    Update labs.       Right sided sciatica    Seems recurrent, despite gabapentin use. Will continue to monitor.       Bilateral carotid artery stenosis    Update carotid US.       Relevant Orders   VAS US CAROTID   Nonischemic cardiomyopathy [facility])    Appreciate cardiology care.       Persistent atrial fibrillation (HCC)    Continue eliquis and tikosyn through EP       Chronic insomnia    Continue klonopin.       Statin myopathy   Chronic HFrEF (heart failure with reduced ejection fraction) (HCC)   Secondary hypercoagulable state ([place])   RESOLVED: Overweight (BMI 25.0-29.9)    Now normal weight, maintaining and feeling well.  Will resolve  problem.       Other Visit Diagnoses     Abnormal thyroid function test       Relevant Orders   TSH   T3 (Completed)   T4, free        No orders of the defined types were placed in this encounter.  Orders Placed This Encounter  Procedures   CBC with Differential/Platelet   TSH   T3   T4, free   Renal function panel   VITAMIN D 25 Hydroxy (Vit-D Deficiency, Fractures)  Patient instructions: Labs today  If interested, check with pharmacy about new 2 shot shingles series (shingrix).  Advanced directive packet provided today  We will check abdominal aorta ultrasound.  Good to see you today, return in 1 year for next wellness visit.   Follow up plan: Return in about 1 year (around 07/19/2022) for annual exam, prior fasting for blood work, medicare wellness visit.  Ria Bush, MD

## 2021-07-19 NOTE — Assessment & Plan Note (Addendum)
Advanced directives: Husband then daughter are HCPOA. Has packet at home but needs to get notarized. Asked to bring me copy.  

## 2021-07-19 NOTE — Patient Instructions (Addendum)
Labs today  If interested, check with pharmacy about new 2 shot shingles series (shingrix).  Advanced directive packet provided today  We will check abdominal aorta ultrasound.  Good to see you today, return in 1 year for next wellness visit.   Health Maintenance After Age 75 After age 62, you are at a higher risk for certain long-term diseases and infections as well as injuries from falls. Falls are a major cause of broken bones and head injuries in people who are older than age 54. Getting regular preventive care can help to keep you healthy and well. Preventive care includes getting regular testing and making lifestyle changes as recommended by your health care provider. Talk with your health care provider about: Which screenings and tests you should have. A screening is a test that checks for a disease when you have no symptoms. A diet and exercise plan that is right for you. What should I know about screenings and tests to prevent falls? Screening and testing are the best ways to find a health problem early. Early diagnosis and treatment give you the best chance of managing medical conditions that are common after age 90. Certain conditions and lifestyle choices may make you more likely to have a fall. Your health care provider may recommend: Regular vision checks. Poor vision and conditions such as cataracts can make you more likely to have a fall. If you wear glasses, make sure to get your prescription updated if your vision changes. Medicine review. Work with your health care provider to regularly review all of the medicines you are taking, including over-the-counter medicines. Ask your health care provider about any side effects that may make you more likely to have a fall. Tell your health care provider if any medicines that you take make you feel dizzy or sleepy. Strength and balance checks. Your health care provider may recommend certain tests to check your strength and balance while  standing, walking, or changing positions. Foot health exam. Foot pain and numbness, as well as not wearing proper footwear, can make you more likely to have a fall. Screenings, including: Osteoporosis screening. Osteoporosis is a condition that causes the bones to get weaker and break more easily. Blood pressure screening. Blood pressure changes and medicines to control blood pressure can make you feel dizzy. Depression screening. You may be more likely to have a fall if you have a fear of falling, feel depressed, or feel unable to do activities that you used to do. Alcohol use screening. Using too much alcohol can affect your balance and may make you more likely to have a fall. Follow these instructions at home: Lifestyle Do not drink alcohol if: Your health care provider tells you not to drink. If you drink alcohol: Limit how much you have to: 0-1 drink a day for women. 0-2 drinks a day for men. Know how much alcohol is in your drink. In the [place] one drink equals one 12 oz bottle of beer (355 mL), one 5 oz glass of wine (148 mL), or one 1 oz glass of hard liquor (44 mL). Do not use any products that contain nicotine or tobacco. These products include cigarettes, chewing tobacco, and vaping devices, such as e-cigarettes. If you need help quitting, ask your health care provider. Activity  Follow a regular exercise program to stay fit. This will help you maintain your balance. Ask your health care provider what types of exercise are appropriate for you. If you need a cane or walker, use it  as recommended by your health care provider. Wear supportive shoes that have nonskid soles. Safety  Remove any tripping hazards, such as rugs, cords, and clutter. Install safety equipment such as grab bars in bathrooms and safety rails on stairs. Keep rooms and walkways well-lit. General instructions Talk with your health care provider about your risks for falling. Tell your health care provider  if: You fall. Be sure to tell your health care provider about all falls, even ones that seem minor. You feel dizzy, tiredness (fatigue), or off-balance. Take over-the-counter and prescription medicines only as told by your health care provider. These include supplements. Eat a healthy diet and maintain a healthy weight. A healthy diet includes low-fat dairy products, low-fat (lean) meats, and fiber from whole grains, beans, and lots of fruits and vegetables. Stay current with your vaccines. Schedule regular health, dental, and eye exams. Summary Having a healthy lifestyle and getting preventive care can help to protect your health and wellness after age 28. Screening and testing are the best way to find a health problem early and help you avoid having a fall. Early diagnosis and treatment give you the best chance for managing medical conditions that are more common for people who are older than age 54. Falls are a major cause of broken bones and head injuries in people who are older than age 68. Take precautions to prevent a fall at home. Work with your health care provider to learn what changes you can make to improve your health and wellness and to prevent falls. This information is not intended to replace advice given to you by your health care provider. Make sure you discuss any questions you have with your health care provider. Document Revised: 11/21/2020 Document Reviewed: 11/21/2020 Elsevier Patient Education  [place].

## 2021-07-20 ENCOUNTER — Other Ambulatory Visit (INDEPENDENT_AMBULATORY_CARE_PROVIDER_SITE_OTHER): Payer: Medicare Other

## 2021-07-20 ENCOUNTER — Encounter: Payer: Self-pay | Admitting: Family Medicine

## 2021-07-20 DIAGNOSIS — R7303 Prediabetes: Secondary | ICD-10-CM | POA: Diagnosis not present

## 2021-07-20 DIAGNOSIS — I6523 Occlusion and stenosis of bilateral carotid arteries: Secondary | ICD-10-CM

## 2021-07-20 LAB — CBC WITH DIFFERENTIAL/PLATELET
Basophils Absolute: 0 K/uL (ref 0.0–0.1)
Basophils Relative: 0.5 % (ref 0.0–3.0)
Eosinophils Absolute: 0.1 K/uL (ref 0.0–0.7)
Eosinophils Relative: 3 % (ref 0.0–5.0)
HCT: 30.8 % — ABNORMAL LOW (ref 36.0–46.0)
Hemoglobin: 10.4 g/dL — ABNORMAL LOW (ref 12.0–15.0)
Lymphocytes Relative: 29.6 % (ref 12.0–46.0)
Lymphs Abs: 1.4 K/uL (ref 0.7–4.0)
MCHC: 33.7 g/dL (ref 30.0–36.0)
MCV: 89.2 fl (ref 78.0–100.0)
Monocytes Absolute: 0.2 K/uL (ref 0.1–1.0)
Monocytes Relative: 4.1 % (ref 3.0–12.0)
Neutro Abs: 2.9 K/uL (ref 1.4–7.7)
Neutrophils Relative %: 62.8 % (ref 43.0–77.0)
Platelets: 187 K/uL (ref 150.0–400.0)
RBC: 3.45 Mil/uL — ABNORMAL LOW (ref 3.87–5.11)
RDW: 13.2 % (ref 11.5–15.5)
WBC: 4.6 K/uL (ref 4.0–10.5)

## 2021-07-20 LAB — TSH: TSH: 3.93 u[IU]/mL (ref 0.35–5.50)

## 2021-07-20 LAB — T4, FREE: Free T4: 1.06 ng/dL (ref 0.60–1.60)

## 2021-07-20 LAB — VITAMIN D 25 HYDROXY (VIT D DEFICIENCY, FRACTURES): VITD: 57.85 ng/mL (ref 30.00–100.00)

## 2021-07-20 LAB — RENAL FUNCTION PANEL
Albumin: 4.6 g/dL (ref 3.5–5.2)
BUN: 21 mg/dL (ref 6–23)
CO2: 29 mEq/L (ref 19–32)
Calcium: 9.4 mg/dL (ref 8.4–10.5)
Chloride: 105 mEq/L (ref 96–112)
Creatinine, Ser: 1.2 mg/dL (ref 0.40–1.20)
GFR: 44.68 mL/min — ABNORMAL LOW (ref >60.00)
Glucose, Bld: 82 mg/dL (ref 70–99)
Phosphorus: 3.5 mg/dL (ref 2.3–4.6)
Potassium: 4.4 mEq/L (ref 3.5–5.1)
Sodium: 140 mEq/L (ref 135–145)

## 2021-07-20 LAB — HEMOGLOBIN A1C: Hgb A1c MFr Bld: 6 % (ref 4.6–6.5)

## 2021-07-20 LAB — T3: T3, Total: 97 ng/dL (ref 76–181)

## 2021-07-20 NOTE — Assessment & Plan Note (Signed)
Continue eliquis and tikosyn through EP

## 2021-07-20 NOTE — Assessment & Plan Note (Signed)
Prominent abd aorta with bruit heard - will check AAA ultrasound.

## 2021-07-20 NOTE — Assessment & Plan Note (Signed)
Update carotid US.  

## 2021-07-20 NOTE — Assessment & Plan Note (Signed)
Intolerant to most statins and zetia, seems to be tolerating crestor 5mg  MWF and Repatha biweekly through cardiology lipid clinic. Continue.  The 10-year ASCVD risk score (Arnett DK, et al., 2019) is: 16.5%   Values used to calculate the score:     Age: 76 years     Sex: Female     Is Non-Hispanic African American: No     Diabetic: No     Tobacco smoker: No     Systolic Blood Pressure: 090 mmHg     Is BP treated: Yes     HDL Cholesterol: 59 mg/dL     Total Cholesterol: 132 mg/dL

## 2021-07-20 NOTE — Assessment & Plan Note (Signed)
Update labs.  

## 2021-07-20 NOTE — Assessment & Plan Note (Signed)
Appreciate cardiology care.  °

## 2021-07-20 NOTE — Assessment & Plan Note (Signed)
Seems recurrent, despite gabapentin use. Will continue to monitor.

## 2021-07-20 NOTE — Assessment & Plan Note (Addendum)
Now normal weight, maintaining and feeling well.  Will resolve problem.

## 2021-07-20 NOTE — Assessment & Plan Note (Signed)
Continues pantoprazole daily.

## 2021-07-20 NOTE — Assessment & Plan Note (Addendum)
Update A1c ?

## 2021-07-20 NOTE — Assessment & Plan Note (Signed)
Stable period on sertraline 100mg  daily with klonopin for sleep.

## 2021-07-20 NOTE — Assessment & Plan Note (Signed)
Continue klonopin 

## 2021-07-20 NOTE — Assessment & Plan Note (Signed)
Preventative protocols reviewed and updated unless pt declined. Discussed healthy diet and lifestyle.  

## 2021-07-20 NOTE — Assessment & Plan Note (Signed)
Chronic, stable period on low dose losartan.

## 2021-07-20 NOTE — Assessment & Plan Note (Signed)
Update labs on low dose losartan.

## 2021-07-23 NOTE — Progress Notes (Signed)
Cardiology Office Note Date:  07/23/2021  Patient ID:  Ann White 1946/07/09, MRN [id number] PCP:  Ria Bush, MD  Cardiologist:  Dr. Saunders Revel Electrophysiologist: Dr. Quentin Ore    Chief Complaint:  planned f/u  History of Present Illness: Ann White is a 76 y.o. female with history of NICM (with improved LVEF), AFib, HTN, HLD, DVT, CKD (III), non-obstructive carotid disease.  She comes in today to be seen for Dr. Quentin Ore, last seen by him Oct 2022, doing well, maintaining SR, stable QTc, planned for 69mo EP APP visit for f/u.  TODAY She is doing very well No cardiac awareness of any kind.She does not exercise formally but is very active Her husband right now with significant hiop issues and she is helping hi, Manages/cares for the house, cleaning, laundry, shopping and so on No exertional intolerances For years she has had rare/ramdom monetary dizzy spell, no change or escalation in the behavior No near syncope or syncope.  AFib hx Diagnosed Sept 2021 Tikosyn started Jan 2022, is current  Past Medical History:  Diagnosis Date   Allergy    Anxiety    Arthritis    Carotid stenosis    a. 02/2016 Carotid U/S: Bilateral 40-59%; b. 03/2017 Carotid U/S: RICA 62-95%, LICA 2-84%; c. 13/2440 Carotid U/S: bilat 40-59% ICA dzs.   Cervical spondylosis    s/p spine injections   CKD (chronic kidney disease), stage III (HCC)    Depression    Ectatic abdominal aorta ([place]) 2016   rpt [place] 5 yrs   GERD (gastroesophageal reflux disease)    History of chicken pox    History of diverticulitis of colon    HLD (hyperlipidemia)    HTN (hypertension)    Irritable bowel syndrome with constipation    Lichen sclerosus et atrophicus    NICM (nonischemic cardiomyopathy) ([place])    a. 04/2017 Echo: EF 35-40%, diff HK; b. 05/2017 MV: EF 56%, no ischemia/infarct; c. 04/2018 Echo: EF 55-60%, no rwma, Gr2 DD; d. 03/2020 Echo: EF 40-45%, nl PASP, mod dil LA, mild to mod dil RA, Triv MR.    Obesity    Persistent atrial fibrillation ([place])    a. Dx 03/2020. CHA2DS2VASc = 5-->xarelto 15mg  daily.   Premature atrial contraction    a. 04/2017 48hr Holter: Predominant rhythm - sinus. Rare PVC's, freq PAC's with freq atrial runs up to 34 beats-->metoprolol started.   Seasonal allergic rhinitis     Past Surgical History:  Procedure Laterality Date   bladder tack  1990s   BREAST BIOPSY Right 03/05/2012    benign   CARDIOVERSION N/A 05/17/2020   Procedure: CARDIOVERSION;  Surgeon: Nelva Bush, MD;  Location: [facility] ORS;  Service: Cardiovascular;  Laterality: N/A;   CARPAL TUNNEL RELEASE  2004, 2013   bilateral   CHOLECYSTECTOMY  1990s   COLONOSCOPY  2002   COLONOSCOPY  06/2013   mod diverticulosis, 3 tubular adenomas, int hem rpt 5 yrs [facility] Plan)   COLONOSCOPY  06/2018   TA,c olonic angiodysplastic lesion, mod diverticulosis, rpt 5 yrs [facility] Plan)   DEXA  07/2011   normal, T score -0.3   ESOPHAGOGASTRODUODENOSCOPY ENDOSCOPY  2004   normal ([place])   TOTAL ABDOMINAL HYSTERECTOMY W/ BILATERAL SALPINGOOPHORECTOMY  1990s   complete, dysmenorrhea and fibroids    Current Outpatient Medications  Medication Sig Dispense Refill   acetaminophen (TYLENOL) 500 MG tablet Take 500-1,000 mg by mouth every 6 (six) hours as needed (knee pain.).     apixaban (ELIQUIS) 5 MG TABS  tablet Take 5 mg by mouth 2 (two) times daily.     cetirizine (ZYRTEC) 10 MG tablet Take 10 mg by mouth daily.      Cholecalciferol (VITAMIN D3) 1000 UNITS CAPS Take 1 capsule (1,000 Units total) by mouth daily.     clonazePAM (KLONOPIN) 1 MG tablet Take 1 tablet by mouth twice daily as needed 60 tablet 0   docusate sodium (COLACE) 100 MG capsule Take 100-200 mg by mouth daily as needed (constipation.).     dofetilide (TIKOSYN) 125 MCG capsule TAKE 1 CAPSULE BY MOUTH TWO TIMES A DAY 180 capsule 3   Evolocumab (REPATHA SURECLICK) 096 MG/ML SOAJ Inject 1 pen into the skin every 14 (fourteen) days. 6 mL 3   gabapentin  (NEURONTIN) 300 MG capsule TAKE ONE CAPSULE EVERY MORNING AND 2 CAPSULES AT BEDTIME 270 capsule 2   losartan (COZAAR) 25 MG tablet Take 1 tablet by mouth once daily 90 tablet 1   Multiple Vitamin (MULTIVITAMIN WITH MINERALS) TABS tablet Take 1 tablet by mouth daily.     oxymetazoline (AFRIN) 0.05 % nasal spray Place 1 spray into both nostrils 2 (two) times daily as needed for congestion.     pantoprazole (PROTONIX) 40 MG tablet Take 1 tablet by mouth once daily 90 tablet 0   rosuvastatin (CRESTOR) 5 MG tablet Take 1 tablet (5 mg total) by mouth 3 (three) times a week. 36 tablet 3   sertraline (ZOLOFT) 100 MG tablet Take 1 tablet by mouth once daily 90 tablet 0   traMADol (ULTRAM) 50 MG tablet TAKE 1 TABLET BY MOUTH THREE TIMES DAILY AS NEEDED FOR  KNEE  PAIN 120 tablet 0   vitamin B-12 (CYANOCOBALAMIN) 500 MCG tablet Take 500 mcg by mouth daily.      No current facility-administered medications for this visit.    Allergies:   Sulfa antibiotics, Nexletol [bempedoic acid], Tricor [fenofibrate], Zetia [ezetimibe], Contrast media [iodinated contrast media], Lipitor [atorvastatin], and Pravastatin   Social History:  The patient  reports that she quit smoking about 33 years ago. Her smoking use included cigarettes. She has a 0.75 pack-year smoking history. She has never used smokeless tobacco. She reports that she does not drink alcohol and does not use drugs.   Family History:  The patient's family history includes CAD (age of onset: 35) in her father; CAD (age of onset: 81) in her mother; Diabetes in her father; Hyperlipidemia in her sister; Hypertension in her mother; Parkinson's disease in her father.  ROS:  Please see the history of present illness.    All other systems are reviewed and otherwise negative.   PHYSICAL EXAM:  VS:  There were no vitals taken for this visit. BMI: There is no height or weight on file to calculate BMI. Well nourished, well developed, in no acute distress HEENT:  normocephalic, atraumatic Neck: no JVD, carotid bruits or masses Cardiac:   RRR; bradycardic, no significant murmurs, no rubs, or gallops Lungs:  CTA b/l, no wheezing, rhonchi or rales Abd: soft, nontender MS: no deformity, age appropriate atrophy Ext: no edema Skin: warm and dry, no rash Neuro:  No gross deficits appreciated Psych: euthymic mood, full affect   EKG:  Done today and reviewed by myself shows  SB 48bpm, Qtc 445ms  09/20/2020: TTE IMPRESSIONS   1. Left ventricular ejection fraction, by estimation, is 60 to 65%. Left  ventricular ejection fraction by 3D volume is 61 %. The left ventricle has  normal function. The left ventricle has no  regional wall motion  abnormalities. Left ventricular diastolic   parameters are consistent with Grade I diastolic dysfunction (impaired  relaxation).   2. Right ventricular systolic function is normal. The right ventricular  size is normal. There is normal pulmonary artery systolic pressure. The  estimated right ventricular systolic pressure is 79.3 mmHg.   3. Left atrial size was severely dilated.   4. Right atrial size was mild to moderately dilated.   5. The mitral valve is grossly normal. Trivial mitral valve  regurgitation. No evidence of mitral stenosis.   6. The aortic valve is tricuspid. Aortic valve regurgitation is not  visualized. No aortic stenosis is present.   7. The inferior vena cava is normal in size with greater than 50%  respiratory variability, suggesting right atrial pressure of 3 mmHg.   Comparison(s): Changes from prior study are noted. EF has improved to  60-65%. NSR has replaced Afib.   Recent Labs: 03/24/2021: Magnesium 2.3 06/23/2021: ALT 13 07/19/2021: BUN 21; Creatinine, Ser 1.20; Hemoglobin 10.4; Platelets 187.0; Potassium 4.4; Sodium 140; TSH 3.93  06/23/2021: Cholesterol 132; HDL 59; LDL Cholesterol 52; Total CHOL/HDL Ratio 2.2; Triglycerides 103; VLDL 21   Estimated Creatinine Clearance: 34 mL/min (by C-G  formula based on SCr of 1.2 mg/dL).   Wt Readings from Last 3 Encounters:  07/19/21 138 lb (62.6 kg)  07/12/21 138 lb (62.6 kg)  06/16/21 142 lb 4 oz (64.5 kg)     Other studies reviewed: Additional studies/records reviewed today include: summarized above  ASSESSMENT AND PLAN:  Persistent Afib CHA2DS2Vasc is 7, on Eliquis, appropriately dosed Tikosyn, stable QTc No symptoms of AFib (though she was largely unaware of it when she had it) Mag level today  NICM LVEF this year 60-65% No symptoms or exam findigs of volume OL No BB with bradycardia  HTN No changes today  4. Asymptomatic bradycardia Discussed symptoms to make [place] aware of or get attention for   Disposition: F/u with Dr. Saunders Revel as scheduled, EP in 27mo, sooner if needed  Current medicines are reviewed at length with the patient today.  The patient did not have any concerns regarding medicines.  Venetia Night, PA-C 07/23/2021 12:42 PM     [address] [place] [address] [phone] (office)  [phone] (fax)

## 2021-07-24 ENCOUNTER — Other Ambulatory Visit: Payer: Self-pay | Admitting: Family Medicine

## 2021-07-26 ENCOUNTER — Other Ambulatory Visit: Payer: Self-pay

## 2021-07-26 ENCOUNTER — Encounter: Payer: Self-pay | Admitting: Physician Assistant

## 2021-07-26 ENCOUNTER — Ambulatory Visit (INDEPENDENT_AMBULATORY_CARE_PROVIDER_SITE_OTHER): Payer: Medicare Other | Admitting: Physician Assistant

## 2021-07-26 VITALS — BP 142/70 | HR 48 | Ht 63.0 in | Wt 139.8 lb

## 2021-07-26 DIAGNOSIS — Z79899 Other long term (current) drug therapy: Secondary | ICD-10-CM

## 2021-07-26 DIAGNOSIS — Z5181 Encounter for therapeutic drug level monitoring: Secondary | ICD-10-CM

## 2021-07-26 DIAGNOSIS — R001 Bradycardia, unspecified: Secondary | ICD-10-CM

## 2021-07-26 DIAGNOSIS — I428 Other cardiomyopathies: Secondary | ICD-10-CM

## 2021-07-26 DIAGNOSIS — I4819 Other persistent atrial fibrillation: Secondary | ICD-10-CM | POA: Diagnosis not present

## 2021-07-26 LAB — MAGNESIUM: Magnesium: 2.3 mg/dL (ref 1.6–2.3)

## 2021-07-26 NOTE — Patient Instructions (Signed)
Medication Instructions:   Your physician recommends that you continue on your current medications as directed. Please refer to the Current Medication list given to you today.  *If you need a refill on your cardiac medications before your next appointment, please call your pharmacy*   Lab Work:  [place]   If you have labs (blood work) drawn today and your tests are completely normal, you will receive your results only by: [place] (if you have MyChart) OR A paper copy in the mail If you have any lab test that is abnormal or we need to change your treatment, we will call you to review the results.   Testing/Procedures:  NONE ORDERED  TODAY     Follow-Up: At [facility], you and your health needs are our priority.  As part of our continuing mission to provide you with exceptional heart care, we have created designated Provider Care Teams.  These Care Teams include your primary Cardiologist (physician) and Advanced Practice Providers (APPs -  Physician Assistants and Nurse Practitioners) who all work together to provide you with the care you need, when you need it.  We recommend signing up for the patient portal called "MyChart".  Sign up information is provided on this After Visit Summary.  MyChart is used to connect with patients for Virtual Visits (Telemedicine).  Patients are able to view lab/test results, encounter notes, upcoming appointments, etc.  Non-urgent messages can be sent to your provider as well.   To learn more about what you can do with MyChart, go to [email or web address].    Your next appointment:   6 month(s)  The format for your next appointment:   In Person  Provider:   You may see Vickie Epley, MD or one of the following Advanced Practice Providers on your designated Care Team:   Tommye Standard, [place] Legrand [place] "Jonni Sanger" Chalmers Cater, PA-C{   Other Instructions;

## 2021-08-15 ENCOUNTER — Other Ambulatory Visit: Payer: Self-pay

## 2021-08-15 ENCOUNTER — Ambulatory Visit (HOSPITAL_COMMUNITY)
Admission: RE | Admit: 2021-08-15 | Discharge: 2021-08-15 | Disposition: A | Discharge status: Home / Self Care | Payer: Medicare Other | Source: Ambulatory Visit | Attending: Cardiovascular Disease | Admitting: Cardiovascular Disease

## 2021-08-15 ENCOUNTER — Telehealth: Payer: Self-pay | Admitting: Family Medicine

## 2021-08-15 ENCOUNTER — Telehealth: Payer: Self-pay | Admitting: *Deleted

## 2021-08-15 ENCOUNTER — Ambulatory Visit (HOSPITAL_BASED_OUTPATIENT_CLINIC_OR_DEPARTMENT_OTHER)
Admission: RE | Admit: 2021-08-15 | Discharge: 2021-08-15 | Disposition: A | Discharge status: Home / Self Care | Payer: Medicare Other | Source: Ambulatory Visit | Attending: Cardiovascular Disease | Admitting: Cardiovascular Disease

## 2021-08-15 DIAGNOSIS — I714 Abdominal aortic aneurysm, without rupture, unspecified: Secondary | ICD-10-CM

## 2021-08-15 DIAGNOSIS — I77811 Abdominal aortic ectasia: Secondary | ICD-10-CM | POA: Diagnosis not present

## 2021-08-15 DIAGNOSIS — I6523 Occlusion and stenosis of bilateral carotid arteries: Secondary | ICD-10-CM | POA: Insufficient documentation

## 2021-08-15 NOTE — Telephone Encounter (Signed)
Pt called asking if it would be ok for [place] the beach [place] with [place] aneurysm. Pt states that you can call her at [phone]. Please advise.

## 2021-08-15 NOTE — Telephone Encounter (Signed)
Shela Leff at [facility] called stating that patient is there today. Shela Leff stated that she is doing a call report. Shela Leff stated that they found a 5.1 cm AAA on test today. Shela Leff stated that the patient is still there. Shela Leff stated that she is calling to give the results to PCP.

## 2021-08-15 NOTE — Telephone Encounter (Signed)
Would prefer she see the vascular surgeon first. We are working on a referral.

## 2021-08-15 NOTE — Telephone Encounter (Signed)
I have been working with [facility] today to get this patient in. They stated that they can see her this week and will call her directly to schedule.   FYI

## 2021-08-15 NOTE — Telephone Encounter (Signed)
Recommend urgent [facility] evaluation referral placed.  Needs to ensure good BP control and if abd pain develops, needs to go to ER.  Spoke with patient regarding above.

## 2021-08-15 NOTE — Telephone Encounter (Signed)
Dr. Danise Mina aware and is calling [facility].

## 2021-08-15 NOTE — Telephone Encounter (Signed)
Patient notified as instructed by telephone and verbalized understanding. Patient was advised that one of the referral coordinators will be in touch with her.

## 2021-08-16 ENCOUNTER — Other Ambulatory Visit: Payer: Self-pay

## 2021-08-16 ENCOUNTER — Ambulatory Visit (INDEPENDENT_AMBULATORY_CARE_PROVIDER_SITE_OTHER): Payer: Medicare Other | Admitting: Vascular Surgery

## 2021-08-16 ENCOUNTER — Encounter: Payer: Self-pay | Admitting: Vascular Surgery

## 2021-08-16 VITALS — BP 145/78 | HR 53 | Temp 98.3°F | Resp 20 | Ht 63.0 in | Wt 140.0 lb

## 2021-08-16 DIAGNOSIS — I714 Abdominal aortic aneurysm, without rupture, unspecified: Secondary | ICD-10-CM

## 2021-08-16 MED ORDER — DIPHENHYDRAMINE HCL 50 MG PO CAPS: ORAL_CAPSULE | ORAL | 0 refills | Status: DC

## 2021-08-16 MED ORDER — PREDNISONE 50 MG PO TABS: ORAL_TABLET | ORAL | 0 refills | Status: DC

## 2021-08-16 NOTE — Progress Notes (Signed)
Patient ID: Ann White, female   DOB: September 22, 1945, 76 y.o.   MRN: [id number]  Reason for Consult: No chief complaint on file.   Referred by Ria Bush, MD  Subjective:     HPI:  Ann White is a 76 y.o. female history of known carotid stenosis as well as persistent atrial fibrillation for which she takes Eliquis.  She did not have any known history of aneurysm until recent screening.  No new back or abdominal pain.  No family history of aneurysm disease.  No previous vascular history she is a former smoker.  Past Medical History:  Diagnosis Date   Allergy    Anxiety    Arthritis    Carotid stenosis    a. 02/2016 Carotid U/S: Bilateral 40-59%; b. 03/2017 Carotid U/S: RICA 31-51%, LICA 7-61%; c. 60/7371 Carotid U/S: bilat 40-59% ICA dzs.   Cervical spondylosis    s/p spine injections   CKD (chronic kidney disease), stage III (HCC)    Depression    Ectatic abdominal aorta ([place]) 2016   rpt [place] 5 yrs   GERD (gastroesophageal reflux disease)    History of chicken pox    History of diverticulitis of colon    HLD (hyperlipidemia)    HTN (hypertension)    Irritable bowel syndrome with constipation    Lichen sclerosus et atrophicus    NICM (nonischemic cardiomyopathy) ([place])    a. 04/2017 Echo: EF 35-40%, diff HK; b. 05/2017 MV: EF 56%, no ischemia/infarct; c. 04/2018 Echo: EF 55-60%, no rwma, Gr2 DD; d. 03/2020 Echo: EF 40-45%, nl PASP, mod dil LA, mild to mod dil RA, Triv MR.   Obesity    Persistent atrial fibrillation ([place])    a. Dx 03/2020. CHA2DS2VASc = 5-->xarelto 15mg  daily.   Premature atrial contraction    a. 04/2017 48hr Holter: Predominant rhythm - sinus. Rare PVC's, freq PAC's with freq atrial runs up to 34 beats-->metoprolol started.   Seasonal allergic rhinitis    Family History  Problem Relation Age of Onset   CAD Mother 14       MI   Hypertension Mother    Parkinson's disease Father    Diabetes Father    CAD Father 74       MI   Hyperlipidemia  Sister    Cancer Neg Hx    Stroke Neg Hx    Colon cancer Neg Hx    Breast cancer Neg Hx    Colon polyps Neg Hx    Esophageal cancer Neg Hx    Stomach cancer Neg Hx    Rectal cancer Neg Hx    Past Surgical History:  Procedure Laterality Date   bladder tack  1990s   BREAST BIOPSY Right 03/05/2012    benign   CARDIOVERSION N/A 05/17/2020   Procedure: CARDIOVERSION;  Surgeon: Nelva Bush, MD;  Location: [facility];  Service: Cardiovascular;  Laterality: N/A;   CARPAL TUNNEL RELEASE  2004, 2013   bilateral   CHOLECYSTECTOMY  1990s   COLONOSCOPY  2002   COLONOSCOPY  06/2013   mod diverticulosis, 3 tubular adenomas, int hem rpt 5 yrs [facility] Plan)   COLONOSCOPY  06/2018   TA,c olonic angiodysplastic lesion, mod diverticulosis, rpt 5 yrs [facility] Plan)   DEXA  07/2011   normal, T score -0.3   ESOPHAGOGASTRODUODENOSCOPY ENDOSCOPY  2004   normal ([facility])   TOTAL ABDOMINAL HYSTERECTOMY W/ BILATERAL SALPINGOOPHORECTOMY  1990s   complete, dysmenorrhea and fibroids    Short Social History:  Social History  Tobacco Use   Smoking status: Former    Packs/day: 0.25    Years: 3.00    Pack years: 0.75    Types: Cigarettes    Quit date: 1990    Years since quitting: 33.1   Smokeless tobacco: Never   Tobacco comments:    minimal smoking history  Substance Use Topics   Alcohol use: No    Alcohol/week: 0.0 standard drinks    Allergies  Allergen Reactions   Sulfa Antibiotics Swelling    Oral swelling   Nexletol [Bempedoic Acid]     Dizziness, "eyes felt funny"   Tricor [Fenofibrate] Other (See Comments)    myalgias   Zetia [Ezetimibe] Other (See Comments)    Dizziness   Contrast Media [Iodinated Contrast Media] Other (See Comments)    Oral contrast caused mouth blisters   Lipitor [Atorvastatin] Other (See Comments)    myalgias   Pravastatin Other (See Comments)    myalgias    Current Outpatient Medications  Medication Sig Dispense Refill   acetaminophen (TYLENOL) 500 MG tablet Take  500-1,000 mg by mouth every 6 (six) hours as needed (knee pain.).     apixaban (ELIQUIS) 5 MG TABS tablet Take 5 mg by mouth 2 (two) times daily.     cetirizine (ZYRTEC) 10 MG tablet Take 10 mg by mouth daily.      Cholecalciferol (VITAMIN D3) 1000 UNITS CAPS Take 1 capsule (1,000 Units total) by mouth daily.     clonazePAM (KLONOPIN) 1 MG tablet Take 1 tablet by mouth twice daily as needed 60 tablet 0   docusate sodium (COLACE) 100 MG capsule Take 100-200 mg by mouth daily as needed (constipation.).     dofetilide (TIKOSYN) 125 MCG capsule TAKE 1 CAPSULE BY MOUTH TWO TIMES A DAY 180 capsule 3   Evolocumab (REPATHA SURECLICK) 101 MG/ML SOAJ Inject 1 pen into the skin every 14 (fourteen) days. 6 mL 3   gabapentin (NEURONTIN) 300 MG capsule TAKE ONE CAPSULE EVERY MORNING AND 2 CAPSULES AT BEDTIME 270 capsule 2   losartan (COZAAR) 25 MG tablet Take 1 tablet by mouth once daily 90 tablet 1   Multiple Vitamin (MULTIVITAMIN WITH MINERALS) TABS tablet Take 1 tablet by mouth daily.     oxymetazoline (AFRIN) 0.05 % nasal spray Place 1 spray into both nostrils 2 (two) times daily as needed for congestion.     pantoprazole (PROTONIX) 40 MG tablet Take 1 tablet by mouth once daily 90 tablet 0   rosuvastatin (CRESTOR) 5 MG tablet Take 1 tablet (5 mg total) by mouth 3 (three) times a week. 36 tablet 3   sertraline (ZOLOFT) 100 MG tablet Take 1 tablet by mouth once daily 90 tablet 0   traMADol (ULTRAM) 50 MG tablet TAKE 1 TABLET BY MOUTH THREE TIMES DAILY AS NEEDED FOR  KNEE  PAIN 120 tablet 0   vitamin B-12 (CYANOCOBALAMIN) 500 MCG tablet Take 500 mcg by mouth daily.      No current facility-administered medications for this visit.    Review of Systems  Constitutional:  Constitutional negative. HENT: HENT negative.  Eyes: Eyes negative.  Cardiovascular: Positive for irregular heartbeat.  Musculoskeletal: Musculoskeletal negative.  Skin: Skin negative.  Neurological: Neurological negative. Hematologic:  Hematologic/lymphatic negative.  Psychiatric: Psychiatric negative.       Objective:  Objective  Vitals:   08/16/21 1114  BP: (!) 145/78  Pulse: (!) 53  Resp: 20  Temp: 98.3 F (36.8 C)  SpO2: 98%      Physical  Exam HENT:     Head: Normocephalic.     Nose:     Comments: Wearing a mask Eyes:     Pupils: Pupils are equal, round, and reactive to light.  Cardiovascular:     Rate and Rhythm: Normal rate.     Pulses:          Popliteal pulses are 3+ on the right side and 3+ on the left side.       Dorsalis pedis pulses are 2+ on the right side and 2+ on the left side.  Pulmonary:     Effort: Pulmonary effort is normal.  Abdominal:     General: Abdomen is flat.     Palpations: Abdomen is soft. There is mass.  Musculoskeletal:        General: Normal range of motion.     Cervical back: Normal range of motion and neck supple.     Right lower leg: No edema.     Left lower leg: No edema.  Skin:    General: Skin is warm.     Capillary Refill: Capillary refill takes less than 2 seconds.  Neurological:     General: No focal deficit present.     Mental Status: She is alert.  Psychiatric:        Mood and Affect: Mood normal.        Behavior: Behavior normal.        Thought Content: Thought content normal.    Data: Abdominal Aorta Findings:  +-------------+-------+----------+------+----------------+--------+--------  ----+   Location      AP (cm) Trans (cm) PSV    Waveform           Thrombus Comments                                         (cm/s)                                            +-------------+-------+----------+------+----------------+--------+--------  ----+   Proximal      2.10    2.10       88                                                +-------------+-------+----------+------+----------------+--------+--------  ----+   Mid           3.90    3.90       93                                                 +-------------+-------+----------+------+----------------+--------+--------  ----+   Distal        4.80    5.10       141                                fusiform,  turbulent      +-------------+-------+----------+------+----------------+--------+--------  ----+   RT CIA Prox   1.6     1.6        85     biphasic                                   +-------------+-------+----------+------+----------------+--------+--------  ----+   RT CIA Mid                       93     biphasic                                   +-------------+-------+----------+------+----------------+--------+--------  ----+   RT CIA Distal                    124    barely triphasic                           +-------------+-------+----------+------+----------------+--------+--------  ----+   RT EIA Prox   0.9     0.9        109    barley triphasic                           +-------------+-------+----------+------+----------------+--------+--------  ----+   RT EIA Mid                       149    biphasic                                   +-------------+-------+----------+------+----------------+--------+--------  ----+   RT EIA Distal                    139    biphasic                                   +-------------+-------+----------+------+----------------+--------+--------  ----+   LT CIA Prox   1.5     1.5        94     multiphasic                                +-------------+-------+----------+------+----------------+--------+--------  ----+   LT CIA Mid                       101    multiphasic                                +-------------+-------+----------+------+----------------+--------+--------  ----+   LT CIA Distal                    122    biphasic                                   +-------------+-------+----------+------+----------------+--------+--------  ----+   LT EIA Prox   1.0     1.0  114    triphasic                                   +-------------+-------+----------+------+----------------+--------+--------  ----+   LT EIA Mid                       117    biphasic                                   +-------------+-------+----------+------+----------------+--------+--------  ----+   LT EIA Distal                    123    biphasic                                   +-------------+-------+----------+------+----------------+--------+--------  ----+     IVC/Iliac Findings:  +--------+------+--------+--------+     IVC    Patent Thrombus Comments   +--------+------+--------+--------+   IVC Prox patent                     +--------+------+--------+--------+       Summary:  Abdominal Aorta: There is evidence of abnormal dilatation of the mid and  distal Abdominal aorta. The largest aortic measurement is 5.1 cm.  Stenosis:  Widely patent bilateral common and external iliac arteries without  evidence of stenosis.      Assessment/Plan:     76 year old female with recent diagnosis of abdominal aortic aneurysm that is 5 cm in largest diameter.  No previous known aneurysm disease CT scan 2005 there was no evidence of aneurysm there have been no CT scan since that time.  No new back or abdominal pain.  She does have 3+ bilateral popliteal pulses.  We will get her to follow-up in a few weeks with CT scan after her vacation and we will also get popliteal duplexes at that time.  She is going to notify her family members of her recent diagnosis of aneurysm disease particularly her son who is a lifelong smoker.  We discussed the signs and symptoms of rupture and she demonstrates good understanding the presence of her husband     Ann Sandy MD [facility]

## 2021-08-18 ENCOUNTER — Ambulatory Visit
Admission: RE | Admit: 2021-08-18 | Discharge: 2021-08-18 | Disposition: A | Discharge status: Home / Self Care | Payer: Medicare Other | Source: Ambulatory Visit | Attending: Vascular Surgery | Admitting: Vascular Surgery

## 2021-08-18 DIAGNOSIS — R918 Other nonspecific abnormal finding of lung field: Secondary | ICD-10-CM | POA: Diagnosis not present

## 2021-08-18 DIAGNOSIS — I714 Abdominal aortic aneurysm, without rupture, unspecified: Secondary | ICD-10-CM

## 2021-08-18 DIAGNOSIS — K5730 Diverticulosis of large intestine without perforation or abscess without bleeding: Secondary | ICD-10-CM | POA: Diagnosis not present

## 2021-08-18 MED ORDER — IOPAMIDOL (ISOVUE-370) INJECTION 76%
60.0000 mL | Freq: Once | INTRAVENOUS | Status: AC | PRN
  Administered 2021-08-18: 60 mL via INTRAVENOUS

## 2021-08-21 ENCOUNTER — Other Ambulatory Visit: Payer: Self-pay | Admitting: *Deleted

## 2021-08-21 DIAGNOSIS — M79606 Pain in leg, unspecified: Secondary | ICD-10-CM

## 2021-08-26 ENCOUNTER — Other Ambulatory Visit: Payer: Self-pay | Admitting: Family Medicine

## 2021-08-28 NOTE — Telephone Encounter (Signed)
Plz address in Dr. Synthia Innocent absence.  Name of Medication: Clonazepam Name of Pharmacy: [facility] or Written Date and Quantity: 05/09/21, #60 Last Office Visit and Type: 07/19/21, AWV prt 2 Next Office Visit and Type: 07/23/22, AWV prt 2 Last Controlled Substance Agreement Date: 03/16/15 Last UDS: 03/16/15

## 2021-08-29 NOTE — Telephone Encounter (Signed)
Sent. Thanks.   

## 2021-08-30 ENCOUNTER — Ambulatory Visit (INDEPENDENT_AMBULATORY_CARE_PROVIDER_SITE_OTHER): Payer: Medicare Other | Admitting: Vascular Surgery

## 2021-08-30 ENCOUNTER — Ambulatory Visit (HOSPITAL_COMMUNITY)
Admission: RE | Admit: 2021-08-30 | Discharge: 2021-08-30 | Disposition: A | Discharge status: Home / Self Care | Payer: Medicare Other | Source: Ambulatory Visit | Attending: Vascular Surgery | Admitting: Vascular Surgery

## 2021-08-30 ENCOUNTER — Ambulatory Visit (INDEPENDENT_AMBULATORY_CARE_PROVIDER_SITE_OTHER)
Admission: RE | Admit: 2021-08-30 | Discharge: 2021-08-30 | Disposition: A | Discharge status: Home / Self Care | Payer: Medicare Other | Source: Ambulatory Visit | Attending: Vascular Surgery | Admitting: Vascular Surgery

## 2021-08-30 ENCOUNTER — Other Ambulatory Visit: Payer: Self-pay

## 2021-08-30 ENCOUNTER — Encounter: Payer: Self-pay | Admitting: Vascular Surgery

## 2021-08-30 VITALS — BP 147/68 | HR 57 | Temp 98.4°F | Resp 20 | Ht 63.0 in | Wt 141.0 lb

## 2021-08-30 DIAGNOSIS — M79606 Pain in leg, unspecified: Secondary | ICD-10-CM | POA: Diagnosis not present

## 2021-08-30 DIAGNOSIS — I714 Abdominal aortic aneurysm, without rupture, unspecified: Secondary | ICD-10-CM | POA: Diagnosis not present

## 2021-08-30 NOTE — Progress Notes (Signed)
Patient ID: Ann White, female   DOB: 1945/11/29, 76 y.o.   MRN: [id number]  Reason for Consult: Follow-up   Referred by Ria Bush, MD  Subjective:     HPI:  Ann White is a 76 y.o. female recently found to have abdominal aortic aneurysm on duplex.  She also has known bilateral carotid artery stenosis.  She is here today to follow-up with CT scan and also bilateral lower extremity duplexes.  She does not have any lower extremity pain denies any new back or abdominal pain.  Past Medical History:  Diagnosis Date   Allergy    Anxiety    Arthritis    Carotid stenosis    a. 02/2016 Carotid U/S: Bilateral 40-59%; b. 03/2017 Carotid U/S: RICA 22-02%, LICA 5-42%; c. 70/6237 Carotid U/S: bilat 40-59% ICA dzs.   Cervical spondylosis    s/p spine injections   CKD (chronic kidney disease), stage III (HCC)    Depression    Ectatic abdominal aorta ([place]) 2016   rpt [place] 5 yrs   GERD (gastroesophageal reflux disease)    History of chicken pox    History of diverticulitis of colon    HLD (hyperlipidemia)    HTN (hypertension)    Irritable bowel syndrome with constipation    Lichen sclerosus et atrophicus    NICM (nonischemic cardiomyopathy) ([place])    a. 04/2017 Echo: EF 35-40%, diff HK; b. 05/2017 MV: EF 56%, no ischemia/infarct; c. 04/2018 Echo: EF 55-60%, no rwma, Gr2 DD; d. 03/2020 Echo: EF 40-45%, nl PASP, mod dil LA, mild to mod dil RA, Triv MR.   Obesity    Persistent atrial fibrillation ([place])    a. Dx 03/2020. CHA2DS2VASc = 5-->xarelto 15mg  daily.   Premature atrial contraction    a. 04/2017 48hr Holter: Predominant rhythm - sinus. Rare PVC's, freq PAC's with freq atrial runs up to 34 beats-->metoprolol started.   Seasonal allergic rhinitis    Family History  Problem Relation Age of Onset   CAD Mother 30       MI   Hypertension Mother    Parkinson's disease Father    Diabetes Father    CAD Father 109       MI   Hyperlipidemia Sister    Cancer Neg Hx     Stroke Neg Hx    Colon cancer Neg Hx    Breast cancer Neg Hx    Colon polyps Neg Hx    Esophageal cancer Neg Hx    Stomach cancer Neg Hx    Rectal cancer Neg Hx    Past Surgical History:  Procedure Laterality Date   bladder tack  1990s   BREAST BIOPSY Right 03/05/2012    benign   CARDIOVERSION N/A 05/17/2020   Procedure: CARDIOVERSION;  Surgeon: Nelva Bush, MD;  Location: [facility];  Service: Cardiovascular;  Laterality: N/A;   CARPAL TUNNEL RELEASE  2004, 2013   bilateral   CHOLECYSTECTOMY  1990s   COLONOSCOPY  2002   COLONOSCOPY  06/2013   mod diverticulosis, 3 tubular adenomas, int hem rpt 5 yrs [facility] Plan)   COLONOSCOPY  06/2018   TA,c olonic angiodysplastic lesion, mod diverticulosis, rpt 5 yrs [facility] Plan)   DEXA  07/2011   normal, T score -0.3   ESOPHAGOGASTRODUODENOSCOPY ENDOSCOPY  2004   normal ([facility])   TOTAL ABDOMINAL HYSTERECTOMY W/ BILATERAL SALPINGOOPHORECTOMY  1990s   complete, dysmenorrhea and fibroids    Short Social History:  Social History   Tobacco Use   Smoking  status: Former    Packs/day: 0.25    Years: 3.00    Pack years: 0.75    Types: Cigarettes    Quit date: 1990    Years since quitting: 33.1   Smokeless tobacco: Never   Tobacco comments:    minimal smoking history  Substance Use Topics   Alcohol use: No    Alcohol/week: 0.0 standard drinks    Allergies  Allergen Reactions   Sulfa Antibiotics Swelling    Oral swelling   Nexletol [Bempedoic Acid]     Dizziness, "eyes felt funny"   Tricor [Fenofibrate] Other (See Comments)    myalgias   Zetia [Ezetimibe] Other (See Comments)    Dizziness   Contrast Media [Iodinated Contrast Media] Other (See Comments)    Oral contrast caused mouth blisters   Lipitor [Atorvastatin] Other (See Comments)    myalgias   Pravastatin Other (See Comments)    myalgias    Current Outpatient Medications  Medication Sig Dispense Refill   acetaminophen (TYLENOL) 500 MG tablet Take 500-1,000 mg by mouth every  6 (six) hours as needed (knee pain.).     apixaban (ELIQUIS) 5 MG TABS tablet Take 5 mg by mouth 2 (two) times daily.     cetirizine (ZYRTEC) 10 MG tablet Take 10 mg by mouth daily.      Cholecalciferol (VITAMIN D3) 1000 UNITS CAPS Take 1 capsule (1,000 Units total) by mouth daily.     clonazePAM (KLONOPIN) 1 MG tablet Take 1 tablet by mouth twice daily as needed 60 tablet 0   diphenhydrAMINE (BENADRYL) 50 MG capsule Take 50 mg by mouth 1 hour prior to your procedure. 1 capsule 0   docusate sodium (COLACE) 100 MG capsule Take 100-200 mg by mouth daily as needed (constipation.).     dofetilide (TIKOSYN) 125 MCG capsule TAKE 1 CAPSULE BY MOUTH TWO TIMES A DAY 180 capsule 3   Evolocumab (REPATHA SURECLICK) 509 MG/ML SOAJ Inject 1 pen into the skin every 14 (fourteen) days. 6 mL 3   gabapentin (NEURONTIN) 300 MG capsule TAKE ONE CAPSULE EVERY MORNING AND 2 CAPSULES AT BEDTIME 270 capsule 2   losartan (COZAAR) 25 MG tablet Take 1 tablet by mouth once daily 90 tablet 1   Multiple Vitamin (MULTIVITAMIN WITH MINERALS) TABS tablet Take 1 tablet by mouth daily.     oxymetazoline (AFRIN) 0.05 % nasal spray Place 1 spray into both nostrils 2 (two) times daily as needed for congestion.     pantoprazole (PROTONIX) 40 MG tablet Take 1 tablet by mouth once daily 90 tablet 0   predniSONE (DELTASONE) 50 MG tablet One tablet (50mg ) 13 hours prior to procedure; one tablet (50mg ) 7 hours prior to procedure and then one tablet (50 mg) one hour prior to procedure. 3 tablet 0   rosuvastatin (CRESTOR) 5 MG tablet Take 1 tablet (5 mg total) by mouth 3 (three) times a week. 36 tablet 3   sertraline (ZOLOFT) 100 MG tablet Take 1 tablet by mouth once daily 90 tablet 0   traMADol (ULTRAM) 50 MG tablet TAKE 1 TABLET BY MOUTH THREE TIMES DAILY AS NEEDED FOR  KNEE  PAIN 120 tablet 0   vitamin B-12 (CYANOCOBALAMIN) 500 MCG tablet Take 500 mcg by mouth daily.      No current facility-administered medications for this visit.     Review of Systems  Constitutional:  Constitutional negative. HENT: HENT negative.  Eyes: Eyes negative.  Respiratory: Respiratory negative.  Cardiovascular: Positive for irregular heartbeat.  GI: Gastrointestinal  negative.  Musculoskeletal: Musculoskeletal negative.  Skin: Skin negative.  Neurological: Neurological negative. Hematologic: Hematologic/lymphatic negative.  Psychiatric: Psychiatric negative.       Objective:  Objective   Vitals:   08/30/21 1528  BP: (!) 147/68  Pulse: (!) 57  Resp: 20  Temp: 98.4 F (36.9 C)  SpO2: 96%  Weight: 141 lb (64 kg)  Height: 5\' 3"  (1.6 m)   Body mass index is 24.98 kg/m.  Physical Exam HENT:     Head: Normocephalic.     Nose:     Comments: Wearing a mask Eyes:     Pupils: Pupils are equal, round, and reactive to light.  Cardiovascular:     Pulses:          Femoral pulses are 2+ on the right side and 2+ on the left side.      Popliteal pulses are 3+ on the right side and 3+ on the left side.  Pulmonary:     Effort: Pulmonary effort is normal.  Abdominal:     General: Abdomen is flat.     Palpations: Abdomen is soft. There is mass.     Tenderness: There is no right CVA tenderness.  Musculoskeletal:     Right lower leg: No edema.     Left lower leg: No edema.  Skin:    General: Skin is warm and dry.     Capillary Refill: Capillary refill takes less than 2 seconds.  Neurological:     General: No focal deficit present.     Mental Status: She is alert.  Psychiatric:        Mood and Affect: Mood normal.        Behavior: Behavior normal.        Thought Content: Thought content normal.    Data: +---------------+-------+-----------+---------+--------+-----+--------+   Right Popliteal AP (cm) Transv (cm) Waveform  Stenosis Shape Comments   +---------------+-------+-----------+---------+--------+-----+--------+   Proximal        0.73    0.69        biphasic                             +---------------+-------+-----------+---------+--------+-----+--------+   Mid             0.71    0.77        biphasic                            +---------------+-------+-----------+---------+--------+-----+--------+   Distal          0.60    0.64        triphasic                           +---------------+-------+-----------+---------+--------+-----+--------+   +--------------+-------+-----------+---------+--------+-----+--------+   Left Popliteal AP (cm) Transv (cm) Waveform  Stenosis Shape Comments   +--------------+-------+-----------+---------+--------+-----+--------+   Proximal       0.69    0.80        biphasic                            +--------------+-------+-----------+---------+--------+-----+--------+   Mid            0.57    0.62        triphasic                           +--------------+-------+-----------+---------+--------+-----+--------+  Distal         0.52    0.60        biphasic                            +--------------+-------+-----------+---------+--------+-----+--------+     Summary:  No evidence of popliteal aneurysm.   We reviewed her CT angio together and I have measured the aneurysm up to 5.2 cm in transverse diameter.     Assessment/Plan:     76 year old female with 5.2 cm abdominal aortic aneurysm.  We discussed the signs and symptoms of rupture as well as the risk being around 5% for a female her size.  We discussed the options being continued monitoring versus open versus endovascular repair.  She appears to be a candidate for endovascular repair.  We will get her cleared by cardiology she is followed by Dr. Saunders Revel she is also followed by Dr. Quentin Ore for atrial fibrillation.  She will need to hold Eliquis 2 days prior to the procedure.  I discussed the average hospitalization of 1 overnight unless is necessary to cut down the common femoral arteries which would necessitate 2-3 night stay.  We discussed risks of injury to surrounding structures both in the  groins and in the abdomen possible need for conversion to open procedure.  We also discussed the likelihood of further procedures in the future given that we are repairing this endovascularly.  She demonstrates very good understanding the presence of her husband who is also had an EVAR in the past.  We will get her scheduled pending cardiac clearance.     Waynetta Sandy MD [facility]

## 2021-09-08 ENCOUNTER — Other Ambulatory Visit: Payer: Self-pay

## 2021-09-08 MED ORDER — APIXABAN 5 MG PO TABS: 5.0000 mg | ORAL_TABLET | Freq: Two times a day (BID) | ORAL | 5 refills | Status: DC

## 2021-09-08 NOTE — Telephone Encounter (Signed)
Prescription refill request for Eliquis received. Indication: Atrial fib Last office visit: 07/26/21  R Ursuy PA-C Scr: 1.20 on 07/19/21 Age: 76 Weight: 63.4kg  Based on above findings Eliquis 5mg  twice daily is the appropriate dose.  Refill approved.

## 2021-09-08 NOTE — Telephone Encounter (Signed)
*  STAT* If patient is at the pharmacy, call can be transferred to refill team.   1. Which medications need to be refilled? (please list name of each medication and dose if known) Eliquis  2. Which pharmacy/location (including street and city if local pharmacy) is medication to be sent to? [place]  3. Do they need a 30 day or 90 day supply? [place]

## 2021-09-08 NOTE — Telephone Encounter (Signed)
Refill Request.  

## 2021-09-11 ENCOUNTER — Telehealth: Payer: Self-pay | Admitting: Internal Medicine

## 2021-09-11 NOTE — Telephone Encounter (Signed)
Patient with diagnosis of atrial fibrillation on Eliquis for anticoagulation.    Procedure: EVAR Date of procedure: TBD   CHA2DS2-VASc Score = 4   This indicates a 4.8% annual risk of stroke. The patient's score is based upon: CHF History: 1 HTN History: 1 Diabetes History: 0 Stroke History: 0 Vascular Disease History: 0 Age Score: 1 Gender Score: 1     CrCl 34 Platelet count 187  Per chart patient had DVT (provoked) April 2017.  Completed 4+ months of anticoagulation.    Per office protocol, patient can hold Eliquis for 3 days prior to procedure.   Patient will not need bridging with Lovenox (enoxaparin) around procedure.  For orthopedic procedures please be sure to resume therapeutic (not prophylactic) dosing.

## 2021-09-11 NOTE — Telephone Encounter (Signed)
° ° ° °  Pre-operative Risk Assessment    Patient Name: Ann White  DOB: 1946-07-02 MRN: [id number]      Request for Surgical Clearance    Procedure:   EVAR  Date of Surgery:  Clearance TBD                                 Surgeon:  Dr. Doristine Locks Group or Practice Name:  [facility] Phone number:  [phone] Fax number:  [phone] - ATTN: [place]   Type of Clearance Requested:   - Medical  - Pharmacy:  Hold Apixaban (Eliquis) 3 days prior procedure    Type of Anesthesia:  General    Additional requests/questions:   [facility] asking if they can have clearance before pt's 03/10 appt so they can get pt schedule   Signed, Angeline Kirby Funk   09/11/2021, 10:58 AM

## 2021-09-12 NOTE — Telephone Encounter (Addendum)
° °  Name: Ann White DOB: 04/06/1946  MRN: [id number]  Primary Cardiologist: Nelva Bush, MD  Chart reviewed as part of pre-operative protocol coverage.   Past medical history Nonischemic cardiomyopathy EF 35-40 in 2018 >>improved to normal Persistent atrial fibrillation Rx: Apixaban; dofetilide Carotid artery disease Abdominal aoritc aneurysm  History of right lower extremity DVT Hypertension Hyperlipidemia Chronic kidney disease  Prior CV studies AAA [place] 08/15/21: 5.1 cm  Echo 09/2020: EF 60-65, trivial MR, mild TR Carotid US 07/2020: Bilateral ICA 40-59% Myoview 05/2017: No ischemia  Assessment She was last seen by Dr. Saunders Revel in September 2022.  She was last seen by Tommye Standard, PA-C for electrophysiology in January 2023.  RCRI:  Perioperative Risk of Major Cardiac Event is (%): 6.6 (high risk) DASI:  Functional Capacity in METs is: 5.62 (functional status is good )  Patient was contacted 09/12/2021 in reference to pre-operative risk assessment for pending surgery as outlined below.    Since last seen, Ann White has done well without chest pain, shortness of breath, syncope.    Reviewed case as well with Dr. Saunders Revel.   Recommendations: Based on ACC/AHA guidelines, the patient is at acceptable risk for the planned procedure and may proceed without further cardiovascular testing.  The patient can hold Apixaban (Eliquis) for 3 days prior to the procedure and resume as soon as possible post op when felt to be safe.   Interruption of dofetilide should be avoided if possible; if she misses more than 3 consecutive doses, monitored reinitiation will need to be coordinated with EP.   QT-prolonging medications should be avoided.  Please call with questions. Richardson Dopp, PA-C 09/12/2021, 1:13 PM

## 2021-09-12 NOTE — Telephone Encounter (Signed)
Notes faxed to surgeon. This phone note will be removed from the preop pool. Richardson Dopp, PA-C  09/12/2021 1:38 PM

## 2021-09-12 NOTE — Telephone Encounter (Signed)
It is fine to proceed with EVAR without further cardiac testing/intervention.  Apixaban should be held for 3 days before the procedure.  Interruption of dofetilide should be avoided if possible; if she misses more than 3 consecutive doses, monitored reinitiation will need to be coordinated with EP.  QT-prolonging medications should be avoided.  Nelva Bush, MD [facility]

## 2021-09-13 HISTORY — PX: ABDOMINAL AORTIC ANEURYSM REPAIR: SHX42

## 2021-09-21 ENCOUNTER — Other Ambulatory Visit: Payer: Self-pay

## 2021-09-21 DIAGNOSIS — I714 Abdominal aortic aneurysm, without rupture, unspecified: Secondary | ICD-10-CM

## 2021-09-22 ENCOUNTER — Ambulatory Visit (INDEPENDENT_AMBULATORY_CARE_PROVIDER_SITE_OTHER): Payer: Medicare Other | Admitting: Internal Medicine

## 2021-09-22 ENCOUNTER — Other Ambulatory Visit: Payer: Self-pay

## 2021-09-22 ENCOUNTER — Encounter: Payer: Self-pay | Admitting: Internal Medicine

## 2021-09-22 ENCOUNTER — Other Ambulatory Visit: Payer: Self-pay | Admitting: Family Medicine

## 2021-09-22 VITALS — BP 150/80 | HR 49 | Ht 63.0 in | Wt 142.0 lb

## 2021-09-22 DIAGNOSIS — Z79899 Other long term (current) drug therapy: Secondary | ICD-10-CM | POA: Diagnosis not present

## 2021-09-22 DIAGNOSIS — I4819 Other persistent atrial fibrillation: Secondary | ICD-10-CM

## 2021-09-22 DIAGNOSIS — Z01810 Encounter for preprocedural cardiovascular examination: Secondary | ICD-10-CM | POA: Diagnosis not present

## 2021-09-22 DIAGNOSIS — I428 Other cardiomyopathies: Secondary | ICD-10-CM

## 2021-09-22 DIAGNOSIS — I10 Essential (primary) hypertension: Secondary | ICD-10-CM | POA: Diagnosis not present

## 2021-09-22 DIAGNOSIS — E785 Hyperlipidemia, unspecified: Secondary | ICD-10-CM

## 2021-09-22 DIAGNOSIS — I7143 Infrarenal abdominal aortic aneurysm, without rupture: Secondary | ICD-10-CM | POA: Diagnosis not present

## 2021-09-22 DIAGNOSIS — Z1231 Encounter for screening mammogram for malignant neoplasm of breast: Secondary | ICD-10-CM

## 2021-09-22 MED ORDER — LOSARTAN POTASSIUM 25 MG PO TABS
50.0000 mg | ORAL_TABLET | Freq: Every day | ORAL | 3 refills | Status: DC
Start: 2021-09-22 — End: 2021-11-24

## 2021-09-22 NOTE — Progress Notes (Signed)
Follow-up Outpatient Visit Date: 09/22/2021  Primary Care Provider: Ria Bush, MD [place] [address]  Chief Complaint: Follow-up cardiomyopathy  HPI:  Ms. Ann White is a 76 y.o. female with history of nonischemic cardiomyopathy diagnosed in 04/2017 (MPI without perfusion defects LVEF as low as 35-40% but normalized on repeat echo in 04/2018), persistent atrial fibrillation diagnosed in 03/2020, carotid artery stenosis, abdominal aortic aneurysm, right calf DVT, hypertension, hyperlipidemia, and chronic kidney disease, who presents for follow-up of cardiomyopathy.  I last saw her in 03/2021, which time she was feeling fairly well other than mild fatigue at times.  She has continued to follow with Dr. Quentin Ore for management of her atrial fibrillation controlled with dofetilide.  Recent evaluation of the abdominal aorta showed significant enlargement.  She is scheduled for EVAR with Dr. Donzetta Matters later this month.  Today, [name] repots that she has been feeling well.  She denies chest pain, shortness of breath, palpitations, lightheadedness, edema, leg pain, and abdominal pain.  She bruises easily but otherwise does not note any significant bleeding, remaining on apixaban and dofetilide for management of her atrial fibrillation.  She walks some but otherwise does not exercise regularly.  --------------------------------------------------------------------------------------------------  Cardiovascular History & Procedures: Cardiovascular Problems: Persistent atrial fibrillation Nonischemic cardiomyopathy Carotid artery stenosis   Risk Factors: Hypertension, hyperlipidemia, and age greater than 71   Cath/PCI: None   CV Surgery: None   EP Procedures and Devices: DCCV (05/17/20) 48 hour Holter monitor (04/29/17): Sinus rhythm with frequent PACs and atrial runs, lasting up to 34 beats.   Non-Invasive Evaluation(s): ABIs (08/30/2021): Normal bilateral  ABIs/TBI's. Carotid Doppler (08/15/2021): Moderate bilateral ICA stenoses of 40-59%.  Greater than 50% stenoses in both ECAs.  Antegrade vertebral artery flow.  Disturbed flow in both subclavian arteries. AAA duplex (08/15/2021): Abnormal dilation of the mid and distal abdominal aorta measuring up to 5.1 cm. TTE (09/20/2020): Normal LV size wall thickness.  LVEF 60-65% with normal wall motion.  Grade 1 diastolic dysfunction.  Normal RV size and function.  Severe left and mild to moderate right atrial enlargement.  Trivial mitral and mild tricuspid regurgitation. Carotid Doppler (08/15/2020): 40-59% stenoses in both carotid arteries.  Greater than 50% stenosis of right external carotid artery.  Antegrade vertebral artery flow. TTE (04/14/2020): Normal LV size and wall thickness.  LVEF 40-45% with global hypokinesis.  Normal RV size and function.  Moderate left atrial and mild to moderate right atrial enlargement.  Trivial mitral and mild tricuspid regurgitation. Carotid Doppler (04/17/2019): 40-59% stenosis in the carotid arteries bilaterally.  Antegrade flow in the vertebral arteries.  Normal subclavian flow. TTE (04/24/18): Normal LV size.  LVEF 55-60% with normal wall motion.  Grade 2 diastolic dysfunction.  Mild left atrial enlargement.  Normal RV size and function.  Mild pulmonary hypertension (PASP 41 mmHg). Exercise MPI (06/12/17): No evidence of ischemia. LVEF 56%. Hypertensive blood pressure response to exercise. Echo (04/29/17): Normal LV size with moderately reduced contraction. LVEF 35-40% with global hypokinesis. Mild MR. Mild left atrial enlargement. Normal RV size and function. Normal PA pressure. Carotid Doppler (03/25/17): Heterogeneous plaque, bilaterally. Stable, 40-59% RICA stenosis. EYCXKG,8-18% LICA stenosis. Stable, >50% RECA stenosis. Patent vertebral arteries with antegrade flow. Normal subclavian arteries, bilaterally.  Recent CV Pertinent Labs: Lab Results  Component Value Date    CHOL 132 06/23/2021   CHOL 215 (H) 03/24/2021   HDL 59 06/23/2021   HDL 52 03/24/2021   LDLCALC 52 06/23/2021   LDLCALC 132 (H) 03/24/2021  LDLDIRECT 132.0 03/18/2019   TRIG 103 06/23/2021   CHOLHDL 2.2 06/23/2021   INR 1.5 09/22/2015   INR 7.0 Repeated and verified X2. (HH) 06/13/2015   K 4.4 07/19/2021   MG 2.3 07/26/2021   BUN 21 07/19/2021   BUN 23 03/24/2021   CREATININE 1.20 07/19/2021   CREATININE 1.19 07/26/2011    Past medical and surgical history were reviewed and updated in EPIC.  Current Meds  Medication Sig   acetaminophen (TYLENOL) 500 MG tablet Take 500-1,000 mg by mouth every 6 (six) hours as needed (knee pain.).   apixaban (ELIQUIS) 5 MG TABS tablet Take 1 tablet (5 mg total) by mouth 2 (two) times daily.   cetirizine (ZYRTEC) 10 MG tablet Take 10 mg by mouth daily.    Cholecalciferol (VITAMIN D3) 1000 UNITS CAPS Take 1 capsule (1,000 Units total) by mouth daily.   clonazePAM (KLONOPIN) 1 MG tablet Take 1 tablet by mouth twice daily as needed   diphenhydrAMINE (BENADRYL) 50 MG capsule Take 50 mg by mouth 1 hour prior to your procedure.   docusate sodium (COLACE) 100 MG capsule Take 100-200 mg by mouth daily as needed (constipation.).   dofetilide (TIKOSYN) 125 MCG capsule TAKE 1 CAPSULE BY MOUTH TWO TIMES A DAY   Evolocumab (REPATHA SURECLICK) 889 MG/ML SOAJ Inject 1 pen into the skin every 14 (fourteen) days.   gabapentin (NEURONTIN) 300 MG capsule TAKE ONE CAPSULE EVERY MORNING AND 2 CAPSULES AT BEDTIME   losartan (COZAAR) 25 MG tablet Take 1 tablet by mouth once daily   Multiple Vitamin (MULTIVITAMIN WITH MINERALS) TABS tablet Take 1 tablet by mouth daily.   oxymetazoline (AFRIN) 0.05 % nasal spray Place 1 spray into both nostrils 2 (two) times daily as needed for congestion.   pantoprazole (PROTONIX) 40 MG tablet Take 1 tablet by mouth once daily   rosuvastatin (CRESTOR) 5 MG tablet Take 1 tablet (5 mg total) by mouth 3 (three) times a week.   sertraline  (ZOLOFT) 100 MG tablet Take 1 tablet by mouth once daily   traMADol (ULTRAM) 50 MG tablet TAKE 1 TABLET BY MOUTH THREE TIMES DAILY AS NEEDED FOR  KNEE  PAIN   vitamin B-12 (CYANOCOBALAMIN) 500 MCG tablet Take 500 mcg by mouth daily.     Allergies: Sulfa antibiotics, Nexletol [bempedoic acid], Tricor [fenofibrate], Zetia [ezetimibe], Contrast media [iodinated contrast media], Lipitor [atorvastatin], and Pravastatin  Social History   Tobacco Use   Smoking status: Former    Packs/day: 0.25    Years: 3.00    Pack years: 0.75    Types: Cigarettes    Quit date: 1990    Years since quitting: 33.2   Smokeless tobacco: Never   Tobacco comments:    minimal smoking history  Vaping Use   Vaping Use: Never used  Substance Use Topics   Alcohol use: No    Alcohol/week: 0.0 standard drinks   Drug use: No    Family History  Problem Relation Age of Onset   CAD Mother 30       MI   Hypertension Mother    Parkinson's disease Father    Diabetes Father    CAD Father 27       MI   Hyperlipidemia Sister    Cancer Neg Hx    Stroke Neg Hx    Colon cancer Neg Hx    Breast cancer Neg Hx    Colon polyps Neg Hx    Esophageal cancer Neg Hx    Stomach  cancer Neg Hx    Rectal cancer Neg Hx     Review of Systems: A 12-system review of systems was performed and was negative except as noted in the HPI.  --------------------------------------------------------------------------------------------------  Physical Exam: BP (!) 150/80 (BP Location: Left Arm, Patient Position: Sitting, Cuff Size: Normal)    Pulse (!) 49    Ht '5\' 3"'$  (1.6 m)    Wt 142 lb (64.4 kg)    SpO2 98%    BMI 25.15 kg/m  Repeat BP: 170/70  General:  NAD. Neck: No JVD or HJR. Lungs: Clear to auscultation bilaterally without wheezes or crackles. Heart: Bradycardic but regular without murmurs, rubs, or gallops. Abdomen: Soft, nontender, nondistended. Extremities: No lower extremity edema.  EKG:  Sinus bradycardia with 1st  degree AV block (PR interval 222 ms) and nonspecific T wave changes.  PR interval slightly longer compared to 07/26/2021.  Otherwise, no significant change.  Lab Results  Component Value Date   WBC 4.6 07/19/2021   HGB 10.4 (L) 07/19/2021   HCT 30.8 (L) 07/19/2021   MCV 89.2 07/19/2021   PLT 187.0 07/19/2021    Lab Results  Component Value Date   NA 140 07/19/2021   K 4.4 07/19/2021   CL 105 07/19/2021   CO2 29 07/19/2021   BUN 21 07/19/2021   CREATININE 1.20 07/19/2021   GLUCOSE 82 07/19/2021   ALT 13 06/23/2021    Lab Results  Component Value Date   CHOL 132 06/23/2021   HDL 59 06/23/2021   LDLCALC 52 06/23/2021   LDLDIRECT 132.0 03/18/2019   TRIG 103 06/23/2021   CHOLHDL 2.2 06/23/2021    --------------------------------------------------------------------------------------------------  ASSESSMENT AND PLAN: Nonischemic cardiomyopathy: Ms. Estabrooks appears euvolemic with NYHA class I symptoms.  LVEF last year had normalized.  Continue losartan with dose increase as detailed below; defer adding beta blocker in the setting of resting bradycardia and mild first degree AV block.  Persistent atrial fibrillation: Ms. Latka is maintaining sinus rhythm.  Continue dofetilide and apixaban under the direction of Dr. Quentin Ore.  QT interval remains appropriate.  AAA: Noted on screening study without ongoing symptoms.  Ms. Golembeski is scheduled for EVAR with Dr. Donzetta Matters later this month.  We will continue secondary prevention of evolocumab and low-dose rosuvastatin.  BP control suboptimal; we will increase losartan to 50 mg daily with BMP today and in ~2 weeks (can be done during admission for AAA repair).  Hypertension: BP moderately elevated.  We will increase losartan to 50 mg daily.  We will check a BMP today; this should be repeated in ~2 weeks (can be done when Ms. Cerny is an inpatient for AAA repair).  Hyperlipidemia: Lipids at goal.  Continue rosuvastatin and evolocumab.  Preop  evaluation: Ms. Puopolo does not have any unstable cardiac symptoms.  She can proceed with planned EVAR.  Apixaban should be held for 2 days prior to the procedure and restarted when felt safe to do so by Dr. Donzetta Matters.  Dofetilide should not be interrupted unless there is significant worsening in renal function or prolongation of the QT interval.  QT-prolonging medications should be avoided.  Follow-up: Return to clinic in 2 months.  Nelva Bush, MD 09/22/2021 10:12 AM

## 2021-09-22 NOTE — Patient Instructions (Addendum)
Medication Instructions:  ?Please increase ?Losartan to 50 mg daily  ? ?*If you need a refill on your cardiac medications before your next appointment, please call your pharmacy* ? ? ?Lab Work: ? ?BMET today ? ?Testing/Procedures: ?No new testings ? ? ?Follow-Up: ?At [facility], you and your health needs are our priority.  As part of our continuing mission to provide you with exceptional heart care, we have created designated Provider Care Teams.  These Care Teams include your primary Cardiologist (physician) and Advanced Practice Providers (APPs -  Physician Assistants and Nurse Practitioners) who all work together to provide you with the care you need, when you need it. ? ?We recommend signing up for the patient portal called "MyChart".  Sign up information is provided on this After Visit Summary.  MyChart is used to connect with patients for Virtual Visits (Telemedicine).  Patients are able to view lab/test results, encounter notes, upcoming appointments, etc.  Non-urgent messages can be sent to your provider as well.   ?To learn more about what you can do with MyChart, go to [email or web address].   ? ?Your next appointment:   ?2 month(s) ? ?The format for your next appointment:   ?In Person ? ?Provider:   ?Nelva Bush, MD ? ? ? ?

## 2021-09-23 ENCOUNTER — Encounter: Payer: Self-pay | Admitting: Internal Medicine

## 2021-09-23 DIAGNOSIS — I7143 Infrarenal abdominal aortic aneurysm, without rupture: Secondary | ICD-10-CM | POA: Insufficient documentation

## 2021-09-23 LAB — BASIC METABOLIC PANEL
BUN/Creatinine Ratio: 16 (ref 12–28)
BUN: 20 mg/dL (ref 8–27)
CO2: 23 mmol/L (ref 20–29)
Calcium: 9.5 mg/dL (ref 8.7–10.3)
Chloride: 103 mmol/L (ref 96–106)
Creatinine, Ser: 1.23 mg/dL — ABNORMAL HIGH (ref 0.57–1.00)
Glucose: 87 mg/dL (ref 70–99)
Potassium: 5.2 mmol/L (ref 3.5–5.2)
Sodium: 142 mmol/L (ref 134–144)
eGFR: 46 mL/min/{1.73_m2} — ABNORMAL LOW (ref >59)

## 2021-09-26 ENCOUNTER — Telehealth: Payer: Self-pay | Admitting: *Deleted

## 2021-09-26 DIAGNOSIS — I10 Essential (primary) hypertension: Secondary | ICD-10-CM

## 2021-09-26 DIAGNOSIS — Z79899 Other long term (current) drug therapy: Secondary | ICD-10-CM

## 2021-09-26 DIAGNOSIS — E875 Hyperkalemia: Secondary | ICD-10-CM

## 2021-09-26 NOTE — Telephone Encounter (Signed)
Spoke with pt. Notified of lab results and Dr. Darnelle Bos recc.  ?Pt voiced understanding.  ?Pt will have BMET in our office this Friday 3/17 at 2:15 PM.  ?Pt has pre op appt earlier that day in [place], in case pt has to come at later time, noted in appt notes.  ? ? ?

## 2021-09-26 NOTE — Telephone Encounter (Signed)
-----   Message from Nelva Bush, MD sent at 09/25/2021  7:16 AM EDT ----- ?Please let Ann White know that her kidney function and electrolytes are stable, though her potassium is upper normal.  Given escalation of losartan at Friday's visit and upcoming vascular surgery, I recommend that we recheck a BMP at the end of this week or early next week. ?

## 2021-09-27 ENCOUNTER — Telehealth: Payer: Self-pay

## 2021-09-27 NOTE — Telephone Encounter (Signed)
-----   Message from Rollen Sox, [facility] sent at 09/27/2021  9:38 AM EDT ----- ?Regarding: FW: Evolocumab assistance ?Macee Venables can you look into this? ? ?----- Message ----- ?From: Nelva Bush, MD ?Sent: 09/27/2021   8:23 AM EDT ?To: [facility] ?Subject: Evolocumab assistance                         ? ?Good morning, ? ?I saw Ms. Herrmann on Friday in the office.  She mentioned that she was nearing the end of her supply of evolocumab and was concerned that she had not received any further information regarding pharmacy assistance/grant money to help defray the cost.  Do you have any information regarding the status of her medication assistance or other ways for her to continue procuring this medication?  Thanks. ? ?Gerald Stabs End ? ? ?

## 2021-09-27 NOTE — Telephone Encounter (Signed)
Called ans informed pt that they were approved for grant to cover repatha. Tried to conference call the pharmacy to provide them with info but they never came to the phone. Pt advised me that they would be ok receiving the information via mail.  ?PATIENT ?Ann White ?  ?STATUS  ?Active ?  ?START DATE ?09/10/2021 ?  ?END DATE ?09/09/2022 ?  ?ASSISTANCE TYPE ?Co-pay ?  ?PAID ?$0.00 ?  ?PENDING ?$0.00 ?  ?BALANCE ?$2500.00 ?Pharmacy Card ?CARD NO. ?[id number] ?  ?CARD STATUS ?Active ?  ?BIN ?[id number] ?  ?PCN ?[id number] ?  ?PC GROUP ?[id number] ?  ?HELP DESK ?[phone] ?  ?PROVIDER ?PDMI ?  ?PROCESSOR ?PDMI ?

## 2021-09-28 NOTE — Pre-Procedure Instructions (Addendum)
Surgical Instructions ? ? ? Your procedure is scheduled on Tuesday, March 21. ? Report to [facility] Main Entrance "A" at 5:30 A.M., then check in with the Admitting office. ? Call this number if you have problems the morning of surgery: ? [phone] ? ? If you have any questions prior to your surgery date call [phone]: Open Monday-Friday 8am-4pm ? ? ? Remember: ? Do not eat or drink after midnight the night before your surgery ? ? Take these medicines the morning of surgery with A SIP OF WATER:  ? ?dofetilide (TIKOSYN)  ?rosuvastatin (CRESTOR) ?sertraline (ZOLOFT)  ? ?acetaminophen (TYLENOL)  if needed ?cetirizine (ZYRTEC)  if needed ?oxymetazoline (AFRIN) 0.05 % nasal spray if needed ?traMADol Veatrice Bourbon) if needed ? ?Follow your surgeon's instructions regarding taking Diphenhydramine (Benadryl) and Prednisone (Deltasone) prior to your surgery.  ? ?Per your cardiologist, stop Eliquis (Apixaban) 2 days prior to surgery. Your last dose should be Saturday March 18. ? ?As of today, STOP taking any Aspirin (unless otherwise instructed by your surgeon) Aleve, Naproxen, Ibuprofen, Motrin, Advil, Goody's, BC's, all herbal medications, fish oil, and all vitamins. ? ?         ?Do not wear jewelry or makeup ?Do not wear lotions, powders, perfumes/colognes, or deodorant. ?Do not shave 48 hours prior to surgery.  Men may shave face and neck. ?Do not bring valuables to the hospital. ?Do not wear nail polish, gel polish, artificial nails, or any other type of covering on natural nails (fingers and toes) ?If you have artificial nails or gel coating that need to be removed by a nail salon, please have this removed prior to surgery. Artificial nails or gel coating may interfere with anesthesia's ability to adequately monitor your vital signs. ? ?[facility] is not responsible for any belongings or valuables. .  ? ?Do NOT Smoke (Tobacco/Vaping)  24 hours prior to your procedure ? ?If you use a CPAP at night, you may bring your  mask for your overnight stay. ?  ?Contacts, glasses, hearing aids, dentures or partials may not be worn into surgery, please bring cases for these belongings ?  ?For patients admitted to the hospital, discharge time will be determined by your treatment team. ?  ?Patients discharged the day of surgery will not be allowed to drive home, and someone needs to stay with them for 24 hours. ? ?NO VISITORS WILL BE ALLOWED IN PRE-OP WHERE PATIENTS ARE PREPPED FOR SURGERY.  ONLY 1 SUPPORT PERSON MAY BE PRESENT IN THE WAITING ROOM WHILE YOU ARE IN SURGERY.  IF YOU ARE TO BE ADMITTED, ONCE YOU ARE IN YOUR ROOM YOU WILL BE ALLOWED TWO (2) VISITORS. 1 (ONE) VISITOR MAY STAY OVERNIGHT BUT MUST ARRIVE TO THE ROOM BY 8pm.  Minor children may have two parents present. Special consideration for safety and communication needs will be reviewed on a case by case basis. ? ?Special instructions:   ? ?Oral Hygiene is also important to reduce your risk of infection.  Remember - BRUSH YOUR TEETH THE MORNING OF SURGERY WITH YOUR REGULAR TOOTHPASTE ? ? ?- Preparing For Surgery ? ?Before surgery, you can play an important role. Because skin is not sterile, your skin needs to be as free of germs as possible. You can reduce the number of germs on your skin by washing with CHG (chlorahexidine gluconate) Soap before surgery.  CHG is an antiseptic cleaner which kills germs and bonds with the skin to continue killing germs even after washing.   ? ? ?  Please do not use if you have an allergy to CHG or antibacterial soaps. If your skin becomes reddened/irritated stop using the CHG.  ?Do not shave (including legs and underarms) for at least 48 hours prior to first CHG shower. It is OK to shave your face. ? ?Please follow these instructions carefully. ?  ? ? Shower the NIGHT BEFORE SURGERY and the MORNING OF SURGERY with CHG Soap.  ? If you chose to wash your hair, wash your hair first as usual with your normal shampoo. After you shampoo, rinse  your hair and body thoroughly to remove the shampoo.  Then [employer] and genitals (private parts) with your normal soap and rinse thoroughly to remove soap. ? ?After that Use CHG Soap as you would any other liquid soap. You can apply CHG directly to the skin and wash gently with a scrungie or a clean washcloth.  ? ?Apply the CHG Soap to your body ONLY FROM THE NECK DOWN.  Do not use on open wounds or open sores. Avoid contact with your eyes, ears, mouth and genitals (private parts). Wash Face and genitals (private parts)  with your normal soap.  ? ?Wash thoroughly, paying special attention to the area where your surgery will be performed. ? ?Thoroughly rinse your body with warm water from the neck down. ? ?DO NOT shower/wash with your normal soap after using and rinsing off the CHG Soap. ? ?Pat yourself dry with a CLEAN TOWEL. ? ?Wear CLEAN PAJAMAS to bed the night before surgery ? ?Place CLEAN SHEETS on your bed the night before your surgery ? ?DO NOT SLEEP WITH PETS. ? ? ?Day of Surgery: ? ?Take a shower with CHG soap. ?Wear Clean/Comfortable clothing the morning of surgery ?Do not apply any deodorants/lotions.   ?Remember to brush your teeth WITH YOUR REGULAR TOOTHPASTE. ? ? ? ?COVID testing ? ?If you are going to stay overnight or be admitted after your procedure/surgery and require a pre-op COVID test, please follow these instructions after your COVID test  ? ?You are not required to quarantine however you are required to wear a well-fitting mask when you are out and around people not in your household.  If your mask becomes wet or soiled, replace with a new one. ? ?Wash your hands often with soap and water for 20 seconds or clean your hands with an alcohol-based hand sanitizer that contains at least 60% alcohol. ? ?Do not share personal items. ? ?Notify your provider: ?if you are in close contact with someone who has COVID  ?or if you develop a fever of 100.4 or greater, sneezing, cough, sore throat, shortness  of breath or body aches. ? ?  ?Please read over the following fact sheets that you were given.  ? ?

## 2021-09-29 ENCOUNTER — Other Ambulatory Visit (INDEPENDENT_AMBULATORY_CARE_PROVIDER_SITE_OTHER): Payer: Medicare Other

## 2021-09-29 ENCOUNTER — Encounter (HOSPITAL_COMMUNITY)
Admission: RE | Admit: 2021-09-29 | Discharge: 2021-09-29 | Disposition: A | Discharge status: Home / Self Care | Payer: Medicare Other | Source: Ambulatory Visit | Attending: Vascular Surgery | Admitting: Vascular Surgery

## 2021-09-29 ENCOUNTER — Telehealth: Payer: Self-pay | Admitting: *Deleted

## 2021-09-29 ENCOUNTER — Encounter (HOSPITAL_COMMUNITY): Payer: Self-pay

## 2021-09-29 ENCOUNTER — Other Ambulatory Visit: Payer: Medicare Other

## 2021-09-29 ENCOUNTER — Other Ambulatory Visit: Payer: Self-pay

## 2021-09-29 VITALS — BP 152/61 | HR 50 | Temp 97.7°F | Resp 18 | Ht 63.0 in | Wt 144.5 lb

## 2021-09-29 DIAGNOSIS — Z01818 Encounter for other preprocedural examination: Secondary | ICD-10-CM

## 2021-09-29 DIAGNOSIS — K5790 Diverticulosis of intestine, part unspecified, without perforation or abscess without bleeding: Secondary | ICD-10-CM | POA: Insufficient documentation

## 2021-09-29 DIAGNOSIS — Z7901 Long term (current) use of anticoagulants: Secondary | ICD-10-CM | POA: Diagnosis not present

## 2021-09-29 DIAGNOSIS — I10 Essential (primary) hypertension: Secondary | ICD-10-CM | POA: Insufficient documentation

## 2021-09-29 DIAGNOSIS — I428 Other cardiomyopathies: Secondary | ICD-10-CM | POA: Diagnosis not present

## 2021-09-29 DIAGNOSIS — Z01812 Encounter for preprocedural laboratory examination: Secondary | ICD-10-CM | POA: Diagnosis not present

## 2021-09-29 DIAGNOSIS — E875 Hyperkalemia: Secondary | ICD-10-CM | POA: Diagnosis not present

## 2021-09-29 DIAGNOSIS — I714 Abdominal aortic aneurysm, without rupture, unspecified: Secondary | ICD-10-CM | POA: Insufficient documentation

## 2021-09-29 DIAGNOSIS — Z5181 Encounter for therapeutic drug level monitoring: Secondary | ICD-10-CM | POA: Diagnosis not present

## 2021-09-29 DIAGNOSIS — E785 Hyperlipidemia, unspecified: Secondary | ICD-10-CM | POA: Diagnosis not present

## 2021-09-29 DIAGNOSIS — Z79899 Other long term (current) drug therapy: Secondary | ICD-10-CM | POA: Diagnosis not present

## 2021-09-29 HISTORY — DX: Acute embolism and thrombosis of unspecified deep veins of unspecified lower extremity: I82.409

## 2021-09-29 HISTORY — DX: Cardiac arrhythmia, unspecified: I49.9

## 2021-09-29 LAB — COMPREHENSIVE METABOLIC PANEL
ALT: 13 U/L (ref 0–44)
AST: 23 U/L (ref 15–41)
Albumin: 4.6 g/dL (ref 3.5–5.0)
Alkaline Phosphatase: 36 U/L — ABNORMAL LOW (ref 38–126)
Anion gap: 10 (ref 5–15)
BUN: 27 mg/dL — ABNORMAL HIGH (ref 8–23)
CO2: 25 mmol/L (ref 22–32)
Calcium: 9.3 mg/dL (ref 8.9–10.3)
Chloride: 105 mmol/L (ref 98–111)
Creatinine, Ser: 1.2 mg/dL — ABNORMAL HIGH (ref 0.44–1.00)
GFR, Estimated: 48 mL/min — ABNORMAL LOW (ref >60)
Glucose, Bld: 87 mg/dL (ref 70–99)
Potassium: 3.6 mmol/L (ref 3.5–5.1)
Sodium: 140 mmol/L (ref 135–145)
Total Bilirubin: 0.5 mg/dL (ref 0.3–1.2)
Total Protein: 7.7 g/dL (ref 6.5–8.1)

## 2021-09-29 LAB — URINALYSIS, ROUTINE W REFLEX MICROSCOPIC
Bilirubin Urine: NEGATIVE
Glucose, UA: NEGATIVE mg/dL
Hgb urine dipstick: NEGATIVE
Ketones, ur: NEGATIVE mg/dL
Nitrite: NEGATIVE
Protein, ur: NEGATIVE mg/dL
Specific Gravity, Urine: 1.012 (ref 1.005–1.030)
pH: 5 (ref 5.0–8.0)

## 2021-09-29 LAB — CBC
HCT: 34.3 % — ABNORMAL LOW (ref 36.0–46.0)
Hemoglobin: 11 g/dL — ABNORMAL LOW (ref 12.0–15.0)
MCH: 29.6 pg (ref 26.0–34.0)
MCHC: 32.1 g/dL (ref 30.0–36.0)
MCV: 92.5 fL (ref 80.0–100.0)
Platelets: 177 10*3/uL (ref 150–400)
RBC: 3.71 MIL/uL — ABNORMAL LOW (ref 3.87–5.11)
RDW: 12.9 % (ref 11.5–15.5)
WBC: 4.5 10*3/uL (ref 4.0–10.5)
nRBC: 0 % (ref 0.0–0.2)

## 2021-09-29 LAB — PROTIME-INR
INR: 1.4 — ABNORMAL HIGH (ref 0.8–1.2)
Prothrombin Time: 17 seconds — ABNORMAL HIGH (ref 11.4–15.2)

## 2021-09-29 LAB — TYPE AND SCREEN
ABO/RH(D): A POS
Antibody Screen: NEGATIVE

## 2021-09-29 LAB — SURGICAL PCR SCREEN
MRSA, PCR: NEGATIVE
Staphylococcus aureus: POSITIVE — AB

## 2021-09-29 LAB — APTT: aPTT: 36 seconds (ref 24–36)

## 2021-09-29 NOTE — Progress Notes (Addendum)
PCP - Ria Bush, [place] ?Cardiologist - Dr. Saunders Revel in [place] ? ?Chest x-ray - Not indicated ?EKG - 07/26/21 ?Stress Test - 06/12/17 ?ECHO - 09/20/20 ?Cardiac Cath -Denies  ? ?Sleep Study - Denies ? ?Denies ? ?Blood Thinner Instructions: Per patient instructed by Dr. Saunders Revel to stop Eliquis two days prior to surgery ? ?Anesthesia review: yes cardiac history ? ?Patient denies shortness of breath, fever, cough and chest pain at PAT appointment ? ? ?All instructions explained to the patient, with a verbal understanding of the material. Patient agrees to go over the instructions while at home for a better understanding. The opportunity to ask questions was provided. ? ? ?Addendum 1413 ?Called Kya at [facility] office to make her aware of UA having rare bacteria and small leukocytes. ? ?

## 2021-09-29 NOTE — Telephone Encounter (Signed)
Pt dropped off patient assistance application for Eliquis today at her lab visit.  ?Pt completed her portion.  ?Called and notified pt will still need her to bring her proof of income and copy of oop expense report from pharmacy for this year. Pt states she will try to bring these next week.  ? ?Paperwork with this RN at this time.  ?

## 2021-09-30 LAB — BASIC METABOLIC PANEL
BUN/Creatinine Ratio: 26 (ref 12–28)
BUN: 32 mg/dL — ABNORMAL HIGH (ref 8–27)
CO2: 20 mmol/L (ref 20–29)
Calcium: 9.7 mg/dL (ref 8.7–10.3)
Chloride: 104 mmol/L (ref 96–106)
Creatinine, Ser: 1.21 mg/dL — ABNORMAL HIGH (ref 0.57–1.00)
Glucose: 105 mg/dL — ABNORMAL HIGH (ref 70–99)
Potassium: 4 mmol/L (ref 3.5–5.2)
Sodium: 144 mmol/L (ref 134–144)
eGFR: 47 mL/min/{1.73_m2} — ABNORMAL LOW (ref >59)

## 2021-10-02 ENCOUNTER — Encounter (HOSPITAL_COMMUNITY): Payer: Self-pay

## 2021-10-02 NOTE — Progress Notes (Signed)
Anesthesia Chart Review: ? Case: [id number] Date/Time: 10/03/21 0715  ? Procedure: ABDOMINAL AORTIC ENDOVASCULAR STENT GRAFT  ? Anesthesia type: General  ? Pre-op diagnosis: AAA  ? Location: [facility] ROOM 16 / [facility]  ? Surgeons: Waynetta Sandy, MD  ? ?  ? ? ?DISCUSSION: Patient is a 76 year old female scheduled for the above procedure. ? ?History includes former smoker (quit 07/16/88), HTN, HLD, carotid artery stenosis, non-ischemic cardiomyopathy (04/2017, "MPI without perfusion defects LVEF as low as 35-40% but normalized on repeat echo in 04/2018"), afib, AAA, GERD, diverticulitis, CKD, DVT (RLE 05/27/15).  ? ?Preoperative cardiology input outlined by Richardson Dopp, PA-C on 09/12/21: ?"Reviewed case as well with Dr. Saunders Revel.  ?  ?Recommendations: ?Based on ACC/AHA guidelines, the patient is at acceptable risk for the planned procedure and may proceed without further cardiovascular testing.  ?The patient can hold Apixaban (Eliquis) for 3 days prior to the procedure and resume as soon as possible post op when felt to be safe.   ?Interruption of dofetilide should be avoided if possible; if she misses more than 3 consecutive doses, monitored reinitiation will need to be coordinated with EP.   ?QT-prolonging medications should be avoided." ?  ?She actually had routine follow-up with Dr. Saunders Revel on 09/22/21. She was maintaining SR without appropriate QT interval.  Losartan increased for hypertension and felt follow-up BMET could be done during her admission for AAA repair.  He reiterated clearance recommendations. ? ?Per posting and by patient's report, she she was instructed to hold Eliquis 2 days prior to surgery. ? ?Anesthesia team to evaluate on the day of surgery. ? ? ?VS: BP (!) 152/61   Pulse (!) 50   Temp 36.5 ?C (Oral)   Resp 18   Ht '5\' 3"'$  (1.6 m)   Wt 65.5 kg   SpO2 100%   BMI 25.60 kg/m?  ? ? ?PROVIDERS: ?Ria Bush, MD is PCP  ?Lars Mage, MD is EP. Last visit 07/26/21 with Tommye Standard, PA-C.   Continue Tikosyn and Eliquis for A-fib.  She had asymptomatic bradycardia.  34-monthfollow-up planned. ?ENelva Bush MD is cardiologist. Last visit 09/22/21.  ? ? ?LABS: PAT RN notified [facility] RN of UA results. Labs reviewed: Acceptable for surgery. ?(all labs ordered are listed, but only abnormal results are displayed) ? ?Labs Reviewed  ?SURGICAL PCR SCREEN - Abnormal; Notable for the following components:  ?    Result Value  ? Staphylococcus aureus POSITIVE (*)   ? All other components within normal limits  ?CBC - Abnormal; Notable for the following components:  ? RBC 3.71 (*)   ? Hemoglobin 11.0 (*)   ? HCT 34.3 (*)   ? All other components within normal limits  ?COMPREHENSIVE METABOLIC PANEL - Abnormal; Notable for the following components:  ? BUN 27 (*)   ? Creatinine, Ser 1.20 (*)   ? Alkaline Phosphatase 36 (*)   ? GFR, Estimated 48 (*)   ? All other components within normal limits  ?PROTIME-INR - Abnormal; Notable for the following components:  ? Prothrombin Time 17.0 (*)   ? INR 1.4 (*)   ? All other components within normal limits  ?URINALYSIS, ROUTINE W REFLEX MICROSCOPIC - Abnormal; Notable for the following components:  ? Leukocytes,Ua SMALL (*)   ? Bacteria, UA RARE (*)   ? All other components within normal limits  ?APTT  ?TYPE AND SCREEN  ? ? ? ?IMAGES: ?CTA Chest/abd/pelvis 08/19/21: ?IMPRESSION: ?VASCULAR ?1. Fusiform infrarenal abdominal aortic aneurysm measuring  up to 4.7 ?cm. Recommend follow-up CT/MR every 6 months and vascular ?consultation if not already obtained . This recommendation follows ?ACR consensus guidelines: White Paper of the ACR Incidental Findings ?Committee II on Vascular Findings. J Am Coll Radiol 2013; ?[id number]. ?2.  Aortic Atherosclerosis (ICD10-I70.0). ?- Per 08/30/21 note by Dr. Donzetta Matters, "We reviewed her CT angio together and I have measured the aneurysm up to 5.2 cm in transverse diameter."  ? ?NON-VASCULAR ?1. Colonic diverticulosis. ?2. Mild diffuse cortical thinning of  the kidneys bilaterally. ?3. Multilevel degenerative changes of the lumbar spine. ?  ? ?EKG: 09/22/21 ([facility]): Sinus bradycardia at 49 bpm.  First-degree AV block.  Nonspecific T wave abnormality. ? ? ?CV: ?Echo 09/20/20: ?IMPRESSIONS  ? 1. Left ventricular ejection fraction, by estimation, is 60 to 65%. Left  ?ventricular ejection fraction by 3D volume is 61 %. The left ventricle has  ?normal function. The left ventricle has no regional wall motion  ?abnormalities. Left ventricular diastolic  ? parameters are consistent with Grade I diastolic dysfunction (impaired  ?relaxation).  ? 2. Right ventricular systolic function is normal. The right ventricular  ?size is normal. There is normal pulmonary artery systolic pressure. The  ?estimated right ventricular systolic pressure is 48.1 mmHg.  ? 3. Left atrial size was severely dilated.  ? 4. Right atrial size was mild to moderately dilated.  ? 5. The mitral valve is grossly normal. Trivial mitral valve  ?regurgitation. No evidence of mitral stenosis.  ? 6. The aortic valve is tricuspid. Aortic valve regurgitation is not  ?visualized. No aortic stenosis is present.  ? 7. The inferior vena cava is normal in size with greater than 50%  ?respiratory variability, suggesting right atrial pressure of 3 mmHg.  ?- Comparison(s): Changes from prior study are noted. EF has improved to  ?60-65% [up from 40-45% 04/14/20]. NSR has replaced Afib.  ? ? ?US Carotid 08/15/21: ?Summary:  ?- Right Carotid: Velocities in the right ICA are consistent with a 40-59% stenosis. Non-hemodynamically significant plaque <50% noted in the CCA. The ECA appears >50% stenosed. Stenosis based on peak systolic velocities.  ?- Left Carotid: Velocities in the left ICA are consistent with a 40-59% stenosis. Hemodynamically significant plaque >50% visualized in the CCA. Stenosis based on peak systolic velocities.  ?- Vertebrals:  Bilateral vertebral arteries demonstrate antegrade flow.  ?- Subclavians:  Bilateral subclavian artery flow was disturbed.  ? ? ?[place] AAA 08/15/21: ?Summary:  ?- Abdominal Aorta: There is evidence of abnormal dilatation of the mid and  ?distal Abdominal aorta. The largest aortic measurement is 5.1 cm.  ?Stenosis:  ?Widely patent bilateral common and external iliac arteries without  ?evidence of stenosis.  ?- IVC/Iliac: Patent IVC.  ? ? ?Nuclear stress test 06/12/17 (LVEF 35-40% by 04/29/17 echo): ?Exercise myocardial perfusion imaging study with no significant  ischemia ?Normal wall motion, EF estimated at 56% ?No EKG changes concerning for ischemia at peak stress or in recovery. ?Target heart rate achieved.  ?Hypertensive with exercise ?Low risk scan ? ? ?Past Medical History:  ?Diagnosis Date  ? Allergy   ? Anxiety   ? Arthritis   ? Carotid stenosis   ? a. 02/2016 Carotid U/S: Bilateral 40-59%; b. 03/2017 Carotid U/S: RICA 85-63%, LICA 1-49%; c. 70/2637 Carotid U/S: bilat 40-59% ICA dzs.  ? Cervical spondylosis   ? s/p spine injections  ? CKD (chronic kidney disease), stage III ([place])   ? Depression   ? DVT (deep venous thrombosis) ([place])   ? RLE DVT  05/27/15  ? Dysrhythmia   ? afib  ? Ectatic abdominal aorta ([place]) 2016  ? rpt [place] 5 yrs  ? GERD (gastroesophageal reflux disease)   ? History of chicken pox   ? History of diverticulitis of colon   ? HLD (hyperlipidemia)   ? HTN (hypertension)   ? Irritable bowel syndrome with constipation   ? Lichen sclerosus et atrophicus   ? NICM (nonischemic cardiomyopathy) ([place])   ? a. 04/2017 Echo: EF 35-40%, diff HK; b. 05/2017 MV: EF 56%, no ischemia/infarct; c. 04/2018 Echo: EF 55-60%, no rwma, Gr2 DD; d. 03/2020 Echo: EF 40-45%, nl PASP, mod dil LA, mild to mod dil RA, Triv MR.  ? Obesity   ? Persistent atrial fibrillation ([place])   ? a. Dx 03/2020. CHA2DS2VASc = 5-->xarelto '15mg'$  daily.  ? Premature atrial contraction   ? a. 04/2017 48hr Holter: Predominant rhythm - sinus. Rare PVC's, freq PAC's with freq atrial runs up to 34 beats-->metoprolol started.  ?  Seasonal allergic rhinitis   ? ? ?Past Surgical History:  ?Procedure Laterality Date  ? ABDOMINAL HYSTERECTOMY    ? bladder tack  1990s  ? BREAST BIOPSY Right 03/05/2012  ?  benign  ? CARDIOVERSION N/A 05/17/2020  ?

## 2021-10-02 NOTE — Anesthesia Preprocedure Evaluation (Addendum)
Anesthesia Evaluation  ?Patient identified by MRN, date of birth, ID band ?Patient awake ? ? ? ?Reviewed: ?Allergy & Precautions, H&P , NPO status , Patient's Chart, lab work & pertinent test results ? ?Airway ?Mallampati: II ? ?TM Distance: >3 FB ?Neck ROM: Full ? ? ? Dental ?no notable dental hx. ?(+) Teeth Intact, Dental Advisory Given ?  ?Pulmonary ?neg pulmonary ROS, former smoker,  ?  ?Pulmonary exam normal ?breath sounds clear to auscultation ? ? ? ? ? ? Cardiovascular ?hypertension, Pt. on medications ?+ dysrhythmias Atrial Fibrillation  ?Rhythm:Regular Rate:Normal ? ? ?  ?Neuro/Psych ?Anxiety Depression negative neurological ROS ?   ? GI/Hepatic ?Neg liver ROS, GERD  Medicated,  ?Endo/Other  ?negative endocrine ROS ? Renal/GU ?Renal InsufficiencyRenal disease  ?negative genitourinary ?  ?Musculoskeletal ? ?(+) Arthritis , Osteoarthritis,   ? Abdominal ?  ?Peds ? Hematology ? ?(+) Blood dyscrasia, anemia ,   ?Anesthesia Other Findings ? ? Reproductive/Obstetrics ?negative OB ROS ? ?  ? ? ? ? ? ? ? ? ? ? ? ? ? ?  ?  ? ? ? ? ? ? ?Anesthesia Physical ?Anesthesia Plan ? ?ASA: 3 ? ?Anesthesia Plan: General  ? ?Post-op Pain Management: Tylenol PO (pre-op)*  ? ?Induction: Intravenous ? ?PONV Risk Score and Plan: 4 or greater and Ondansetron, Dexamethasone and Treatment may vary due to age or medical condition ? ?Airway Management Planned: Oral ETT ? ?Additional Equipment: Arterial line ? ?Intra-op Plan:  ? ?Post-operative Plan: Extubation in OR and Possible Post-op intubation/ventilation ? ?Informed Consent: I have reviewed the patients History and Physical, chart, labs and discussed the procedure including the risks, benefits and alternatives for the proposed anesthesia with the patient or authorized representative who has indicated his/her understanding and acceptance.  ? ? ? ?Dental advisory given ? ?Plan Discussed with: CRNA ? ?Anesthesia Plan Comments: (PAT note written  10/02/2021 by Myra Gianotti, PA-C. ?)  ? ? ? ? ? ?Anesthesia Quick Evaluation ? ?

## 2021-10-03 ENCOUNTER — Inpatient Hospital Stay (HOSPITAL_COMMUNITY): Payer: Medicare Other

## 2021-10-03 ENCOUNTER — Other Ambulatory Visit: Payer: Self-pay

## 2021-10-03 ENCOUNTER — Inpatient Hospital Stay (HOSPITAL_COMMUNITY)
Admission: RE | Admit: 2021-10-03 | Discharge: 2021-10-04 | DRG: 269 | Disposition: A | Discharge status: Home / Self Care | Payer: Medicare Other | Attending: Vascular Surgery | Admitting: Vascular Surgery

## 2021-10-03 ENCOUNTER — Inpatient Hospital Stay (HOSPITAL_COMMUNITY): Payer: Medicare Other | Admitting: Vascular Surgery

## 2021-10-03 ENCOUNTER — Inpatient Hospital Stay (HOSPITAL_COMMUNITY): Payer: Medicare Other | Admitting: Anesthesiology

## 2021-10-03 ENCOUNTER — Telehealth: Payer: Self-pay | Admitting: *Deleted

## 2021-10-03 ENCOUNTER — Encounter (HOSPITAL_COMMUNITY): Payer: Self-pay | Admitting: Vascular Surgery

## 2021-10-03 ENCOUNTER — Encounter (HOSPITAL_COMMUNITY)
Admission: RE | Disposition: A | Discharge status: Home / Self Care | Payer: Self-pay | Source: Home / Self Care | Attending: Vascular Surgery

## 2021-10-03 DIAGNOSIS — R9431 Abnormal electrocardiogram [ECG] [EKG]: Secondary | ICD-10-CM | POA: Diagnosis not present

## 2021-10-03 DIAGNOSIS — I714 Abdominal aortic aneurysm, without rupture, unspecified: Principal | ICD-10-CM | POA: Diagnosis present

## 2021-10-03 DIAGNOSIS — F419 Anxiety disorder, unspecified: Secondary | ICD-10-CM | POA: Diagnosis present

## 2021-10-03 DIAGNOSIS — I6523 Occlusion and stenosis of bilateral carotid arteries: Secondary | ICD-10-CM | POA: Diagnosis present

## 2021-10-03 DIAGNOSIS — E785 Hyperlipidemia, unspecified: Secondary | ICD-10-CM | POA: Diagnosis present

## 2021-10-03 DIAGNOSIS — Z833 Family history of diabetes mellitus: Secondary | ICD-10-CM

## 2021-10-03 DIAGNOSIS — I4891 Unspecified atrial fibrillation: Secondary | ICD-10-CM | POA: Diagnosis not present

## 2021-10-03 DIAGNOSIS — I428 Other cardiomyopathies: Secondary | ICD-10-CM | POA: Diagnosis present

## 2021-10-03 DIAGNOSIS — Z79899 Other long term (current) drug therapy: Secondary | ICD-10-CM | POA: Diagnosis not present

## 2021-10-03 DIAGNOSIS — J309 Allergic rhinitis, unspecified: Secondary | ICD-10-CM | POA: Diagnosis present

## 2021-10-03 DIAGNOSIS — Z83438 Family history of other disorder of lipoprotein metabolism and other lipidemia: Secondary | ICD-10-CM

## 2021-10-03 DIAGNOSIS — Z882 Allergy status to sulfonamides status: Secondary | ICD-10-CM | POA: Diagnosis not present

## 2021-10-03 DIAGNOSIS — Z87891 Personal history of nicotine dependence: Secondary | ICD-10-CM | POA: Diagnosis not present

## 2021-10-03 DIAGNOSIS — Z9049 Acquired absence of other specified parts of digestive tract: Secondary | ICD-10-CM

## 2021-10-03 DIAGNOSIS — I440 Atrioventricular block, first degree: Secondary | ICD-10-CM | POA: Diagnosis not present

## 2021-10-03 DIAGNOSIS — D649 Anemia, unspecified: Secondary | ICD-10-CM | POA: Diagnosis not present

## 2021-10-03 DIAGNOSIS — Z8349 Family history of other endocrine, nutritional and metabolic diseases: Secondary | ICD-10-CM

## 2021-10-03 DIAGNOSIS — Z888 Allergy status to other drugs, medicaments and biological substances status: Secondary | ICD-10-CM

## 2021-10-03 DIAGNOSIS — I517 Cardiomegaly: Secondary | ICD-10-CM | POA: Diagnosis not present

## 2021-10-03 DIAGNOSIS — Z91041 Radiographic dye allergy status: Secondary | ICD-10-CM | POA: Diagnosis not present

## 2021-10-03 DIAGNOSIS — I10 Essential (primary) hypertension: Secondary | ICD-10-CM

## 2021-10-03 DIAGNOSIS — Z7901 Long term (current) use of anticoagulants: Secondary | ICD-10-CM | POA: Diagnosis not present

## 2021-10-03 DIAGNOSIS — Z8249 Family history of ischemic heart disease and other diseases of the circulatory system: Secondary | ICD-10-CM

## 2021-10-03 DIAGNOSIS — Z820 Family history of epilepsy and other diseases of the nervous system: Secondary | ICD-10-CM

## 2021-10-03 DIAGNOSIS — I4819 Other persistent atrial fibrillation: Secondary | ICD-10-CM | POA: Diagnosis not present

## 2021-10-03 DIAGNOSIS — Z0389 Encounter for observation for other suspected diseases and conditions ruled out: Secondary | ICD-10-CM | POA: Diagnosis not present

## 2021-10-03 DIAGNOSIS — K219 Gastro-esophageal reflux disease without esophagitis: Secondary | ICD-10-CM | POA: Diagnosis not present

## 2021-10-03 HISTORY — PX: ULTRASOUND GUIDANCE FOR VASCULAR ACCESS: SHX6516

## 2021-10-03 HISTORY — PX: ABDOMINAL AORTIC ENDOVASCULAR STENT GRAFT: SHX5707

## 2021-10-03 LAB — CBC
HCT: 24.3 % — ABNORMAL LOW (ref 36.0–46.0)
Hemoglobin: 7.8 g/dL — ABNORMAL LOW (ref 12.0–15.0)
MCH: 29.3 pg (ref 26.0–34.0)
MCHC: 32.1 g/dL (ref 30.0–36.0)
MCV: 91.4 fL (ref 80.0–100.0)
Platelets: UNDETERMINED 10*3/uL (ref 150–400)
RBC: 2.66 MIL/uL — ABNORMAL LOW (ref 3.87–5.11)
RDW: 13.3 % (ref 11.5–15.5)
WBC: 5.9 10*3/uL (ref 4.0–10.5)
nRBC: 0 % (ref 0.0–0.2)

## 2021-10-03 LAB — BASIC METABOLIC PANEL
Anion gap: 9 (ref 5–15)
Anion gap: 8 (ref 5–15)
BUN: 24 mg/dL — ABNORMAL HIGH (ref 8–23)
BUN: 22 mg/dL (ref 8–23)
CO2: 23 mmol/L (ref 22–32)
CO2: 24 mmol/L (ref 22–32)
Calcium: 8.8 mg/dL — ABNORMAL LOW (ref 8.9–10.3)
Calcium: 8.4 mg/dL — ABNORMAL LOW (ref 8.9–10.3)
Chloride: 106 mmol/L (ref 98–111)
Chloride: 106 mmol/L (ref 98–111)
Creatinine, Ser: 1.2 mg/dL — ABNORMAL HIGH (ref 0.44–1.00)
Creatinine, Ser: 1.26 mg/dL — ABNORMAL HIGH (ref 0.44–1.00)
GFR, Estimated: 48 mL/min — ABNORMAL LOW (ref >60)
GFR, Estimated: 45 mL/min — ABNORMAL LOW (ref >60)
Glucose, Bld: 125 mg/dL — ABNORMAL HIGH (ref 70–99)
Glucose, Bld: 104 mg/dL — ABNORMAL HIGH (ref 70–99)
Potassium: 4 mmol/L (ref 3.5–5.1)
Potassium: 4.4 mmol/L (ref 3.5–5.1)
Sodium: 138 mmol/L (ref 135–145)
Sodium: 138 mmol/L (ref 135–145)

## 2021-10-03 LAB — POCT ACTIVATED CLOTTING TIME
Activated Clotting Time: 197 seconds
Activated Clotting Time: 227 seconds

## 2021-10-03 LAB — ABO/RH: ABO/RH(D): A POS

## 2021-10-03 SURGERY — INSERTION, ENDOVASCULAR STENT GRAFT, AORTA, ABDOMINAL
Anesthesia: General | Site: Groin

## 2021-10-03 MED ORDER — ONDANSETRON HCL 4 MG/2ML IJ SOLN: 4.0000 mg | Freq: Four times a day (QID) | INTRAMUSCULAR | Status: DC | PRN

## 2021-10-03 MED ORDER — CHLORHEXIDINE GLUCONATE CLOTH 2 % EX PADS: 6.0000 | MEDICATED_PAD | Freq: Once | CUTANEOUS | Status: DC

## 2021-10-03 MED ORDER — POLYETHYLENE GLYCOL 3350 17 G PO PACK: 17.0000 g | PACK | Freq: Every day | ORAL | Status: DC | PRN

## 2021-10-03 MED ORDER — ACETAMINOPHEN 500 MG PO TABS
ORAL_TABLET | ORAL | Status: AC
  Administered 2021-10-03: 1000 mg via ORAL
  Filled 2021-10-03: qty 2

## 2021-10-03 MED ORDER — FENTANYL CITRATE (PF) 100 MCG/2ML IJ SOLN: 25.0000 ug | INTRAMUSCULAR | Status: DC | PRN

## 2021-10-03 MED ORDER — ROCURONIUM BROMIDE 10 MG/ML (PF) SYRINGE
PREFILLED_SYRINGE | INTRAVENOUS | Status: DC | PRN
  Administered 2021-10-03: 60 mg via INTRAVENOUS

## 2021-10-03 MED ORDER — PHENOL 1.4 % MT LIQD
1.0000 | OROMUCOSAL | Status: DC | PRN
  Administered 2021-10-03: 1 via OROMUCOSAL
  Filled 2021-10-03: qty 177

## 2021-10-03 MED ORDER — GABAPENTIN 300 MG PO CAPS
300.0000 mg | ORAL_CAPSULE | Freq: Every day | ORAL | Status: DC
  Administered 2021-10-03: 300 mg via ORAL
  Filled 2021-10-03: qty 1

## 2021-10-03 MED ORDER — TRAMADOL HCL 50 MG PO TABS
50.0000 mg | ORAL_TABLET | Freq: Three times a day (TID) | ORAL | Status: DC | PRN
  Administered 2021-10-03: 50 mg via ORAL
  Filled 2021-10-03: qty 1

## 2021-10-03 MED ORDER — PROPOFOL 10 MG/ML IV BOLUS
INTRAVENOUS | Status: AC
  Filled 2021-10-03: qty 20

## 2021-10-03 MED ORDER — CEFAZOLIN SODIUM-DEXTROSE 2-4 GM/100ML-% IV SOLN
INTRAVENOUS | Status: AC
  Filled 2021-10-03: qty 100

## 2021-10-03 MED ORDER — FENTANYL CITRATE (PF) 250 MCG/5ML IJ SOLN
INTRAMUSCULAR | Status: AC
  Filled 2021-10-03: qty 5

## 2021-10-03 MED ORDER — ONDANSETRON HCL 4 MG/2ML IJ SOLN
INTRAMUSCULAR | Status: DC | PRN
  Administered 2021-10-03: 4 mg via INTRAVENOUS

## 2021-10-03 MED ORDER — OXYMETAZOLINE HCL 0.05 % NA SOLN
1.0000 | Freq: Two times a day (BID) | NASAL | Status: DC | PRN
  Filled 2021-10-03: qty 30

## 2021-10-03 MED ORDER — HEPARIN 6000 UNIT IRRIGATION SOLUTION
Status: AC
  Filled 2021-10-03: qty 500

## 2021-10-03 MED ORDER — SODIUM CHLORIDE 0.9 % IV SOLN: 500.0000 mL | Freq: Once | INTRAVENOUS | Status: DC | PRN

## 2021-10-03 MED ORDER — SODIUM CHLORIDE 0.9 % IV SOLN: INTRAVENOUS | Status: DC

## 2021-10-03 MED ORDER — HYDROCORTISONE SOD SUC (PF) 100 MG IJ SOLR
INTRAMUSCULAR | Status: DC | PRN
  Administered 2021-10-03: 100 mg via INTRAVENOUS

## 2021-10-03 MED ORDER — GUAIFENESIN-DM 100-10 MG/5ML PO SYRP: 15.0000 mL | ORAL_SOLUTION | ORAL | Status: DC | PRN

## 2021-10-03 MED ORDER — MORPHINE SULFATE (PF) 2 MG/ML IV SOLN
2.0000 mg | INTRAVENOUS | Status: DC | PRN
  Administered 2021-10-03 (×2): 2 mg via INTRAVENOUS
  Filled 2021-10-03 (×2): qty 1

## 2021-10-03 MED ORDER — ORAL CARE MOUTH RINSE: 15.0000 mL | Freq: Once | OROMUCOSAL | Status: AC

## 2021-10-03 MED ORDER — SERTRALINE HCL 100 MG PO TABS
100.0000 mg | ORAL_TABLET | Freq: Every day | ORAL | Status: DC
  Filled 2021-10-03: qty 1

## 2021-10-03 MED ORDER — SUGAMMADEX SODIUM 200 MG/2ML IV SOLN
INTRAVENOUS | Status: DC | PRN
  Administered 2021-10-03: 200 mg via INTRAVENOUS

## 2021-10-03 MED ORDER — HYDRALAZINE HCL 20 MG/ML IJ SOLN
5.0000 mg | Freq: Once | INTRAMUSCULAR | Status: AC
  Administered 2021-10-03: 5 mg via INTRAVENOUS

## 2021-10-03 MED ORDER — PROPOFOL 10 MG/ML IV BOLUS
INTRAVENOUS | Status: DC | PRN
  Administered 2021-10-03: 100 mg via INTRAVENOUS

## 2021-10-03 MED ORDER — DOCUSATE SODIUM 100 MG PO CAPS
100.0000 mg | ORAL_CAPSULE | Freq: Every day | ORAL | Status: DC
  Administered 2021-10-04: 100 mg via ORAL
  Filled 2021-10-03: qty 1

## 2021-10-03 MED ORDER — POTASSIUM CHLORIDE CRYS ER 20 MEQ PO TBCR: 20.0000 meq | EXTENDED_RELEASE_TABLET | Freq: Every day | ORAL | Status: DC | PRN

## 2021-10-03 MED ORDER — HEPARIN 6000 UNIT IRRIGATION SOLUTION
Status: DC | PRN
  Administered 2021-10-03: 1

## 2021-10-03 MED ORDER — PANTOPRAZOLE SODIUM 40 MG PO TBEC
40.0000 mg | DELAYED_RELEASE_TABLET | Freq: Every day | ORAL | Status: DC
  Administered 2021-10-04: 40 mg via ORAL
  Filled 2021-10-03: qty 1

## 2021-10-03 MED ORDER — LACTATED RINGERS IV SOLN: INTRAVENOUS | Status: DC

## 2021-10-03 MED ORDER — CLONAZEPAM 0.5 MG PO TABS: 0.5000 mg | ORAL_TABLET | Freq: Every day | ORAL | Status: DC

## 2021-10-03 MED ORDER — ROCURONIUM BROMIDE 10 MG/ML (PF) SYRINGE
PREFILLED_SYRINGE | INTRAVENOUS | Status: AC
  Filled 2021-10-03: qty 10

## 2021-10-03 MED ORDER — DOFETILIDE 125 MCG PO CAPS
125.0000 ug | ORAL_CAPSULE | Freq: Two times a day (BID) | ORAL | Status: DC
  Administered 2021-10-04: 125 ug via ORAL
  Filled 2021-10-03 (×2): qty 1

## 2021-10-03 MED ORDER — CLONAZEPAM 0.25 MG PO TBDP
0.5000 mg | ORAL_TABLET | Freq: Every day | ORAL | Status: DC
  Administered 2021-10-03: 0.5 mg via ORAL
  Filled 2021-10-03: qty 2

## 2021-10-03 MED ORDER — GLYCOPYRROLATE 0.2 MG/ML IJ SOLN
INTRAMUSCULAR | Status: DC | PRN
Start: 2021-10-03 — End: 2021-10-03
  Administered 2021-10-03: .2 mg via INTRAVENOUS

## 2021-10-03 MED ORDER — CEFAZOLIN SODIUM-DEXTROSE 2-4 GM/100ML-% IV SOLN
2.0000 g | INTRAVENOUS | Status: AC
  Administered 2021-10-03: 2 g via INTRAVENOUS

## 2021-10-03 MED ORDER — PROTAMINE SULFATE 10 MG/ML IV SOLN
INTRAVENOUS | Status: DC | PRN
  Administered 2021-10-03: 10 mg via INTRAVENOUS
  Administered 2021-10-03 (×2): 20 mg via INTRAVENOUS

## 2021-10-03 MED ORDER — FENTANYL CITRATE (PF) 250 MCG/5ML IJ SOLN
INTRAMUSCULAR | Status: DC | PRN
  Administered 2021-10-03: 100 ug via INTRAVENOUS
  Administered 2021-10-03: 50 ug via INTRAVENOUS

## 2021-10-03 MED ORDER — ACETAMINOPHEN 325 MG RE SUPP
325.0000 mg | RECTAL | Status: DC | PRN
  Filled 2021-10-03: qty 2

## 2021-10-03 MED ORDER — DOCUSATE SODIUM 100 MG PO CAPS: 100.0000 mg | ORAL_CAPSULE | Freq: Every day | ORAL | Status: DC | PRN

## 2021-10-03 MED ORDER — DIPHENHYDRAMINE HCL 50 MG/ML IJ SOLN
50.0000 mg | Freq: Once | INTRAMUSCULAR | Status: AC
  Administered 2021-10-03: 50 mg via INTRAVENOUS
  Filled 2021-10-03: qty 1

## 2021-10-03 MED ORDER — LORATADINE 10 MG PO TABS
10.0000 mg | ORAL_TABLET | Freq: Every day | ORAL | Status: DC
Start: 2021-10-04 — End: 2021-10-04
  Administered 2021-10-04: 10 mg via ORAL
  Filled 2021-10-03: qty 1

## 2021-10-03 MED ORDER — EPHEDRINE SULFATE-NACL 50-0.9 MG/10ML-% IV SOSY
PREFILLED_SYRINGE | INTRAVENOUS | Status: DC | PRN
  Administered 2021-10-03: 15 mg via INTRAVENOUS
  Administered 2021-10-03: 10 mg via INTRAVENOUS
  Administered 2021-10-03: 5 mg via INTRAVENOUS

## 2021-10-03 MED ORDER — ALUM & MAG HYDROXIDE-SIMETH 200-200-20 MG/5ML PO SUSP: 15.0000 mL | ORAL | Status: DC | PRN

## 2021-10-03 MED ORDER — IODIXANOL 320 MG/ML IV SOLN
INTRAVENOUS | Status: DC | PRN
  Administered 2021-10-03: 135 mL via INTRA_ARTERIAL

## 2021-10-03 MED ORDER — HYDRALAZINE HCL 20 MG/ML IJ SOLN
INTRAMUSCULAR | Status: AC
  Filled 2021-10-03: qty 1

## 2021-10-03 MED ORDER — ROSUVASTATIN CALCIUM 5 MG PO TABS
5.0000 mg | ORAL_TABLET | ORAL | Status: DC
  Administered 2021-10-04: 5 mg via ORAL
  Filled 2021-10-03: qty 1

## 2021-10-03 MED ORDER — PHENYLEPHRINE HCL-NACL 20-0.9 MG/250ML-% IV SOLN
INTRAVENOUS | Status: DC | PRN
  Administered 2021-10-03: 50 ug/min via INTRAVENOUS

## 2021-10-03 MED ORDER — ACETAMINOPHEN 325 MG PO TABS
325.0000 mg | ORAL_TABLET | ORAL | Status: DC | PRN
  Administered 2021-10-03: 650 mg via ORAL
  Administered 2021-10-03: 325 mg via ORAL
  Filled 2021-10-03 (×2): qty 2

## 2021-10-03 MED ORDER — METHOCARBAMOL 1000 MG/10ML IJ SOLN
500.0000 mg | Freq: Three times a day (TID) | INTRAVENOUS | Status: DC | PRN
  Filled 2021-10-03: qty 5

## 2021-10-03 MED ORDER — LIDOCAINE 2% (20 MG/ML) 5 ML SYRINGE
INTRAMUSCULAR | Status: AC
  Filled 2021-10-03: qty 5

## 2021-10-03 MED ORDER — 0.9 % SODIUM CHLORIDE (POUR BTL) OPTIME
TOPICAL | Status: DC | PRN
  Administered 2021-10-03: 1000 mL

## 2021-10-03 MED ORDER — LIDOCAINE 2% (20 MG/ML) 5 ML SYRINGE
INTRAMUSCULAR | Status: DC | PRN
  Administered 2021-10-03: 60 mg via INTRAVENOUS

## 2021-10-03 MED ORDER — CHLORHEXIDINE GLUCONATE 0.12 % MT SOLN: 15.0000 mL | Freq: Once | OROMUCOSAL | Status: AC

## 2021-10-03 MED ORDER — PHENYLEPHRINE 40 MCG/ML (10ML) SYRINGE FOR IV PUSH (FOR BLOOD PRESSURE SUPPORT)
PREFILLED_SYRINGE | INTRAVENOUS | Status: DC | PRN
  Administered 2021-10-03: 80 ug via INTRAVENOUS
  Administered 2021-10-03 (×2): 40 ug via INTRAVENOUS

## 2021-10-03 MED ORDER — MAGNESIUM SULFATE 2 GM/50ML IV SOLN: 2.0000 g | Freq: Every day | INTRAVENOUS | Status: DC | PRN

## 2021-10-03 MED ORDER — METOPROLOL TARTRATE 5 MG/5ML IV SOLN: 2.0000 mg | INTRAVENOUS | Status: DC | PRN

## 2021-10-03 MED ORDER — HEPARIN SODIUM (PORCINE) 5000 UNIT/ML IJ SOLN: 5000.0000 [IU] | Freq: Three times a day (TID) | INTRAMUSCULAR | Status: DC

## 2021-10-03 MED ORDER — ADULT MULTIVITAMIN W/MINERALS CH
1.0000 | ORAL_TABLET | Freq: Every day | ORAL | Status: DC
  Administered 2021-10-04: 1 via ORAL
  Filled 2021-10-03: qty 1

## 2021-10-03 MED ORDER — BISACODYL 10 MG RE SUPP: 10.0000 mg | Freq: Every day | RECTAL | Status: DC | PRN

## 2021-10-03 MED ORDER — CEFAZOLIN SODIUM-DEXTROSE 2-4 GM/100ML-% IV SOLN
2.0000 g | Freq: Three times a day (TID) | INTRAVENOUS | Status: AC
  Administered 2021-10-03 (×2): 2 g via INTRAVENOUS
  Filled 2021-10-03 (×2): qty 100

## 2021-10-03 MED ORDER — HYDRALAZINE HCL 20 MG/ML IJ SOLN: 5.0000 mg | INTRAMUSCULAR | Status: DC | PRN

## 2021-10-03 MED ORDER — SIMETHICONE 80 MG PO CHEW
80.0000 mg | CHEWABLE_TABLET | Freq: Four times a day (QID) | ORAL | Status: DC | PRN
  Administered 2021-10-03: 80 mg via ORAL
  Filled 2021-10-03: qty 1

## 2021-10-03 MED ORDER — ACETAMINOPHEN 500 MG PO TABS: 1000.0000 mg | ORAL_TABLET | Freq: Once | ORAL | Status: AC

## 2021-10-03 MED ORDER — LABETALOL HCL 5 MG/ML IV SOLN: 10.0000 mg | INTRAVENOUS | Status: DC | PRN

## 2021-10-03 MED ORDER — CHLORHEXIDINE GLUCONATE 0.12 % MT SOLN
OROMUCOSAL | Status: AC
  Administered 2021-10-03: 15 mL via OROMUCOSAL
  Filled 2021-10-03: qty 15

## 2021-10-03 MED ORDER — HEPARIN SODIUM (PORCINE) 1000 UNIT/ML IJ SOLN
INTRAMUSCULAR | Status: DC | PRN
  Administered 2021-10-03: 7000 [IU] via INTRAVENOUS
  Administered 2021-10-03: 3000 [IU] via INTRAVENOUS

## 2021-10-03 SURGICAL SUPPLY — 60 items
ADH SKN CLS APL DERMABOND .7 (GAUZE/BANDAGES/DRESSINGS) ×2
BAG COUNTER SPONGE SURGICOUNT (BAG) ×3 IMPLANT
BAG SPNG CNTER NS LX DISP (BAG) ×1
BLADE CLIPPER SURG (BLADE) ×3 IMPLANT
CANISTER SUCT 3000ML PPV (MISCELLANEOUS) ×3 IMPLANT
CATH BEACON 5.038 65CM KMP-01 (CATHETERS) ×3 IMPLANT
CATH OMNI FLUSH .035X70CM (CATHETERS) ×3 IMPLANT
CLIP LIGATING EXTRA MED SLVR (CLIP) IMPLANT
CLIP LIGATING EXTRA SM BLUE (MISCELLANEOUS) IMPLANT
DERMABOND ADVANCED (GAUZE/BANDAGES/DRESSINGS) ×2
DERMABOND ADVANCED .7 DNX12 (GAUZE/BANDAGES/DRESSINGS) ×2 IMPLANT
DEVICE CLOSURE PERCLS PRGLD 6F (VASCULAR PRODUCTS) ×8 IMPLANT
DRSG TEGADERM 2-3/8X2-3/4 SM (GAUZE/BANDAGES/DRESSINGS) ×6 IMPLANT
DRYSEAL FLEXSHEATH 12FR 33CM (SHEATH) ×1
DRYSEAL FLEXSHEATH 16FR 33CM (SHEATH) ×1
ELECT REM PT RETURN 9FT ADLT (ELECTROSURGICAL) ×2
ELECTRODE REM PT RTRN 9FT ADLT (ELECTROSURGICAL) ×4 IMPLANT
EXCLDR EXT ENDO 28X4.5 16F (Endovascular Graft) ×2 IMPLANT
EXCLDR TRNK ENDO 26X14.5X12 16 (Endovascular Graft) ×2 IMPLANT
EXCLUDER EXT ENDO 28X4.5 16F (Endovascular Graft) IMPLANT
EXCLUDER TNK END 26X14.5X12 16 (Endovascular Graft) IMPLANT
GAUZE 4X4 16PLY ~~LOC~~+RFID DBL (SPONGE) ×2 IMPLANT
GAUZE SPONGE 2X2 8PLY STRL LF (GAUZE/BANDAGES/DRESSINGS) ×4 IMPLANT
GLIDEWIRE ADV .035X180CM (WIRE) ×1 IMPLANT
GLOVE SURG ENC MOIS LTX SZ7.5 (GLOVE) ×3 IMPLANT
GOWN STRL REUS W/ TWL LRG LVL3 (GOWN DISPOSABLE) ×4 IMPLANT
GOWN STRL REUS W/ TWL XL LVL3 (GOWN DISPOSABLE) ×4 IMPLANT
GOWN STRL REUS W/TWL LRG LVL3 (GOWN DISPOSABLE) ×4
GOWN STRL REUS W/TWL XL LVL3 (GOWN DISPOSABLE) ×2
GRAFT BALLN CATH 65CM (STENTS) ×2 IMPLANT
KIT BASIN OR (CUSTOM PROCEDURE TRAY) ×3 IMPLANT
KIT DRAIN CSF ACCUDRAIN (MISCELLANEOUS) IMPLANT
KIT TURNOVER KIT B (KITS) ×3 IMPLANT
LEG CONTRALATERAL 16X14.5X10 (Vascular Products) ×1 IMPLANT
LEG CONTRALATERAL 16X14.5X12 (Vascular Products) ×1 IMPLANT
NS IRRIG 1000ML POUR BTL (IV SOLUTION) ×3 IMPLANT
PACK ENDOVASCULAR (PACKS) ×3 IMPLANT
PAD ARMBOARD 7.5X6 YLW CONV (MISCELLANEOUS) ×6 IMPLANT
PERCLOSE PROGLIDE 6F (VASCULAR PRODUCTS) ×8
SET MICROPUNCTURE 5F STIFF (MISCELLANEOUS) ×3 IMPLANT
SHEATH BRITE TIP 8FR 23CM (SHEATH) ×3 IMPLANT
SHEATH DRYSEAL FLEX 12FR 33CM (SHEATH) IMPLANT
SHEATH DRYSEAL FLEX 16FR 33CM (SHEATH) IMPLANT
SHEATH PINNACLE 8F 10CM (SHEATH) ×3 IMPLANT
SPONGE GAUZE 2X2 STER 10/PKG (GAUZE/BANDAGES/DRESSINGS) ×1
SPONGE T-LAP 18X18 ~~LOC~~+RFID (SPONGE) ×1 IMPLANT
STENT GRAFT BALLN CATH 65CM (STENTS) ×2
STOPCOCK MORSE 400PSI 3WAY (MISCELLANEOUS) ×3 IMPLANT
SUT MNCRL AB 4-0 PS2 18 (SUTURE) ×5 IMPLANT
SUT PROLENE 5 0 C 1 24 (SUTURE) IMPLANT
SUT VIC AB 2-0 CT1 27 (SUTURE)
SUT VIC AB 2-0 CT1 TAPERPNT 27 (SUTURE) IMPLANT
SUT VIC AB 3-0 SH 27 (SUTURE) ×2
SUT VIC AB 3-0 SH 27X BRD (SUTURE) IMPLANT
SYR 20ML LL LF (SYRINGE) ×3 IMPLANT
TOWEL GREEN STERILE (TOWEL DISPOSABLE) ×3 IMPLANT
TRAY FOLEY MTR SLVR 14FR STAT (SET/KITS/TRAYS/PACK) ×1 IMPLANT
TUBING INJECTOR 48 (MISCELLANEOUS) ×3 IMPLANT
WIRE AMPLATZ SS-J .035X180CM (WIRE) ×5 IMPLANT
WIRE BENTSON .035X145CM (WIRE) ×6 IMPLANT

## 2021-10-03 NOTE — H&P (Signed)
?  ? ?  ?HPI: ?  ?Ann White is a 76 y.o. female recently found to have abdominal aortic aneurysm on duplex.  She also has known bilateral carotid artery stenosis.  She is here today to follow-up with CT scan and also bilateral lower extremity duplexes.  She does not have any lower extremity pain denies any new back or abdominal pain. ?  ?    ?Past Medical History:  ?Diagnosis Date  ? Allergy    ? Anxiety    ? Arthritis    ? Carotid stenosis    ?  a. 02/2016 Carotid U/S: Bilateral 40-59%; b. 03/2017 Carotid U/S: RICA 94-76%, LICA 5-46%; c. 50/3546 Carotid U/S: bilat 40-59% ICA dzs.  ? Cervical spondylosis    ?  s/p spine injections  ? CKD (chronic kidney disease), stage III ([place])    ? Depression    ? Ectatic abdominal aorta ([place]) 2016  ?  rpt [place] 5 yrs  ? GERD (gastroesophageal reflux disease)    ? History of chicken pox    ? History of diverticulitis of colon    ? HLD (hyperlipidemia)    ? HTN (hypertension)    ? Irritable bowel syndrome with constipation    ? Lichen sclerosus et atrophicus    ? NICM (nonischemic cardiomyopathy) ([place])    ?  a. 04/2017 Echo: EF 35-40%, diff HK; b. 05/2017 MV: EF 56%, no ischemia/infarct; c. 04/2018 Echo: EF 55-60%, no rwma, Gr2 DD; d. 03/2020 Echo: EF 40-45%, nl PASP, mod dil LA, mild to mod dil RA, Triv MR.  ? Obesity    ? Persistent atrial fibrillation ([place])    ?  a. Dx 03/2020. CHA2DS2VASc = 5-->xarelto '15mg'$  daily.  ? Premature atrial contraction    ?  a. 04/2017 48hr Holter: Predominant rhythm - sinus. Rare PVC's, freq PAC's with freq atrial runs up to 34 beats-->metoprolol started.  ? Seasonal allergic rhinitis    ?  ?     ?Family History  ?Problem Relation Age of Onset  ? CAD Mother 68  ?      MI  ? Hypertension Mother    ? Parkinson's disease Father    ? Diabetes Father    ? CAD Father 76  ?      MI  ? Hyperlipidemia Sister    ? Cancer Neg Hx    ? Stroke Neg Hx    ? Colon cancer Neg Hx    ? Breast cancer Neg Hx    ? Colon polyps Neg Hx    ? Esophageal cancer Neg Hx    ?  Stomach cancer Neg Hx    ? Rectal cancer Neg Hx    ?  ?     ?Past Surgical History:  ?Procedure Laterality Date  ? bladder tack   1990s  ? BREAST BIOPSY Right 03/05/2012  ?   benign  ? CARDIOVERSION N/A 05/17/2020  ?  Procedure: CARDIOVERSION;  Surgeon: Nelva Bush, MD;  Location: [facility];  Service: Cardiovascular;  Laterality: N/A;  ? CARPAL TUNNEL RELEASE   2004, 2013  ?  bilateral  ? CHOLECYSTECTOMY   1990s  ? COLONOSCOPY   2002  ? COLONOSCOPY   06/2013  ?  mod diverticulosis, 3 tubular adenomas, int hem rpt 5 yrs [place] Plan)  ? COLONOSCOPY   06/2018  ?  TA,c olonic angiodysplastic lesion, mod diverticulosis, rpt 5 yrs [facility] Plan)  ? DEXA   07/2011  ?  normal, T score -0.3  ?  ESOPHAGOGASTRODUODENOSCOPY ENDOSCOPY   2004  ?  normal [facility] Plan)  ? TOTAL ABDOMINAL HYSTERECTOMY W/ BILATERAL SALPINGOOPHORECTOMY   1990s  ?  complete, dysmenorrhea and fibroids  ?  ?  ?Short Social History:  ?Social History  ?  ?     ?Tobacco Use  ? Smoking status: Former  ?    Packs/day: 0.25  ?    Years: 3.00  ?    Pack years: 0.75  ?    Types: Cigarettes  ?    Quit date: 20  ?    Years since quitting: 33.1  ? Smokeless tobacco: Never  ? Tobacco comments:  ?    minimal smoking history  ?Substance Use Topics  ? Alcohol use: No  ?    Alcohol/week: 0.0 standard drinks  ?  ?  ?     ?Allergies  ?Allergen Reactions  ? Sulfa Antibiotics Swelling  ?    Oral swelling  ? Nexletol [Bempedoic Acid]    ?    Dizziness, "eyes felt funny"  ? Tricor [Fenofibrate] Other (See Comments)  ?    myalgias  ? Zetia [Ezetimibe] Other (See Comments)  ?    Dizziness  ? Contrast Media [Iodinated Contrast Media] Other (See Comments)  ?    Oral contrast caused mouth blisters  ? Lipitor [Atorvastatin] Other (See Comments)  ?    myalgias  ? Pravastatin Other (See Comments)  ?    myalgias  ?  ?  ?      ?Current Outpatient Medications  ?Medication Sig Dispense Refill  ? acetaminophen (TYLENOL) 500 MG tablet Take 500-1,000 mg by mouth every 6 (six) hours as needed (knee pain.).       ? apixaban (ELIQUIS) 5 MG TABS tablet Take 5 mg by mouth 2 (two) times daily.      ? cetirizine (ZYRTEC) 10 MG tablet Take 10 mg by mouth daily.       ? Cholecalciferol (VITAMIN D3) 1000 UNITS CAPS Take 1 capsule (1,000 Units total) by mouth daily.      ? clonazePAM (KLONOPIN) 1 MG tablet Take 1 tablet by mouth twice daily as needed 60 tablet 0  ? diphenhydrAMINE (BENADRYL) 50 MG capsule Take 50 mg by mouth 1 hour prior to your procedure. 1 capsule 0  ? docusate sodium (COLACE) 100 MG capsule Take 100-200 mg by mouth daily as needed (constipation.).      ? dofetilide (TIKOSYN) 125 MCG capsule TAKE 1 CAPSULE BY MOUTH TWO TIMES A DAY 180 capsule 3  ? Evolocumab (REPATHA SURECLICK) 416 MG/ML SOAJ Inject 1 pen into the skin every 14 (fourteen) days. 6 mL 3  ? gabapentin (NEURONTIN) 300 MG capsule TAKE ONE CAPSULE EVERY MORNING AND 2 CAPSULES AT BEDTIME 270 capsule 2  ? losartan (COZAAR) 25 MG tablet Take 1 tablet by mouth once daily 90 tablet 1  ? Multiple Vitamin (MULTIVITAMIN WITH MINERALS) TABS tablet Take 1 tablet by mouth daily.      ? oxymetazoline (AFRIN) 0.05 % nasal spray Place 1 spray into both nostrils 2 (two) times daily as needed for congestion.      ? pantoprazole (PROTONIX) 40 MG tablet Take 1 tablet by mouth once daily 90 tablet 0  ? predniSONE (DELTASONE) 50 MG tablet One tablet ('50mg'$ ) 13 hours prior to procedure; one tablet ('50mg'$ ) 7 hours prior to procedure and then one tablet (50 mg) one hour prior to procedure. 3 tablet 0  ? rosuvastatin (CRESTOR) 5 MG tablet Take 1 tablet (5  mg total) by mouth 3 (three) times a week. 36 tablet 3  ? sertraline (ZOLOFT) 100 MG tablet Take 1 tablet by mouth once daily 90 tablet 0  ? traMADol (ULTRAM) 50 MG tablet TAKE 1 TABLET BY MOUTH THREE TIMES DAILY AS NEEDED FOR  KNEE  PAIN 120 tablet 0  ? vitamin B-12 (CYANOCOBALAMIN) 500 MCG tablet Take 500 mcg by mouth daily.       ?  ?No current facility-administered medications for this visit.  ?  ?  ?Review of Systems   ?Constitutional:  Constitutional negative. ?HENT: HENT negative.  ?Eyes: Eyes negative.  ?Respiratory: Respiratory negative.  ?Cardiovascular: Positive for irregular heartbeat.  ?GI: Gastrointestinal negative.  ?Musculoskeletal: Musculoskeletal negative.  ?Skin: Skin negative.  ?Neurological: Neurological negative. ?Hematologic: Hematologic/lymphatic negative.  ?Psychiatric: Psychiatric negative.   ?  ?  ?  ?Objective:  ?  ?Vitals:  ? 10/03/21 0625  ?BP: (!) 155/55  ?Pulse: (!) 48  ?Resp: 18  ?Temp: (!) 97.5 ?F (36.4 ?C)  ?SpO2: 99%  ? ? ?  ?Physical Exam ?HENT:  ?   Head: Normocephalic.  ?   Nose:  ?   Comments: Wearing a mask ?Eyes:  ?   Pupils: Pupils are equal, round, and reactive to light.  ?Cardiovascular:  ?   Pulses:     ?     Femoral pulses are 2+ on the right side and 2+ on the left side. ?     Popliteal pulses are 3+ on the right side and 3+ on the left side.  ?1+ bilateral dp's ?Pulmonary:  ?   Effort: Pulmonary effort is normal.  ?Abdominal:  ?   General: Abdomen is flat.  ?   Palpations: Abdomen is soft. There is mass.  ?   Tenderness: There is no right CVA tenderness.  ?Musculoskeletal:  ?   Right lower leg: No edema.  ?   Left lower leg: No edema.  ?Skin: ?   General: Skin is warm and dry.  ?   Capillary Refill: Capillary refill takes less than 2 seconds.  ?Neurological:  ?   General: No focal deficit present.  ?   Mental Status: She is alert.  ?Psychiatric:     ?   Mood and Affect: Mood normal.     ?   Behavior: Behavior normal.     ?   Thought Content: Thought content normal.  ?  ?  ? ?  ?   ?Assessment/Plan:  ?  ?76 year old female with 5.2 cm abdominal aortic aneurysm.  We discussed the signs and symptoms of rupture as well as the risk being around 5% for a female her size.  We discussed the options being continued monitoring versus open versus endovascular repair. She appears to be a candidate for endovascular repair. Cardiology has evaluated, off eliquis for 2 days prior. Will give benadryl  and hydrocortisone today in OR.   ?  ?  ?  ?Tad Fancher C. Donzetta Matters, MD ?[facility] ?Office: [phone] ?Pager: [phone] ? ?

## 2021-10-03 NOTE — Anesthesia Postprocedure Evaluation (Signed)
Anesthesia Post Note ? ?Patient: Ann White ? ?Procedure(s) Performed: ABDOMINAL AORTIC ENDOVASCULAR STENT GRAFT ([place]) ?ULTRASOUND GUIDANCE FOR VASCULAR ACCESS ([place]in) ? ?  ? ?Patient location during evaluation: PACU ?Anesthesia Type: General ?Level of consciousness: awake and alert ?Pain management: pain level controlled ?Vital Signs Assessment: post-procedure vital signs reviewed and stable ?Respiratory status: spontaneous breathing, nonlabored ventilation and respiratory function stable ?Cardiovascular status: blood pressure returned to baseline and stable ?Postop Assessment: no apparent nausea or vomiting ?Anesthetic complications: no ? ? ?No notable events documented. ? ?Last Vitals:  ?Vitals:  ? 10/03/21 1015 10/03/21 1052  ?BP: (!) 130/51 (!) 107/55  ?Pulse: 62 60  ?Resp: 13 16  ?Temp: (!) 36.4 ?C (!) 35.7 ?C  ?SpO2: 96% 95%  ?  ?Last Pain:  ?Vitals:  ? 10/03/21 1057  ?TempSrc:   ?PainSc: Asleep  ? ? ?  ?  ?  ?  ?  ?  ? ?Kendarius Vigen,W. EDMOND ? ? ? ? ?

## 2021-10-03 NOTE — Progress Notes (Addendum)
?  Day of Surgery Note ? ? ? ?Subjective:  cold and shivering but no complaints ? ? ?Vitals:  ? 10/03/21 0625 10/03/21 0945  ?BP: (!) 155/55 (!) 170/71  ?Pulse: (!) 48 60  ?Resp: 18 14  ?Temp: (!) 97.5 ?F (36.4 ?C) (!) 97.3 ?F (36.3 ?C)  ?SpO2: 99% 95%  ? ? ?Incisions:   bilateral groins are soft ?Extremities:  palpable DP pulses bilaterally ?Cardiac:  regular ?Lungs:  non labored ?Abdomen:  soft ? ? ?Assessment/Plan:  This is a 76 y.o. female who is s/p  ?EVAR ? ?-pt doing well in recovery with palpable DP pulses and bilateral groins are soft ?-pharmacy consulted to restart Tikosyn-spoke with Jessica-Benadryl was given with Tikosyn and can prolong QT interval.  Will get EKG ?-to [facility] later today and anticipate discharge tomorrow if she has uneventful post op course. ? ? ?Leontine Locket, PA-C ?10/03/2021 ?10:07 AM ?[phone] ? ?

## 2021-10-03 NOTE — Progress Notes (Signed)
Per patient's request, purse taken to husband in lobby.  ?

## 2021-10-03 NOTE — Telephone Encounter (Signed)
-----   Message from Nelva Bush, MD sent at 10/02/2021  3:07 PM EDT ----- ?Please let Ann White know that her labs are stable.  She should continue her current medications and follow-up as previously arranged. ?

## 2021-10-03 NOTE — Transfer of Care (Signed)
Immediate Anesthesia Transfer of Care Note ? ?Patient: Ann White ? ?Procedure(s) Performed: ABDOMINAL AORTIC ENDOVASCULAR STENT GRAFT (Groin) ?ULTRASOUND GUIDANCE FOR VASCULAR ACCESS (Groin) ? ?Patient Location: PACU ? ?Anesthesia Type:General ? ?Level of Consciousness: drowsy and patient cooperative ? ?Airway & Oxygen Therapy: Patient Spontanous Breathing ? ?Post-op Assessment: Report given to RN and Post -op Vital signs reviewed and stable ? ?Post vital signs: Reviewed and stable ? ?Last Vitals:  ?Vitals Value Taken Time  ?BP 170/71 10/03/21 0944  ?Temp    ?Pulse 59 10/03/21 0945  ?Resp 17 10/03/21 0945  ?SpO2 95 % 10/03/21 0945  ?Vitals shown include unvalidated device data. ? ?Last Pain:  ?Vitals:  ? 10/03/21 0625  ?TempSrc: Oral  ?PainSc: 0-No pain  ?   ? ?Patients Stated Pain Goal: 3 (10/03/21 7482) ? ?Complications: No notable events documented. ?

## 2021-10-03 NOTE — Anesthesia Procedure Notes (Signed)
Procedure Name: Intubation ?Date/Time: 10/03/2021 7:44 AM ?Performed by: Lance Coon, CRNA ?Pre-anesthesia Checklist: Patient identified, Emergency Drugs available, Suction available, Patient being monitored and Timeout performed ?Patient Re-evaluated:Patient Re-evaluated prior to induction ?Oxygen Delivery Method: Circle system utilized ?Preoxygenation: Pre-oxygenation with 100% oxygen ?Induction Type: IV induction ?Ventilation: Mask ventilation without difficulty ?Laryngoscope Size: Sabra Heck and 3 ?Grade View: Grade I ?Tube type: Oral ?Tube size: 7.0 mm ?Number of attempts: 1 ?Airway Equipment and Method: Stylet ?Placement Confirmation: ETT inserted through vocal cords under direct vision, positive ETCO2 and breath sounds checked- equal and bilateral ?Secured at: 21 cm ?Tube secured with: Tape ?Dental Injury: Teeth and Oropharynx as per pre-operative assessment  ? ? ? ? ?

## 2021-10-03 NOTE — Discharge Instructions (Signed)
  [facility]    Discharge Instructions  Endovascular Aortic Aneurysm Repair  Please refer to the following instructions for your post-procedure care. Your surgeon or Physician Assistant will discuss any changes with you.  Activity  You are encouraged to walk as much as you can. You can slowly return to normal activities but must avoid strenuous activity and heavy lifting until your doctor tells you it's OK. Avoid activities such as vacuuming or swinging a gold club. It is normal to feel tired for several weeks after your surgery. Do not drive until your doctor gives the OK and you are no longer taking prescription pain medications. It is also normal to have difficulty with sleep habits, eating, and bowel movements after surgery. These will go away with time.  Bathing/Showering  Shower daily after you go home.  Do not soak in a bathtub, hot tub, or swim until the incision heals completely.  If you have incisions in your groin, wash the groin wounds with soap and water daily and pat dry. (No tub bath-only shower)  Then put a dry gauze or washcloth there to keep this area dry to help prevent wound infection daily and as needed.  Do not use Vaseline or neosporin on your incisions.  Only use soap and water on your incisions and then protect and keep dry.  Incision Care  Shower every day. Clean your incision with mild soap and water. Pat the area dry with a clean towel. You do not need a bandage unless otherwise instructed. Do not apply any ointments or creams to your incision. If you clothing is irritating, you may cover your incision with a dry gauze pad.  Diet  Resume your normal diet. There are no special food restrictions following this procedure. A low fat/low cholesterol diet is recommended for all patients with vascular disease. In order to heal from your surgery, it is CRITICAL to get adequate nutrition. Your body requires vitamins, minerals, and protein.  Vegetables are the best source of vitamins and minerals. Vegetables also provide the perfect balance of protein. Processed food has little nutritional value, so try to avoid this.  Medications  Resume taking all of your medications unless your doctor or nurse practitioner tells you not to. If your incision is causing pain, you may take over-the-counter pain relievers such as acetaminophen (Tylenol). If you were prescribed a stronger pain medication, please be aware these medications can cause nausea and constipation. Prevent nausea by taking the medication with a snack or meal. Avoid constipation by drinking plenty of fluids and eating foods with a high amount of fiber, such as fruits, vegetables, and grains.  Do not take Tylenol if you are taking prescription pain medications.   Follow up  Our office will schedule a follow-up appointment with a CT scan 3-4 weeks after your surgery.  Please call us immediately for any of the following conditions  Severe or worsening pain in your legs or feet or in your abdomen back or chest. Increased pain, redness, drainage (pus) from your incision site. Increased abdominal pain, bloating, nausea, vomiting or persistent diarrhea. Fever of 101 degrees or higher. Swelling in your leg (s),  Reduce your risk of vascular disease  Stop smoking. If you would like help call [facility] at 1-800-QUIT-NOW (1-800-784-8669) or [place] at [phone]. Manage your cholesterol Maintain a desired weight Control your diabetes Keep your blood pressure down  If you have questions, please call the office at [phone].  

## 2021-10-03 NOTE — Op Note (Addendum)
? ? ?Patient name: Ann White MRN: [id number] DOB: 08/12/1945 Sex: female ? ?10/03/2021 ?Pre-operative Diagnosis: AAA ?Post-operative diagnosis:  Same ?Surgeon:  Eda Paschal. Donzetta Matters, MD ?Assistant: Gae Gallop, MD ?Procedure Performed: ?1.  Percutaneous access and closure bilateral common femoral arteries ?2.  Endovascular abdominal aortic aneurysm repair with main body left 26 x 14 x 12 cm extended with 14 x 10 cm device and contralateral limb 14 x 12 cm and aortic cuff 28 x 4.5 cm ? ?Indications: 76 year old female with abdominal aortic aneurysm now meets criteria for repair.  We have discussed the risk benefits alternatives and she demonstrates good understanding and presents today for endovascular abdominal aortic aneurysm repair. ? ?An experienced assistant was necessary to facilitate exchange of wires and sheaths and for interpretation of angiography. ? ?Findings: Both renal arteries were identified the left being the lowest with stiff wires in place.  After initial endografting there appeared to be a type Ia endoleak on the right lateral aspect of the graft.  Additional ballooning caused concern for extravasation which was then sealed with a proximal cuff up to the level of the left renal artery.  After completion there was no type I endoleak there were 2 late type II endoleak's likely from lumbar arteries.  Both renal arteries and hypogastric arteries filled without limitation.  Both common femoral arteries were closed percutaneously and the dorsalis pedis pulses were palpable at completion. ?  ?Procedure:  The patient was identified in the holding area and taken to the operating room where she is placed supine operative table general anesthesia was induced.  She was sterilely prepped and draped in the bilateral groins and abdomen in the usual fashion, antibiotics were minister timeout was called.  We began with ultrasound-guided cannulation of the right common femoral artery.  2 percutaneous closure  devices were placed an 8 French sheath was placed.  Following this ultrasound was used to identify the left common femoral artery and again 2 percutaneous closure devices were placed followed by an 8 [place] sheath.  Stiff wires placed in the aorta and a 16 French sheath was placed on the left and on the right a 12 French sheath and the patient was fully heparinized.  Main body device was brought through the left place to the level of L2 and Omni catheter through the right and aortogram was performed which demonstrated the left renal artery to be the lowest.  Device was then partially deployed angiography was obtained and we then moved the device down a little bit.  We then fully deployed the device.  The gate was cannulated we are able to spin an Omni catheter within the graft we also performed puff of contrast which demonstrated flow through the gate first.  We then extended on the right side with a 14 x 12 cm down to the level of the hypogastric artery.  On the left side we then extended down with a 14 x 10 cm device.  The entire device was ironed out with MO B balloon.  Completion demonstrated possible early type Ia endoleak on the right lateral aspect of the graft.  We then further performed ballooning and unfortunately after this there appeared to be some extravasation which was then cuffed with a 28 x 4-1/2 cm cuff and this was lightly ballooned and at completion there were no further type I endoleak's but there were 2 late type II endoleak.  Both renal arteries filled briskly to the hypogastric arteries.  Satisfied with this we change  her Bentson wires.  Sheath were both removed and the groins percutaneous closed.  There were good signals at the dorsalis pedis pulses bilaterally and patient was administered 50 mg of protamine.  Both groin incisions were closed with 4-0 Monocryl suture and Dermabond placed at the skin site.  She was awakened from anesthesia having tolerated procedure without any complication.   All counts were correct at completion. ? ?EBL: 100 cc. ? ?Contrast: 135 cc ? ? ? ? ?Lasean Gorniak C. Donzetta Matters, MD ?[facility] ?Office: [phone] ?Pager: [phone] ? ? ?

## 2021-10-03 NOTE — Progress Notes (Signed)
VASCULAR SURGERY: ? ?Tikosyn held tonight because of a prolonged QT interval on her EKG postop. ? ?Ann Gallop, MD ?8:23 PM ? ?

## 2021-10-03 NOTE — Progress Notes (Signed)
Patient arrived from PACU, VSS, CHG done, Groins level 0, oriented to unit, call light within reach, tele started.  ? ? ?Alvis Lemmings, RN ?10/03/2021 ?11:06 AM  ?

## 2021-10-03 NOTE — Telephone Encounter (Signed)
Attempted to call pt. No answer. Lmtcb.  

## 2021-10-03 NOTE — Anesthesia Procedure Notes (Signed)
Arterial Line Insertion ?Start/End3/21/2023 7:05 AM, 10/03/2021 7:15 AM ?Performed by: Lance Coon, CRNA ? Patient location: Pre-op. ?Preanesthetic checklist: patient identified, IV checked, site marked, risks and benefits discussed, surgical consent, monitors and equipment checked, pre-op evaluation, timeout performed and anesthesia consent ?Lidocaine 1% used for infiltration ?Right, radial was placed ?Catheter size: 20 G ?Hand hygiene performed , maximum sterile barriers used  and Seldinger technique used ? ?Attempts: 2 ?Procedure performed without using ultrasound guided technique. ?Following insertion, dressing applied and Biopatch. ?Post procedure assessment: normal and unchanged ? ?Patient tolerated the procedure well with no immediate complications. ? ? ?

## 2021-10-04 ENCOUNTER — Encounter: Payer: Self-pay | Admitting: Family Medicine

## 2021-10-04 DIAGNOSIS — Z95828 Presence of other vascular implants and grafts: Secondary | ICD-10-CM | POA: Insufficient documentation

## 2021-10-04 LAB — CBC
HCT: 23.7 % — ABNORMAL LOW (ref 36.0–46.0)
Hemoglobin: 7.9 g/dL — ABNORMAL LOW (ref 12.0–15.0)
MCH: 30.2 pg (ref 26.0–34.0)
MCHC: 33.3 g/dL (ref 30.0–36.0)
MCV: 90.5 fL (ref 80.0–100.0)
Platelets: 111 10*3/uL — ABNORMAL LOW (ref 150–400)
RBC: 2.62 MIL/uL — ABNORMAL LOW (ref 3.87–5.11)
RDW: 13.2 % (ref 11.5–15.5)
WBC: 5.5 10*3/uL (ref 4.0–10.5)
nRBC: 0 % (ref 0.0–0.2)

## 2021-10-04 LAB — BASIC METABOLIC PANEL
Anion gap: 8 (ref 5–15)
BUN: 21 mg/dL (ref 8–23)
CO2: 25 mmol/L (ref 22–32)
Calcium: 8.6 mg/dL — ABNORMAL LOW (ref 8.9–10.3)
Chloride: 106 mmol/L (ref 98–111)
Creatinine, Ser: 1.29 mg/dL — ABNORMAL HIGH (ref 0.44–1.00)
GFR, Estimated: 44 mL/min — ABNORMAL LOW (ref >60)
Glucose, Bld: 84 mg/dL (ref 70–99)
Potassium: 4.2 mmol/L (ref 3.5–5.1)
Sodium: 139 mmol/L (ref 135–145)

## 2021-10-04 MED ORDER — APIXABAN 5 MG PO TABS
5.0000 mg | ORAL_TABLET | Freq: Two times a day (BID) | ORAL | 3 refills | Status: DC
Start: 2021-10-04 — End: 2022-03-12

## 2021-10-04 MED ORDER — TRAMADOL HCL 50 MG PO TABS: ORAL_TABLET | ORAL | 0 refills | Status: DC

## 2021-10-04 NOTE — Progress Notes (Addendum)
?  Progress Note ? ? ? ?10/04/2021 ?8:55 AM ?1 Day Post-Op ? ?Subjective:  Feeling well this morning and ready to go home ? ? ?Vitals:  ? 10/04/21 0450 10/04/21 0754  ?BP: (!) 101/51 107/78  ?Pulse: 63 61  ?Resp: 18 18  ?Temp: 97.8 ?F (36.6 ?C) 97.9 ?F (36.6 ?C)  ?SpO2: 95% 96%  ? ?Physical Exam: ?Cardiac: RRR ?Lungs:  non labored  ?Incisions:  B groin cath sites without hematoma ?Extremities:  palpable DP pulses ?Abdomen:  soft, NT, ND ?Neurologic: A&O ? ?CBC ?   ?Component Value Date/Time  ? WBC 5.5 10/04/2021 0159  ? RBC 2.62 (L) 10/04/2021 0159  ? HGB 7.9 (L) 10/04/2021 0159  ? HGB 10.9 (L) 03/24/2021 1047  ? HCT 23.7 (L) 10/04/2021 0159  ? HCT 33.4 (L) 03/24/2021 1047  ? PLT 111 (L) 10/04/2021 0159  ? PLT 192 03/24/2021 1047  ? MCV 90.5 10/04/2021 0159  ? MCV 91 03/24/2021 1047  ? MCH 30.2 10/04/2021 0159  ? MCHC 33.3 10/04/2021 0159  ? RDW 13.2 10/04/2021 0159  ? RDW 14.0 03/24/2021 1047  ? LYMPHSABS 1.4 07/19/2021 1535  ? LYMPHSABS 1.2 05/05/2020 1002  ? MONOABS 0.2 07/19/2021 1535  ? EOSABS 0.1 07/19/2021 1535  ? EOSABS 0.2 05/05/2020 1002  ? BASOSABS 0.0 07/19/2021 1535  ? BASOSABS 0.0 05/05/2020 1002  ? ? ?BMET ?   ?Component Value Date/Time  ? NA 139 10/04/2021 0159  ? NA 144 09/29/2021 1421  ? K 4.2 10/04/2021 0159  ? CL 106 10/04/2021 0159  ? CO2 25 10/04/2021 0159  ? GLUCOSE 84 10/04/2021 0159  ? BUN 21 10/04/2021 0159  ? BUN 32 (H) 09/29/2021 1421  ? CREATININE 1.29 (H) 10/04/2021 0159  ? CREATININE 1.19 07/26/2011 0000  ? CALCIUM 8.6 (L) 10/04/2021 0159  ? GFRNONAA 44 (L) 10/04/2021 0159  ? GFRAA 44 (L) 05/05/2020 1002  ? ? ?INR ?   ?Component Value Date/Time  ? INR 1.4 (H) 09/29/2021 1030  ? ? ? ?Intake/Output Summary (Last 24 hours) at 10/04/2021 0855 ?Last data filed at 10/04/2021 [phone] ?Gross per 24 hour  ?Intake 1929.43 ml  ?Output 2310 ml  ?Net -380.57 ml  ? ? ? ?Assessment/Plan:  76 y.o. female is s/p EVAR 1 Day Post-Op  ? ?QT interval improved this morning.  1st degree AV block this morning also  present on 09/22/21 ecg; She will follow up with Dr. Quentin Ore next week to re-evaluate QT interval ?BLE well perfused with palpable DP pulses ?Groin cath sites without hematoma ?Follow up in 1 month with CTA a/p ? ? ?Dagoberto Ligas, PA-C ?[facility] ?[phone] ?10/04/2021 ?8:55 AM ? ?I have independently interviewed patient and agree with PA assessment and plan above. She was discharged earlier today and will f/u with CTA in about 1 month. ? ?Amritha Yorke C. Donzetta Matters, MD ?[facility] ?Office: [phone] ?Pager: [phone] ? ? ?

## 2021-10-04 NOTE — Progress Notes (Signed)
10/04/2021 ?10:54 AM ?Discharge AVS meds taken today and those due this evening reviewed.  Follow-up appointments and when to call md reviewed.  D/C IV and TELE.  Questions and concerns addressed.   D/C home per orders.  ?Brion Aliment C ? ?

## 2021-10-04 NOTE — Discharge Summary (Signed)
?EVAR Discharge Summary ? ? ?Ann White ?[birth year] 76 y.o. female ? ?MRN: ?[id number] ? ?Admission Date: ?10/03/2021 ? ?Discharge Date: ?10/04/21 ? ?Physician: ?Thomes Lolling* ? ?Admission Diagnosis: ?AAA (abdominal aortic aneurysm) [I71.40] ? ?Discharge Day services:   ?See progress note 10/04/2021 ? ?Hospital Course:  ?The patient was admitted to the hospital and taken to the operating room on 10/03/2021 and underwent: ?Endovascular repair of abdominal aortic aneurysm by Dr. Donzetta Matters on 10/03/2021.  Patient tolerated the procedure well and was admitted to the hospital postoperatively POD #1 she is ambulating without difficulty, is without abdominal pain, and ready for discharge home.  She had some QT interval prolongation overnight and Tikosyn was held.  Repeat EKG this morning demonstrated a normal QT interval.  I unofficially consulted cardiology who recommended okay to continue Tikosyn with short order follow-up with her EP cardiologist.  I arranged a appointment with Dr. Quentin Ore in 1 week.  She will follow-up with [place] in 1 month with a CTA abdomen and pelvis.  She will be provided 1 to 2 days of narcotic pain medication for continued postoperative pain control.  She will be discharged home this morning in stable condition. ? ?CBC ?   ?Component Value Date/Time  ? WBC 5.5 10/04/2021 0159  ? RBC 2.62 (L) 10/04/2021 0159  ? HGB 7.9 (L) 10/04/2021 0159  ? HGB 10.9 (L) 03/24/2021 1047  ? HCT 23.7 (L) 10/04/2021 0159  ? HCT 33.4 (L) 03/24/2021 1047  ? PLT 111 (L) 10/04/2021 0159  ? PLT 192 03/24/2021 1047  ? MCV 90.5 10/04/2021 0159  ? MCV 91 03/24/2021 1047  ? MCH 30.2 10/04/2021 0159  ? MCHC 33.3 10/04/2021 0159  ? RDW 13.2 10/04/2021 0159  ? RDW 14.0 03/24/2021 1047  ? LYMPHSABS 1.4 07/19/2021 1535  ? LYMPHSABS 1.2 05/05/2020 1002  ? MONOABS 0.2 07/19/2021 1535  ? EOSABS 0.1 07/19/2021 1535  ? EOSABS 0.2 05/05/2020 1002  ? BASOSABS 0.0 07/19/2021 1535  ? BASOSABS 0.0 05/05/2020 1002  ? ? ?BMET ?    ?Component Value Date/Time  ? NA 139 10/04/2021 0159  ? NA 144 09/29/2021 1421  ? K 4.2 10/04/2021 0159  ? CL 106 10/04/2021 0159  ? CO2 25 10/04/2021 0159  ? GLUCOSE 84 10/04/2021 0159  ? BUN 21 10/04/2021 0159  ? BUN 32 (H) 09/29/2021 1421  ? CREATININE 1.29 (H) 10/04/2021 0159  ? CREATININE 1.19 07/26/2011 0000  ? CALCIUM 8.6 (L) 10/04/2021 0159  ? GFRNONAA 44 (L) 10/04/2021 0159  ? GFRAA 44 (L) 05/05/2020 1002  ? ? ? ? ? ? ? ?Discharge Diagnosis:  ?AAA (abdominal aortic aneurysm) [I71.40] ? ?Secondary Diagnosis: ?Patient Active Problem List  ? Diagnosis Date Noted  ? History of repair of aneurysm of abdominal aorta using endovascular stent graft 10/04/2021  ? AAA (abdominal aortic aneurysm) 10/03/2021  ? Infrarenal abdominal aortic aneurysm (AAA) without rupture 09/23/2021  ? Sinus bradycardia 03/24/2021  ? Secondary hypercoagulable state ([place]) 08/08/2020  ? Chronic HFrEF (heart failure with reduced ejection fraction) ([place]) 07/15/2020  ? Medication management 07/13/2020  ? Statin myopathy 06/21/2020  ? Persistent atrial fibrillation ([place]) 03/30/2020  ? Chronic insomnia 03/30/2020  ? Systolic murmur 26/71/2458  ? Nonischemic cardiomyopathy ([place]) 07/17/2017  ? PAC (premature atrial contraction) 04/17/2017  ? Irregular heart beat 03/05/2017  ? Bilateral carotid artery stenosis 03/14/2016  ? Encounter for general adult medical examination with abnormal findings 02/28/2016  ? Right sided sciatica 07/15/2015  ? Anemia due to stage 3 chronic  kidney disease ([place]) 06/29/2015  ? Chronic right maxillary sinusitis 06/29/2015  ? AAA (abdominal aortic aneurysm) without rupture   ? Personal history of DVT (deep vein thrombosis) 05/30/2015  ? Advanced care planning/counseling discussion 03/16/2015  ? CKD (chronic kidney disease) stage 3, GFR 30-59 ml/min (HCC) 08/19/2013  ? Hot flash, menopausal 02/24/2013  ? Medicare annual wellness visit, subsequent 08/27/2012  ? Prediabetes 08/27/2012  ? Lichen sclerosus et atrophicus   ?  Irritable bowel syndrome with constipation   ? Essential hypertension   ? Seasonal allergic rhinitis   ? GERD (gastroesophageal reflux disease)   ? MDD (major depressive disorder), recurrent episode, moderate ([place])   ? History of diverticulitis of colon   ? Familial combined hyperlipidemia   ? PULMONARY FIBROSIS, POSTINFLAMMATORY 09/10/2008  ? Cervical spondylosis without myelopathy 09/09/2008  ? ?Past Medical History:  ?Diagnosis Date  ? Allergy   ? Anxiety   ? Arthritis   ? Carotid stenosis   ? a. 02/2016 Carotid U/S: Bilateral 40-59%; b. 03/2017 Carotid U/S: RICA 50-93%, LICA 2-67%; c. 06/4579 Carotid U/S: bilat 40-59% ICA dzs.  ? Cervical spondylosis   ? s/p spine injections  ? CKD (chronic kidney disease), stage III ([place])   ? Depression   ? DVT (deep venous thrombosis) ([place])   ? RLE DVT 05/27/15  ? Dysrhythmia   ? afib  ? Ectatic abdominal aorta ([place]) 2016  ? rpt [place] 5 yrs  ? GERD (gastroesophageal reflux disease)   ? History of chicken pox   ? History of diverticulitis of colon   ? HLD (hyperlipidemia)   ? HTN (hypertension)   ? Irritable bowel syndrome with constipation   ? Lichen sclerosus et atrophicus   ? NICM (nonischemic cardiomyopathy) ([place])   ? a. 04/2017 Echo: EF 35-40%, diff HK; b. 05/2017 MV: EF 56%, no ischemia/infarct; c. 04/2018 Echo: EF 55-60%, no rwma, Gr2 DD; d. 03/2020 Echo: EF 40-45%, nl PASP, mod dil LA, mild to mod dil RA, Triv MR.  ? Obesity   ? Persistent atrial fibrillation ([place])   ? a. Dx 03/2020. CHA2DS2VASc = 5-->xarelto '15mg'$  daily.  ? Premature atrial contraction   ? a. 04/2017 48hr Holter: Predominant rhythm - sinus. Rare PVC's, freq PAC's with freq atrial runs up to 34 beats-->metoprolol started.  ? Seasonal allergic rhinitis   ? ? ? ?Allergies as of 10/04/2021   ? ?   Reactions  ? Sulfa Antibiotics Swelling  ? Oral swelling  ? Nexletol [bempedoic Acid]   ? Dizziness, "eyes felt funny"  ? Tricor [fenofibrate] Other (See Comments)  ? myalgias  ? Zetia [ezetimibe] Other (See Comments)  ?  Dizziness  ? Contrast Media [iodinated Contrast Media] Other (See Comments)  ? Oral contrast caused mouth blisters  ? Lipitor [atorvastatin] Other (See Comments)  ? myalgias  ? Pravastatin Other (See Comments)  ? myalgias  ? ?  ? ?  ?Medication List  ?  ? ?TAKE these medications   ? ?acetaminophen 500 MG tablet ?Commonly known as: TYLENOL ?Take 500-1,000 mg by mouth every 6 (six) hours as needed (knee pain.). ?  ?apixaban 5 MG Tabs tablet ?Commonly known as: ELIQUIS ?Take 1 tablet (5 mg total) by mouth 2 (two) times daily. ?  ?cetirizine 10 MG tablet ?Commonly known as: ZYRTEC ?Take 10 mg by mouth daily as needed for allergies. ?  ?clonazePAM 1 MG tablet ?Commonly known as: KLONOPIN ?Take 1 tablet by mouth twice daily as needed ?What changed:  ?how much to take ?  when to take this ?  ?diphenhydrAMINE 50 MG capsule ?Commonly known as: BENADRYL ?Take 50 mg by mouth 1 hour prior to your procedure. ?  ?docusate sodium 100 MG capsule ?Commonly known as: COLACE ?Take 100-200 mg by mouth daily as needed (constipation.). ?  ?dofetilide 125 MCG capsule ?Commonly known as: TIKOSYN ?TAKE 1 CAPSULE BY MOUTH TWO TIMES A DAY ?  ?gabapentin 300 MG capsule ?Commonly known as: NEURONTIN ?TAKE ONE CAPSULE EVERY MORNING AND 2 CAPSULES AT BEDTIME ?What changed: See the new instructions. ?  ?losartan 25 MG tablet ?Commonly known as: COZAAR ?Take 2 tablets (50 mg total) by mouth daily. ?  ?multivitamin with minerals Tabs tablet ?Take 1 tablet by mouth daily. ?  ?oxymetazoline 0.05 % nasal spray ?Commonly known as: AFRIN ?Place 1 spray into both nostrils 2 (two) times daily as needed for congestion. ?  ?pantoprazole 40 MG tablet ?Commonly known as: PROTONIX ?Take 1 tablet by mouth once daily ?What changed: when to take this ?  ?predniSONE 50 MG tablet ?Commonly known as: DELTASONE ?One tablet ('50mg'$ ) 13 hours prior to procedure; one tablet ('50mg'$ ) 7 hours prior to procedure and then one tablet (50 mg) one hour prior to procedure. ?  ?Repatha  SureClick 491 MG/ML Soaj ?Generic drug: Evolocumab ?Inject 1 pen into the skin every 14 (fourteen) days. ?  ?rosuvastatin 5 MG tablet ?Commonly known as: CRESTOR ?Take 1 tablet (5 mg total) by mouth 3 (three

## 2021-10-04 NOTE — TOC Transition Note (Signed)
Transition of Care (TOC) - CM/SW Discharge Note ?Marvetta Gibbons Therapist, sports, BSN ?Transitions of Care ?Unit 4E- RN Case Manager ?See Treatment Team for direct phone #  ? ? ?Patient Details  ?Name: Ann White ?MRN: [id number] ?Date of Birth: Dec 17, 1945 ? ?Transition of Care (TOC) CM/SW Contact:  ?Dahlia Client, Romeo Rabon, RN ?Phone Number: ?10/04/2021, 11:03 AM ? ? ?Clinical Narrative:    ?Pt stable for transition home today, Transition of Care Department [facility]) has reviewed patient and no TOC needs have been identified at this time.  ? ?Final next level of care: Home/Self Care ?Barriers to Discharge: No Barriers Identified ? ? ?Patient Goals and CMS Choice ?  ?  ?Choice offered to / list presented to : NA ? ?Discharge Placement ?  ?           ? Home ?  ?  ?  ? ?Discharge Plan and Services ?  ?  ?           ?DME Arranged: N/A ?DME Agency: NA ?  ?  ?  ?HH Arranged: NA ?[place] Agency: NA ?  ?  ?  ? ?Social Determinants of Health (SDOH) Interventions ?  ? ? ?Readmission Risk Interventions ? ?  10/04/2021  ? 11:03 AM  ?Readmission Risk Prevention Plan  ?Transportation Screening Complete  ?PCP or Specialist Appt within 5-7 Days Complete  ?Home Care Screening Complete  ?Medication Review (RN CM) Complete  ? ? ? ? ? ?

## 2021-10-06 NOTE — Telephone Encounter (Signed)
Received pt's social security statement and pharmacy's expense report.  ?Faxed along with completed pt assistance application to [facility] @ [phone]. ?Received confirmation fax.  ?Paperwork placed in [employer].  ?Will notify pt once we receive a response. ?

## 2021-10-06 NOTE — Telephone Encounter (Signed)
Patient is returning your call.  

## 2021-10-06 NOTE — Telephone Encounter (Signed)
Patient dropped off Social Security paperwork for assistance forms ?Patient will try and have pharmacy fax over expense report  ?

## 2021-10-06 NOTE — Telephone Encounter (Signed)
The patient has been notified of the result and verbalized understanding.  All questions (if any) were answered.     

## 2021-10-10 NOTE — Telephone Encounter (Signed)
Prior to calling pt, will forward to PharmD to ensure no other options for pt to receive Eliquis at lower cost.  ?

## 2021-10-10 NOTE — Telephone Encounter (Signed)
Patient has a medicare part D plan plus a state health plan through [employer] [facility]. Recommend pharmacy running prescription under her [place] plan.  She also likely can attach a copay card to her [place] ?

## 2021-10-10 NOTE — Telephone Encounter (Signed)
Incoming fax from [employer] patient assistance.  ? ?Patient is not eligible to receive Eliquis for the following reason(s) ?Documentation of 3% out of pocket prescription expenses, based on household adjusted gross income, not met.  ? ?

## 2021-10-10 NOTE — Progress Notes (Signed)
?Electrophysiology Office Follow up Visit Note:   ? ?Date:  10/11/2021  ? ?ID:  Ann White, DOB April 21, 1946, MRN [id number] ? ?PCP:  Ria Bush, MD  ?[facility] Cardiologist:  Nelva Bush, MD  ?[facility] Electrophysiologist:  Vickie Epley, MD  ? ? ?Interval History:   ? ?Ann White is a 76 y.o. female who presents for a follow up visit. They were last seen in clinic 05/11/2021. She has persistent AF and is maintained on dofetilide. She also takes eliquis for stroke ppx. ? ?She saw Dr End on 09/22/2021 in follow up. She is followed by Dr Donzetta Matters for her AAA. She underwent a EVAR on 10/22/8117 that was uncomplicated.  ? ? ? ?  ? ?Past Medical History:  ?Diagnosis Date  ? Allergy   ? Anxiety   ? Arthritis   ? Carotid stenosis   ? a. 02/2016 Carotid U/S: Bilateral 40-59%; b. 03/2017 Carotid U/S: RICA 14-78%, LICA 2-95%; c. 62/1308 Carotid U/S: bilat 40-59% ICA dzs.  ? Cervical spondylosis   ? s/p spine injections  ? CKD (chronic kidney disease), stage III ([place])   ? Depression   ? DVT (deep venous thrombosis) ([place])   ? RLE DVT 05/27/15  ? Dysrhythmia   ? afib  ? Ectatic abdominal aorta ([place]) 2016  ? rpt [place] 5 yrs  ? GERD (gastroesophageal reflux disease)   ? History of chicken pox   ? History of diverticulitis of colon   ? HLD (hyperlipidemia)   ? HTN (hypertension)   ? Irritable bowel syndrome with constipation   ? Lichen sclerosus et atrophicus   ? NICM (nonischemic cardiomyopathy) ([place])   ? a. 04/2017 Echo: EF 35-40%, diff HK; b. 05/2017 MV: EF 56%, no ischemia/infarct; c. 04/2018 Echo: EF 55-60%, no rwma, Gr2 DD; d. 03/2020 Echo: EF 40-45%, nl PASP, mod dil LA, mild to mod dil RA, Triv MR.  ? Obesity   ? Persistent atrial fibrillation ([place])   ? a. Dx 03/2020. CHA2DS2VASc = 5-->xarelto '15mg'$  daily.  ? Premature atrial contraction   ? a. 04/2017 48hr Holter: Predominant rhythm - sinus. Rare PVC's, freq PAC's with freq atrial runs up to 34 beats-->metoprolol started.  ? Seasonal allergic  rhinitis   ? ? ?Past Surgical History:  ?Procedure Laterality Date  ? ABDOMINAL AORTIC ANEURYSM REPAIR  09/2021  ? Donzetta Matters  ? ABDOMINAL AORTIC ENDOVASCULAR STENT GRAFT  10/03/2021  ? Procedure: ABDOMINAL AORTIC ENDOVASCULAR STENT GRAFT;  Surgeon: Waynetta Sandy, MD;  Location: [place];  Service: Vascular;;  ? ABDOMINAL HYSTERECTOMY    ? bladder tack  1990s  ? BREAST BIOPSY Right 03/05/2012  ?  benign  ? CARDIOVERSION N/A 05/17/2020  ? Procedure: CARDIOVERSION;  Surgeon: Nelva Bush, MD;  Location: [facility];  Service: Cardiovascular;  Laterality: N/A;  ? CARPAL TUNNEL RELEASE  2004, 2013  ? bilateral  ? CHOLECYSTECTOMY  1990s  ? COLONOSCOPY  2002  ? COLONOSCOPY  06/2013  ? mod diverticulosis, 3 tubular adenomas, int hem rpt 5 yrs [facility] Plan)  ? COLONOSCOPY  06/2018  ? TA,c olonic angiodysplastic lesion, mod diverticulosis, rpt 5 yrs [facility] Plan)  ? DEXA  07/2011  ? normal, T score -0.3  ? ESOPHAGOGASTRODUODENOSCOPY ENDOSCOPY  2004  ? normal Fuller Plan)  ? TOTAL ABDOMINAL HYSTERECTOMY W/ BILATERAL SALPINGOOPHORECTOMY  1990s  ? complete, dysmenorrhea and fibroids  ? ULTRASOUND GUIDANCE FOR VASCULAR ACCESS  10/03/2021  ? Procedure: ULTRASOUND GUIDANCE FOR VASCULAR ACCESS;  Surgeon: Waynetta Sandy, MD;  Location: [facility]  OR;  Service: Vascular;;  ? ? ?Current Medications: ?Current Meds  ?Medication Sig  ? acetaminophen (TYLENOL) 500 MG tablet Take 500-1,000 mg by mouth every 6 (six) hours as needed (knee pain.).  ? apixaban (ELIQUIS) 5 MG TABS tablet Take 1 tablet (5 mg total) by mouth 2 (two) times daily.  ? cetirizine (ZYRTEC) 10 MG tablet Take 10 mg by mouth daily as needed for allergies.  ? Cholecalciferol (VITAMIN D3) 1000 UNITS CAPS Take 1 capsule (1,000 Units total) by mouth daily.  ? clonazePAM (KLONOPIN) 1 MG tablet Take 1 tablet by mouth twice daily as needed (Patient taking differently: Take 0.5 mg by mouth at bedtime.)  ? docusate sodium (COLACE) 100 MG capsule Take 100-200 mg by mouth daily as needed  (constipation.).  ? dofetilide (TIKOSYN) 125 MCG capsule TAKE 1 CAPSULE BY MOUTH TWO TIMES A DAY  ? Evolocumab (REPATHA SURECLICK) 001 MG/ML SOAJ Inject 1 pen into the skin every 14 (fourteen) days.  ? gabapentin (NEURONTIN) 300 MG capsule TAKE ONE CAPSULE EVERY MORNING AND 2 CAPSULES AT BEDTIME (Patient taking differently: Take 300 mg by mouth at bedtime.)  ? losartan (COZAAR) 25 MG tablet Take 2 tablets (50 mg total) by mouth daily.  ? Multiple Vitamin (MULTIVITAMIN WITH MINERALS) TABS tablet Take 1 tablet by mouth daily.  ? oxymetazoline (AFRIN) 0.05 % nasal spray Place 1 spray into both nostrils 2 (two) times daily as needed for congestion.  ? pantoprazole (PROTONIX) 40 MG tablet Take 1 tablet by mouth once daily (Patient taking differently: Take 40 mg by mouth at bedtime.)  ? rosuvastatin (CRESTOR) 5 MG tablet Take 1 tablet (5 mg total) by mouth 3 (three) times a week.  ? sertraline (ZOLOFT) 100 MG tablet Take 1 tablet by mouth once daily  ? traMADol (ULTRAM) 50 MG tablet TAKE 1 TABLET BY MOUTH THREE TIMES DAILY AS NEEDED FOR  KNEE  PAIN  ? vitamin B-12 (CYANOCOBALAMIN) 500 MCG tablet Take 500 mcg by mouth daily.   ?  ? ?Allergies:   Sulfa antibiotics, Nexletol [bempedoic acid], Tricor [fenofibrate], Zetia [ezetimibe], Contrast media [iodinated contrast media], Lipitor [atorvastatin], and Pravastatin  ? ?Social History  ? ?Socioeconomic History  ? Marital status: Married  ?  Spouse name: [name]  ? Number of children: 2  ? Years of education: Not on file  ? Highest education level: Not on file  ?Occupational History  ? Occupation: retired   ?Tobacco Use  ? Smoking status: Former  ?  Packs/day: 0.25  ?  Years: 3.00  ?  Pack years: 0.75  ?  Types: Cigarettes  ?  Quit date: 55  ?  Years since quitting: 33.2  ? Smokeless tobacco: Never  ? Tobacco comments:  ?  minimal smoking history  ?Vaping Use  ? Vaping Use: Never used  ?Substance and Sexual Activity  ? Alcohol use: No  ?  Alcohol/week: 0.0 standard drinks  ?  Drug use: No  ? Sexual activity: Not Currently  ?Other Topics Concern  ? Not on file  ?Social History Narrative  ? Lives with husband and 2 cats.  2 grown children, 5 grandchildren.  ? Occupation: retired, worked in [employer]  ? Activity: no regular exercise.  Does walk in summer  ? Diet: fruits/vegetables daily, good water.  ? ?Social Determinants of Health  ? ?Financial Resource Strain: Low Risk   ? Difficulty of Paying Living Expenses: Not very hard  ?Food Insecurity: No Food Insecurity  ? Worried About Charity fundraiser  in the Last Year: Never true  ? Ran Out of Food in the Last Year: Never true  ?Transportation Needs: No Transportation Needs  ? Lack of Transportation (Medical): No  ? Lack of Transportation (Non-Medical): No  ?Physical Activity: Inactive  ? Days of Exercise per Week: 0 days  ? Minutes of Exercise per Session: 0 min  ?Stress: No Stress Concern Present  ? Feeling of Stress : Not at all  ?Social Connections: Moderately Integrated  ? Frequency of Communication with Friends and Family: More than three times a week  ? Frequency of Social Gatherings with Friends and Family: More than three times a week  ? Attends Religious Services: More than 4 times per year  ? Active Member of Clubs or Organizations: No  ? Attends Archivist Meetings: Never  ? Marital Status: Married  ?  ? ?Family History: ?The patient's family history includes CAD (age of onset: 24) in her father; CAD (age of onset: 54) in her mother; Diabetes in her father; Hyperlipidemia in her sister; Hypertension in her mother; Parkinson's disease in her father. There is no history of Cancer, Stroke, Colon cancer, Breast cancer, Colon polyps, Esophageal cancer, Stomach cancer, or Rectal cancer. ? ?ROS:   ?Please see the history of present illness.    ?All other systems reviewed and are negative. ? ?EKGs/Labs/Other Studies Reviewed:   ? ?The following studies were reviewed today: ? ? ?EKG:  The ekg ordered today demonstrates sinus rhythm.   QTc 414 ms. ? ?Recent Labs: ?07/19/2021: TSH 3.93 ?07/26/2021: Magnesium 2.3 ?09/29/2021: ALT 13 ?10/04/2021: BUN 21; Creatinine, Ser 1.29; Hemoglobin 7.9; Platelets 111; Potassium 4.2; Sodium 139  ?Recent Lipid Panel

## 2021-10-11 ENCOUNTER — Other Ambulatory Visit: Payer: Self-pay

## 2021-10-11 ENCOUNTER — Ambulatory Visit (INDEPENDENT_AMBULATORY_CARE_PROVIDER_SITE_OTHER): Payer: Medicare Other | Admitting: Cardiology

## 2021-10-11 ENCOUNTER — Encounter: Payer: Self-pay | Admitting: Cardiology

## 2021-10-11 ENCOUNTER — Other Ambulatory Visit: Payer: Self-pay | Admitting: Family Medicine

## 2021-10-11 VITALS — BP 144/70 | HR 56 | Ht 63.0 in | Wt 144.0 lb

## 2021-10-11 DIAGNOSIS — I4819 Other persistent atrial fibrillation: Secondary | ICD-10-CM

## 2021-10-11 DIAGNOSIS — Z79899 Other long term (current) drug therapy: Secondary | ICD-10-CM

## 2021-10-11 DIAGNOSIS — I7143 Infrarenal abdominal aortic aneurysm, without rupture: Secondary | ICD-10-CM | POA: Diagnosis not present

## 2021-10-11 NOTE — Telephone Encounter (Signed)
Patient returning call.

## 2021-10-11 NOTE — Patient Instructions (Signed)
Medication Instructions:  ?Your physician recommends that you continue on your current medications as directed. Please refer to the Current Medication list given to you today.  ?*If you need a refill on your cardiac medications before your next appointment, please call your pharmacy* ? ? ?Lab Work: ?None ordered today ? ?If you have labs (blood work) drawn today and your tests are completely normal, you will receive your results only by: ?MyChart Message (if you have MyChart) OR ?A paper copy in the mail ?If you have any lab test that is abnormal or we need to change your treatment, we will call you to review the results. ? ? ?Testing/Procedures: ?None ordered today ? ? ?Follow-Up: ?At [facility], you and your health needs are our priority.  As part of our continuing mission to provide you with exceptional heart care, we have created designated Provider Care Teams.  These Care Teams include your primary Cardiologist (physician) and Advanced Practice Providers (APPs -  Physician Assistants and Nurse Practitioners) who all work together to provide you with the care you need, when you need it. ? ?We recommend signing up for the patient portal called "MyChart".  Sign up information is provided on this After Visit Summary.  MyChart is used to connect with patients for Virtual Visits (Telemedicine).  Patients are able to view lab/test results, encounter notes, upcoming appointments, etc.  Non-urgent messages can be sent to your provider as well.   ?To learn more about what you can do with MyChart, go to [email or web address].   ? ?Your next appointment:   ?4 month(s) ? ?The format for your next appointment:   ?In Person ? ?Provider:   ?You will see one of the following Advanced Practice Providers on your designated Care Team:   ?Murray Hodgkins, NP ?Christell Faith, PA-C ?Cadence Kathlen Mody, PA-C ? ?  ? ? ?Other Instructions ? ?

## 2021-10-11 NOTE — Telephone Encounter (Signed)
Called and spoke with pt. Notified of response from [facility] [facility] and info below re running Rx through [facility]. Pt states that she will call the pharmacy and make sure they have this card on file, will verify cost and call me back to update. If able to receive through this insurance will call pharmacy and give copay card info over phone to pharmacy.  ? ?Pt appreciative of information and states she will call back.  ?

## 2021-10-11 NOTE — Telephone Encounter (Signed)
Attempted to call pt's pharmacy. Phone continued to ring without any answer.  ?Attempted to call pt. No answer. Lmtcb.  ? ?

## 2021-10-24 ENCOUNTER — Telehealth: Payer: Self-pay | Admitting: Internal Medicine

## 2021-10-24 NOTE — Telephone Encounter (Signed)
Pt c/o medication issue: ? ?1. Name of Medication:  ?apixaban (ELIQUIS) 5 MG TABS tablet ? ?2. How are you currently taking this medication (dosage and times per day)?  ?As prescribed ? ?3. Are you having a reaction (difficulty breathing--STAT)?  ?No  ? ?4. What is your medication issue?  ? ?Patient is requesting to speak with Jinny Blossom, RN. She states she spoke with her insurance and they advised her she will be in the donut hole within the next month or so. She would like to know if we can offer assistance. ? ?

## 2021-10-25 ENCOUNTER — Other Ambulatory Visit: Payer: Self-pay

## 2021-10-25 DIAGNOSIS — I714 Abdominal aortic aneurysm, without rupture, unspecified: Secondary | ICD-10-CM

## 2021-10-25 MED ORDER — PREDNISONE 50 MG PO TABS: ORAL_TABLET | ORAL | 0 refills | Status: DC

## 2021-10-25 MED ORDER — DIPHENHYDRAMINE HCL 50 MG PO CAPS
ORAL_CAPSULE | ORAL | 0 refills | Status: DC
Start: 2021-10-25 — End: 2022-07-25

## 2021-10-25 NOTE — Telephone Encounter (Signed)
Patient calling back to speak with [facility]. ?

## 2021-10-25 NOTE — Telephone Encounter (Signed)
Spoke with pt. Questions answered. Pt is calling pharmacy now and will call me back to update re insurance coverage.  ?

## 2021-10-25 NOTE — Telephone Encounter (Signed)
See telephone encounter 09/29/21 for further detail.  ?

## 2021-10-25 NOTE — Telephone Encounter (Signed)
Called and spoke with pt this morning. She has not yet spoken with her pharmacy to have them run Rx through [employer]. Pt states she will do that today and will call me back to give update.  ?

## 2021-10-27 NOTE — Telephone Encounter (Signed)
Called pt's pharmacy to verify if [employer] has been tried with Eliquis Rx.  ?Pharmacist verified that cost will be the same for pt with either [place] or [employer] at $141 for 90 day supply.  ? ?Spoke with pt to make aware. Pt appreciative for follow up. Pt states that someone at the pharmacy told her after her next refill she will be in the donut hole. Pt states that she will be able to afford for now at $141/90 day.  ?She will call to let me know if cost changes and unable to afford.  ? ?Also called and spoke with Juliann Pulse from [facility] re pt assistance for Eliquis. Inquired what the amount would be that pt needs to meet of Rx oop cost (3% of gross income). Juliann Pulse confirmed 3% would be $1,253.47.  ? ?Called pt and notified of info above regarding PAF. If pt meets this amount she will let me know and we will resubmit pt assistance application.  ? ?Otherwise, no changes at this time. Pt has Eliquis on hand and will let us know of any changes.  ?

## 2021-11-01 ENCOUNTER — Ambulatory Visit
Admission: RE | Admit: 2021-11-01 | Discharge: 2021-11-01 | Disposition: A | Discharge status: Home / Self Care | Payer: Medicare Other | Source: Ambulatory Visit | Attending: Vascular Surgery | Admitting: Vascular Surgery

## 2021-11-01 DIAGNOSIS — I714 Abdominal aortic aneurysm, without rupture, unspecified: Secondary | ICD-10-CM | POA: Insufficient documentation

## 2021-11-01 DIAGNOSIS — I701 Atherosclerosis of renal artery: Secondary | ICD-10-CM | POA: Diagnosis not present

## 2021-11-01 MED ORDER — IOHEXOL 350 MG/ML SOLN
75.0000 mL | Freq: Once | INTRAVENOUS | Status: AC | PRN
  Administered 2021-11-01: 75 mL via INTRAVENOUS

## 2021-11-07 ENCOUNTER — Other Ambulatory Visit: Payer: Self-pay | Admitting: Family Medicine

## 2021-11-08 ENCOUNTER — Encounter: Payer: Self-pay | Admitting: Vascular Surgery

## 2021-11-08 ENCOUNTER — Ambulatory Visit (INDEPENDENT_AMBULATORY_CARE_PROVIDER_SITE_OTHER): Payer: Medicare Other | Admitting: Vascular Surgery

## 2021-11-08 VITALS — BP 144/68 | HR 52 | Temp 98.0°F | Resp 20 | Ht 63.0 in | Wt 144.0 lb

## 2021-11-08 DIAGNOSIS — I714 Abdominal aortic aneurysm, without rupture, unspecified: Secondary | ICD-10-CM

## 2021-11-08 NOTE — Progress Notes (Deleted)
? ?Patient ID: Ann White, female   DOB: [birth year], 76 y.o.   MRN: [id number] ? ?Reason for Consult: Routine Post Op ?  ?Referred by Ria Bush, MD ? ?Subjective:  ?   ?HPI: ? ?Ann White is a 76 y.o. female *** ? ?Past Medical History:  ?Diagnosis Date  ? Allergy   ? Anxiety   ? Arthritis   ? Carotid stenosis   ? a. 02/2016 Carotid U/S: Bilateral 40-59%; b. 03/2017 Carotid U/S: RICA 01-60%, LICA 1-09%; c. 32/3557 Carotid U/S: bilat 40-59% ICA dzs.  ? Cervical spondylosis   ? s/p spine injections  ? CKD (chronic kidney disease), stage III ([place])   ? Depression   ? DVT (deep venous thrombosis) ([place])   ? RLE DVT 05/27/15  ? Dysrhythmia   ? afib  ? Ectatic abdominal aorta ([place]) 2016  ? rpt [place] 5 yrs  ? GERD (gastroesophageal reflux disease)   ? History of chicken pox   ? History of diverticulitis of colon   ? HLD (hyperlipidemia)   ? HTN (hypertension)   ? Irritable bowel syndrome with constipation   ? Lichen sclerosus et atrophicus   ? NICM (nonischemic cardiomyopathy) ([place])   ? a. 04/2017 Echo: EF 35-40%, diff HK; b. 05/2017 MV: EF 56%, no ischemia/infarct; c. 04/2018 Echo: EF 55-60%, no rwma, Gr2 DD; d. 03/2020 Echo: EF 40-45%, nl PASP, mod dil LA, mild to mod dil RA, Triv MR.  ? Obesity   ? Persistent atrial fibrillation ([place])   ? a. Dx 03/2020. CHA2DS2VASc = 5-->xarelto '15mg'$  daily.  ? Premature atrial contraction   ? a. 04/2017 48hr Holter: Predominant rhythm - sinus. Rare PVC's, freq PAC's with freq atrial runs up to 34 beats-->metoprolol started.  ? Seasonal allergic rhinitis   ? ?Family History  ?Problem Relation Age of Onset  ? CAD Mother 26  ?     MI  ? Hypertension Mother   ? Parkinson's disease Father   ? Diabetes Father   ? CAD Father 73  ?     MI  ? Hyperlipidemia Sister   ? Cancer Neg Hx   ? Stroke Neg Hx   ? Colon cancer Neg Hx   ? Breast cancer Neg Hx   ? Colon polyps Neg Hx   ? Esophageal cancer Neg Hx   ? Stomach cancer Neg Hx   ? Rectal cancer Neg Hx   ? ?Past Surgical History:   ?Procedure Laterality Date  ? ABDOMINAL AORTIC ANEURYSM REPAIR  09/2021  ? Donzetta Matters  ? ABDOMINAL AORTIC ENDOVASCULAR STENT GRAFT  10/03/2021  ? Procedure: ABDOMINAL AORTIC ENDOVASCULAR STENT GRAFT;  Surgeon: Waynetta Sandy, MD;  Location: [place];  Service: Vascular;;  ? ABDOMINAL HYSTERECTOMY    ? bladder tack  1990s  ? BREAST BIOPSY Right 03/05/2012  ?  benign  ? CARDIOVERSION N/A 05/17/2020  ? Procedure: CARDIOVERSION;  Surgeon: Nelva Bush, MD;  Location: [facility];  Service: Cardiovascular;  Laterality: N/A;  ? CARPAL TUNNEL RELEASE  2004, 2013  ? bilateral  ? CHOLECYSTECTOMY  1990s  ? COLONOSCOPY  2002  ? COLONOSCOPY  06/2013  ? mod diverticulosis, 3 tubular adenomas, int hem rpt 5 yrs [facility])  ? COLONOSCOPY  06/2018  ? TA,c olonic angiodysplastic lesion, mod diverticulosis, rpt 5 yrs [facility] Plan)  ? DEXA  07/2011  ? normal, T score -0.3  ? ESOPHAGOGASTRODUODENOSCOPY ENDOSCOPY  2004  ? normal Fuller Plan)  ? TOTAL ABDOMINAL HYSTERECTOMY W/ BILATERAL SALPINGOOPHORECTOMY  1990s  ?  complete, dysmenorrhea and fibroids  ? ULTRASOUND GUIDANCE FOR VASCULAR ACCESS  10/03/2021  ? Procedure: ULTRASOUND GUIDANCE FOR VASCULAR ACCESS;  Surgeon: Waynetta Sandy, MD;  Location: [place];  Service: Vascular;;  ? ? ?Short Social History:  ?Social History  ? ?Tobacco Use  ? Smoking status: Former  ?  Packs/day: 0.25  ?  Years: 3.00  ?  Pack years: 0.75  ?  Types: Cigarettes  ?  Quit date: 55  ?  Years since quitting: 33.3  ? Smokeless tobacco: Never  ? Tobacco comments:  ?  minimal smoking history  ?Substance Use Topics  ? Alcohol use: No  ?  Alcohol/week: 0.0 standard drinks  ? ? ?Allergies  ?Allergen Reactions  ? Sulfa Antibiotics Swelling  ?  Oral swelling  ? Nexletol [Bempedoic Acid]   ?  Dizziness, "eyes felt funny"  ? Tricor [Fenofibrate] Other (See Comments)  ?  myalgias  ? Zetia [Ezetimibe] Other (See Comments)  ?  Dizziness  ? Contrast Media [Iodinated Contrast Media] Other (See Comments)  ?  Oral contrast  caused mouth blisters  ? Lipitor [Atorvastatin] Other (See Comments)  ?  myalgias  ? Pravastatin Other (See Comments)  ?  myalgias  ? ? ?Current Outpatient Medications  ?Medication Sig Dispense Refill  ? acetaminophen (TYLENOL) 500 MG tablet Take 500-1,000 mg by mouth every 6 (six) hours as needed (knee pain.).    ? apixaban (ELIQUIS) 5 MG TABS tablet Take 1 tablet (5 mg total) by mouth 2 (two) times daily. 180 tablet 3  ? cetirizine (ZYRTEC) 10 MG tablet Take 10 mg by mouth daily as needed for allergies.    ? Cholecalciferol (VITAMIN D3) 1000 UNITS CAPS Take 1 capsule (1,000 Units total) by mouth daily.    ? clonazePAM (KLONOPIN) 1 MG tablet Take 1 tablet by mouth twice daily as needed (Patient taking differently: Take 0.5 mg by mouth at bedtime.) 60 tablet 0  ? diphenhydrAMINE (BENADRYL) 50 MG capsule Take 50 mg by mouth 1 hour prior to your procedure. 1 capsule 0  ? docusate sodium (COLACE) 100 MG capsule Take 100-200 mg by mouth daily as needed (constipation.).    ? dofetilide (TIKOSYN) 125 MCG capsule TAKE 1 CAPSULE BY MOUTH TWO TIMES A DAY 180 capsule 3  ? Evolocumab (REPATHA SURECLICK) 638 MG/ML SOAJ Inject 1 pen into the skin every 14 (fourteen) days. 6 mL 3  ? gabapentin (NEURONTIN) 300 MG capsule TAKE ONE CAPSULE EVERY MORNING AND 2 CAPSULES AT BEDTIME (Patient taking differently: Take 300 mg by mouth at bedtime.) 270 capsule 2  ? losartan (COZAAR) 25 MG tablet Take 2 tablets (50 mg total) by mouth daily. 180 tablet 3  ? Multiple Vitamin (MULTIVITAMIN WITH MINERALS) TABS tablet Take 1 tablet by mouth daily.    ? oxymetazoline (AFRIN) 0.05 % nasal spray Place 1 spray into both nostrils 2 (two) times daily as needed for congestion.    ? pantoprazole (PROTONIX) 40 MG tablet Take 1 tablet by mouth once daily 90 tablet 3  ? predniSONE (DELTASONE) 50 MG tablet One tablet ('50mg'$ ) 13 hours prior to procedure; one tablet ('50mg'$ ) 7 hours prior to procedure and then one tablet (50 mg) one hour prior to procedure. 3  tablet 0  ? rosuvastatin (CRESTOR) 5 MG tablet Take 1 tablet (5 mg total) by mouth 3 (three) times a week. 36 tablet 3  ? sertraline (ZOLOFT) 100 MG tablet Take 1 tablet by mouth once daily 90 tablet 0  ? traMADol (  ULTRAM) 50 MG tablet TAKE 1 TABLET BY MOUTH THREE TIMES DAILY AS NEEDED FOR  KNEE  PAIN 10 tablet 0  ? vitamin B-12 (CYANOCOBALAMIN) 500 MCG tablet Take 500 mcg by mouth daily.     ? ?No current facility-administered medications for this visit.  ? ? ?REVIEW OF SYSTEMS  ? ?   ?Objective:  ?Objective  ? ?Vitals:  ? 11/08/21 1021  ?BP: (!) 144/68  ?Pulse: (!) 52  ?Resp: 20  ?Temp: 98 ?F (36.7 ?C)  ?SpO2: 99%  ?Weight: 144 lb (65.3 kg)  ?Height: '5\' 3"'$  (1.6 m)  ? ?Body mass index is 25.51 kg/m?. ? ?Physical Exam ? ?Data: ?IMPRESSION: ?VASCULAR ?  ?1. Status post aortobi-iliac endograft repair of infrarenal ?abdominal aortic aneurysm. Type 2 endoleaks arising from the ?inferior mesenteric and left L4 lumbar arteries. Unchanged aneurysm ?sac size. ?2. Unchanged moderate ostial stenosis of the left renal artery. ?3.  Aortic Atherosclerosis (ICD10-I70.0). ?  ?NON-VASCULAR ?  ?1. No acute abdominopelvic abnormality. ?2. Diverticulosis. ?3. Unchanged diffuse renal cortical thinning. ?4. Unchanged multilevel degenerative changes of the visualized ?thoracolumbar spine. ?  ?    ?Assessment/Plan:  ?  ? ?*** ? ?  ? ?Waynetta Sandy MD ?[facility] ? ? ?

## 2021-11-08 NOTE — Progress Notes (Signed)
?  ? ?  Subjective:  ?  ? Patient ID: Ann White, female   DOB: [birth year], 76 y.o.   MRN: [id number] ? ?HPI 76 year old female recently underwent endovascular aneurysm repair.  She has recovered well other than feeling mild fatigue after the surgery.  She has no complaints related to today's visit.  CT was performed prior to this evaluation. ? ?Review of Systems ?No complaints today ?   ?Objective:  ? Physical Exam ?Vitals:  ? 11/08/21 1021  ?BP: (!) 144/68  ?Pulse: (!) 52  ?Resp: 20  ?Temp: 98 ?F (36.7 ?C)  ?SpO2: 99%  ? ?Awake alert oriented ?Nonlabored respirations ?Bilateral groins are soft ?Bilateral popliteal pulses are palpable ? ?CTA IMPRESSION: ?VASCULAR ?  ?1. Status post aortobi-iliac endograft repair of infrarenal ?abdominal aortic aneurysm. Type 2 endoleaks arising from the ?inferior mesenteric and left L4 lumbar arteries. Unchanged aneurysm ?sac size. ?2. Unchanged moderate ostial stenosis of the left renal artery. ?3.  Aortic Atherosclerosis (ICD10-I70.0). ?  ?NON-VASCULAR ?  ?1. No acute abdominopelvic abnormality. ?2. Diverticulosis. ?3. Unchanged diffuse renal cortical thinning. ?4. Unchanged multilevel degenerative changes of the visualized ?thoracolumbar spine. ?   ?Assessment:  ?   ?76 year old female status post endovascular aneurysm repair now recovering well ?   ?Plan:  ?   ?Follow-up in 1 year with post EVAR duplex. ? ?Luana Tatro C. Donzetta Matters, MD ?[facility] ?Office: [phone] ?Pager: [phone] ? ?   ?

## 2021-11-24 ENCOUNTER — Encounter: Payer: Self-pay | Admitting: Internal Medicine

## 2021-11-24 ENCOUNTER — Ambulatory Visit (INDEPENDENT_AMBULATORY_CARE_PROVIDER_SITE_OTHER): Payer: Medicare Other | Admitting: Internal Medicine

## 2021-11-24 VITALS — BP 150/74 | HR 50 | Ht 63.0 in | Wt 143.0 lb

## 2021-11-24 DIAGNOSIS — I428 Other cardiomyopathies: Secondary | ICD-10-CM

## 2021-11-24 DIAGNOSIS — N1832 Chronic kidney disease, stage 3b: Secondary | ICD-10-CM

## 2021-11-24 DIAGNOSIS — I4819 Other persistent atrial fibrillation: Secondary | ICD-10-CM

## 2021-11-24 DIAGNOSIS — Z79899 Other long term (current) drug therapy: Secondary | ICD-10-CM | POA: Diagnosis not present

## 2021-11-24 DIAGNOSIS — E785 Hyperlipidemia, unspecified: Secondary | ICD-10-CM | POA: Diagnosis not present

## 2021-11-24 DIAGNOSIS — I10 Essential (primary) hypertension: Secondary | ICD-10-CM | POA: Diagnosis not present

## 2021-11-24 DIAGNOSIS — I7143 Infrarenal abdominal aortic aneurysm, without rupture: Secondary | ICD-10-CM | POA: Diagnosis not present

## 2021-11-24 MED ORDER — LOSARTAN POTASSIUM 100 MG PO TABS: 100.0000 mg | ORAL_TABLET | Freq: Every day | ORAL | 3 refills | Status: DC

## 2021-11-24 NOTE — Progress Notes (Signed)
? ?Follow-up Outpatient Visit ?Date: 11/24/2021 ? ?Primary Care Provider: ?Ria Bush, MD ?[place] ?[place] [address] ? ?Chief Complaint: Follow-up cardiomyopathy, atrial fibrillation, and hypertension ? ?HPI:  Ann White is a 76 y.o. female with history of nonischemic cardiomyopathy diagnosed in 04/2017 (MPI without perfusion defects LVEF as low as 35-40% but normalized on repeat echo in 04/2018), persistent atrial fibrillation diagnosed in 03/2020, carotid artery stenosis, abdominal aortic aneurysm, right calf DVT, hypertension, hyperlipidemia, and chronic kidney disease, who presents for follow-up of cardiomyopathy.  I last saw her in March in anticipation of EVAR with Dr. Donzetta Matters.  She was feeling well at that time.  No medication changes or additional testing were pursued.  EVAR was performed on 10/03/2021.  Dofetilide was briefly held due to QT prolongation early in her hospital course.  Has she subsequently saw Dr. Quentin Ore on 10/11/2021, at which time QTc was acceptable.  She was advised to continue dofetilide 125 mcg twice daily and apixaban 5 mg twice daily. ? ?Today, Ms. Geisel reports that she has recovered well from her EVAR in March.  Her only complaint today is of soreness in the back of both thighs for the last week.  She believes that weed eating may have causes this, as she has been doing more work around the house due to her husband's medical problems.  She denies pain in the anterior thighs, groins, or calves.  She also denies chest pain, shortness of breath, palpitations, and lightheadedness.  She remains compliant with apixaban and dofetilide and has not noticed any significant bleeding. ? ?-------------------------------------------------------------------------------------------------- ? ?Cardiovascular History & Procedures: ?Cardiovascular Problems: ?Persistent atrial fibrillation ?Nonischemic cardiomyopathy ?Carotid artery stenosis ?  ?Risk Factors: ?Hypertension,  hyperlipidemia, and age greater than 63 ?  ?Cath/PCI: ?None ?  ?CV Surgery: ?None ?  ?EP Procedures and Devices: ?DCCV (05/17/20) ?48 hour Holter monitor (04/29/17): Sinus rhythm with frequent PACs and atrial runs, lasting up to 34 beats. ?  ?Non-Invasive Evaluation(s): ?ABIs (08/30/2021): Normal bilateral ABIs/TBI's. ?Carotid Doppler (08/15/2021): Moderate bilateral ICA stenoses of 40-59%.  Greater than 50% stenoses in both ECAs.  Antegrade vertebral artery flow.  Disturbed flow in both subclavian arteries. ?AAA duplex (08/15/2021): Abnormal dilation of the mid and distal abdominal aorta measuring up to 5.1 cm. ?TTE (09/20/2020): Normal LV size wall thickness.  LVEF 60-65% with normal wall motion.  Grade 1 diastolic dysfunction.  Normal RV size and function.  Severe left and mild to moderate right atrial enlargement.  Trivial mitral and mild tricuspid regurgitation. ?Carotid Doppler (08/15/2020): 40-59% stenoses in both carotid arteries.  Greater than 50% stenosis of right external carotid artery.  Antegrade vertebral artery flow. ?TTE (04/14/2020): Normal LV size and wall thickness.  LVEF 40-45% with global hypokinesis.  Normal RV size and function.  Moderate left atrial and mild to moderate right atrial enlargement.  Trivial mitral and mild tricuspid regurgitation. ?Carotid Doppler (04/17/2019): 40-59% stenosis in the carotid arteries bilaterally.  Antegrade flow in the vertebral arteries.  Normal subclavian flow. ?TTE (04/24/18): Normal LV size.  LVEF 55-60% with normal wall motion.  Grade 2 diastolic dysfunction.  Mild left atrial enlargement.  Normal RV size and function.  Mild pulmonary hypertension (PASP 41 mmHg). ?Exercise MPI (06/12/17): No evidence of ischemia. LVEF 56%. Hypertensive blood pressure response to exercise. ?Echo (04/29/17): Normal LV size with moderately reduced contraction. LVEF 35-40% with global hypokinesis. Mild MR. Mild left atrial enlargement. Normal RV size and function. Normal PA  pressure. ?Carotid Doppler (03/25/17): Heterogeneous plaque, bilaterally.  Stable, 40-59% RICA stenosis. RXVQMG,8-67% LICA stenosis. Stable, >50% RECA stenosis. Patent vertebral arteries with antegrade flow. Normal subclavian arteries, bilaterally. ? ?Recent CV Pertinent Labs: ?Lab Results  ?Component Value Date  ? CHOL 132 06/23/2021  ? CHOL 215 (H) 03/24/2021  ? HDL 59 06/23/2021  ? HDL 52 03/24/2021  ? [place] 52 06/23/2021  ? LDLCALC 132 (H) 03/24/2021  ? LDLDIRECT 132.0 03/18/2019  ? TRIG 103 06/23/2021  ? CHOLHDL 2.2 06/23/2021  ? INR 1.4 (H) 09/29/2021  ? K 4.2 10/04/2021  ? MG 2.3 07/26/2021  ? BUN 21 10/04/2021  ? BUN 32 (H) 09/29/2021  ? CREATININE 1.29 (H) 10/04/2021  ? CREATININE 1.19 07/26/2011  ? ? ?Past medical and surgical history were reviewed and updated in EPIC. ? ?Current Meds  ?Medication Sig  ? acetaminophen (TYLENOL) 500 MG tablet Take 500-1,000 mg by mouth every 6 (six) hours as needed (knee pain.).  ? apixaban (ELIQUIS) 5 MG TABS tablet Take 1 tablet (5 mg total) by mouth 2 (two) times daily.  ? cetirizine (ZYRTEC) 10 MG tablet Take 10 mg by mouth daily as needed for allergies.  ? Cholecalciferol (VITAMIN D3) 1000 UNITS CAPS Take 1 capsule (1,000 Units total) by mouth daily.  ? clonazePAM (KLONOPIN) 1 MG tablet Take 1 tablet by mouth twice daily as needed  ? diphenhydrAMINE (BENADRYL) 50 MG capsule Take 50 mg by mouth 1 hour prior to your procedure.  ? docusate sodium (COLACE) 100 MG capsule Take 100-200 mg by mouth daily as needed (constipation.).  ? dofetilide (TIKOSYN) 125 MCG capsule TAKE 1 CAPSULE BY MOUTH TWO TIMES A DAY  ? Evolocumab (REPATHA SURECLICK) 619 MG/ML SOAJ Inject 1 pen into the skin every 14 (fourteen) days.  ? gabapentin (NEURONTIN) 300 MG capsule Take 300 mg by mouth at bedtime.  ? losartan (COZAAR) 25 MG tablet Take 2 tablets (50 mg total) by mouth daily.  ? Multiple Vitamin (MULTIVITAMIN WITH MINERALS) TABS tablet Take 1 tablet by mouth daily.  ? oxymetazoline (AFRIN)  0.05 % nasal spray Place 1 spray into both nostrils 2 (two) times daily as needed for congestion.  ? pantoprazole (PROTONIX) 40 MG tablet Take 1 tablet by mouth once daily  ? rosuvastatin (CRESTOR) 5 MG tablet Take 1 tablet (5 mg total) by mouth 3 (three) times a week.  ? sertraline (ZOLOFT) 100 MG tablet Take 1 tablet by mouth once daily  ? traMADol (ULTRAM) 50 MG tablet TAKE 1 TABLET BY MOUTH THREE TIMES DAILY AS NEEDED FOR  KNEE  PAIN  ? vitamin B-12 (CYANOCOBALAMIN) 500 MCG tablet Take 500 mcg by mouth daily.   ? ? ?Allergies: Sulfa antibiotics, Nexletol [bempedoic acid], Tricor [fenofibrate], Zetia [ezetimibe], Contrast media [iodinated contrast media], Lipitor [atorvastatin], and Pravastatin ? ?Social History  ? ?Tobacco Use  ? Smoking status: Former  ?  Packs/day: 0.25  ?  Years: 3.00  ?  Pack years: 0.75  ?  Types: Cigarettes  ?  Quit date: 67  ?  Years since quitting: 33.3  ? Smokeless tobacco: Never  ? Tobacco comments:  ?  minimal smoking history  ?Vaping Use  ? Vaping Use: Never used  ?Substance Use Topics  ? Alcohol use: No  ?  Alcohol/week: 0.0 standard drinks  ? Drug use: No  ? ? ?Family History  ?Problem Relation Age of Onset  ? CAD Mother 55  ?     MI  ? Hypertension Mother   ? Parkinson's disease Father   ? Diabetes Father   ?  CAD Father 55  ?     MI  ? Hyperlipidemia Sister   ? Cancer Neg Hx   ? Stroke Neg Hx   ? Colon cancer Neg Hx   ? Breast cancer Neg Hx   ? Colon polyps Neg Hx   ? Esophageal cancer Neg Hx   ? Stomach cancer Neg Hx   ? Rectal cancer Neg Hx   ? ? ?Review of Systems: ?A 12-system review of systems was performed and was negative except as noted in the HPI. ? ?-------------------------------------------------------------------------------------------------- ? ?Physical Exam: ?BP (!) 150/74 (BP Location: Left Arm, Patient Position: Sitting, Cuff Size: Normal)   Pulse (!) 50   Ht '5\' 3"'$  (1.6 m)   Wt 143 lb (64.9 kg)   SpO2 98%   BMI 25.33 kg/m?  ? ?General:  NAD. ?Neck: No JVD  or HJR. ?Lungs: Clear to auscultation bilaterally without wheezes or crackles. ?Heart: Regular rate and rhythm without murmurs, rubs, or gallops. ?Abdomen: Soft, nontender, nondistended. ?Extremities: No lower extremity edema. 2+ P

## 2021-11-24 NOTE — Patient Instructions (Signed)
Medication Instructions:  ?Your physician has recommended you make the following change in your medication:  ? ?INCREASE losartan (Cozaar) to 100 mg daily  ? ?Medication Samples have been provided to the patient. ? ?Drug name: Eliquis        ?Strength: '5mg'$          ?Qty: 14 ?LOT: [id number]   ?Exp.Date: 3/25 ? ?Dosing instructions: Take 1 tablet by mouth 2 times daily  ? ?The patient has been instructed regarding the correct time, dose, and frequency of taking this medication, including desired effects and most common side effects.  ? ?Ann White ?11:37 AM ?11/24/2021  ? ?*If you need a refill on your cardiac medications before your next appointment, please call your pharmacy* ? ? ?Lab Work: ?Your physician recommends that you return for lab work (BMP) in: 2 weeks ? ?[facility] Mall Entrance at [facility] ?1st desk on the right to check in, past the screening table ?Lab hours: Monday- Friday (7:30 am- 5:30 pm)  ? ? ?If you have labs (blood work) drawn today and your tests are completely normal, you will receive your results only by: ?MyChart Message (if you have MyChart) OR ?A paper copy in the mail ?If you have any lab test that is abnormal or we need to change your treatment, we will call you to review the results. ? ? ?Testing/Procedures: ?None ordered ? ? ?Follow-Up: ?At [facility], you and your health needs are our priority.  As part of our continuing mission to provide you with exceptional heart care, we have created designated Provider Care Teams.  These Care Teams include your primary Cardiologist (physician) and Advanced Practice Providers (APPs -  Physician Assistants and Nurse Practitioners) who all work together to provide you with the care you need, when you need it. ? ?We recommend signing up for the patient portal called "MyChart".  Sign up information is provided on this After Visit Summary.  MyChart is used to connect with patients for Virtual Visits (Telemedicine).  Patients are able to view lab/test  results, encounter notes, upcoming appointments, etc.  Non-urgent messages can be sent to your provider as well.   ?To learn more about what you can do with MyChart, go to [email or web address].   ? ?Your next appointment:   ?6 month(s) ? ?The format for your next appointment:   ?In Person ? ?Provider:   ?You may see Nelva Bush, MD or one of the following Advanced Practice Providers on your designated Care Team:   ?Murray Hodgkins, NP ?Christell Faith, PA-C ?Cadence Kathlen Mody, PA-C ? ? ?Other Instructions ?N/A ? ?Important Information About Sugar ? ? ? ? ?  ?

## 2021-11-25 ENCOUNTER — Encounter: Payer: Self-pay | Admitting: Internal Medicine

## 2021-11-27 ENCOUNTER — Other Ambulatory Visit: Payer: Self-pay | Admitting: Internal Medicine

## 2021-12-04 ENCOUNTER — Telehealth: Payer: Self-pay | Admitting: *Deleted

## 2021-12-04 NOTE — Telephone Encounter (Signed)
PA has been submitted via covermymeds. PA response pending.

## 2021-12-05 NOTE — Telephone Encounter (Signed)
Pt c/o medication issue:  1. Name of Medication:  REPATHA SURECLICK 244 MG/ML SOAJ  2. How are you currently taking this medication (dosage and times per day)?   3. Are you having a reaction (difficulty breathing--STAT)?   4. What is your medication issue?    [facility] rep called, stating they received PA, but it is missing required chart notes regarding patient experience.   Fax: [phone] reference#: [id number] Phone: [phone]

## 2021-12-06 NOTE — Telephone Encounter (Signed)
Chart notes/lipid panel faxed to [facility] as requested.

## 2021-12-07 ENCOUNTER — Telehealth: Payer: Self-pay

## 2021-12-07 NOTE — Telephone Encounter (Signed)
Patient is returning call.  °

## 2021-12-07 NOTE — Telephone Encounter (Signed)
Patient receiving grant for [place] through 09/09/22. Praluent preferred drug on patient's formulary.

## 2021-12-07 NOTE — Telephone Encounter (Signed)
Left message requesting to have patient return call RE: PCKS9 inhibitor. [facility] requesting documentation on why she can tolerate Praluent-wouldl ike to discuss this with her.

## 2021-12-07 NOTE — Telephone Encounter (Signed)
Spoke with patient who states she is still receiving her medication free of charge through a grant which does not expire until 2/24. Prior Auth not needed at this time.

## 2021-12-13 ENCOUNTER — Other Ambulatory Visit
Admission: RE | Admit: 2021-12-13 | Discharge: 2021-12-13 | Disposition: A | Discharge status: Home / Self Care | Payer: Medicare Other | Attending: Internal Medicine | Admitting: Internal Medicine

## 2021-12-13 ENCOUNTER — Telehealth: Payer: Self-pay | Admitting: Internal Medicine

## 2021-12-13 DIAGNOSIS — Z79899 Other long term (current) drug therapy: Secondary | ICD-10-CM | POA: Insufficient documentation

## 2021-12-13 DIAGNOSIS — E785 Hyperlipidemia, unspecified: Secondary | ICD-10-CM

## 2021-12-13 LAB — BASIC METABOLIC PANEL
Anion gap: 10 (ref 5–15)
BUN: 25 mg/dL — ABNORMAL HIGH (ref 8–23)
CO2: 27 mmol/L (ref 22–32)
Calcium: 9.5 mg/dL (ref 8.9–10.3)
Chloride: 100 mmol/L (ref 98–111)
Creatinine, Ser: 1.16 mg/dL — ABNORMAL HIGH (ref 0.44–1.00)
GFR, Estimated: 49 mL/min — ABNORMAL LOW (ref >60)
Glucose, Bld: 97 mg/dL (ref 70–99)
Potassium: 4.4 mmol/L (ref 3.5–5.1)
Sodium: 137 mmol/L (ref 135–145)

## 2021-12-13 NOTE — Telephone Encounter (Signed)
Patient dropped off PAF, placed in box °

## 2021-12-13 NOTE — Telephone Encounter (Signed)
Ok to change from [place] to [employer] if her insurance prefers Praluent now. Would recommend the '75mg'$  Q2W dose. This would need a new prior authorization. Her [employer] is still valid, this will cover any lipid med she takes so it will work for [employer] too. She'll just need to make sure the pharmacy runs the same grant info with her Praluent that they had been with her Repatha. Ann White info is below if needed -  CARD NO. [id number]  [place]   PCN [id number]   GROUP [id number]

## 2021-12-13 NOTE — Telephone Encounter (Signed)
Pt dropped off (not PAF forms) but notice of determination from [facility] stating that pt's request for Repatha was denied because the preferred product for the pt's health plan is Praluent.  Previous documentation shows pt was receiving grant for Repatha through 09/09/22.   I called pt to discuss and pt states that she was informed that her grant had "ran out" and she is no longer able to receive Repatha at no cost.   There has been a lot of documentation regarding the two medications and the grant vs need for prior auth, I will forward to PharmD to clarify regarding grant expiration.   If pt no longer able to receive Repatha, will ask Dr. Saunders Revel if ok to change to Praluent.   Pt confirms that she did not have any problems taking Praluent in the past, it was only changed d/t cost issue or insurance coverage.

## 2021-12-14 ENCOUNTER — Telehealth: Payer: Self-pay | Admitting: *Deleted

## 2021-12-14 NOTE — Telephone Encounter (Signed)
Why is pharmacy trying to process Repatha if her insurance is requesting she change to Praluent? Will need prior auth approved for Praluent and rx sent in for that, then pharmacy should run rx through her Part D insurance as primary, and the [employer] as Consulting civil engineer (info below). If there is still a problem with this processing, the retail pharmacy can call the help desk at [phone].

## 2021-12-14 NOTE — Telephone Encounter (Signed)
Attempted to call pt to make aware of the below and that I am currently working on this. No answer. Lmtcb.   Spoke with pharmacist at [facility]), s/w [employer].  Huong states initially the response after trying to process Repatha through [employer] was that grant had expired. After giving updated ID number listed below, another result comes up for pharmacist and unable to process. Two options were given: 1) To bill copay to a third party or 2) Pt does have other coverage but is saying they are not paying for it.   Huong asks that either our PharmD call to assist further or that [facility] is called to clarify which option is needed to process.   Advised that I will forward to PharmD to assist further.

## 2021-12-14 NOTE — Telephone Encounter (Signed)
-----   Message from Nelva Bush, MD sent at 12/13/2021  2:18 PM EDT ----- Please let Ann White know that her kidney function and electrolytes are stable after recent escalation of losartan.  She should continue her current medications and follow-up as discussed at our last office visit.

## 2021-12-14 NOTE — Telephone Encounter (Signed)
Pt is agreeable to change to Praluent as she has taken in the past.  Will verify with Dr. Saunders Revel ok to change from [place] to [place].  Then will have pharmacy process Praluent through [employer].

## 2021-12-14 NOTE — Telephone Encounter (Signed)
It is fine to transition from Repatha to Praluent 75 mg every 2 weeks if this is the preferred agent through her insurance plan.  We should check a lipid panel and ALT about 3 months after the change to make sure her lipids remain adequately controlled.  Nelva Bush, MD [facility]

## 2021-12-14 NOTE — Telephone Encounter (Signed)
Attempted to call pt. No answer. Lmtcb.  

## 2021-12-15 MED ORDER — PRALUENT 75 MG/ML ~~LOC~~ SOAJ: 1.0000 mL | SUBCUTANEOUS | 11 refills | Status: AC

## 2021-12-15 MED ORDER — PRALUENT 75 MG/ML ~~LOC~~ SOAJ
1.0000 mL | SUBCUTANEOUS | 11 refills | Status: DC
Start: 2021-12-15 — End: 2021-12-15

## 2021-12-15 NOTE — Telephone Encounter (Signed)
Prior Authorization sent for:  Ann White [place]: [id number] - Rx #: [id number] Call us at [phone]  Praluent '75MG'$ /ML auto-injectors Form [employer] Electronic PA Form [phone] NCPDP) Original Claim [id number]

## 2021-12-15 NOTE — Telephone Encounter (Signed)
Praluent is preferred but still requires a prior authorization before the pharmacy can fill her rx. Received fax at [address] office about this, will forward to [facility] team who has been managing pt. Key on cover my meds for the request is [id number], or can submit new request as well.

## 2021-12-15 NOTE — Telephone Encounter (Signed)
The patient has been notified of the result and verbalized understanding.  All questions (if any) were answered.     

## 2021-12-15 NOTE — Telephone Encounter (Signed)
Your demographic data has been sent to [facility] successfully!  [employer] typically takes 5-10 minutes to respond, but it may take a little longer in some cases. You will be notified by email when available. You can also check for an update later by opening this request from your dashboard. Please do not fax or call [employer] to resubmit this request. If you need assistance, please chat with CoverMyMeds or call us at [phone].  If it has been longer than 24 hours, please reach out to [place].

## 2021-12-18 ENCOUNTER — Other Ambulatory Visit: Payer: Self-pay | Admitting: Family Medicine

## 2021-12-18 NOTE — Telephone Encounter (Signed)
Per [email or web address] response: Your PA request has been approved.  As long as you remain covered by the [facility] and there are no changes to your plan benefits, this request is approved for the following time period: 12/15/2021 - 12/16/2022  Spoke with Huong at [place] who ran the prescription through the grant and she states it went through.

## 2021-12-18 NOTE — Telephone Encounter (Signed)
Last filled 08-29-21 #60 Last OV 07-19-21 Next Ov 01-30-22 [place]

## 2021-12-19 NOTE — Telephone Encounter (Signed)
ERx 

## 2021-12-24 NOTE — Progress Notes (Unsigned)
    Ann Sherwood T. Alverda Nazzaro, MD, [place] at [facility] [place], [address]  Phone: [phone]  FAX: [place] - 76 y.o. female  MRN [id number]  Date of Birth: [birth year]  Date: 12/25/2021  PCP: Ria Bush, MD  Referral: Ria Bush, MD  No chief complaint on file.  Subjective:   Ann White is a 76 y.o. very pleasant female patient with There is no height or weight on file to calculate BMI. who presents with the following:  She presents in f/u for R sided knee OA and pain.  I have given her intraarticular steroid injections in the past, and I have not seen her since 12/2021.  Her last knee films were from several years ago, but even then she had moderate tricompartmental OA.    Review of Systems is noted in the HPI, as appropriate  Objective:   There were no vitals taken for this visit.  GEN: No acute distress; alert,appropriate. PULM: Breathing comfortably in no respiratory distress PSYCH: Normally interactive.   Laboratory and Imaging Data:  Assessment and Plan:   ***

## 2021-12-25 ENCOUNTER — Ambulatory Visit
Admission: RE | Admit: 2021-12-25 | Discharge: 2021-12-25 | Disposition: A | Discharge status: Home / Self Care | Payer: Medicare Other | Source: Ambulatory Visit | Attending: Family Medicine | Admitting: Family Medicine

## 2021-12-25 ENCOUNTER — Ambulatory Visit (INDEPENDENT_AMBULATORY_CARE_PROVIDER_SITE_OTHER): Payer: Medicare Other | Admitting: Family Medicine

## 2021-12-25 ENCOUNTER — Encounter: Payer: Self-pay | Admitting: Family Medicine

## 2021-12-25 VITALS — BP 118/60 | HR 56 | Temp 98.5°F | Ht 63.0 in | Wt 142.4 lb

## 2021-12-25 DIAGNOSIS — Z780 Asymptomatic menopausal state: Secondary | ICD-10-CM | POA: Diagnosis not present

## 2021-12-25 DIAGNOSIS — Z1231 Encounter for screening mammogram for malignant neoplasm of breast: Secondary | ICD-10-CM

## 2021-12-25 DIAGNOSIS — M1711 Unilateral primary osteoarthritis, right knee: Secondary | ICD-10-CM

## 2021-12-25 MED ORDER — TRIAMCINOLONE ACETONIDE 40 MG/ML IJ SUSP
40.0000 mg | Freq: Once | INTRAMUSCULAR | Status: AC
  Administered 2021-12-25: 40 mg via INTRA_ARTICULAR

## 2021-12-27 ENCOUNTER — Other Ambulatory Visit: Payer: Self-pay | Admitting: Family Medicine

## 2021-12-27 DIAGNOSIS — R928 Other abnormal and inconclusive findings on diagnostic imaging of breast: Secondary | ICD-10-CM

## 2021-12-28 ENCOUNTER — Telehealth: Payer: Self-pay | Admitting: Family Medicine

## 2021-12-28 NOTE — Telephone Encounter (Signed)
Pt is requesting a call back about mammogram results once reviewed. Callback is [phone]

## 2021-12-29 MED ORDER — GABAPENTIN 300 MG PO CAPS: 300.0000 mg | ORAL_CAPSULE | Freq: Every day | ORAL | 1 refills | Status: DC

## 2021-12-29 NOTE — Telephone Encounter (Signed)
Patient called back she wanted refill on gabapentin. States that she takes '300mg'$  at night. I do not see where you have given in the past. Patient is not sure who prescribed but states she has been taking every night. Ok to send refill?   She would like sent to [facility] on [address]

## 2021-12-29 NOTE — Addendum Note (Signed)
Addended by: Ria Bush on: 12/29/2021 05:00 PM   Modules accepted: Orders

## 2021-12-29 NOTE — Telephone Encounter (Addendum)
Gabapentin refilled.  Tried calling patient, unable to reach on home or cell.  Please notify mammogram returned showing possible spot in left breast and radiologist recommends dx mammo/ and maybe [place] too.  I have placed orders - please have her call [facility] [phone] to schedule (if not already contacted by them).

## 2022-01-01 NOTE — Telephone Encounter (Signed)
Lvm asking pt to call back.  Need to relay Dr. G's message.  

## 2022-01-01 NOTE — Telephone Encounter (Signed)
Pt rtn call.  Notified pt refill was sent to [place].  I relayed Dr. Synthia Innocent message, pt verbalizes understanding and will call [facility] today.

## 2022-01-03 ENCOUNTER — Ambulatory Visit
Admission: RE | Admit: 2022-01-03 | Discharge: 2022-01-03 | Disposition: A | Discharge status: Home / Self Care | Payer: Medicare Other | Source: Ambulatory Visit | Attending: Family Medicine | Admitting: Family Medicine

## 2022-01-03 ENCOUNTER — Ambulatory Visit: Payer: Medicare Other

## 2022-01-03 DIAGNOSIS — R928 Other abnormal and inconclusive findings on diagnostic imaging of breast: Secondary | ICD-10-CM | POA: Diagnosis not present

## 2022-01-30 ENCOUNTER — Ambulatory Visit (INDEPENDENT_AMBULATORY_CARE_PROVIDER_SITE_OTHER): Payer: Medicare Other | Admitting: Family Medicine

## 2022-01-30 ENCOUNTER — Encounter: Payer: Self-pay | Admitting: Family Medicine

## 2022-01-30 VITALS — BP 122/68 | HR 54 | Temp 97.5°F | Ht 63.0 in | Wt 145.4 lb

## 2022-01-30 DIAGNOSIS — E7849 Other hyperlipidemia: Secondary | ICD-10-CM | POA: Diagnosis not present

## 2022-01-30 DIAGNOSIS — I4819 Other persistent atrial fibrillation: Secondary | ICD-10-CM | POA: Diagnosis not present

## 2022-01-30 DIAGNOSIS — Z95828 Presence of other vascular implants and grafts: Secondary | ICD-10-CM | POA: Diagnosis not present

## 2022-01-30 DIAGNOSIS — I7143 Infrarenal abdominal aortic aneurysm, without rupture: Secondary | ICD-10-CM | POA: Diagnosis not present

## 2022-01-30 NOTE — Progress Notes (Signed)
Patient ID: Ann White, female    DOB: 07-Jun-1946, 76 y.o.   MRN: [id number]  This visit was conducted in person.  BP 122/68   Pulse (!) 54   Temp (!) 97.5 F (36.4 C) (Temporal)   Ht '5\' 3"'$  (1.6 m)   Wt 145 lb 6 oz (65.9 kg)   SpO2 97%   BMI 25.75 kg/m    CC: 6 mo f/u visit  Subjective:   HPI: Ann White is a 76 y.o. female presenting on 01/30/2022 for Follow-up (Here for f/u. )   Regularly sees Dr Quentin Ore for atrial fibrillation on Eliquis and Tikosyn, as well as Dr. Saunders Revel for nonischemic cardiomyopathy and lipid clinic for familial hyperlipidemia on Repatha and Crestor 5 mg 3 times a week.  Recently found to have AAA, status post endovascular aneurysm repair regularly seeing [facility] Dr. Donzetta Matters.   Saw Dr. Edilia Bo sports medicine last month with right knee arthritis status post steroid injection  Notes some memory difficulty - forgetting names. Doesn't get lost while driving. Doesn't repeat questions. Continues regular b12.    DEXA 12/2021 - T -0.8 L femur neck  L dx mammo 12/2021 - reassuring, rpt screen in 1 year.      Relevant past medical, surgical, family and social history reviewed and updated as indicated. Interim medical history since our last visit reviewed. Allergies and medications reviewed and updated. Outpatient Medications Prior to Visit  Medication Sig Dispense Refill   acetaminophen (TYLENOL) 500 MG tablet Take 500-1,000 mg by mouth every 6 (six) hours as needed (knee pain.).     apixaban (ELIQUIS) 5 MG TABS tablet Take 1 tablet (5 mg total) by mouth 2 (two) times daily. 180 tablet 3   cetirizine (ZYRTEC) 10 MG tablet Take 10 mg by mouth daily as needed for allergies.     Cholecalciferol (VITAMIN D3) 1000 UNITS CAPS Take 1 capsule (1,000 Units total) by mouth daily.     clonazePAM (KLONOPIN) 1 MG tablet Take 1 tablet by mouth twice daily as needed 60 tablet 0   diphenhydrAMINE (BENADRYL) 50 MG capsule Take 50 mg by mouth 1 hour prior to your  procedure. 1 capsule 0   docusate sodium (COLACE) 100 MG capsule Take 100-200 mg by mouth daily as needed (constipation.).     dofetilide (TIKOSYN) 125 MCG capsule TAKE 1 CAPSULE BY MOUTH TWO TIMES A DAY 180 capsule 3   gabapentin (NEURONTIN) 300 MG capsule Take 1 capsule (300 mg total) by mouth at bedtime. 90 capsule 1   losartan (COZAAR) 100 MG tablet Take 1 tablet (100 mg total) by mouth daily. 90 tablet 3   Multiple Vitamin (MULTIVITAMIN WITH MINERALS) TABS tablet Take 1 tablet by mouth daily.     oxymetazoline (AFRIN) 0.05 % nasal spray Place 1 spray into both nostrils 2 (two) times daily as needed for congestion.     pantoprazole (PROTONIX) 40 MG tablet Take 1 tablet by mouth once daily 90 tablet 3   rosuvastatin (CRESTOR) 5 MG tablet Take 1 tablet (5 mg total) by mouth 3 (three) times a week. 36 tablet 3   sertraline (ZOLOFT) 100 MG tablet Take 1 tablet by mouth once daily 90 tablet 3   traMADol (ULTRAM) 50 MG tablet TAKE 1 TABLET BY MOUTH THREE TIMES DAILY AS NEEDED FOR  KNEE  PAIN 10 tablet 0   vitamin B-12 (CYANOCOBALAMIN) 500 MCG tablet Take 500 mcg by mouth daily.      No facility-administered medications prior to  visit.     Per HPI unless specifically indicated in ROS section below Review of Systems  Objective:  BP 122/68   Pulse (!) 54   Temp (!) 97.5 F (36.4 C) (Temporal)   Ht '5\' 3"'$  (1.6 m)   Wt 145 lb 6 oz (65.9 kg)   SpO2 97%   BMI 25.75 kg/m   Wt Readings from Last 3 Encounters:  01/30/22 145 lb 6 oz (65.9 kg)  12/25/21 142 lb 7 oz (64.6 kg)  11/24/21 143 lb (64.9 kg)      Physical Exam Vitals and nursing note reviewed.  Constitutional:      Appearance: Normal appearance. She is not ill-appearing.  HENT:     Mouth/Throat:     Mouth: Mucous membranes are moist.     Pharynx: Oropharynx is clear. No oropharyngeal exudate or posterior oropharyngeal erythema.  Cardiovascular:     Rate and Rhythm: Normal rate and regular rhythm.     Pulses: Normal pulses.      Heart sounds: Normal heart sounds. No murmur heard.    Comments: Sounds regular Pulmonary:     Effort: Pulmonary effort is normal. No respiratory distress.     Breath sounds: Normal breath sounds. No wheezing, rhonchi or rales.  Musculoskeletal:     Right lower leg: No edema.     Left lower leg: No edema.  Neurological:     Mental Status: She is alert.  Psychiatric:        Mood and Affect: Mood normal.        Behavior: Behavior normal.        Assessment & Plan:   Problem List Items Addressed This Visit     Familial combined hyperlipidemia    Continues crestor MWF and repatha. Appreciate lipid clinic care.       Infrarenal abdominal aortic aneurysm (AAA) without rupture [facility])    S/p EVAR, now sees [facility] yearly.      Persistent atrial fibrillation (HCC)    Continues eliquis and tikosyn through EP.       History of repair of aneurysm of abdominal aorta using endovascular stent graft - Primary     No orders of the defined types were placed in this encounter.  No orders of the defined types were placed in this encounter.    Patient Instructions  Good to see you today You are doing well today Return in 6 months for physical.   Follow up plan: Return in about 6 months (around 08/02/2022) for annual exam, prior fasting for blood work, medicare wellness visit.  Ann Bush, MD

## 2022-01-30 NOTE — Assessment & Plan Note (Signed)
S/p EVAR, now sees [facility] yearly.

## 2022-01-30 NOTE — Assessment & Plan Note (Signed)
Continues eliquis and tikosyn through EP.

## 2022-01-30 NOTE — Assessment & Plan Note (Signed)
Continues crestor MWF and repatha. Appreciate lipid clinic care.

## 2022-01-30 NOTE — Patient Instructions (Addendum)
Good to see you today You are doing well today Return in 6 months for physical.

## 2022-02-03 ENCOUNTER — Other Ambulatory Visit: Payer: Self-pay | Admitting: Family Medicine

## 2022-02-06 NOTE — Telephone Encounter (Signed)
Name of Medication: Clonazepam Name of Pharmacy: [facility] or Written Date and Quantity: 12/19/21, #60 Last Office Visit and Type: 01/30/22, f/u Next Office Visit and Type: 07/23/22, CPE Last Controlled Substance Agreement Date: 03/16/15 Last UDS: 03/16/15

## 2022-02-07 NOTE — Telephone Encounter (Signed)
ERx 

## 2022-02-16 ENCOUNTER — Other Ambulatory Visit: Payer: Self-pay | Admitting: Internal Medicine

## 2022-02-21 ENCOUNTER — Other Ambulatory Visit
Admission: RE | Admit: 2022-02-21 | Discharge: 2022-02-21 | Disposition: A | Discharge status: Home / Self Care | Payer: Medicare Other | Source: Ambulatory Visit | Attending: Cardiology | Admitting: Cardiology

## 2022-02-21 ENCOUNTER — Encounter: Payer: Self-pay | Admitting: Cardiology

## 2022-02-21 ENCOUNTER — Ambulatory Visit (INDEPENDENT_AMBULATORY_CARE_PROVIDER_SITE_OTHER): Payer: Medicare Other | Admitting: Cardiology

## 2022-02-21 VITALS — BP 140/60 | HR 49 | Ht 63.0 in | Wt 147.0 lb

## 2022-02-21 DIAGNOSIS — I4819 Other persistent atrial fibrillation: Secondary | ICD-10-CM

## 2022-02-21 DIAGNOSIS — I428 Other cardiomyopathies: Secondary | ICD-10-CM

## 2022-02-21 DIAGNOSIS — I5022 Chronic systolic (congestive) heart failure: Secondary | ICD-10-CM

## 2022-02-21 DIAGNOSIS — Z79899 Other long term (current) drug therapy: Secondary | ICD-10-CM | POA: Insufficient documentation

## 2022-02-21 LAB — BASIC METABOLIC PANEL
Anion gap: 7 (ref 5–15)
BUN: 24 mg/dL — ABNORMAL HIGH (ref 8–23)
CO2: 26 mmol/L (ref 22–32)
Calcium: 9.3 mg/dL (ref 8.9–10.3)
Chloride: 107 mmol/L (ref 98–111)
Creatinine, Ser: 1.22 mg/dL — ABNORMAL HIGH (ref 0.44–1.00)
GFR, Estimated: 47 mL/min — ABNORMAL LOW (ref >60)
Glucose, Bld: 93 mg/dL (ref 70–99)
Potassium: 4.9 mmol/L (ref 3.5–5.1)
Sodium: 140 mmol/L (ref 135–145)

## 2022-02-21 LAB — BRAIN NATRIURETIC PEPTIDE: B Natriuretic Peptide: 154.2 pg/mL — ABNORMAL HIGH (ref 0.0–100.0)

## 2022-02-21 LAB — MAGNESIUM: Magnesium: 2.5 mg/dL — ABNORMAL HIGH (ref 1.7–2.4)

## 2022-02-21 NOTE — Progress Notes (Signed)
Electrophysiology Office Follow up Visit Note:    Date:  02/21/2022   ID:  Ann White, DOB 06-12-1946, MRN [id number]  PCP:  Ria Bush, MD  [facility] Cardiologist:  Nelva Bush, MD  [facility] Electrophysiologist:  Vickie Epley, MD    Interval History:    Ann White is a 76 y.o. female who presents for a follow up visit. They were last seen in clinic October 11, 2021.  She is maintained on dofetilide 125 mcg by mouth twice daily.  She takes Eliquis for stroke prophylaxis.  She has a history of a AAA post EVAR repair.         Past Medical History:  Diagnosis Date   Allergy    Anxiety    Arthritis    Carotid stenosis    a. 02/2016 Carotid U/S: Bilateral 40-59%; b. 03/2017 Carotid U/S: RICA 03-50%, LICA 0-93%; c. 81/8299 Carotid U/S: bilat 40-59% ICA dzs.   Cervical spondylosis    s/p spine injections   CKD (chronic kidney disease), stage III (HCC)    Depression    DVT (deep venous thrombosis) ([place])    RLE DVT 05/27/15   Dysrhythmia    afib   Ectatic abdominal aorta ([place]) 2016   rpt [place] 5 yrs   GERD (gastroesophageal reflux disease)    History of chicken pox    History of diverticulitis of colon    HLD (hyperlipidemia)    HTN (hypertension)    Irritable bowel syndrome with constipation    Lichen sclerosus et atrophicus    NICM (nonischemic cardiomyopathy) ([place])    a. 04/2017 Echo: EF 35-40%, diff HK; b. 05/2017 MV: EF 56%, no ischemia/infarct; c. 04/2018 Echo: EF 55-60%, no rwma, Gr2 DD; d. 03/2020 Echo: EF 40-45%, nl PASP, mod dil LA, mild to mod dil RA, Triv MR.   Obesity    Persistent atrial fibrillation ([place])    a. Dx 03/2020. CHA2DS2VASc = 5-->xarelto 50m daily.   Premature atrial contraction    a. 04/2017 48hr Holter: Predominant rhythm - sinus. Rare PVC's, freq PAC's with freq atrial runs up to 34 beats-->metoprolol started.   Seasonal allergic rhinitis     Past Surgical History:  Procedure Laterality Date   ABDOMINAL  AORTIC ANEURYSM REPAIR  09/2021   CDonzetta Matters  ABDOMINAL AORTIC ENDOVASCULAR STENT GRAFT  10/03/2021   Procedure: ABDOMINAL AORTIC ENDOVASCULAR STENT GRAFT;  Surgeon: CWaynetta Sandy MD;  [place] [facility]  Service: Vascular;;   ABDOMINAL HYSTERECTOMY     bladder tack  1990s   BREAST BIOPSY Right 03/05/2012    benign   CARDIOVERSION N/A 05/17/2020   Procedure: CARDIOVERSION;  Surgeon: ENelva Bush MD;  Location: [facility] ORS;  Service: Cardiovascular;  Laterality: N/A;   CARPAL TUNNEL RELEASE  2004, 2013   bilateral   CHOLECYSTECTOMY  1990s   COLONOSCOPY  2002   COLONOSCOPY  06/2013   mod diverticulosis, 3 tubular adenomas, int hem rpt 5 yrs ([place] [facility]   COLONOSCOPY  06/2018   TA,c olonic angiodysplastic lesion, mod diverticulosis, rpt 5 yrs ([place] Plan   DEXA  07/2011   normal, T score -0.3   ESOPHAGOGASTRODUODENOSCOPY ENDOSCOPY  2004   normal ([place])   TOTAL ABDOMINAL HYSTERECTOMY W/ BILATERAL SALPINGOOPHORECTOMY  1990s   complete, dysmenorrhea and fibroids   ULTRASOUND GUIDANCE FOR VASCULAR ACCESS  10/03/2021   Procedure: ULTRASOUND GUIDANCE FOR VASCULAR ACCESS;  Surgeon: CWaynetta Sandy MD;  [facility];  Service: Vascular;;    Current Medications: Current Meds  Medication Sig   acetaminophen (TYLENOL) 500 MG tablet Take 500-1,000 mg by mouth every 6 (six) hours as needed (knee pain.).   apixaban (ELIQUIS) 5 MG TABS tablet Take 1 tablet (5 mg total) by mouth 2 (two) times daily.   cetirizine (ZYRTEC) 10 MG tablet Take 10 mg by mouth daily as needed for allergies.   Cholecalciferol (VITAMIN D3) 1000 UNITS CAPS Take 1 capsule (1,000 Units total) by mouth daily.   clonazePAM (KLONOPIN) 1 MG tablet Take 1 tablet by mouth twice daily as needed   diphenhydrAMINE (BENADRYL) 50 MG capsule Take 50 mg by mouth 1 hour prior to your procedure.   docusate sodium (COLACE) 100 MG capsule Take 100-200 mg by mouth daily as needed (constipation.).   dofetilide (TIKOSYN) 125 MCG  capsule TAKE 1 CAPSULE BY MOUTH TWO TIMES A DAY   gabapentin (NEURONTIN) 300 MG capsule Take 1 capsule (300 mg total) by mouth at bedtime.   losartan (COZAAR) 100 MG tablet Take 1 tablet (100 mg total) by mouth daily.   Multiple Vitamin (MULTIVITAMIN WITH MINERALS) TABS tablet Take 1 tablet by mouth daily.   oxymetazoline (AFRIN) 0.05 % nasal spray Place 1 spray into both nostrils 2 (two) times daily as needed for congestion.   pantoprazole (PROTONIX) 40 MG tablet Take 1 tablet by mouth once daily   rosuvastatin (CRESTOR) 5 MG tablet TAKE 1 TABLET BY MOUTH THREE TIMES A WEEK   sertraline (ZOLOFT) 100 MG tablet Take 1 tablet by mouth once daily   traMADol (ULTRAM) 50 MG tablet TAKE 1 TABLET BY MOUTH THREE TIMES DAILY AS NEEDED FOR  KNEE  PAIN   vitamin B-12 (CYANOCOBALAMIN) 500 MCG tablet Take 500 mcg by mouth daily.      Allergies:   Sulfa antibiotics, Nexletol [bempedoic acid], Tricor [fenofibrate], Zetia [ezetimibe], Contrast media [iodinated contrast media], Lipitor [atorvastatin], and Pravastatin   Social History   Socioeconomic History   Marital status: Married    Spouse name: Freddie   Number of children: 2   Years of education: Not on file   Highest education level: Not on file  Occupational History   Occupation: retired   Tobacco Use   Smoking status: Former    Packs/day: 0.25    Years: 3.00    Total pack years: 0.75    Types: Cigarettes    Quit date: 1990    Years since quitting: 33.6   Smokeless tobacco: Never   Tobacco comments:    minimal smoking history  Vaping Use   Vaping Use: Never used  Substance and Sexual Activity   Alcohol use: No    Alcohol/week: 0.0 standard drinks of alcohol   Drug use: No   Sexual activity: Not Currently  Other Topics Concern   Not on file  Social History Narrative   Lives with husband and 2 cats.  2 grown children, 5 grandchildren.   Occupation: retired, worked in [employer]   Activity: no regular exercise.  Does walk in summer   Diet:  fruits/vegetables daily, good water.   Social Determinants of Health   Financial Resource Strain: Low Risk  (07/12/2021)   Overall Financial Resource Strain (CARDIA)    Difficulty of Paying Living Expenses: Not very hard  Food Insecurity: No Food Insecurity (07/12/2021)   Hunger Vital Sign    Worried About Running Out of Food in the Last Year: Never true    Ran Out of Food in the Last Year: Never true  Transportation Needs: No Transportation Needs (07/12/2021)  PRAPARE - Hydrologist (Medical): No    Lack of Transportation (Non-Medical): No  Physical Activity: Inactive (07/12/2021)   Exercise Vital Sign    Days of Exercise per Week: 0 days    Minutes of Exercise per Session: 0 min  Stress: No Stress Concern Present (07/12/2021)   [place]    Feeling of Stress : Not at all  Social Connections: Moderately Integrated (07/12/2021)   Social Connection and Isolation Panel [NHANES]    Frequency of Communication with Friends and Family: More than three times a week    Frequency of Social Gatherings with Friends and Family: More than three times a week    Attends Religious Services: More than 4 times per year    Active Member of [employer] or Organizations: No    Attends Music therapist: Never    Marital Status: Married     Family History: The patient's family history includes CAD (age of onset: 49) in her father; CAD (age of onset: 3) in her mother; Diabetes in her father; Hyperlipidemia in her sister; Hypertension in her mother; Parkinson's disease in her father. There is no history of Cancer, Stroke, Colon cancer, Breast cancer, Colon polyps, Esophageal cancer, Stomach cancer, or Rectal cancer.  ROS:   Please see the history of present illness.    All other systems reviewed and are negative.  EKGs/Labs/Other Studies Reviewed:    The following studies were reviewed  today:  Dec 13, 2021 labs show a creatinine of 1.1  EKG:  The ekg ordered today demonstrates sinus rhythm.  Low voltage QRS.  QTc is 402 ms.  Recent Labs: 07/19/2021: TSH 3.93 07/26/2021: Magnesium 2.3 09/29/2021: ALT 13 10/04/2021: Hemoglobin 7.9; Platelets 111 12/13/2021: BUN 25; Creatinine, Ser 1.16; Potassium 4.4; Sodium 137  Recent Lipid Panel    Component Value Date/Time   CHOL 132 06/23/2021 1042   CHOL 215 (H) 03/24/2021 1047   TRIG 103 06/23/2021 1042   HDL 59 06/23/2021 1042   HDL 52 03/24/2021 1047   CHOLHDL 2.2 06/23/2021 1042   VLDL 21 06/23/2021 1042   LDLCALC 52 06/23/2021 1042   LDLCALC 132 (H) 03/24/2021 1047   LDLDIRECT 132.0 03/18/2019 0916    Physical Exam:    VS:  BP (!) 140/60 (BP Location: Left Arm, Patient Position: Sitting, Cuff Size: Normal)   Pulse (!) 49   Ht _0  (1.6 m)   Wt 147 lb (66.7 kg)   SpO2 98%   BMI 26.04 kg/m     Wt Readings from Last 3 Encounters:  02/21/22 147 lb (66.7 kg)  01/30/22 145 lb 6 oz (65.9 kg)  12/25/21 142 lb 7 oz (64.6 kg)     GEN:  Well nourished, well developed in no acute distress HEENT: Normal NECK: No JVD; No carotid bruits LYMPHATICS: No lymphadenopathy CARDIAC: RRR, no murmurs, rubs, gallops RESPIRATORY:  Clear to auscultation without rales, wheezing or rhonchi  ABDOMEN: Soft, non-tender, non-distended MUSCULOSKELETAL:  No edema; No deformity  SKIN: Warm and dry NEUROLOGIC:  Alert and oriented x 3 PSYCHIATRIC:  Normal affect        ASSESSMENT:    1. Persistent atrial fibrillation ([place])   2. Chronic HFrEF (heart failure with reduced ejection fraction) ([place])   3. Nonischemic cardiomyopathy ([place])   4. Encounter for long-term (current) use of high-risk medication    PLAN:    In order of problems listed above:  #Persistent atrial  fibrillation Continue Eliquis for stroke prophylaxis Continue dofetilide 125 mcg by mouth twice daily Check NT proBNP, BMP and magnesium today QTc today acceptable  for continued Tikosyn use  #Chronic systolic heart failure NYHA class II symptoms Recovered EF on last echo in March 2022, 60%.  Previously 40% in 2021. Continue current medicines including losartan.  Alleviate HF briefly discussed during today's appointment.  She would be interested if she met the inclusion criteria.  Follow-up with our clinic in 3 to 4 months for dofetilide monitoring.  APP appointment okay.   Medication Adjustments/Labs and Tests Ordered: Current medicines are reviewed at length with the patient today.  Concerns regarding medicines are outlined above.  Orders Placed This Encounter  Procedures   Basic Metabolic Panel (BMET)   Magnesium   B Nat Peptide   EKG 12-Lead   No orders of the defined types were placed in this encounter.    Signed, Lars Mage, MD, [facility], [facility] 02/21/2022 9:57 AM    Electrophysiology [facility]

## 2022-02-21 NOTE — Patient Instructions (Signed)
Medication Instructions:  none *If you need a refill on your cardiac medications before your next appointment, please call your pharmacy*   Lab Work: BMP, MAG, BNP  If you have labs (blood work) drawn today and your tests are completely normal, you will receive your results only by: [place] (if you have MyChart) OR A paper copy in the mail If you have any lab test that is abnormal or we need to change your treatment, we will call you to review the results.   Testing/Procedures: none   Follow-Up: At [facility], you and your health needs are our priority.  As part of our continuing mission to provide you with exceptional heart care, we have created designated Provider Care Teams.  These Care Teams include your primary Cardiologist (physician) and Advanced Practice Providers (APPs -  Physician Assistants and Nurse Practitioners) who all work together to provide you with the care you need, when you need it.  We recommend signing up for the patient portal called "MyChart".  Sign up information is provided on this After Visit Summary.  MyChart is used to connect with patients for Virtual Visits (Telemedicine).  Patients are able to view lab/test results, encounter notes, upcoming appointments, etc.  Non-urgent messages can be sent to your provider as well.   To learn more about what you can do with MyChart, go to [email or web address].    Your next appointment:   3 month(s)  The format for your next appointment:   In Person  Provider:   You will see one of the following Advanced Practice Providers on your designated Care Team:   Murray Hodgkins, NP Christell Faith, PA-C Cadence Kathlen Mody, [place]     Other Instructions none  Important Information About Sugar

## 2022-02-26 ENCOUNTER — Telehealth: Payer: Self-pay | Admitting: Internal Medicine

## 2022-02-26 NOTE — Telephone Encounter (Signed)
Pt c/o medication issue:  1. Name of Medication: repatha  2. How are you currently taking this medication (dosage and times per day)? Injects every 14 days  3. Are you having a reaction (difficulty breathing--STAT)? no  4. What is your medication issue? Patient states her insurance will not cover the medication and she is out of shots.

## 2022-02-26 NOTE — Telephone Encounter (Signed)
Left detailed message that medication is covered and will be no cost to her with instructions to call back if she has any questions.

## 2022-02-26 NOTE — Telephone Encounter (Signed)
Patient's formulary changed last month. Repatha is now covered. Spoke with Missy at [facility] and had her run a prescription through. Repatha covered by insuance and with great assistance it will be covered at no charge.

## 2022-03-12 ENCOUNTER — Other Ambulatory Visit: Payer: Self-pay | Admitting: Internal Medicine

## 2022-03-12 NOTE — Telephone Encounter (Signed)
Prescription refill request for Eliquis received. Indication: AF Last office visit: 02/21/22  Grayce Sessions MD Scr: 1.22 on 02/21/22 Age: 76 Weight: 66.7kg  Based on above findings Eliquis '5mg'$  twice daily is the appropriate dose.  Refill approved.

## 2022-03-12 NOTE — Telephone Encounter (Signed)
Refill request

## 2022-03-13 ENCOUNTER — Other Ambulatory Visit: Payer: Self-pay | Admitting: Internal Medicine

## 2022-03-16 ENCOUNTER — Other Ambulatory Visit: Payer: Self-pay | Admitting: Internal Medicine

## 2022-05-24 ENCOUNTER — Encounter: Payer: Self-pay | Admitting: Internal Medicine

## 2022-05-24 ENCOUNTER — Other Ambulatory Visit
Admission: RE | Admit: 2022-05-24 | Discharge: 2022-05-24 | Disposition: A | Discharge status: Home / Self Care | Payer: Medicare Other | Source: Ambulatory Visit | Attending: Internal Medicine | Admitting: Internal Medicine

## 2022-05-24 ENCOUNTER — Ambulatory Visit: Payer: Medicare Other | Attending: Internal Medicine | Admitting: Internal Medicine

## 2022-05-24 VITALS — BP 132/72 | HR 47 | Ht 63.0 in | Wt 145.0 lb

## 2022-05-24 DIAGNOSIS — I5022 Chronic systolic (congestive) heart failure: Secondary | ICD-10-CM

## 2022-05-24 DIAGNOSIS — I10 Essential (primary) hypertension: Secondary | ICD-10-CM

## 2022-05-24 DIAGNOSIS — Z79899 Other long term (current) drug therapy: Secondary | ICD-10-CM

## 2022-05-24 DIAGNOSIS — I428 Other cardiomyopathies: Secondary | ICD-10-CM

## 2022-05-24 DIAGNOSIS — I7143 Infrarenal abdominal aortic aneurysm, without rupture: Secondary | ICD-10-CM | POA: Diagnosis not present

## 2022-05-24 DIAGNOSIS — I4819 Other persistent atrial fibrillation: Secondary | ICD-10-CM

## 2022-05-24 DIAGNOSIS — E785 Hyperlipidemia, unspecified: Secondary | ICD-10-CM

## 2022-05-24 DIAGNOSIS — D649 Anemia, unspecified: Secondary | ICD-10-CM | POA: Diagnosis not present

## 2022-05-24 LAB — COMPREHENSIVE METABOLIC PANEL
ALT: 10 U/L (ref 0–44)
AST: 16 U/L (ref 15–41)
Albumin: 4.3 g/dL (ref 3.5–5.0)
Alkaline Phosphatase: 44 U/L (ref 38–126)
Anion gap: 7 (ref 5–15)
BUN: 34 mg/dL — ABNORMAL HIGH (ref 8–23)
CO2: 24 mmol/L (ref 22–32)
Calcium: 9.6 mg/dL (ref 8.9–10.3)
Chloride: 109 mmol/L (ref 98–111)
Creatinine, Ser: 1.19 mg/dL — ABNORMAL HIGH (ref 0.44–1.00)
GFR, Estimated: 48 mL/min — ABNORMAL LOW (ref >60)
Glucose, Bld: 96 mg/dL (ref 70–99)
Potassium: 4.5 mmol/L (ref 3.5–5.1)
Sodium: 140 mmol/L (ref 135–145)
Total Bilirubin: 0.5 mg/dL (ref 0.3–1.2)
Total Protein: 7.9 g/dL (ref 6.5–8.1)

## 2022-05-24 LAB — LIPID PANEL
Cholesterol: 112 mg/dL (ref 0–200)
HDL: 50 mg/dL (ref >40)
LDL Cholesterol: 37 mg/dL (ref 0–99)
Total CHOL/HDL Ratio: 2.2 RATIO
Triglycerides: 124 mg/dL (ref <150)
VLDL: 25 mg/dL (ref 0–40)

## 2022-05-24 LAB — CBC
HCT: 29.4 % — ABNORMAL LOW (ref 36.0–46.0)
Hemoglobin: 9.7 g/dL — ABNORMAL LOW (ref 12.0–15.0)
MCH: 29 pg (ref 26.0–34.0)
MCHC: 33 g/dL (ref 30.0–36.0)
MCV: 87.8 fL (ref 80.0–100.0)
Platelets: 172 10*3/uL (ref 150–400)
RBC: 3.35 MIL/uL — ABNORMAL LOW (ref 3.87–5.11)
RDW: 13.4 % (ref 11.5–15.5)
WBC: 3.7 10*3/uL — ABNORMAL LOW (ref 4.0–10.5)
nRBC: 0 % (ref 0.0–0.2)

## 2022-05-24 NOTE — Progress Notes (Signed)
Follow-up Outpatient Visit Date: 05/24/2022  Primary Care Provider: Ria Bush, MD [place] [address]  Chief Complaint: Follow-up HFrEF and atrial fibrillation  HPI:  Ms. Breitenstein is a 76 y.o. female with history of nonischemic cardiomyopathy diagnosed in 04/2017 (MPI without perfusion defects LVEF as low as 35-40% but normalized on repeat echo in 04/2018), persistent atrial fibrillation diagnosed in 03/2020 on dofetilide, carotid artery stenosis, abdominal aortic aneurysm, right calf DVT, hypertension, hyperlipidemia, and chronic kidney disease, who presents for follow-up of myopathy and atrial fibrillation.  I last saw her in May, at which time she was feeling well, having recovered from her EVAR two months earlier.  As blood pressure was suboptimally controlled, we agreed to increase losartan to 100 mg daily.  Today, Ms. Bonet reports that she is feeling well.  Her only complaint is of sporadic occipital headaches that began a few months ago.  They happen once or twice a week and typically only last a few seconds to minutes.  There are no associated symptoms.  She denies other focal neurologic deficits.  She has not had any chest pain, shortness of breath, palpitations, lightheadedness, or edema.  Soreness in her groins following EVAR has resolved.  --------------------------------------------------------------------------------------------------  Cardiovascular History & Procedures: Cardiovascular Problems: Persistent atrial fibrillation Nonischemic cardiomyopathy Carotid artery stenosis AAA s/p EVAR   Risk Factors: Hypertension, hyperlipidemia, and age greater than 13   Cath/PCI: None   CV Surgery: EVAR (10/03/2021, Dr. Donzetta Matters)   EP Procedures and Devices: DCCV (05/17/20) 48 hour Holter monitor (04/29/17): Sinus rhythm with frequent PACs and atrial runs, lasting up to 34 beats.   Non-Invasive Evaluation(s): ABIs (08/30/2021): Normal bilateral  ABIs/TBI's. Carotid Doppler (08/15/2021): Moderate bilateral ICA stenoses of 40-59%.  Greater than 50% stenoses in both ECAs.  Antegrade vertebral artery flow.  Disturbed flow in both subclavian arteries. AAA duplex (08/15/2021): Abnormal dilation of the mid and distal abdominal aorta measuring up to 5.1 cm. TTE (09/20/2020): Normal LV size wall thickness.  LVEF 60-65% with normal wall motion.  Grade 1 diastolic dysfunction.  Normal RV size and function.  Severe left and mild to moderate right atrial enlargement.  Trivial mitral and mild tricuspid regurgitation. Carotid Doppler (08/15/2020): 40-59% stenoses in both carotid arteries.  Greater than 50% stenosis of right external carotid artery.  Antegrade vertebral artery flow. TTE (04/14/2020): Normal LV size and wall thickness.  LVEF 40-45% with global hypokinesis.  Normal RV size and function.  Moderate left atrial and mild to moderate right atrial enlargement.  Trivial mitral and mild tricuspid regurgitation. Carotid Doppler (04/17/2019): 40-59% stenosis in the carotid arteries bilaterally.  Antegrade flow in the vertebral arteries.  Normal subclavian flow. TTE (04/24/18): Normal LV size.  LVEF 55-60% with normal wall motion.  Grade 2 diastolic dysfunction.  Mild left atrial enlargement.  Normal RV size and function.  Mild pulmonary hypertension (PASP 41 mmHg). Exercise MPI (06/12/17): No evidence of ischemia. LVEF 56%. Hypertensive blood pressure response to exercise. Echo (04/29/17): Normal LV size with moderately reduced contraction. LVEF 35-40% with global hypokinesis. Mild MR. Mild left atrial enlargement. Normal RV size and function. Normal PA pressure. Carotid Doppler (03/25/17): Heterogeneous plaque, bilaterally. Stable, 40-59% RICA stenosis. PRFFMB,8-46% LICA stenosis. Stable, >50% RECA stenosis. Patent vertebral arteries with antegrade flow. Normal subclavian arteries, bilaterally.  Recent CV Pertinent Labs: Lab Results  Component Value Date    CHOL 132 06/23/2021   CHOL 215 (H) 03/24/2021   HDL 59 06/23/2021   HDL 52 03/24/2021  LDLCALC 52 06/23/2021   LDLCALC 132 (H) 03/24/2021   LDLDIRECT 132.0 03/18/2019   TRIG 103 06/23/2021   CHOLHDL 2.2 06/23/2021   INR 1.4 (H) 09/29/2021   BNP 154.2 (H) 02/21/2022   K 4.9 02/21/2022   MG 2.5 (H) 02/21/2022   BUN 24 (H) 02/21/2022   BUN 32 (H) 09/29/2021   CREATININE 1.22 (H) 02/21/2022   CREATININE 1.19 07/26/2011    Past medical and surgical history were reviewed and updated in EPIC.  Current Meds  Medication Sig   acetaminophen (TYLENOL) 500 MG tablet Take 500-1,000 mg by mouth every 6 (six) hours as needed (knee pain.).   apixaban (ELIQUIS) 5 MG TABS tablet Take 1 tablet by mouth twice daily   cetirizine (ZYRTEC) 10 MG tablet Take 10 mg by mouth daily as needed for allergies.   Cholecalciferol (VITAMIN D3) 1000 UNITS CAPS Take 1 capsule (1,000 Units total) by mouth daily.   clonazePAM (KLONOPIN) 1 MG tablet Take 1 tablet by mouth twice daily as needed   diphenhydrAMINE (BENADRYL) 50 MG capsule Take 50 mg by mouth 1 hour prior to your procedure.   docusate sodium (COLACE) 100 MG capsule Take 100-200 mg by mouth daily as needed (constipation.).   dofetilide (TIKOSYN) 125 MCG capsule TAKE 1 CAPSULE BY MOUTH TWO TIMES A DAY   Evolocumab (REPATHA SURECLICK) 284 MG/ML SOAJ Inject 1 pen  into the skin every 14 (fourteen) days.   gabapentin (NEURONTIN) 300 MG capsule Take 1 capsule (300 mg total) by mouth at bedtime.   losartan (COZAAR) 100 MG tablet Take 1 tablet (100 mg total) by mouth daily.   Multiple Vitamin (MULTIVITAMIN WITH MINERALS) TABS tablet Take 1 tablet by mouth daily.   oxymetazoline (AFRIN) 0.05 % nasal spray Place 1 spray into both nostrils 2 (two) times daily as needed for congestion.   pantoprazole (PROTONIX) 40 MG tablet Take 1 tablet by mouth once daily   rosuvastatin (CRESTOR) 5 MG tablet TAKE 1 TABLET BY MOUTH THREE TIMES A WEEK   sertraline (ZOLOFT) 100 MG  tablet Take 1 tablet by mouth once daily   traMADol (ULTRAM) 50 MG tablet TAKE 1 TABLET BY MOUTH THREE TIMES DAILY AS NEEDED FOR  KNEE  PAIN   vitamin B-12 (CYANOCOBALAMIN) 500 MCG tablet Take 500 mcg by mouth daily.     Allergies: Sulfa antibiotics, Nexletol [bempedoic acid], Tricor [fenofibrate], Zetia [ezetimibe], Contrast media [iodinated contrast media], Lipitor [atorvastatin], and Pravastatin  Social History   Tobacco Use   Smoking status: Former    Packs/day: 0.25    Years: 3.00    Total pack years: 0.75    Types: Cigarettes    Quit date: 1990    Years since quitting: 33.8   Smokeless tobacco: Never   Tobacco comments:    minimal smoking history  Vaping Use   Vaping Use: Never used  Substance Use Topics   Alcohol use: No    Alcohol/week: 0.0 standard drinks of alcohol   Drug use: No    Family History  Problem Relation Age of Onset   CAD Mother 2       MI   Hypertension Mother    Parkinson's disease Father    Diabetes Father    CAD Father 84       MI   Hyperlipidemia Sister    Cancer Neg Hx    Stroke Neg Hx    Colon cancer Neg Hx    Breast cancer Neg Hx    Colon polyps Neg  Hx    Esophageal cancer Neg Hx    Stomach cancer Neg Hx    Rectal cancer Neg Hx     Review of Systems: A 12-system review of systems was performed and was negative except as noted in the HPI.  --------------------------------------------------------------------------------------------------  Physical Exam: BP 132/72 (BP Location: Left Arm, Patient Position: Sitting, Cuff Size: Normal)   Pulse (!) 47   Ht '5\' 3"'$  (1.6 m)   Wt 145 lb (65.8 kg)   SpO2 97%   BMI 25.69 kg/m   General:  NAD. Neck: No JVD or HJR. Lungs: Clear to auscultation bilaterally without wheezes or crackles. Heart: Bradycardic but regular without murmurs, rubs, or gallops. Abdomen: Soft, nontender, nondistended. Extremities: No lower extremity edema.  EKG: Sinus bradycardia with low voltage.  QTc 396 ms.  No  significant change from prior tracing on 02/21/2022.  Lab Results  Component Value Date   WBC 5.5 10/04/2021   HGB 7.9 (L) 10/04/2021   HCT 23.7 (L) 10/04/2021   MCV 90.5 10/04/2021   PLT 111 (L) 10/04/2021    Lab Results  Component Value Date   NA 140 02/21/2022   K 4.9 02/21/2022   CL 107 02/21/2022   CO2 26 02/21/2022   BUN 24 (H) 02/21/2022   CREATININE 1.22 (H) 02/21/2022   GLUCOSE 93 02/21/2022   ALT 13 09/29/2021    Lab Results  Component Value Date   CHOL 132 06/23/2021   HDL 59 06/23/2021   LDLCALC 52 06/23/2021   LDLDIRECT 132.0 03/18/2019   TRIG 103 06/23/2021   CHOLHDL 2.2 06/23/2021    --------------------------------------------------------------------------------------------------  ASSESSMENT AND PLAN: Persistent atrial fibrillation: Ms. Kilner is maintaining sinus rhythm and tolerating her current regimen of dofetilide and apixaban well.  Her QTc remains acceptable.  She remains bradycardic but is asymptomatic.  We will not make any medication changes today.  She should continue following with Dr. Quentin Ore in the [place] clinic as previously arranged.  Chronic HFrEF with recovered ejection fraction due to nonischemic cardiomyopathy: Ms. Hino appears euvolemic and well compensated with NYHA class I symptoms.  We will continue losartan 100 mg daily.  She is unable to tolerate a beta-blocker given her resting bradycardia.  Defer challenging with an aldosterone antagonist and SGLT2 inhibitor in the setting of normalized LVEF and class I symptoms.  AAA status post EVAR: Ms. Pallas has recovered well from her endovascular AAA repair in March.  We will continue rosuvastatin and evolocumab for secondary prevention.  Lipids were well controlled on last check in 06/2021.  Ongoing follow-up with Dr. Donzetta Matters in the vascular surgery clinic.  I will check a CMP and lipid panel today to ensure that lipids are adequately controlled.  Postoperative anemia: Hemoglobin has not been  rechecked since hospitalization at time of EVAR, at which time it was quite low at 7.9.  Though Ms. Bouza does not have any symptoms of severe anemia, we have agreed to recheck a CBC today in the setting of ongoing anticoagulation.  Hypertension: Blood pressure improved compared to last visit but still borderline elevated.  We will continue losartan 100 mg daily.  Continued sodium restriction and regular exercise encouraged.  Follow-up: Return to clinic in 1 year.  Nelva Bush, MD 05/24/2022 10:21 AM

## 2022-05-24 NOTE — Patient Instructions (Signed)
Medication Instructions:  Your Physician recommend you continue on your current medication as directed.    *If you need a refill on your cardiac medications before your next appointment, please call your pharmacy*   Lab Work: Your provider would like for you to have the following labs drawn today: (CBC, CMP, Lipid).   Please go to the [facility] entrance and check in at the front desk.  You do not need an appointment.  They are open from 7am-6 pm.  You do not need to be fasting.  If you have labs (blood work) drawn today and your tests are completely normal, you will receive your results only by: [place] (if you have MyChart) OR A paper copy in the mail If you have any lab test that is abnormal or we need to change your treatment, we will call you to review the results.   Testing/Procedures: None ordered today   Follow-Up: At [facility], you and your health needs are our priority.  As part of our continuing mission to provide you with exceptional heart care, we have created designated Provider Care Teams.  These Care Teams include your primary Cardiologist (physician) and Advanced Practice Providers (APPs -  Physician Assistants and Nurse Practitioners) who all work together to provide you with the care you need, when you need it.  We recommend signing up for the patient portal called "MyChart".  Sign up information is provided on this After Visit Summary.  MyChart is used to connect with patients for Virtual Visits (Telemedicine).  Patients are able to view lab/test results, encounter notes, upcoming appointments, etc.  Non-urgent messages can be sent to your provider as well.   To learn more about what you can do with MyChart, go to [email or web address].    Your next appointment:   1 year(s)  The format for your next appointment:   In Person  Provider:   Nelva Bush, MD

## 2022-05-25 ENCOUNTER — Ambulatory Visit: Payer: Medicare Other | Admitting: Nurse Practitioner

## 2022-05-26 ENCOUNTER — Encounter: Payer: Self-pay | Admitting: Internal Medicine

## 2022-06-01 ENCOUNTER — Emergency Department (HOSPITAL_COMMUNITY)
Admission: EM | Admit: 2022-06-01 | Discharge: 2022-06-01 | Disposition: A | Discharge status: Home / Self Care | Payer: Medicare Other | Attending: Emergency Medicine | Admitting: Emergency Medicine

## 2022-06-01 ENCOUNTER — Other Ambulatory Visit: Payer: Self-pay

## 2022-06-01 ENCOUNTER — Encounter (HOSPITAL_COMMUNITY): Payer: Self-pay | Admitting: Emergency Medicine

## 2022-06-01 ENCOUNTER — Emergency Department (HOSPITAL_COMMUNITY): Payer: Medicare Other

## 2022-06-01 ENCOUNTER — Ambulatory Visit: Payer: Medicare Other | Admitting: Family Medicine

## 2022-06-01 DIAGNOSIS — K5730 Diverticulosis of large intestine without perforation or abscess without bleeding: Secondary | ICD-10-CM | POA: Diagnosis not present

## 2022-06-01 DIAGNOSIS — R42 Dizziness and giddiness: Secondary | ICD-10-CM | POA: Insufficient documentation

## 2022-06-01 DIAGNOSIS — R112 Nausea with vomiting, unspecified: Secondary | ICD-10-CM | POA: Diagnosis not present

## 2022-06-01 DIAGNOSIS — R29818 Other symptoms and signs involving the nervous system: Secondary | ICD-10-CM | POA: Diagnosis not present

## 2022-06-01 DIAGNOSIS — R519 Headache, unspecified: Secondary | ICD-10-CM | POA: Diagnosis not present

## 2022-06-01 DIAGNOSIS — R404 Transient alteration of awareness: Secondary | ICD-10-CM | POA: Diagnosis not present

## 2022-06-01 DIAGNOSIS — Z7901 Long term (current) use of anticoagulants: Secondary | ICD-10-CM | POA: Insufficient documentation

## 2022-06-01 DIAGNOSIS — R1030 Lower abdominal pain, unspecified: Secondary | ICD-10-CM | POA: Diagnosis not present

## 2022-06-01 DIAGNOSIS — R531 Weakness: Secondary | ICD-10-CM

## 2022-06-01 DIAGNOSIS — Z743 Need for continuous supervision: Secondary | ICD-10-CM | POA: Diagnosis not present

## 2022-06-01 DIAGNOSIS — T699XXA Effect of reduced temperature, unspecified, initial encounter: Secondary | ICD-10-CM | POA: Diagnosis not present

## 2022-06-01 DIAGNOSIS — I499 Cardiac arrhythmia, unspecified: Secondary | ICD-10-CM | POA: Diagnosis not present

## 2022-06-01 DIAGNOSIS — R1084 Generalized abdominal pain: Secondary | ICD-10-CM | POA: Diagnosis not present

## 2022-06-01 DIAGNOSIS — R102 Pelvic and perineal pain: Secondary | ICD-10-CM

## 2022-06-01 DIAGNOSIS — R6889 Other general symptoms and signs: Secondary | ICD-10-CM | POA: Diagnosis not present

## 2022-06-01 LAB — URINALYSIS, ROUTINE W REFLEX MICROSCOPIC
Bilirubin Urine: NEGATIVE
Glucose, UA: NEGATIVE mg/dL
Hgb urine dipstick: NEGATIVE
Ketones, ur: NEGATIVE mg/dL
Leukocytes,Ua: NEGATIVE
Nitrite: NEGATIVE
Protein, ur: NEGATIVE mg/dL
Specific Gravity, Urine: 1.009 (ref 1.005–1.030)
pH: 8 (ref 5.0–8.0)

## 2022-06-01 LAB — COMPREHENSIVE METABOLIC PANEL
ALT: 12 U/L (ref 0–44)
AST: 22 U/L (ref 15–41)
Albumin: 4.2 g/dL (ref 3.5–5.0)
Alkaline Phosphatase: 45 U/L (ref 38–126)
Anion gap: 14 (ref 5–15)
BUN: 24 mg/dL — ABNORMAL HIGH (ref 8–23)
CO2: 20 mmol/L — ABNORMAL LOW (ref 22–32)
Calcium: 9.8 mg/dL (ref 8.9–10.3)
Chloride: 106 mmol/L (ref 98–111)
Creatinine, Ser: 1.37 mg/dL — ABNORMAL HIGH (ref 0.44–1.00)
GFR, Estimated: 40 mL/min — ABNORMAL LOW (ref >60)
Glucose, Bld: 135 mg/dL — ABNORMAL HIGH (ref 70–99)
Potassium: 3.7 mmol/L (ref 3.5–5.1)
Sodium: 140 mmol/L (ref 135–145)
Total Bilirubin: 0.4 mg/dL (ref 0.3–1.2)
Total Protein: 7.3 g/dL (ref 6.5–8.1)

## 2022-06-01 LAB — CBC
HCT: 33.3 % — ABNORMAL LOW (ref 36.0–46.0)
Hemoglobin: 11.1 g/dL — ABNORMAL LOW (ref 12.0–15.0)
MCH: 29.8 pg (ref 26.0–34.0)
MCHC: 33.3 g/dL (ref 30.0–36.0)
MCV: 89.5 fL (ref 80.0–100.0)
Platelets: 175 10*3/uL (ref 150–400)
RBC: 3.72 MIL/uL — ABNORMAL LOW (ref 3.87–5.11)
RDW: 13.2 % (ref 11.5–15.5)
WBC: 4.9 10*3/uL (ref 4.0–10.5)
nRBC: 0 % (ref 0.0–0.2)

## 2022-06-01 MED ORDER — LACTATED RINGERS IV BOLUS
1000.0000 mL | Freq: Once | INTRAVENOUS | Status: AC
  Administered 2022-06-01: 1000 mL via INTRAVENOUS

## 2022-06-01 MED ORDER — LORAZEPAM 2 MG/ML IJ SOLN
0.5000 mg | Freq: Once | INTRAMUSCULAR | Status: AC
  Administered 2022-06-01: 0.5 mg via INTRAVENOUS
  Filled 2022-06-01: qty 1

## 2022-06-01 MED ORDER — HYDROMORPHONE HCL 1 MG/ML IJ SOLN
0.5000 mg | Freq: Once | INTRAMUSCULAR | Status: AC
  Administered 2022-06-01: 0.5 mg via INTRAVENOUS
  Filled 2022-06-01: qty 1

## 2022-06-01 MED ORDER — ONDANSETRON HCL 4 MG/2ML IJ SOLN
4.0000 mg | Freq: Once | INTRAMUSCULAR | Status: AC
  Administered 2022-06-01: 4 mg via INTRAVENOUS
  Filled 2022-06-01: qty 2

## 2022-06-01 MED ORDER — ONDANSETRON 8 MG PO TBDP: 8.0000 mg | ORAL_TABLET | Freq: Three times a day (TID) | ORAL | 0 refills | Status: DC | PRN

## 2022-06-01 MED ORDER — ONDANSETRON HCL 4 MG/2ML IJ SOLN
4.0000 mg | Freq: Once | INTRAMUSCULAR | Status: AC
Start: 2022-06-01 — End: 2022-06-01
  Administered 2022-06-01: 4 mg via INTRAVENOUS
  Filled 2022-06-01: qty 2

## 2022-06-01 NOTE — Discharge Instructions (Addendum)
It was our pleasure to provide your ER care today - we hope that you feel better.  Drink plenty of fluids/stay well hydrated. You may take zofran as need for nausea.   Follow up with primary care doctor in the next 1-2 days if symptoms fail to improve/resolve.  Return to ER if worse, new symptoms, fevers, chest pain, trouble breathing, severe abdominal pain, persistent vomiting, fainting, or other concern.   You were given meds in the ER - no driving for the next 6 hours.

## 2022-06-01 NOTE — ED Triage Notes (Signed)
Pt brought in by EMS from home. Pt stated that she woke up feeling sick. She went to the bathroom and had a sudden onset of nausea, vomitting, dizziness, and light photosensitivity. No hx of migraine or vertigo. Pt stated that "The room is spinning and I feel like I am spinning around."  Afib was shown on the monitor but 12-Lead was unremarkable. Pt stated she has a headache behind her eyes. EMS did a stroke scale but it was negative. Pt has a 22g in LFA. She was given 4 of zofran and 250 of NS.   Last VS   BP 120/50 95% RA  99 on 2L  70 P CBG 159

## 2022-06-01 NOTE — ED Notes (Signed)
Pt transported to MRI 

## 2022-06-01 NOTE — ED Notes (Signed)
MD at bedside. 

## 2022-06-01 NOTE — ED Provider Notes (Signed)
[facility] EMERGENCY DEPARTMENT Provider Note   CSN: [id number] Arrival date & time: 06/01/22  0720     History  Chief Complaint  Patient presents with   Nausea   Weakness   Dizziness   Abdominal Pain    Ann White is a 76 y.o. female.  Patient c/o nausea and vomiting this AM, as well as feeling lightheaded/faint, and also c/o dysuria/urgency, and mid to lower abd pain. Symptoms acute onset this AM, moderate-severe, dull, non radiating. No hematuria. Also c/o dull, general headache, moderate. Denies hx frequent headaches. Symptoms acute onset. No eye pain or change in vision. No neck stiffness or pain. No extremity numbness/weakness. No change in speech.  Indicates compliant w normal home meds, denies recent change. Denies trauma/fall. No melena/rectal bleeding or blood loss. No fever or chills.   The history is provided by the patient, the spouse, medical records and the EMS personnel.       Home Medications Prior to Admission medications   Medication Sig Start Date End Date Taking? Authorizing Provider  acetaminophen (TYLENOL) 500 MG tablet Take 500-1,000 mg by mouth every 6 (six) hours as needed (knee pain.).    [provider]  apixaban (ELIQUIS) 5 MG TABS tablet Take 1 tablet by mouth twice daily 03/12/22   End, Harrell Gave, MD  cetirizine (ZYRTEC) 10 MG tablet Take 10 mg by mouth daily as needed for allergies.    [provider]  Cholecalciferol (VITAMIN D3) 1000 UNITS CAPS Take 1 capsule (1,000 Units total) by mouth daily. 03/17/15   Ria Bush, MD  clonazePAM Bobbye [place]) 1 MG tablet Take 1 tablet by mouth twice daily as needed 02/07/22   Ria Bush, MD  diphenhydrAMINE (BENADRYL) 50 MG capsule Take 50 mg by mouth 1 hour prior to your procedure. 10/25/21   Waynetta Sandy, MD  docusate sodium (COLACE) 100 MG capsule Take 100-200 mg by mouth daily as needed (constipation.).    [provider]   dofetilide (TIKOSYN) 125 MCG capsule TAKE 1 CAPSULE BY MOUTH TWO TIMES A DAY 07/04/21   Vickie Epley, MD  Evolocumab (REPATHA SURECLICK) 710 MG/ML SOAJ Inject 1 pen  into the skin every 14 (fourteen) days.    [provider]  gabapentin (NEURONTIN) 300 MG capsule Take 1 capsule (300 mg total) by mouth at bedtime. 12/29/21   Ria Bush, MD  losartan (COZAAR) 100 MG tablet Take 1 tablet (100 mg total) by mouth daily. 11/24/21   End, Harrell Gave, MD  Multiple Vitamin (MULTIVITAMIN WITH MINERALS) TABS tablet Take 1 tablet by mouth daily.    [provider]  oxymetazoline (AFRIN) 0.05 % nasal spray Place 1 spray into both nostrils 2 (two) times daily as needed for congestion.    [provider]  pantoprazole (PROTONIX) 40 MG tablet Take 1 tablet by mouth once daily 10/12/21   Ria Bush, MD  rosuvastatin (CRESTOR) 5 MG tablet TAKE 1 TABLET BY MOUTH THREE TIMES A WEEK 03/16/22   End, Harrell Gave, MD  sertraline (ZOLOFT) 100 MG tablet Take 1 tablet by mouth once daily 11/08/21   Ria Bush, MD  traMADol (ULTRAM) 50 MG tablet TAKE 1 TABLET BY MOUTH THREE TIMES DAILY AS NEEDED FOR  KNEE  PAIN 10/04/21   Dagoberto Ligas, PA-C  vitamin B-12 (CYANOCOBALAMIN) 500 MCG tablet Take 500 mcg by mouth daily.     [provider]      Allergies    Sulfa antibiotics, Nexletol [bempedoic acid], Tricor [fenofibrate], Zetia [ezetimibe],  Contrast media [iodinated contrast media], Lipitor [atorvastatin], and Pravastatin    Review of Systems   Review of Systems  Constitutional:  Negative for chills and fever.  HENT:  Negative for sore throat.   Eyes:  Negative for pain, redness and visual disturbance.  Respiratory:  Negative for cough and shortness of breath.   Cardiovascular:  Negative for chest pain and palpitations.  Gastrointestinal:  Positive for nausea and vomiting. Negative for abdominal pain.  Genitourinary:  Positive for dysuria and urgency.   Musculoskeletal:  Negative for back pain and neck pain.  Skin:  Negative for rash.  Neurological:  Positive for headaches. Negative for speech difficulty.  Hematological:  Does not bruise/bleed easily.  Psychiatric/Behavioral:  Negative for confusion.     Physical Exam Updated Vital Signs BP (!) 146/46   Pulse (!) 58   Temp 98.8 F (37.1 C) (Oral)   Resp 13   SpO2 100%  Physical Exam Vitals and nursing note reviewed.  Constitutional:      Appearance: Normal appearance. She is well-developed.  HENT:     Head: Atraumatic.     Comments: No sinus or temporal tenderness.     Nose: Nose normal.     Mouth/Throat:     Mouth: Mucous membranes are moist.     Pharynx: Oropharynx is clear.  Eyes:     General: No scleral icterus.    Conjunctiva/sclera: Conjunctivae normal.     Pupils: Pupils are equal, round, and reactive to light.  Neck:     Trachea: No tracheal deviation.     Comments: No stiffness or rigidity. Trachea midline. No bruits.  Cardiovascular:     Rate and Rhythm: Normal rate. Rhythm irregular.     Pulses: Normal pulses.     Heart sounds: Normal heart sounds. No murmur heard.    No friction rub. No gallop.  Pulmonary:     Effort: Pulmonary effort is normal. No respiratory distress.     Breath sounds: Normal breath sounds.  Abdominal:     General: Bowel sounds are normal. There is no distension.     Palpations: Abdomen is soft. There is no mass.     Tenderness: There is abdominal tenderness. There is no guarding.     Comments: Tenderness lower abd.   Genitourinary:    Comments: No cva tenderness.  Musculoskeletal:        General: No swelling.     Cervical back: Normal range of motion and neck supple. No rigidity. No muscular tenderness.     Comments: C spine non tender, normal rom.  No focal extremity pain, swelling or tenderness.   Skin:    General: Skin is warm and dry.     Findings: No rash.  Neurological:     Mental Status: She is alert.     Comments:  Alert, speech normal. GCS 15. Motor/sens grossly intact bil.   Psychiatric:        Mood and Affect: Mood normal.     ED Results / Procedures / Treatments   Labs (all labs ordered are listed, but only abnormal results are displayed) Results for orders placed or performed during the hospital encounter of 06/01/22  CBC  Result Value Ref Range   WBC 4.9 4.0 - 10.5 K/uL   RBC 3.72 (L) 3.87 - 5.11 MIL/uL   Hemoglobin 11.1 (L) 12.0 - 15.0 g/dL   HCT 33.3 (L) 36.0 - 46.0 %   MCV 89.5 80.0 - 100.0 fL   MCH 29.8 26.0 -  34.0 pg   MCHC 33.3 30.0 - 36.0 g/dL   RDW 13.2 11.5 - 15.5 %   Platelets 175 150 - 400 K/uL   nRBC 0.0 0.0 - 0.2 %  Comprehensive metabolic panel  Result Value Ref Range   Sodium 140 135 - 145 mmol/L   Potassium 3.7 3.5 - 5.1 mmol/L   Chloride 106 98 - 111 mmol/L   CO2 20 (L) 22 - 32 mmol/L   Glucose, Bld 135 (H) 70 - 99 mg/dL   BUN 24 (H) 8 - 23 mg/dL   Creatinine, Ser 1.37 (H) 0.44 - 1.00 mg/dL   Calcium 9.8 8.9 - 10.3 mg/dL   Total Protein 7.3 6.5 - 8.1 g/dL   Albumin 4.2 3.5 - 5.0 g/dL   AST 22 15 - 41 U/L   ALT 12 0 - 44 U/L   Alkaline Phosphatase 45 38 - 126 U/L   Total Bilirubin 0.4 0.3 - 1.2 mg/dL   GFR, Estimated 40 (L) >60 mL/min   Anion gap 14 5 - 15  Urinalysis, Routine w reflex microscopic Urine, Clean Catch  Result Value Ref Range   Color, Urine YELLOW YELLOW   APPearance CLEAR CLEAR   Specific Gravity, Urine 1.009 1.005 - 1.030   pH 8.0 5.0 - 8.0   Glucose, UA NEGATIVE NEGATIVE mg/dL   Hgb urine dipstick NEGATIVE NEGATIVE   Bilirubin Urine NEGATIVE NEGATIVE   Ketones, ur NEGATIVE NEGATIVE mg/dL   Protein, ur NEGATIVE NEGATIVE mg/dL   Nitrite NEGATIVE NEGATIVE   Leukocytes,Ua NEGATIVE NEGATIVE      EKG EKG Interpretation  Date/Time:  Friday June 01 2022 07:31:28 EST Ventricular Rate:  76 PR Interval:    QRS Duration: 105 QT Interval:  398 QTC Calculation: 448 R Axis:   51 Text Interpretation: Atrial fibrillation Non-specific  ST-t changes Baseline wander Confirmed by Lajean Saver [id number]) on 06/01/2022 7:50:36 AM  Radiology MR Brain Wo Contrast (neuro protocol)  Result Date: 06/01/2022 CLINICAL DATA:  Neuro deficit, acute, stroke suspected dizziness EXAM: MRI HEAD WITHOUT CONTRAST TECHNIQUE: Multiplanar, multiecho pulse sequences of the brain and surrounding structures were obtained without intravenous contrast. COMPARISON:  CT head June 01, 2022. FINDINGS: Brain: No acute infarction, hemorrhage, hydrocephalus, extra-axial collection or mass lesion. Patchy T2/FLAIR hyperintensities within the white matter, nonspecific but compatible with chronic microvascular ischemic disease. Vascular: Major arterial flow voids are maintained at the skull base. Skull and upper cervical spine: Normal marrow signal. Sinuses/Orbits: No acute orbital findings.  Largely clear sinuses. Other: No significant mastoid effusions. IMPRESSION: No evidence of acute intracranial abnormality. Electronically Signed   By: Margaretha Sheffield M.D.   On: 06/01/2022 14:45   CT ABDOMEN PELVIS WO CONTRAST  Result Date: 06/01/2022 CLINICAL DATA:  76 year old female with history of acute onset of nonlocalized abdominal pain. EXAM: CT ABDOMEN AND PELVIS WITHOUT CONTRAST TECHNIQUE: Multidetector CT imaging of the abdomen and pelvis was performed following the standard protocol without IV contrast. RADIATION DOSE REDUCTION: This exam was performed according to the departmental dose-optimization program which includes automated exposure control, adjustment of the mA and/or kV according to patient size and/or use of iterative reconstruction technique. COMPARISON:  CTA of the abdomen and pelvis 11/01/2021. FINDINGS: Lower chest: Atherosclerotic calcifications in the descending thoracic aorta. Hepatobiliary: No definite suspicious cystic or solid hepatic lesions are confidently identified on today's noncontrast CT examination. Status post cholecystectomy. Pancreas: No  definite pancreatic mass or peripancreatic fluid collections or inflammatory changes are noted on today's noncontrast CT examination. Spleen: Unremarkable.  Adrenals/Urinary Tract: Unenhanced appearance of the kidneys and bilateral adrenal glands is unremarkable. No hydroureteronephrosis. Urinary bladder is unremarkable in appearance. Stomach/Bowel: Unenhanced appearance of the stomach is normal. No pathologic dilatation of small bowel or colon. Numerous colonic diverticula are noted, particularly in the sigmoid colon where there is extensive diverticulosis, but no definite focal inflammatory changes to suggest an acute diverticulitis at this time. Normal appendix. Vascular/Lymphatic: Extensive aortic atherosclerosis with aneurysmal dilatation of the infrarenal abdominal aorta which measures up to 4.8 x 4.5 cm (previously 4.7 x 4.6 cm). Postoperative changes of aorto bi-iliac stent graft are noted (graft is incompletely evaluated on today's noncontrast CT examination). No lymphadenopathy noted in the abdomen or pelvis. Reproductive: Status post hysterectomy. Ovaries are not confidently identified and may be surgically absent or atrophic. Other: No significant volume of ascites.  No pneumoperitoneum. Musculoskeletal: There are no aggressive appearing lytic or blastic lesions noted in the visualized portions of the skeleton. IMPRESSION: 1. No acute findings are noted in the abdomen or pelvis to account for the patient's symptoms. 2. Severe colonic diverticulosis without definitive imaging findings to suggest an acute diverticulitis at this time. 3. Aortic atherosclerosis with 4.8 x 4.5 cm infrarenal abdominal aortic aneurysm status post aorto bi-iliac stent graft placement, very similar to prior CTA 11/01/2021. 4. Additional incidental findings, as above. Electronically Signed   By: Vinnie Langton M.D.   On: 06/01/2022 08:50   CT Head Wo Contrast  Result Date: 06/01/2022 CLINICAL DATA:  76 year old female with  history of new onset of headache and weakness. EXAM: CT HEAD WITHOUT CONTRAST TECHNIQUE: Contiguous axial images were obtained from the base of the skull through the vertex without intravenous contrast. RADIATION DOSE REDUCTION: This exam was performed according to the departmental dose-optimization program which includes automated exposure control, adjustment of the mA and/or kV according to patient size and/or use of iterative reconstruction technique. COMPARISON:  No priors. FINDINGS: Brain: Mild cerebral atrophy. Patchy and confluent areas of decreased attenuation are noted throughout the deep and periventricular white matter of the cerebral hemispheres bilaterally, compatible with chronic microvascular ischemic disease. Well-defined areas of low attenuation are noted in the left basal ganglia and left external capsule, compatible with old lacunar infarcts. No evidence of acute infarction, hemorrhage, hydrocephalus, extra-axial collection or mass lesion/mass effect. Vascular: No hyperdense vessel or unexpected calcification. Skull: Normal. Negative for fracture or focal lesion. Sinuses/Orbits: No acute finding. Other: None. IMPRESSION: 1. No acute intracranial abnormalities. 2. Mild cerebral atrophy with chronic microvascular ischemic changes in the cerebral white matter and old left-sided lacunar infarcts, as above. Electronically Signed   By: Vinnie Langton M.D.   On: 06/01/2022 08:45    Procedures Procedures    Medications Ordered in ED Medications  ondansetron (ZOFRAN) injection 4 mg (4 mg Intravenous Given 06/01/22 0843)  HYDROmorphone (DILAUDID) injection 0.5 mg (0.5 mg Intravenous Given 06/01/22 0917)  lactated ringers bolus 1,000 mL (1,000 mLs Intravenous Bolus 06/01/22 0916)  ondansetron (ZOFRAN) injection 4 mg (4 mg Intravenous Given 06/01/22 1206)  lactated ringers bolus 1,000 mL (1,000 mLs Intravenous New Bag/Given 06/01/22 1312)  LORazepam (ATIVAN) injection 0.5 mg (0.5 mg Intravenous  Given 06/01/22 1308)    ED Course/ Medical Decision Making/ A&P                           Medical Decision Making Problems Addressed: Dizziness: acute illness or injury with systemic symptoms Generalized headache: acute illness or injury Generalized weakness: acute illness  or injury with systemic symptoms Nausea and vomiting in adult: acute illness or injury with systemic symptoms that poses a threat to life or bodily functions Suprapubic pain: acute illness or injury  Amount and/or Complexity of Data Reviewed Independent Historian: EMS    Details: hx External Data Reviewed: notes. Labs: ordered. Decision-making details documented in ED Course. Radiology: ordered and independent interpretation performed. Decision-making details documented in ED Course.  Risk Prescription drug management. Parenteral controlled substances. Decision regarding hospitalization.  Iv ns. Continuous pulse ox and cardiac monitoring. Labs ordered/sent. Imaging ordered.   Diff dx includes dehydration, aki, uti, sbo, hem, cva, etc - dispo decision including potential need for admission considered - will get labs and imaging and reassess.   Reviewed nursing notes and prior charts for additional history. External reports reviewed. Additional history from: ems, family.  Cardiac monitor: afib, rate 88.  Labs reviewed/interpreted by me - ua neg for uti. Hct c/w prior.   CT reviewed/interpreted by me - no obstruction/hem.  Zofran iv. Ivf bolus.   Pt remains feeling 'dizzy', and unsteady when up - will get mri r/o posterior circ cva.  MRI reviewed/interpreted by me - no cva.  Symptoms improved,  tolerating po. No nv.   Po fluids, ambulate in hall.   Pt appear stable for d/c.   Rec pcp f/u.  Return precautions provided.            Final Clinical Impression(s) / ED Diagnoses Final diagnoses:  Generalized weakness  Nausea and vomiting in adult  Generalized headache  Suprapubic pain     Rx / DC Orders ED Discharge Orders     None         Lajean Saver, MD 06/01/22 1547

## 2022-06-05 ENCOUNTER — Ambulatory Visit (INDEPENDENT_AMBULATORY_CARE_PROVIDER_SITE_OTHER)
Admission: RE | Admit: 2022-06-05 | Discharge: 2022-06-05 | Disposition: A | Discharge status: Home / Self Care | Payer: Medicare Other | Source: Ambulatory Visit | Attending: Family Medicine | Admitting: Family Medicine

## 2022-06-05 ENCOUNTER — Encounter: Payer: Self-pay | Admitting: Family Medicine

## 2022-06-05 ENCOUNTER — Ambulatory Visit (INDEPENDENT_AMBULATORY_CARE_PROVIDER_SITE_OTHER): Payer: Medicare Other | Admitting: Family Medicine

## 2022-06-05 ENCOUNTER — Telehealth: Payer: Self-pay

## 2022-06-05 ENCOUNTER — Other Ambulatory Visit (HOSPITAL_COMMUNITY): Payer: Self-pay

## 2022-06-05 VITALS — BP 104/72 | HR 68 | Temp 97.8°F | Ht 63.0 in | Wt 141.6 lb

## 2022-06-05 DIAGNOSIS — R059 Cough, unspecified: Secondary | ICD-10-CM | POA: Diagnosis not present

## 2022-06-05 DIAGNOSIS — I4819 Other persistent atrial fibrillation: Secondary | ICD-10-CM

## 2022-06-05 DIAGNOSIS — J8410 Pulmonary fibrosis, unspecified: Secondary | ICD-10-CM

## 2022-06-05 DIAGNOSIS — R300 Dysuria: Secondary | ICD-10-CM | POA: Diagnosis not present

## 2022-06-05 DIAGNOSIS — R051 Acute cough: Secondary | ICD-10-CM

## 2022-06-05 DIAGNOSIS — I10 Essential (primary) hypertension: Secondary | ICD-10-CM

## 2022-06-05 DIAGNOSIS — N1832 Chronic kidney disease, stage 3b: Secondary | ICD-10-CM

## 2022-06-05 LAB — POC URINALSYSI DIPSTICK (AUTOMATED)
Blood, UA: POSITIVE
Glucose, UA: NEGATIVE
Ketones, UA: POSITIVE
Nitrite, UA: POSITIVE
Protein, UA: POSITIVE — AB
Spec Grav, UA: 1.025 (ref 1.010–1.025)
Urobilinogen, UA: 0.2 E.U./dL
pH, UA: 5 (ref 5.0–8.0)

## 2022-06-05 MED ORDER — CEPHALEXIN 250 MG PO CAPS: 250.0000 mg | ORAL_CAPSULE | Freq: Four times a day (QID) | ORAL | 0 refills | Status: DC

## 2022-06-05 MED ORDER — TRAMADOL HCL 50 MG PO TABS: 50.0000 mg | ORAL_TABLET | Freq: Two times a day (BID) | ORAL | 0 refills | Status: DC | PRN

## 2022-06-05 NOTE — Assessment & Plan Note (Signed)
Currently on Azo, last dose last evening.  She does have UTI symptoms.  UA today suspicious for infection - start keflex '250mg'$  QID 7d course, UCx sent. Update if not improving with treatment for further evaluation.

## 2022-06-05 NOTE — Assessment & Plan Note (Signed)
Carries this diagnosis. CXR without obvious fibrosis. She is largely asymptomatic. Consider high res CT chest if further evaluation necessary.

## 2022-06-05 NOTE — Progress Notes (Signed)
Patient ID: Ann White, female    DOB: October 23, 1945, 76 y.o.   MRN: [id number]  This visit was conducted in person.  BP 104/72   Pulse 68   Temp 97.8 F (36.6 C) (Temporal)   Ht '5\' 3"'$  (1.6 m)   Wt 141 lb 9.6 oz (64.2 kg)   BMI 25.08 kg/m    CC: possible UTI Subjective:   HPI: Ann White is a 76 y.o. female presenting on 06/05/2022 for Dysuria (Urine frequency since last week. Seen at ER Friday no answers given, still in pain. Has taken an Azo today. )   Recent ER visit on Friday for generalized weakness, nausea/vomiting, headache suprapubic pain and dizziness. Records reviewed. Unrevealing evaluation included head CT (chronic ischemic changes including old L lacunar infarcts), CT abd/pelvis, UA, MR brain. Mild acute on chronic kidney disease with GFR from 48 to 40. Mild anemia with Hgb 11.1, Wbc 4.9.  She started feeling ill Friday morning - sudden episode of vertigo that lasted ~6 hours. She had been having some suprapubic discomfort and dysuria, treating with azo. No urgency, frequency, hematuria, flank pain or fevers/chills.  No dyspnea or chest pain.   She does note cough progressive for the past few days.   Afib - on Eliquis and Tikosyn. QT not prolonged at ER EKG.   No further vertigo.   She just received COVID and RSV vaccine on Saturday (day after ER visit) as vertigo symptoms had resolved.      Relevant past medical, surgical, family and social history reviewed and updated as indicated. Interim medical history since our last visit reviewed. Allergies and medications reviewed and updated. Outpatient Medications Prior to Visit  Medication Sig Dispense Refill   acetaminophen (TYLENOL) 500 MG tablet Take 500-1,000 mg by mouth every 6 (six) hours as needed (knee pain.).     apixaban (ELIQUIS) 5 MG TABS tablet Take 1 tablet by mouth twice daily 180 tablet 1   cetirizine (ZYRTEC) 10 MG tablet Take 10 mg by mouth daily as needed for allergies.      Cholecalciferol (VITAMIN D3) 1000 UNITS CAPS Take 1 capsule (1,000 Units total) by mouth daily.     clonazePAM (KLONOPIN) 1 MG tablet Take 1 tablet by mouth twice daily as needed 60 tablet 0   diphenhydrAMINE (BENADRYL) 50 MG capsule Take 50 mg by mouth 1 hour prior to your procedure. 1 capsule 0   docusate sodium (COLACE) 100 MG capsule Take 100-200 mg by mouth daily as needed (constipation.).     dofetilide (TIKOSYN) 125 MCG capsule TAKE 1 CAPSULE BY MOUTH TWO TIMES A DAY 180 capsule 3   Evolocumab (REPATHA SURECLICK) 557 MG/ML SOAJ Inject 1 pen  into the skin every 14 (fourteen) days.     gabapentin (NEURONTIN) 300 MG capsule Take 1 capsule (300 mg total) by mouth at bedtime. 90 capsule 1   losartan (COZAAR) 100 MG tablet Take 1 tablet (100 mg total) by mouth daily. 90 tablet 3   Multiple Vitamin (MULTIVITAMIN WITH MINERALS) TABS tablet Take 1 tablet by mouth daily.     ondansetron (ZOFRAN-ODT) 8 MG disintegrating tablet Take 1 tablet (8 mg total) by mouth every 8 (eight) hours as needed for nausea or vomiting. 10 tablet 0   oxymetazoline (AFRIN) 0.05 % nasal spray Place 1 spray into both nostrils 2 (two) times daily as needed for congestion.     pantoprazole (PROTONIX) 40 MG tablet Take 1 tablet by mouth once daily 90 tablet 3  rosuvastatin (CRESTOR) 5 MG tablet TAKE 1 TABLET BY MOUTH THREE TIMES A WEEK 45 tablet 2   sertraline (ZOLOFT) 100 MG tablet Take 1 tablet by mouth once daily 90 tablet 3   vitamin B-12 (CYANOCOBALAMIN) 500 MCG tablet Take 500 mcg by mouth daily.      traMADol (ULTRAM) 50 MG tablet TAKE 1 TABLET BY MOUTH THREE TIMES DAILY AS NEEDED FOR  KNEE  PAIN 10 tablet 0   No facility-administered medications prior to visit.     Per HPI unless specifically indicated in ROS section below Review of Systems  Objective:  BP 104/72   Pulse 68   Temp 97.8 F (36.6 C) (Temporal)   Ht '5\' 3"'$  (1.6 m)   Wt 141 lb 9.6 oz (64.2 kg)   BMI 25.08 kg/m   Wt Readings from Last 3  Encounters:  06/05/22 141 lb 9.6 oz (64.2 kg)  05/24/22 145 lb (65.8 kg)  02/21/22 147 lb (66.7 kg)      Physical Exam Vitals and nursing note reviewed.  Constitutional:      Appearance: Normal appearance. She is not ill-appearing.  Cardiovascular:     Rate and Rhythm: Normal rate and regular rhythm.     Pulses: Normal pulses.     Heart sounds: Normal heart sounds. No murmur heard.    Comments: Heart sounds regular Pulmonary:     Effort: Pulmonary effort is normal.     Breath sounds: Rhonchi (scattered, especially L side) present. No wheezing.  Abdominal:     General: Bowel sounds are normal. There is no distension.     Palpations: Abdomen is soft. There is no mass.     Tenderness: There is abdominal tenderness (mild-mod) in the suprapubic area. There is no right CVA tenderness, left CVA tenderness, guarding or rebound. Negative signs include Murphy's sign.     Hernia: No hernia is present.  Neurological:     Mental Status: She is alert.       Results for orders placed or performed in visit on 06/05/22  POCT Urinalysis Dipstick (Automated)  Result Value Ref Range   Color, UA orange    Clarity, UA cloudy    Glucose, UA Negative Negative   Bilirubin, UA +    Ketones, UA positive    Spec Grav, UA 1.025 1.010 - 1.025   Blood, UA positive    pH, UA 5.0 5.0 - 8.0   Protein, UA Positive (A) Negative   Urobilinogen, UA 0.2 0.2 or 1.0 E.U./dL   Nitrite, UA positive    Leukocytes, UA Moderate (2+) (A) Negative   Lab Results  Component Value Date   CREATININE 1.37 (H) 06/01/2022   BUN 24 (H) 06/01/2022   NA 140 06/01/2022   K 3.7 06/01/2022   CL 106 06/01/2022   CO2 20 (L) 06/01/2022  GFR = 40.   Assessment & Plan:   Problem List Items Addressed This Visit     PULMONARY FIBROSIS, POSTINFLAMMATORY    Carries this diagnosis. CXR without obvious fibrosis. She is largely asymptomatic. Consider high res CT chest if further evaluation necessary.       Essential  hypertension    BP noted low today - she is on losartan '100mg'$  daily. If staying low, consider dropped dose.      Chronic kidney disease, stage 3b (HCC)   Acute cough    Cough present since the weekend, with abnormal lung sounds on left - will check CXR r/o PNA. No fever.  Relevant Orders   DG Chest 2 View   Persistent atrial fibrillation ([place])    Continue eliquis and tikosyn.       Dysuria - Primary    Currently on Azo, last dose last evening.  She does have UTI symptoms.  UA today suspicious for infection - start keflex '250mg'$  QID 7d course, UCx sent. Update if not improving with treatment for further evaluation.       Relevant Orders   Urine Culture   POCT Urinalysis Dipstick (Automated) (Completed)     Meds ordered this encounter  Medications   traMADol (ULTRAM) 50 MG tablet    Sig: Take 1 tablet (50 mg total) by mouth 2 (two) times daily as needed for moderate pain.    Dispense:  20 tablet    Refill:  0   cephALEXin (KEFLEX) 250 MG capsule    Sig: Take 1 capsule (250 mg total) by mouth in the morning, at noon, in the evening, and at bedtime.    Dispense:  28 capsule    Refill:  0   Orders Placed This Encounter  Procedures   Urine Culture   DG Chest 2 View    Standing Status:   Future    Number of Occurrences:   1    Standing Expiration Date:   06/06/2023    Order Specific Question:   Reason for Exam (SYMPTOM  OR DIAGNOSIS REQUIRED)    Answer:   cough    Order Specific Question:   Preferred imaging location?    Answer:   [facility]   POCT Urinalysis Dipstick (Automated)     Patient Instructions  Xray today Urine checked today - will also send urine culture given symptoms. Start keflex antibiotic sent to pharmacy. Let us know if not improving with treatment.  Tramadol refilled today for knee arthritis.   Follow up plan: Return if symptoms worsen or fail to improve.  Ria Bush, MD

## 2022-06-05 NOTE — Telephone Encounter (Signed)
Pharmacy Patient Advocate Encounter   Received notification from [employer] that prior authorization for traMADol HCl '50MG'$  tablets is required/requested.  Per Test Claim: 7 DAY MAX OPIOID NAIVE   PA submitted on 06/05/22 to [place] via [place]  Status is pending

## 2022-06-05 NOTE — Assessment & Plan Note (Signed)
BP noted low today - she is on losartan '100mg'$  daily. If staying low, consider dropped dose.

## 2022-06-05 NOTE — Patient Instructions (Addendum)
Xray today Urine checked today - will also send urine culture given symptoms. Start keflex antibiotic sent to pharmacy. Let us know if not improving with treatment.  Tramadol refilled today for knee arthritis.

## 2022-06-05 NOTE — Assessment & Plan Note (Addendum)
Cough present since the weekend, with abnormal lung sounds on left - will check CXR r/o PNA. No fever.

## 2022-06-05 NOTE — Assessment & Plan Note (Signed)
Continue eliquis and tikosyn.

## 2022-06-06 NOTE — Telephone Encounter (Signed)
  Received notification from [employer]  that prior authorization is required for traMADol HCl '50MG'$  tablets. PA submitted and APPROVED on 06/05/2022.  Key [id number]  Effective: 06/05/2022 - 07/05/2022   [facility] Patient Advocate

## 2022-06-08 LAB — URINE CULTURE
MICRO NUMBER:: 14218907
SPECIMEN QUALITY:: ADEQUATE

## 2022-06-12 ENCOUNTER — Telehealth: Payer: Self-pay

## 2022-06-12 NOTE — Chronic Care Management (AMB) (Cosign Needed)
Chronic Care Management Pharmacy Assistant   Name: Ann White  MRN: [id number] DOB: 08/09/1945  Reason for Encounter: Hospital Follow Up Non CCM    Medications: Outpatient Encounter Medications as of 06/12/2022  Medication Sig   acetaminophen (TYLENOL) 500 MG tablet Take 500-1,000 mg by mouth every 6 (six) hours as needed (knee pain.).   apixaban (ELIQUIS) 5 MG TABS tablet Take 1 tablet by mouth twice daily   cephALEXin (KEFLEX) 250 MG capsule Take 1 capsule (250 mg total) by mouth in the morning, at noon, in the evening, and at bedtime.   cetirizine (ZYRTEC) 10 MG tablet Take 10 mg by mouth daily as needed for allergies.   Cholecalciferol (VITAMIN D3) 1000 UNITS CAPS Take 1 capsule (1,000 Units total) by mouth daily.   clonazePAM (KLONOPIN) 1 MG tablet Take 1 tablet by mouth twice daily as needed   diphenhydrAMINE (BENADRYL) 50 MG capsule Take 50 mg by mouth 1 hour prior to your procedure.   docusate sodium (COLACE) 100 MG capsule Take 100-200 mg by mouth daily as needed (constipation.).   dofetilide (TIKOSYN) 125 MCG capsule TAKE 1 CAPSULE BY MOUTH TWO TIMES A DAY   Evolocumab (REPATHA SURECLICK) 443 MG/ML SOAJ Inject 1 pen  into the skin every 14 (fourteen) days.   gabapentin (NEURONTIN) 300 MG capsule Take 1 capsule (300 mg total) by mouth at bedtime.   losartan (COZAAR) 100 MG tablet Take 1 tablet (100 mg total) by mouth daily.   Multiple Vitamin (MULTIVITAMIN WITH MINERALS) TABS tablet Take 1 tablet by mouth daily.   ondansetron (ZOFRAN-ODT) 8 MG disintegrating tablet Take 1 tablet (8 mg total) by mouth every 8 (eight) hours as needed for nausea or vomiting.   oxymetazoline (AFRIN) 0.05 % nasal spray Place 1 spray into both nostrils 2 (two) times daily as needed for congestion.   pantoprazole (PROTONIX) 40 MG tablet Take 1 tablet by mouth once daily   rosuvastatin (CRESTOR) 5 MG tablet TAKE 1 TABLET BY MOUTH THREE TIMES A WEEK   sertraline (ZOLOFT) 100 MG tablet Take 1  tablet by mouth once daily   traMADol (ULTRAM) 50 MG tablet Take 1 tablet (50 mg total) by mouth 2 (two) times daily as needed for moderate pain.   vitamin B-12 (CYANOCOBALAMIN) 500 MCG tablet Take 500 mcg by mouth daily.    No facility-administered encounter medications on file as of 06/12/2022.     Reviewed hospital notes for details of recent visit. Has patient been contacted by Transitions of Care team? No Has patient seen PCP/specialist for hospital follow up (summarize OV if yes): Yes  BP noted low today - she is on losartan '100mg'$  daily. If staying low, consider dropped dose. check CXR r/o PNA. UA today, start Keflex '250mg'$  QID 7 day course.Refill Tramadol for knee arthritis .   Admitted to the ED on 06/01/22. Discharge date was 06/01/22.  Discharged from [facility] ED   Discharge diagnosis (Principal Problem): Generalized Weakness Patient was discharged to Home  Brief summary of hospital course:  Cardiac monitor: afib, rate 88. Labs reviewed/interpreted by me - ua neg for uti. Hct c/w prior.  CT reviewed/interpreted by me - no obstruction/hem. Zofran iv. Ivf bolus.  Pt remains feeling 'dizzy', and unsteady when up - will get mri r/o posterior circ cva. MRI reviewed/interpreted by me - no cva. Symptoms improved,  tolerating po. No nv.  Po fluids, ambulate in hall.  Pt appear stable for d/c.  Rec pcp f/u.  Generalized weakness  Nausea and vomiting in adult  Generalized headache  Suprapubic pain   New?Medications Started at [facility] Discharge:?? -Started ondansetron   Medications that remain the same after Hospital Discharge:??  -All other medications will remain the same.    Next CCM appt: none  Other upcoming appts: PCP appointment on 06/05/22  Charlene Brooke, PharmD notified and will determine if action is needed.    Avel Sensor, [place]  [phone]

## 2022-06-14 ENCOUNTER — Other Ambulatory Visit: Payer: Self-pay | Admitting: Internal Medicine

## 2022-06-15 ENCOUNTER — Other Ambulatory Visit: Payer: Self-pay | Admitting: Cardiology

## 2022-07-11 ENCOUNTER — Other Ambulatory Visit: Payer: Self-pay | Admitting: Family Medicine

## 2022-07-11 NOTE — Telephone Encounter (Signed)
Last filled 06-05-22 #20 Last OV 06-05-22 Next OV 07-23-22 [place]

## 2022-07-12 ENCOUNTER — Other Ambulatory Visit: Payer: BC Managed Care – PPO

## 2022-07-12 NOTE — Telephone Encounter (Signed)
ERx 

## 2022-07-13 ENCOUNTER — Other Ambulatory Visit (HOSPITAL_COMMUNITY): Payer: Self-pay

## 2022-07-13 ENCOUNTER — Telehealth: Payer: Self-pay

## 2022-07-13 ENCOUNTER — Ambulatory Visit (INDEPENDENT_AMBULATORY_CARE_PROVIDER_SITE_OTHER): Payer: Medicare Other

## 2022-07-13 VITALS — Ht 63.0 in | Wt 141.0 lb

## 2022-07-13 DIAGNOSIS — Z0000 Encounter for general adult medical examination without abnormal findings: Secondary | ICD-10-CM

## 2022-07-13 NOTE — Telephone Encounter (Signed)
Unable to reach patient for scheduled AWV. No answer. Left message. Okay to reschedule.

## 2022-07-13 NOTE — Patient Instructions (Addendum)
Ann White , Thank you for taking time to come for your Medicare Wellness Visit. I appreciate your ongoing commitment to your health goals. Please review the following plan we discussed and let me know if I can assist you in the future.   These are the goals we discussed:    This is a list of the screening recommended for you and due dates:  Health Maintenance  Topic Date Due   Stool Blood Test  04/01/2021   COVID-19 Vaccine (5 - 2023-24 season) 07/29/2022*   Flu Shot  10/14/2022*   Mammogram  12/26/2022   Colon Cancer Screening  06/19/2023   Medicare Annual Wellness Visit  07/14/2023   DTaP/Tdap/Td vaccine (3 - Td or Tdap) 02/07/2032   Pneumonia Vaccine  Completed   DEXA scan (bone density measurement)  Completed   Hepatitis C Screening: USPSTF Recommendation to screen - Ages 53-79 yo.  Completed   Zoster (Shingles) Vaccine  Completed   HPV Vaccine  Aged Out  *Topic was postponed. The date shown is not the original due date.    Advanced directives: End of life planning; Advance aging; Advanced directives discussed.  Copy of current HCPOA/Living Will requested.    Conditions/risks identified: none new  Next appointment: Follow up in one year for your annual wellness visit    Preventive Care 65 Years and Older, Female Preventive care refers to lifestyle choices and visits with your health care provider that can promote health and wellness. What does preventive care include? A yearly physical exam. This is also called an annual well check. Dental exams once or twice a year. Routine eye exams. Ask your health care provider how often you should have your eyes checked. Personal lifestyle choices, including: Daily care of your teeth and gums. Regular physical activity. Eating a healthy diet. Avoiding tobacco and drug use. Limiting alcohol use. Practicing safe sex. Taking low-dose aspirin every day. Taking vitamin and mineral supplements as recommended by your health care  provider. What happens during an annual well check? The services and screenings done by your health care provider during your annual well check will depend on your age, overall health, lifestyle risk factors, and family history of disease. Counseling  Your health care provider may ask you questions about your: Alcohol use. Tobacco use. Drug use. Emotional well-being. Home and relationship well-being. Sexual activity. Eating habits. History of falls. Memory and ability to understand (cognition). Work and work Statistician. Reproductive health. Screening  You may have the following tests or measurements: Height, weight, and BMI. Blood pressure. Lipid and cholesterol levels. These may be checked every 5 years, or more frequently if you are over 7 years old. Skin check. Lung cancer screening. You may have this screening every year starting at age 43 if you have a 30-pack-year history of smoking and currently smoke or have quit within the past 15 years. Fecal occult blood test (FOBT) of the stool. You may have this test every year starting at age 31. Flexible sigmoidoscopy or colonoscopy. You may have a sigmoidoscopy every 5 years or a colonoscopy every 10 years starting at age 79. Hepatitis C blood test. Hepatitis B blood test. Sexually transmitted disease (STD) testing. Diabetes screening. This is done by checking your blood sugar (glucose) after you have not eaten for a while (fasting). You may have this done every 1-3 years. Bone density scan. This is done to screen for osteoporosis. You may have this done starting at age 41. Mammogram. This may be done every 1-2  years. Talk to your health care provider about how often you should have regular mammograms. Talk with your health care provider about your test results, treatment options, and if necessary, the need for more tests. Vaccines  Your health care provider may recommend certain vaccines, such as: Influenza vaccine. This is  recommended every year. Tetanus, diphtheria, and acellular pertussis (Tdap, Td) vaccine. You may need a Td booster every 10 years. Zoster vaccine. You may need this after age 76. Pneumococcal 13-valent conjugate (PCV13) vaccine. One dose is recommended after age 34. Pneumococcal polysaccharide (PPSV23) vaccine. One dose is recommended after age 68. Talk to your health care provider about which screenings and vaccines you need and how often you need them. This information is not intended to replace advice given to you by your health care provider. Make sure you discuss any questions you have with your health care provider. Document Released: 07/29/2015 Document Revised: 03/21/2016 Document Reviewed: 05/03/2015 Elsevier Interactive Patient Education  2017 [place] Prevention in the Home Falls can cause injuries. They can happen to people of all ages. There are many things you can do to make your home safe and to help prevent falls. What can I do on the outside of my home? Regularly fix the edges of walkways and driveways and fix any cracks. Remove anything that might make you trip as you walk through a door, such as a raised step or threshold. Trim any bushes or trees on the path to your home. Use bright outdoor lighting. Clear any walking paths of anything that might make someone trip, such as rocks or tools. Regularly check to see if handrails are loose or broken. Make sure that both sides of any steps have handrails. Any raised decks and porches should have guardrails on the edges. Have any leaves, snow, or ice cleared regularly. Use sand or salt on walking paths during winter. Clean up any spills in your garage right away. This includes oil or grease spills. What can I do in the bathroom? Use night lights. Install grab bars by the toilet and in the tub and shower. Do not use towel bars as grab bars. Use non-skid mats or decals in the tub or shower. If you need to sit down in  the shower, use a plastic, non-slip stool. Keep the floor dry. Clean up any water that spills on the floor as soon as it happens. Remove soap buildup in the tub or shower regularly. Attach bath mats securely with double-sided non-slip rug tape. Do not have throw rugs and other things on the floor that can make you trip. What can I do in the bedroom? Use night lights. Make sure that you have a light by your bed that is easy to reach. Do not use any sheets or blankets that are too big for your bed. They should not hang down onto the floor. Have a firm chair that has side arms. You can use this for support while you get dressed. Do not have throw rugs and other things on the floor that can make you trip. What can I do in the kitchen? Clean up any spills right away. Avoid walking on wet floors. Keep items that you use a lot in easy-to-reach places. If you need to reach something above you, use a strong step stool that has a grab bar. Keep electrical cords out of the way. Do not use floor polish or wax that makes floors slippery. If you must use wax, use non-skid floor  wax. Do not have throw rugs and other things on the floor that can make you trip. What can I do with my stairs? Do not leave any items on the stairs. Make sure that there are handrails on both sides of the stairs and use them. Fix handrails that are broken or loose. Make sure that handrails are as long as the stairways. Check any carpeting to make sure that it is firmly attached to the stairs. Fix any carpet that is loose or worn. Avoid having throw rugs at the top or bottom of the stairs. If you do have throw rugs, attach them to the floor with carpet tape. Make sure that you have a light switch at the top of the stairs and the bottom of the stairs. If you do not have them, ask someone to add them for you. What else can I do to help prevent falls? Wear shoes that: Do not have high heels. Have rubber bottoms. Are comfortable  and fit you well. Are closed at the toe. Do not wear sandals. If you use a stepladder: Make sure that it is fully opened. Do not climb a closed stepladder. Make sure that both sides of the stepladder are locked into place. Ask someone to hold it for you, if possible. Clearly mark and make sure that you can see: Any grab bars or handrails. First and last steps. Where the edge of each step is. Use tools that help you move around (mobility aids) if they are needed. These include: Canes. Walkers. Scooters. Crutches. Turn on the lights when you go into a dark area. Replace any light bulbs as soon as they burn out. Set up your furniture so you have a clear path. Avoid moving your furniture around. If any of your floors are uneven, fix them. If there are any pets around you, be aware of where they are. Review your medicines with your doctor. Some medicines can make you feel dizzy. This can increase your chance of falling. Ask your doctor what other things that you can do to help prevent falls. This information is not intended to replace advice given to you by your health care provider. Make sure you discuss any questions you have with your health care provider. Document Released: 04/28/2009 Document Revised: 12/08/2015 Document Reviewed: 08/06/2014 Elsevier Interactive Patient Education  2017 [place]. Opioid Pain Medicine Management Opioids are powerful medicines that are used to treat moderate to severe pain. When used for short periods of time, they can help you to: Sleep better. Do better in physical or occupational therapy. Feel better in the first few days after an injury. Recover from surgery. Opioids should be taken with the supervision of a trained health care provider. They should be taken for the shortest period of time possible. This is because opioids can be addictive, and the longer you take opioids, the greater your risk of addiction. This addiction can also be called opioid  use disorder. What are the risks? Using opioid pain medicines for longer than 3 days increases your risk of side effects. Side effects include: Constipation. Nausea and vomiting. Breathing difficulties (respiratory depression). Drowsiness. Confusion. Opioid use disorder. Itching. Taking opioid pain medicine for a long period of time can affect your ability to do daily tasks. It also puts you at risk for: Motor vehicle crashes. Depression. Suicide. Heart attack. Overdose, which can be life-threatening. What is a pain treatment plan? A pain treatment plan is an agreement between you and your health care provider. Pain  is unique to each person, and treatments vary depending on your condition. To manage your pain, you and your health care provider need to work together. To help you do this: Discuss the goals of your treatment, including how much pain you might expect to have and how you will manage the pain. Review the risks and benefits of taking opioid medicines. Remember that a good treatment plan uses more than one approach and minimizes the chance of side effects. Be honest about the amount of medicines you take and about any drug or alcohol use. Get pain medicine prescriptions from only one health care provider. Pain can be managed with many types of alternative treatments. Ask your health care provider to refer you to one or more specialists who can help you manage pain through: Physical or occupational therapy. Counseling (cognitive behavioral therapy). Good nutrition. Biofeedback. Massage. Meditation. Non-opioid medicine. Following a gentle exercise program. How to use opioid pain medicine Taking medicine Take your pain medicine exactly as told by your health care provider. Take it only when you need it. If your pain gets less severe, you may take less than your prescribed dose if your health care provider approves. If you are not having pain, do nottake pain medicine unless  your health care provider tells you to take it. If your pain is severe, do nottry to treat it yourself by taking more pills than instructed on your prescription. Contact your health care provider for help. Write down the times when you take your pain medicine. It is easy to become confused while on pain medicine. Writing the time can help you avoid overdose. Take other over-the-counter or prescription medicines only as told by your health care provider. Keeping yourself and others safe  While you are taking opioid pain medicine: Do not drive, use machinery, or power tools. Do not sign legal documents. Do not drink alcohol. Do not take sleeping pills. Do not supervise children by yourself. Do not do activities that require climbing or being in high places. Do not go to a lake, river, ocean, spa, or swimming pool. Do not share your pain medicine with anyone. Keep pain medicine in a locked cabinet or in a secure area where pets and children cannot reach it. Stopping your use of opioids If you have been taking opioid medicine for more than a few weeks, you may need to slowly decrease (taper) how much you take until you stop completely. Tapering your use of opioids can decrease your risk of symptoms of withdrawal, such as: Pain and cramping in the abdomen. Nausea. Sweating. Sleepiness. Restlessness. Uncontrollable shaking (tremors). Cravings for the medicine. Do not attempt to taper your use of opioids on your own. Talk with your health care provider about how to do this. Your health care provider may prescribe a step-down schedule based on how much medicine you are taking and how long you have been taking it. Getting rid of leftover pills Do not save any leftover pills. Get rid of leftover pills safely by: Taking the medicine to a prescription take-back program. This is usually offered by the county or law enforcement. Bringing them to a pharmacy that has a drug disposal container. Flushing  them down the toilet. Check the label or package insert of your medicine to see whether this is safe to do. Throwing them out in the trash. Check the label or package insert of your medicine to see whether this is safe to do. If it is safe to throw it out, remove  the medicine from the original container, put it into a sealable bag or container, and mix it with used coffee grounds, food scraps, dirt, or cat litter before putting it in the trash. Follow these instructions at home: Activity Do exercises as told by your health care provider. Avoid activities that make your pain worse. Return to your normal activities as told by your health care provider. Ask your health care provider what activities are safe for you. General instructions You may need to take these actions to prevent or treat constipation: Drink enough fluid to keep your urine pale yellow. Take over-the-counter or prescription medicines. Eat foods that are high in fiber, such as beans, whole grains, and fresh fruits and vegetables. Limit foods that are high in fat and processed sugars, such as fried or sweet foods. Keep all follow-up visits. This is important. Where to find support If you have been taking opioids for a long time, you may benefit from receiving support for quitting from a local support group or counselor. Ask your health care provider for a referral to these resources in your area. Where to find more information Centers for Disease Control and Prevention (CDC): http://www.wolf.info/ U.[place]. Food and Drug Administration (FDA): [place] Get help right away if: You may have taken too much of an opioid (overdosed). Common symptoms of an overdose: Your breathing is slower or more shallow than normal. You have a very slow heartbeat (pulse). You have slurred speech. You have nausea and vomiting. Your pupils become very small. You have other potential symptoms: You are very confused. You faint or feel like you will faint. You  have cold, clammy skin. You have blue lips or fingernails. You have thoughts of harming yourself or harming others. These symptoms may represent a serious problem that is an emergency. Do not wait to see if the symptoms will go away. Get medical help right away. Call your local emergency services (911 in the [place] Do not drive yourself to the hospital.  If you ever feel like you may hurt yourself or others, or have thoughts about taking your own life, get help right away. Go to your nearest emergency department or: Call your local emergency services (911 in the [place] Call the [facility] (770)305-4158 in the [place] Call a suicide crisis helpline, such as the [facility] at 608-144-8054 or 988 in the [place]. This is open 24 hours a day in the [place] Text the Crisis Text Line at 901-859-1478 (in the [place].). Summary Opioid medicines can help you manage moderate to severe pain for a short period of time. A pain treatment plan is an agreement between you and your health care provider. Discuss the goals of your treatment, including how much pain you might expect to have and how you will manage the pain. If you think that you or someone else may have taken too much of an opioid, get medical help right away. This information is not intended to replace advice given to you by your health care provider. Make sure you discuss any questions you have with your health care provider. Document Revised: 01/25/2021 Document Reviewed: 10/12/2020 Elsevier Patient Education  [place].

## 2022-07-13 NOTE — Telephone Encounter (Signed)
Pharmacy Patient Advocate Encounter   Received notification from [employer] that prior authorization for traMADol HCl '50MG'$  tablets is required/requested.   PA submitted on 07/13/22 to (ins) [employer] via CoverMyMeds [place] [facility] Status is pending

## 2022-07-13 NOTE — Progress Notes (Signed)
Subjective:   Netty Sullivant is a 76 y.o. female who presents for Medicare Annual (Subsequent) preventive examination.  Review of Systems    No ROS.  Medicare Wellness Virtual Visit.  Visual/audio telehealth visit, UTA vital signs.   See social history for additional risk factors.   Cardiac Risk Factors include: advanced age (>31mn, >>40women);hypertension     Objective:    Today's Vitals   07/13/22 1027  Weight: 141 lb (64 kg)  Height: '5\' 3"'$  (1.6 m)   Body mass index is 24.98 kg/m.     07/13/2022   10:31 AM 06/01/2022    7:32 AM 10/03/2021   11:00 AM 09/29/2021   10:14 AM 07/12/2021    9:46 AM 08/08/2020    3:22 PM 05/17/2020    8:06 AM  Advanced Directives  Does Patient Have a Medical Advance Directive? Yes No No No No No No  Type of [place] will        Does patient want to make changes to medical advance directive? No - Patient declined    Yes (MAU/Ambulatory/Procedural Areas - Information given)    Copy of HC[place]in Chart? No - copy requested        Would patient like information on creating a medical advance directive?  No - Guardian declined No - Patient declined No - Patient declined  Yes (Inpatient - patient requests chaplain consult to create a medical advance directive) Yes (MAU/Ambulatory/Procedural Areas - Information given)    Current Medications (verified) Outpatient Encounter Medications as of 07/13/2022  Medication Sig   acetaminophen (TYLENOL) 500 MG tablet Take 500-1,000 mg by mouth every 6 (six) hours as needed (knee pain.).   apixaban (ELIQUIS) 5 MG TABS tablet Take 1 tablet by mouth twice daily   cephALEXin (KEFLEX) 250 MG capsule Take 1 capsule (250 mg total) by mouth in the morning, at noon, in the evening, and at bedtime.   cetirizine (ZYRTEC) 10 MG tablet Take 10 mg by mouth daily as needed for allergies.   Cholecalciferol (VITAMIN D3) 1000 UNITS CAPS Take 1 capsule (1,000  Units total) by mouth daily.   clonazePAM (KLONOPIN) 1 MG tablet Take 1 tablet by mouth twice daily as needed   diphenhydrAMINE (BENADRYL) 50 MG capsule Take 50 mg by mouth 1 hour prior to your procedure.   docusate sodium (COLACE) 100 MG capsule Take 100-200 mg by mouth daily as needed (constipation.).   dofetilide (TIKOSYN) 125 MCG capsule TAKE ONE CAPSULE BY MOUTH TWICE A DAY   Evolocumab (REPATHA SURECLICK) 1696MG/ML SOAJ INJECT ONE SYRINGEFUL SUBCUTNAEOUSLY EVERY 14 DAYS   gabapentin (NEURONTIN) 300 MG capsule Take 1 capsule (300 mg total) by mouth at bedtime.   losartan (COZAAR) 100 MG tablet Take 1 tablet (100 mg total) by mouth daily.   Multiple Vitamin (MULTIVITAMIN WITH MINERALS) TABS tablet Take 1 tablet by mouth daily.   ondansetron (ZOFRAN-ODT) 8 MG disintegrating tablet Take 1 tablet (8 mg total) by mouth every 8 (eight) hours as needed for nausea or vomiting.   oxymetazoline (AFRIN) 0.05 % nasal spray Place 1 spray into both nostrils 2 (two) times daily as needed for congestion.   pantoprazole (PROTONIX) 40 MG tablet Take 1 tablet by mouth once daily   rosuvastatin (CRESTOR) 5 MG tablet TAKE 1 TABLET BY MOUTH THREE TIMES A WEEK   sertraline (ZOLOFT) 100 MG tablet Take 1 tablet by mouth once daily   traMADol (ULTRAM) 50 MG tablet  TAKE 1 TABLET BY MOUTH TWICE DAILY AS NEEDED FOR MODERATE PAIN   vitamin B-12 (CYANOCOBALAMIN) 500 MCG tablet Take 500 mcg by mouth daily.    No facility-administered encounter medications on file as of 07/13/2022.    Allergies (verified) Sulfa antibiotics, Nexletol [bempedoic acid], Tricor [fenofibrate], Zetia [ezetimibe], Contrast media [iodinated contrast media], Lipitor [atorvastatin], and Pravastatin   History: Past Medical History:  Diagnosis Date   Allergy    Anxiety    Arthritis    Carotid stenosis    a. 02/2016 Carotid U/S: Bilateral 40-59%; b. 03/2017 Carotid U/S: RICA 19-62%, LICA 2-29%; c. 79/8921 Carotid U/S: bilat 40-59% ICA dzs.    Cervical spondylosis    s/p spine injections   CKD (chronic kidney disease), stage III (HCC)    Depression    DVT (deep venous thrombosis) ([place])    RLE DVT 05/27/15   Dysrhythmia    afib   Ectatic abdominal aorta ([place]) 2016   rpt [place] 5 yrs   GERD (gastroesophageal reflux disease)    History of chicken pox    History of diverticulitis of colon    HLD (hyperlipidemia)    HTN (hypertension)    Irritable bowel syndrome with constipation    Lichen sclerosus et atrophicus    NICM (nonischemic cardiomyopathy) ([place])    a. 04/2017 Echo: EF 35-40%, diff HK; b. 05/2017 MV: EF 56%, no ischemia/infarct; c. 04/2018 Echo: EF 55-60%, no rwma, Gr2 DD; d. 03/2020 Echo: EF 40-45%, nl PASP, mod dil LA, mild to mod dil RA, Triv MR.   Obesity    Persistent atrial fibrillation ([place])    a. Dx 03/2020. CHA2DS2VASc = 5-->xarelto '15mg'$  daily.   Premature atrial contraction    a. 04/2017 48hr Holter: Predominant rhythm - sinus. Rare PVC's, freq PAC's with freq atrial runs up to 34 beats-->metoprolol started.   Seasonal allergic rhinitis    Past Surgical History:  Procedure Laterality Date   ABDOMINAL AORTIC ANEURYSM REPAIR  09/2021   Donzetta Matters   ABDOMINAL AORTIC ENDOVASCULAR STENT GRAFT  10/03/2021   Procedure: ABDOMINAL AORTIC ENDOVASCULAR STENT GRAFT;  Surgeon: Waynetta Sandy, MD;  Location: [facility];  Service: Vascular;;   ABDOMINAL HYSTERECTOMY     bladder tack  1990s   BREAST BIOPSY Right 03/05/2012    benign   CARDIOVERSION N/A 05/17/2020   Procedure: CARDIOVERSION;  Surgeon: Nelva Bush, MD;  Location: [facility];  Service: Cardiovascular;  Laterality: N/A;   CARPAL TUNNEL RELEASE  2004, 2013   bilateral   CHOLECYSTECTOMY  1990s   COLONOSCOPY  2002   COLONOSCOPY  06/2013   mod diverticulosis, 3 tubular adenomas, int hem rpt 5 yrs [facility])   COLONOSCOPY  06/2018   TA,c olonic angiodysplastic lesion, mod diverticulosis, rpt 5 yrs [facility])   DEXA  07/2011   normal, T score -0.3    ESOPHAGOGASTRODUODENOSCOPY ENDOSCOPY  2004   normal ([place])   TOTAL ABDOMINAL HYSTERECTOMY W/ BILATERAL SALPINGOOPHORECTOMY  1990s   complete, dysmenorrhea and fibroids   ULTRASOUND GUIDANCE FOR VASCULAR ACCESS  10/03/2021   Procedure: ULTRASOUND GUIDANCE FOR VASCULAR ACCESS;  Surgeon: Waynetta Sandy, MD;  Location: [facility] OR;  Service: Vascular;;   Family History  Problem Relation Age of Onset   CAD Mother 61       MI   Hypertension Mother    Parkinson's disease Father    Diabetes Father    CAD Father 76       MI   Hyperlipidemia Sister    Cancer Neg  Hx    Stroke Neg Hx    Colon cancer Neg Hx    Breast cancer Neg Hx    Colon polyps Neg Hx    Esophageal cancer Neg Hx    Stomach cancer Neg Hx    Rectal cancer Neg Hx    Social History   Socioeconomic History   Marital status: Married    Spouse name: Freddie   Number of children: 2   Years of education: Not on file   Highest education level: Not on file  Occupational History   Occupation: retired   Tobacco Use   Smoking status: Former    Packs/day: 0.25    Years: 3.00    Total pack years: 0.75    Types: Cigarettes    Quit date: 1990    Years since quitting: 34.0   Smokeless tobacco: Never   Tobacco comments:    minimal smoking history  Vaping Use   Vaping Use: Never used  Substance and Sexual Activity   Alcohol use: No    Alcohol/week: 0.0 standard drinks of alcohol   Drug use: No   Sexual activity: Not Currently  Other Topics Concern   Not on file  Social History Narrative   Lives with husband and 2 cats.  2 grown children, 5 grandchildren.   Occupation: retired, worked in [employer]   Activity: no regular exercise.  Does walk in summer   Diet: fruits/vegetables daily, good water.   Social Determinants of Health   Financial Resource Strain: Low Risk  (07/13/2022)   Overall Financial Resource Strain (CARDIA)    Difficulty of Paying Living Expenses: Not very hard  Food Insecurity: No Food Insecurity  (07/13/2022)   Hunger Vital Sign    Worried About Running Out of Food in the Last Year: Never true    Ran Out of Food in the Last Year: Never true  Transportation Needs: No Transportation Needs (07/13/2022)   PRAPARE - Hydrologist (Medical): No    Lack of Transportation (Non-Medical): No  Physical Activity: Inactive (07/12/2021)   Exercise Vital Sign    Days of Exercise per Week: 0 days    Minutes of Exercise per Session: 0 min  Stress: No Stress Concern Present (07/13/2022)   [place]    Feeling of Stress : Not at all  Social Connections: Moderately Integrated (07/13/2022)   Social Connection and Isolation Panel [NHANES]    Frequency of Communication with Friends and Family: More than three times a week    Frequency of Social Gatherings with Friends and Family: More than three times a week    Attends Religious Services: More than 4 times per year    Active Member of [employer] or Organizations: No    Attends Music therapist: Never    Marital Status: Married    Tobacco Counseling Counseling given: Not Answered Tobacco comments: minimal smoking history   Clinical Intake:  Pre-visit preparation completed: Yes        Diabetes: No  How often do you need to have someone help you when you read instructions, pamphlets, or other written materials from your doctor or pharmacy?: 1 - Never    Interpreter Needed?: No      Activities of Daily Living    07/13/2022   10:32 AM 10/03/2021   10:53 AM  In your present state of health, do you have any difficulty performing the following activities:  Hearing? 0 0  Vision? 0 0  Difficulty concentrating or making decisions? 0 0  Walking or climbing stairs? 0 0  Dressing or bathing? 0 0  Doing errands, shopping? 0 0  Preparing Food and eating ? N   Using the Toilet? N   In the past six months, have you accidently leaked  urine? N   Do you have problems with loss of bowel control? N   Managing your Medications? N   Managing your Finances? N   Housekeeping or managing your Housekeeping? N     Patient Care Team: Ria Bush, MD as PCP - General (Family Medicine) End, Harrell Gave, MD as PCP - Cardiology (Cardiology) Vickie Epley, MD as PCP - Electrophysiology (Cardiology) Jovita Gamma, MD as Consulting Physician (Neurosurgery) Neil Crouch, OD as Referring Physician (Optometry)  Indicate any recent Medical Services you may have received from other than [facility] providers in the past year (date may be approximate).     Assessment:   This is a routine wellness examination for [facility].  I connected with  [name] on 07/13/22 by a audio enabled telemedicine application and verified that I am speaking with the correct person using two identifiers.  Patient Location: Home  Provider Location: Office/Clinic  I discussed the limitations of evaluation and management by telemedicine. The patient expressed understanding and agreed to proceed.   Hearing/Vision screen Hearing Screening - Comments:: Patient is able to hear conversational tones without difficulty.  No issues reported.   Vision Screening - Comments:: Last exam 2023, Dr. Renaldo Fiddler, wear glasses  Dietary issues and exercise activities discussed: Current Exercise Habits: Home exercise routine, Intensity: Mild   Goals Addressed             This Visit's Progress    Maintain healthy lifestyle       Stay hydrated Stay active Healthy diet       Depression Screen    07/13/2022   10:30 AM 07/12/2021    9:50 AM 12/14/2020   12:14 PM 03/25/2020   10:11 AM 03/25/2019   11:01 AM 03/06/2018    9:02 AM 02/26/2017    1:56 PM  PHQ 2/9 Scores  PHQ - 2 Score 0 0 0 0 0 0 0  PHQ- 9 Score   1 0 1 0 1    Fall Risk    07/13/2022   10:32 AM 07/12/2021    9:48 AM 03/25/2020   10:10 AM 03/06/2018    9:02 AM 02/26/2017    1:56 PM  Fall  Risk   Falls in the past year? 0 0 0 No No  Number falls in past yr: 0 0 0    Injury with Fall? 0 0 0    Risk for fall due to :  No Fall Risks Medication side effect    Follow up Falls evaluation completed;Falls prevention discussed Falls prevention discussed Falls evaluation completed;Falls prevention discussed      FALL RISK PREVENTION PERTAINING TO THE HOME: Home free of loose throw rugs in walkways, pet beds, electrical cords, etc? Yes  Adequate lighting in your home to reduce risk of falls? Yes   ASSISTIVE DEVICES UTILIZED TO PREVENT FALLS: Life alert? No  Use of a cane, walker or w/c? No   TIMED UP AND GO: Was the test performed? No .   Cognitive Function: Patient is alert and oriented x3.  100% independent.      03/25/2020   10:13 AM 03/06/2018    9:45 AM 02/26/2017    1:48  PM 02/21/2016    8:49 AM  MMSE - Mini Mental State Exam  Orientation to time '5 5 5 5  '$ Orientation to Place '5 5 5 5  '$ Registration '3 3 3 3  '$ Attention/ Calculation 5 0 0 0  Recall '3 3 3 3  '$ Language- name 2 objects  0 0 0  Language- repeat '1 1 1 1  '$ Language- follow 3 step command  '3 3 3  '$ Language- read & follow direction  0 0 0  Write a sentence  0 0 0  Copy design  0 0 0  Total score  '20 20 20        '$ 07/13/2022   10:33 AM  6CIT Screen  What Year? 0 points  What month? 0 points  What time? 0 points    Immunizations Immunization History  Administered Date(s) Administered   Fluad Quad(high Dose 65+) 03/25/2019, 03/30/2020   Influenza, High Dose Seasonal PF 04/10/2018, 03/24/2021   Influenza,inj,Quad PF,6+ Mos 03/16/2015   Influenza-Unspecified 04/15/2013, 04/15/2014, 04/16/2016, 04/15/2017   PFIZER(Purple Top)SARS-COV-2 Vaccination 08/02/2019, 08/22/2019, 05/02/2020   PNEUMOCOCCAL CONJUGATE-20 11/27/2021   Pfizer Covid-19 Vaccine Bivalent Booster 58yr & up 04/27/2021   Pneumococcal Conjugate-13 08/28/2013   Pneumococcal Polysaccharide-23 08/27/2012   Tdap 08/27/2012, 02/06/2022    Zoster Recombinat (Shingrix) 11/27/2021, 02/06/2022   Zoster, Live 07/16/2010   Screening Tests Health Maintenance  Topic Date Due   COLON CANCER SCREENING ANNUAL FOBT  04/01/2021   COVID-19 Vaccine (5 - 2023-24 season) 07/29/2022 (Originally 03/16/2022)   INFLUENZA VACCINE  10/14/2022 (Originally 02/13/2022)   MAMMOGRAM  12/26/2022   COLONOSCOPY (Pts [phone] coverage will need to be confirmed)  06/19/2023   Medicare Annual Wellness (AWV)  07/14/2023   DTaP/Tdap/Td (3 - Td or Tdap) 02/07/2032   Pneumonia Vaccine 6577Years old  Completed   DEXA SCAN  Completed   Hepatitis C Screening  Completed   Zoster Vaccines- Shingrix  Completed   HPV VACCINES  Aged Out    Health Maintenance  Health Maintenance Due  Topic Date Due   COLON CANCER SCREENING ANNUAL FOBT  04/01/2021   Colon cancer screening annual FOBT- deferred for follow up at upcoming cpe.   DG chest View 2 completed- 06/07/22.   Vision Screening: Recommended annual ophthalmology exams for early detection of glaucoma and other disorders of the eye.  Dental Screening: Recommended annual dental exams for proper oral hygiene.  Community Resource Referral / Chronic Care Management: CRR required this visit?  No   CCM required this visit?  No      Plan:     I have personally reviewed and noted the following in the patient's chart:   Medical and social history Use of alcohol, tobacco or illicit drugs  Current medications and supplements including opioid prescriptions. Patient is currently taking opioid prescriptions. Information provided to patient regarding non-opioid alternatives. Patient advised to discuss non-opioid treatment plan with their provider.Taking tramadol.Followed by PCP.  Functional ability and status Nutritional status Physical activity Advanced directives List of other physicians Hospitalizations, surgeries, and ER visits in previous 12 months Vitals Screenings to include cognitive,  depression, and falls Referrals and appointments  In addition, I have reviewed and discussed with patient certain preventive protocols, quality metrics, and best practice recommendations. A written personalized care plan for preventive services as well as general preventive health recommendations were provided to patient.     DeLeta JunglingLPN   [id number]

## 2022-07-13 NOTE — Telephone Encounter (Signed)
Patient Advocate Encounter  Prior Authorization for traMADol HCl '50MG'$  tablets has been approved.    PA# [id number] Effective dates: 07/13/22 through 08/13/22

## 2022-07-16 ENCOUNTER — Other Ambulatory Visit: Payer: Self-pay | Admitting: Family Medicine

## 2022-07-16 DIAGNOSIS — R7303 Prediabetes: Secondary | ICD-10-CM

## 2022-07-16 DIAGNOSIS — N1832 Chronic kidney disease, stage 3b: Secondary | ICD-10-CM

## 2022-07-16 DIAGNOSIS — E7849 Other hyperlipidemia: Secondary | ICD-10-CM

## 2022-07-18 ENCOUNTER — Other Ambulatory Visit (INDEPENDENT_AMBULATORY_CARE_PROVIDER_SITE_OTHER): Payer: Medicare Other

## 2022-07-18 DIAGNOSIS — M1832 Unilateral post-traumatic osteoarthritis of first carpometacarpal joint, left hand: Secondary | ICD-10-CM

## 2022-07-18 DIAGNOSIS — E7849 Other hyperlipidemia: Secondary | ICD-10-CM

## 2022-07-18 DIAGNOSIS — N1832 Chronic kidney disease, stage 3b: Secondary | ICD-10-CM

## 2022-07-18 DIAGNOSIS — D631 Anemia in chronic kidney disease: Secondary | ICD-10-CM

## 2022-07-18 DIAGNOSIS — R7303 Prediabetes: Secondary | ICD-10-CM | POA: Diagnosis not present

## 2022-07-18 LAB — COMPREHENSIVE METABOLIC PANEL
ALT: 7 U/L (ref 0–35)
AST: 17 U/L (ref 0–37)
Albumin: 4.6 g/dL (ref 3.5–5.2)
Alkaline Phosphatase: 42 U/L (ref 39–117)
BUN: 32 mg/dL — ABNORMAL HIGH (ref 6–23)
CO2: 27 mEq/L (ref 19–32)
Calcium: 9.8 mg/dL (ref 8.4–10.5)
Chloride: 105 mEq/L (ref 96–112)
Creatinine, Ser: 1.28 mg/dL — ABNORMAL HIGH (ref 0.40–1.20)
GFR: 41.06 mL/min — ABNORMAL LOW (ref >60.00)
Glucose, Bld: 95 mg/dL (ref 70–99)
Potassium: 4.9 mEq/L (ref 3.5–5.1)
Sodium: 141 mEq/L (ref 135–145)
Total Bilirubin: 0.3 mg/dL (ref 0.2–1.2)
Total Protein: 7.2 g/dL (ref 6.0–8.3)

## 2022-07-18 LAB — CBC WITH DIFFERENTIAL/PLATELET
Basophils Absolute: 0 10*3/uL (ref 0.0–0.1)
Basophils Relative: 0.9 % (ref 0.0–3.0)
Eosinophils Absolute: 0.2 10*3/uL (ref 0.0–0.7)
Eosinophils Relative: 5.6 % — ABNORMAL HIGH (ref 0.0–5.0)
HCT: 32.7 % — ABNORMAL LOW (ref 36.0–46.0)
Hemoglobin: 10.7 g/dL — ABNORMAL LOW (ref 12.0–15.0)
Lymphocytes Relative: 34.3 % (ref 12.0–46.0)
Lymphs Abs: 1.2 10*3/uL (ref 0.7–4.0)
MCHC: 32.8 g/dL (ref 30.0–36.0)
MCV: 88.6 fl (ref 78.0–100.0)
Monocytes Absolute: 0.2 10*3/uL (ref 0.1–1.0)
Monocytes Relative: 4.7 % (ref 3.0–12.0)
Neutro Abs: 1.9 10*3/uL (ref 1.4–7.7)
Neutrophils Relative %: 54.5 % (ref 43.0–77.0)
Platelets: 186 10*3/uL (ref 150.0–400.0)
RBC: 3.69 Mil/uL — ABNORMAL LOW (ref 3.87–5.11)
RDW: 14.6 % (ref 11.5–15.5)
WBC: 3.6 10*3/uL — ABNORMAL LOW (ref 4.0–10.5)

## 2022-07-18 LAB — HEMOGLOBIN A1C: Hgb A1c MFr Bld: 6.2 % (ref 4.6–6.5)

## 2022-07-18 LAB — MICROALBUMIN / CREATININE URINE RATIO
Creatinine,U: 90 mg/dL
Microalb Creat Ratio: 3.5 mg/g (ref 0.0–30.0)
Microalb, Ur: 3.2 mg/dL — ABNORMAL HIGH (ref 0.0–1.9)

## 2022-07-18 LAB — VITAMIN D 25 HYDROXY (VIT D DEFICIENCY, FRACTURES): VITD: 43.48 ng/mL (ref 30.00–100.00)

## 2022-07-19 LAB — PARATHYROID HORMONE, INTACT (NO CA): PTH: 52 pg/mL (ref 16–77)

## 2022-07-21 NOTE — Progress Notes (Deleted)
    Ann White Verde, MD, [name] [place], [address]  Phone: [phone]  FAX: [place] - 77 y.o. female  MRN [id number]  Date of Birth: October 30, 1945  Date: 07/23/2022  PCP: Ann Bush, MD  Referral: Ann Bush, MD  No chief complaint on file.  Subjective:   Ann White is a 77 y.o. very pleasant female patient with There is no height or weight on file to calculate BMI. who presents with the following:  R knee with arthritis.  Last knee x-rays in 2017 showed moderate OA.   I last saw her 12/2021, and I did a intraarticular corticosteroid injection at that time.     Review of Systems is noted in the HPI, as appropriate  Objective:   There were no vitals taken for this visit.  GEN: No acute distress; alert,appropriate. PULM: Breathing comfortably in no respiratory distress PSYCH: Normally interactive.   Laboratory and Imaging Data:  Assessment and Plan:   ***

## 2022-07-23 ENCOUNTER — Ambulatory Visit: Payer: Medicare Other | Admitting: Family Medicine

## 2022-07-23 ENCOUNTER — Encounter: Payer: Self-pay | Admitting: Family Medicine

## 2022-07-23 ENCOUNTER — Ambulatory Visit (INDEPENDENT_AMBULATORY_CARE_PROVIDER_SITE_OTHER): Payer: Medicare Other | Admitting: Family Medicine

## 2022-07-23 VITALS — BP 134/82 | HR 62 | Ht 63.75 in | Wt 139.5 lb

## 2022-07-23 DIAGNOSIS — I10 Essential (primary) hypertension: Secondary | ICD-10-CM | POA: Diagnosis not present

## 2022-07-23 DIAGNOSIS — K219 Gastro-esophageal reflux disease without esophagitis: Secondary | ICD-10-CM | POA: Diagnosis not present

## 2022-07-23 DIAGNOSIS — I4819 Other persistent atrial fibrillation: Secondary | ICD-10-CM

## 2022-07-23 DIAGNOSIS — F5104 Psychophysiologic insomnia: Secondary | ICD-10-CM

## 2022-07-23 DIAGNOSIS — N1832 Chronic kidney disease, stage 3b: Secondary | ICD-10-CM

## 2022-07-23 DIAGNOSIS — I5022 Chronic systolic (congestive) heart failure: Secondary | ICD-10-CM

## 2022-07-23 DIAGNOSIS — Z95828 Presence of other vascular implants and grafts: Secondary | ICD-10-CM | POA: Diagnosis not present

## 2022-07-23 DIAGNOSIS — E7849 Other hyperlipidemia: Secondary | ICD-10-CM

## 2022-07-23 DIAGNOSIS — J8410 Pulmonary fibrosis, unspecified: Secondary | ICD-10-CM | POA: Diagnosis not present

## 2022-07-23 DIAGNOSIS — F331 Major depressive disorder, recurrent, moderate: Secondary | ICD-10-CM

## 2022-07-23 DIAGNOSIS — I6523 Occlusion and stenosis of bilateral carotid arteries: Secondary | ICD-10-CM

## 2022-07-23 DIAGNOSIS — Z0001 Encounter for general adult medical examination with abnormal findings: Secondary | ICD-10-CM

## 2022-07-23 DIAGNOSIS — Z7189 Other specified counseling: Secondary | ICD-10-CM

## 2022-07-23 DIAGNOSIS — I428 Other cardiomyopathies: Secondary | ICD-10-CM

## 2022-07-23 DIAGNOSIS — R7303 Prediabetes: Secondary | ICD-10-CM

## 2022-07-23 DIAGNOSIS — D631 Anemia in chronic kidney disease: Secondary | ICD-10-CM

## 2022-07-23 DIAGNOSIS — R413 Other amnesia: Secondary | ICD-10-CM

## 2022-07-23 MED ORDER — PANTOPRAZOLE SODIUM 40 MG PO TBEC: 40.0000 mg | DELAYED_RELEASE_TABLET | Freq: Two times a day (BID) | ORAL | 3 refills | Status: DC

## 2022-07-23 MED ORDER — FAMOTIDINE 20 MG PO TABS: 20.0000 mg | ORAL_TABLET | Freq: Every day | ORAL | 1 refills | Status: DC

## 2022-07-23 NOTE — Assessment & Plan Note (Signed)
Preventative protocols reviewed and updated unless pt declined. Discussed healthy diet and lifestyle.  

## 2022-07-23 NOTE — Progress Notes (Unsigned)
Patient ID: Ann White, female    DOB: 09/06/1945, 77 y.o.   MRN: [id number]  This visit was conducted in person.  BP 134/82   Pulse 62   Ht 5' 3.75" (1.619 m)   Wt 139 lb 8 oz (63.3 kg)   SpO2 93%   BMI 24.13 kg/m    CC: CPE Subjective:   HPI: Ann White is a 77 y.o. female presenting on 07/23/2022 for Annual Exam [facility] prt 2 [AWV 07/13/22].  Pt accompanied by husband, [name]. )   Saw health advisor last week for medicare wellness visit. Note reviewed.   No results found.  Flowsheet Row Clinical Support from 07/13/2022 in [place] at [place]  PHQ-2 Total Score 0          07/13/2022   10:32 AM 07/12/2021    9:48 AM 03/25/2020   10:10 AM 03/06/2018    9:02 AM 02/26/2017    1:56 PM  [place] in the past year? 0 0 0 No No  Number falls in past yr: 0 0 0    Injury with Fall? 0 0 0    Risk for fall due to :  No Fall Risks Medication side effect    Follow up Falls evaluation completed;Falls prevention discussed Falls prevention discussed Falls evaluation completed;Falls prevention discussed      Sees Dr Quentin Ore for afib now on eliquis and tikosyn. Also sees Dr End for non ischemic CM and lipid clinic for familial hyperlipidemia - stable period on losartan '25mg'$ . Also has stable moderate BICA disease. She is taking Repatha q2 weeks for cholesterol control, as well as crestor three times a week.   Seen 05/2022 with UTI treated with keflex course. UCx returned Klebsiella.   AAA s/p endovascular repair by Dr Donzetta Matters [facility] 2023.   Post-inflammatory pulm fibrosis - noted in chart - pt denies history of this or respiratory symptoms, recent lung imaging overall normal.   Noticing increasing anxiety. Noticing worsening memory. Husband endorses both. Ongoing for 6 months. No trouble getting lost when driving.  Recent indigestion with decreased appetite - managed with tums and gasX. Weight overall stable.    Preventative: Colon cancer screening -  colonoscopy 06/2013 mod diverticulosis, 3 tubular adenomas, int hem rpt 5 yrs [facility] Plan)  COLONOSCOPY 06/2018 - TA, colonic angiodysplastic lesion, mod diverticulosis, rpt 5 yrs [facility] Plan)  Well woman - with prior PCP Dr. Rocky Link. S/p hysterectomy and bilat oophorectomy for benign reason 1990s.  [facility]  DEXA 2013 - WNL Lung cancer screening - not eligible  Flu shot yearly COVID vaccine - [place] 07/2019, 08/2019, booster 04/2020, bivalent 04/2021, again 05/2022 RSV 05/2022 Pneumovax 2014. EXNTZGY-17 08/2013 Tdap 2014 zostavax 2012.  Shingrix - 11/2021, 01/2022 Advanced directives: Husband then daughter are HCPOA. Has packet at home but needs to get notarized. Asked to bring me copy.  Seat belt use discussed  Sunscreen use discussed. No changing moles on skin.  Non smokers  EtOH - none Dentist Q6 mo Eye exam yearly  Bowel - no constipation, occasional diarrhea Bladder - no incontinence    Lives with husband and 2 cats. 2 grown children, 5 grandchildren  Occupation: retired, worked in [employer]  Activity: regular walking Diet: fruits/vegetables daily, good water      Relevant past medical, surgical, family and social history reviewed and updated as indicated. Interim medical history since our last visit reviewed. Allergies and medications reviewed and updated. Outpatient  Medications Prior to Visit  Medication Sig Dispense Refill   acetaminophen (TYLENOL) 500 MG tablet Take 500-1,000 mg by mouth every 6 (six) hours as needed (knee pain.).     apixaban (ELIQUIS) 5 MG TABS tablet Take 1 tablet by mouth twice daily 180 tablet 1   cephALEXin (KEFLEX) 250 MG capsule Take 1 capsule (250 mg total) by mouth in the morning, at noon, in the evening, and at bedtime. 28 capsule 0   cetirizine (ZYRTEC) 10 MG tablet Take 10 mg by mouth daily as needed for allergies.     Cholecalciferol (VITAMIN D3) 1000 UNITS CAPS Take 1 capsule (1,000 Units total) by mouth daily.      clonazePAM (KLONOPIN) 1 MG tablet Take 1 tablet by mouth twice daily as needed 60 tablet 0   diphenhydrAMINE (BENADRYL) 50 MG capsule Take 50 mg by mouth 1 hour prior to your procedure. 1 capsule 0   docusate sodium (COLACE) 100 MG capsule Take 100-200 mg by mouth daily as needed (constipation.).     dofetilide (TIKOSYN) 125 MCG capsule TAKE ONE CAPSULE BY MOUTH TWICE A DAY 180 capsule 3   Evolocumab (REPATHA SURECLICK) 790 MG/ML SOAJ INJECT ONE SYRINGEFUL SUBCUTNAEOUSLY EVERY 14 DAYS 2 mL 10   gabapentin (NEURONTIN) 300 MG capsule Take 1 capsule (300 mg total) by mouth at bedtime. 90 capsule 1   losartan (COZAAR) 100 MG tablet Take 1 tablet (100 mg total) by mouth daily. 90 tablet 3   Multiple Vitamin (MULTIVITAMIN WITH MINERALS) TABS tablet Take 1 tablet by mouth daily.     ondansetron (ZOFRAN-ODT) 8 MG disintegrating tablet Take 1 tablet (8 mg total) by mouth every 8 (eight) hours as needed for nausea or vomiting. 10 tablet 0   oxymetazoline (AFRIN) 0.05 % nasal spray Place 1 spray into both nostrils 2 (two) times daily as needed for congestion.     rosuvastatin (CRESTOR) 5 MG tablet TAKE 1 TABLET BY MOUTH THREE TIMES A WEEK 45 tablet 2   sertraline (ZOLOFT) 100 MG tablet Take 1 tablet by mouth once daily 90 tablet 3   traMADol (ULTRAM) 50 MG tablet TAKE 1 TABLET BY MOUTH TWICE DAILY AS NEEDED FOR MODERATE PAIN 20 tablet 0   vitamin B-12 (CYANOCOBALAMIN) 500 MCG tablet Take 500 mcg by mouth daily.      pantoprazole (PROTONIX) 40 MG tablet Take 1 tablet by mouth once daily 90 tablet 3   No facility-administered medications prior to visit.     Per HPI unless specifically indicated in ROS section below Review of Systems  Constitutional:  Negative for activity change, appetite change, chills, fatigue, fever and unexpected weight change.  HENT:  Negative for hearing loss.   Eyes:  Negative for visual disturbance.  Respiratory:  Negative for cough, chest tightness, shortness of breath and  wheezing.   Cardiovascular:  Negative for chest pain, palpitations and leg swelling.  Gastrointestinal:  Negative for abdominal distention, abdominal pain, blood in stool, constipation, diarrhea, nausea and vomiting.  Genitourinary:  Negative for difficulty urinating and hematuria.  Musculoskeletal:  Negative for arthralgias, myalgias and neck pain.  Skin:  Negative for rash.  Neurological:  Negative for dizziness, seizures, syncope and headaches.  Hematological:  Negative for adenopathy. Does not bruise/bleed easily.  Psychiatric/Behavioral:  Negative for dysphoric mood. The patient is not nervous/anxious.     Objective:  BP 134/82   Pulse 62   Ht 5' 3.75" (1.619 m)   Wt 139 lb 8 oz (63.3 kg)   SpO2 93%  BMI 24.13 kg/m   Wt Readings from Last 3 Encounters:  07/23/22 139 lb 8 oz (63.3 kg)  07/13/22 141 lb (64 kg)  06/05/22 141 lb 9.6 oz (64.2 kg)      Physical Exam Vitals and nursing note reviewed.  Constitutional:      Appearance: Normal appearance. She is not ill-appearing.  HENT:     Head: Normocephalic and atraumatic.     Right Ear: Tympanic membrane, ear canal and external ear normal. There is no impacted cerumen.     Left Ear: Tympanic membrane, ear canal and external ear normal. There is no impacted cerumen.     Mouth/Throat:     Mouth: Mucous membranes are moist.     Pharynx: Oropharynx is clear. No oropharyngeal exudate or posterior oropharyngeal erythema.  Eyes:     General:        Right eye: No discharge.        Left eye: No discharge.     Extraocular Movements: Extraocular movements intact.     Conjunctiva/sclera: Conjunctivae normal.     Pupils: Pupils are equal, round, and reactive to light.  Neck:     Thyroid: No thyroid mass or thyromegaly.     Vascular: No carotid bruit.  Cardiovascular:     Rate and Rhythm: Normal rate and regular rhythm.     Pulses: Normal pulses.     Heart sounds: Normal heart sounds. No murmur heard. Pulmonary:     Effort:  Pulmonary effort is normal. No respiratory distress.     Breath sounds: Normal breath sounds. No wheezing, rhonchi or rales.  Abdominal:     General: Bowel sounds are normal. There is no distension.     Palpations: Abdomen is soft. There is no mass.     Tenderness: There is no abdominal tenderness. There is no guarding or rebound.     Hernia: No hernia is present.  Musculoskeletal:     Cervical back: Normal range of motion and neck supple. No rigidity.     Right lower leg: No edema.     Left lower leg: No edema.  Lymphadenopathy:     Cervical: No cervical adenopathy.  Skin:    General: Skin is warm and dry.     Findings: No rash.  Neurological:     General: No focal deficit present.     Mental Status: She is alert. Mental status is at baseline.  Psychiatric:        Mood and Affect: Mood normal.        Behavior: Behavior normal.       Results for orders placed or performed in visit on 07/18/22  Parathyroid hormone, intact (no Ca)  Result Value Ref Range   PTH 52 16 - 77 pg/mL  Microalbumin / creatinine urine ratio  Result Value Ref Range   Microalb, Ur 3.2 (H) 0.0 - 1.9 mg/dL   Creatinine,U 90.0 mg/dL   Microalb Creat Ratio 3.5 0.0 - 30.0 mg/g  CBC with Differential/Platelet  Result Value Ref Range   WBC 3.6 (L) 4.0 - 10.5 K/uL   RBC 3.69 (L) 3.87 - 5.11 Mil/uL   Hemoglobin 10.7 (L) 12.0 - 15.0 g/dL   HCT 32.7 (L) 36.0 - 46.0 %   MCV 88.6 78.0 - 100.0 fl   MCHC 32.8 30.0 - 36.0 g/dL   RDW 14.6 11.5 - 15.5 %   Platelets 186.0 150.0 - 400.0 K/uL   Neutrophils Relative % 54.5 43.0 - 77.0 %   Lymphocytes  Relative 34.3 12.0 - 46.0 %   Monocytes Relative 4.7 3.0 - 12.0 %   Eosinophils Relative 5.6 (H) 0.0 - 5.0 %   Basophils Relative 0.9 0.0 - 3.0 %   Neutro Abs 1.9 1.4 - 7.7 K/uL   Lymphs Abs 1.2 0.7 - 4.0 K/uL   Monocytes Absolute 0.2 0.1 - 1.0 K/uL   Eosinophils Absolute 0.2 0.0 - 0.7 K/uL   Basophils Absolute 0.0 0.0 - 0.1 K/uL  Hemoglobin A1c  Result Value Ref  Range   Hgb A1c MFr Bld 6.2 4.6 - 6.5 %  Comprehensive metabolic panel  Result Value Ref Range   Sodium 141 135 - 145 mEq/L   Potassium 4.9 3.5 - 5.1 mEq/L   Chloride 105 96 - 112 mEq/L   CO2 27 19 - 32 mEq/L   Glucose, Bld 95 70 - 99 mg/dL   BUN 32 (H) 6 - 23 mg/dL   Creatinine, Ser 1.28 (H) 0.40 - 1.20 mg/dL   Total Bilirubin 0.3 0.2 - 1.2 mg/dL   Alkaline Phosphatase 42 39 - 117 U/L   AST 17 0 - 37 U/L   ALT 7 0 - 35 U/L   Total Protein 7.2 6.0 - 8.3 g/dL   Albumin 4.6 3.5 - 5.2 g/dL   GFR 41.06 (L) >60.00 mL/min   Calcium 9.8 8.4 - 10.5 mg/dL  VITAMIN D 25 Hydroxy (Vit-D Deficiency, Fractures)  Result Value Ref Range   VITD 43.48 30.00 - 100.00 ng/mL      07/13/2022   10:30 AM 07/12/2021    9:50 AM 12/14/2020   12:14 PM 03/25/2020   10:11 AM 03/25/2019   11:01 AM  Depression screen PHQ 2/9  Decreased Interest 0 0 0 0 0  Down, Depressed, Hopeless 0 0 0 0 0  PHQ - 2 Score 0 0 0 0 0  Altered sleeping   0 0 0  Tired, decreased energy   1 0 1  Change in appetite   0 0 0  Feeling bad or failure about yourself    0 0 0  Trouble concentrating   0 0 0  Moving slowly or fidgety/restless   0 0 0  Suicidal thoughts   0 0 0  PHQ-9 Score   1 0 1  Difficult doing work/chores   Not difficult at all Not difficult at all        03/25/2019   11:01 AM  GAD 7 : Generalized Anxiety Score  Nervous, Anxious, on Edge 0  Control/stop worrying 0  Worry too much - different things 0  Trouble relaxing 0  Restless 0  Easily annoyed or irritable 0  Afraid - awful might happen 0  Total GAD 7 Score 0   Assessment & Plan:   Problem List Items Addressed This Visit     Advanced care planning/counseling discussion (Chronic)    Advanced directives: Husband then daughter are HCPOA. Has packet at home but needs to get notarized. Asked to bring me copy.       Encounter for general adult medical examination with abnormal findings - Primary (Chronic)    Preventative protocols reviewed and updated  unless pt declined. Discussed healthy diet and lifestyle.       Essential hypertension    Chronic, stable on current regimen - continue.       GERD (gastroesophageal reflux disease)    Worsening - increase ppi to BID, add pepcid nightly, update with effect.       Relevant Medications  pantoprazole (PROTONIX) 40 MG tablet   famotidine (PEPCID) 20 MG tablet   MDD (major depressive disorder), recurrent episode, moderate (HCC)    Chronic, deterioration noted by husband - increased anxiety. Continue sertraline '100mg'$  with klonopin nightly. Reassess at f/u visit.       Familial combined hyperlipidemia    Continue crestor MWF and repatha.       Prediabetes    Limit added sugar in diet.       Chronic kidney disease, stage 3b (HCC)    Labwork stable, PTH normal. Continue to monitor.       Anemia due to stage 3 chronic kidney disease (HCC)    Chronic, Hgb 10.7. will continue to monitor.       Bilateral carotid artery stenosis    Update carotid US.       Relevant Orders   VAS US CAROTID   Nonischemic cardiomyopathy ([place])   Persistent atrial fibrillation ([place])    Sees EP on eliquis, tikosyn.       Chronic insomnia    Continues klonopin.       Chronic HFrEF (heart failure with reduced ejection fraction) (HCC)    Nonischemic. Continue losartan.       History of repair of aneurysm of abdominal aorta using endovascular stent graft    Continue yearly VVS f/u       Memory deficit    RTC 3 mo for formal memory assessment. Consider TSH, B12 check.       RESOLVED: PULMONARY FIBROSIS, POSTINFLAMMATORY    Anticipate error in chart. Will remove.         Meds ordered this encounter  Medications   pantoprazole (PROTONIX) 40 MG tablet    Sig: Take 1 tablet (40 mg total) by mouth in the morning and at bedtime.    Dispense:  180 tablet    Refill:  3   famotidine (PEPCID) 20 MG tablet    Sig: Take 1 tablet (20 mg total) by mouth at bedtime.    Dispense:  90 tablet     Refill:  1   No orders of the defined types were placed in this encounter.   Patient instructions: For heartburn - limit caffeine, alcohol, anti inflammatories. Increase pantoprazole to '40mg'$  twice daily and/or start pepcid '20mg'$  nightly (over the counter) for 3 weeks and update [place] with effect.  You may be due for repeat colonoscopy 06/2023.  Bring [place] copy of your living will.  Return in 3 months for memory assessment  Follow up plan: Return in about 3 months (around 10/22/2022), or if symptoms worsen or fail to improve, for follow up visit.  Ria Bush, MD

## 2022-07-23 NOTE — Patient Instructions (Addendum)
For heartburn - limit caffeine, alcohol, anti inflammatories. Increase pantoprazole to '40mg'$  twice daily and/or start pepcid '20mg'$  nightly (over the counter) for 3 weeks and update [place] with effect.  You may be due for repeat colonoscopy 06/2023.  Bring [place] copy of your living will.  Return in 3 months for memory assessment  Health Maintenance After Age 76 After age 20, you are at a higher risk for certain long-term diseases and infections as well as injuries from falls. Falls are a major cause of broken bones and head injuries in people who are older than age 77. Getting regular preventive care can help to keep you healthy and well. Preventive care includes getting regular testing and making lifestyle changes as recommended by your health care provider. Talk with your health care provider about: Which screenings and tests you should have. A screening is a test that checks for a disease when you have no symptoms. A diet and exercise plan that is right for you. What should I know about screenings and tests to prevent falls? Screening and testing are the best ways to find a health problem early. Early diagnosis and treatment give you the best chance of managing medical conditions that are common after age 48. Certain conditions and lifestyle choices may make you more likely to have a fall. Your health care provider may recommend: Regular vision checks. Poor vision and conditions such as cataracts can make you more likely to have a fall. If you wear glasses, make sure to get your prescription updated if your vision changes. Medicine review. Work with your health care provider to regularly review all of the medicines you are taking, including over-the-counter medicines. Ask your health care provider about any side effects that may make you more likely to have a fall. Tell your health care provider if any medicines that you take make you feel dizzy or sleepy. Strength and balance checks. Your health care provider  may recommend certain tests to check your strength and balance while standing, walking, or changing positions. Foot health exam. Foot pain and numbness, as well as not wearing proper footwear, can make you more likely to have a fall. Screenings, including: Osteoporosis screening. Osteoporosis is a condition that causes the bones to get weaker and break more easily. Blood pressure screening. Blood pressure changes and medicines to control blood pressure can make you feel dizzy. Depression screening. You may be more likely to have a fall if you have a fear of falling, feel depressed, or feel unable to do activities that you used to do. Alcohol use screening. Using too much alcohol can affect your balance and may make you more likely to have a fall. Follow these instructions at home: Lifestyle Do not drink alcohol if: Your health care provider tells you not to drink. If you drink alcohol: Limit how much you have to: 0-1 drink a day for women. 0-2 drinks a day for men. Know how much alcohol is in your drink. In the [place] one drink equals one 12 oz bottle of beer (355 mL), one 5 oz glass of wine (148 mL), or one 1 oz glass of hard liquor (44 mL). Do not use any products that contain nicotine or tobacco. These products include cigarettes, chewing tobacco, and vaping devices, such as e-cigarettes. If you need help quitting, ask your health care provider. Activity  Follow a regular exercise program to stay fit. This will help you maintain your balance. Ask your health care provider what types of exercise are  appropriate for you. If you need a cane or walker, use it as recommended by your health care provider. Wear supportive shoes that have nonskid soles. Safety  Remove any tripping hazards, such as rugs, cords, and clutter. Install safety equipment such as grab bars in bathrooms and safety rails on stairs. Keep rooms and walkways well-lit. General instructions Talk with your health care  provider about your risks for falling. Tell your health care provider if: You fall. Be sure to tell your health care provider about all falls, even ones that seem minor. You feel dizzy, tiredness (fatigue), or off-balance. Take over-the-counter and prescription medicines only as told by your health care provider. These include supplements. Eat a healthy diet and maintain a healthy weight. A healthy diet includes low-fat dairy products, low-fat (lean) meats, and fiber from whole grains, beans, and lots of fruits and vegetables. Stay current with your vaccines. Schedule regular health, dental, and eye exams. Summary Having a healthy lifestyle and getting preventive care can help to protect your health and wellness after age 25. Screening and testing are the best way to find a health problem early and help you avoid having a fall. Early diagnosis and treatment give you the best chance for managing medical conditions that are more common for people who are older than age 93. Falls are a major cause of broken bones and head injuries in people who are older than age 28. Take precautions to prevent a fall at home. Work with your health care provider to learn what changes you can make to improve your health and wellness and to prevent falls. This information is not intended to replace advice given to you by your health care provider. Make sure you discuss any questions you have with your health care provider. Document Revised: 11/21/2020 Document Reviewed: 11/21/2020 Elsevier Patient Education  [name].

## 2022-07-23 NOTE — Assessment & Plan Note (Signed)
Advanced directives: Husband then daughter are HCPOA. Has packet at home but needs to get notarized. Asked to bring me copy.

## 2022-07-23 NOTE — Assessment & Plan Note (Signed)
Anticipate error in chart. Will remove.

## 2022-07-24 ENCOUNTER — Encounter: Payer: Self-pay | Admitting: Family Medicine

## 2022-07-24 DIAGNOSIS — R413 Other amnesia: Secondary | ICD-10-CM | POA: Insufficient documentation

## 2022-07-24 DIAGNOSIS — G3184 Mild cognitive impairment, so stated: Secondary | ICD-10-CM | POA: Insufficient documentation

## 2022-07-24 NOTE — Assessment & Plan Note (Signed)
Update carotid US.  

## 2022-07-24 NOTE — Assessment & Plan Note (Addendum)
Chronic, deterioration noted by husband - increased anxiety. Continue sertraline '100mg'$  with klonopin nightly. Reassess at f/u visit.

## 2022-07-24 NOTE — Assessment & Plan Note (Signed)
Chronic, stable on current regimen - continue. 

## 2022-07-24 NOTE — Assessment & Plan Note (Signed)
Continue yearly VVS f/u

## 2022-07-24 NOTE — Assessment & Plan Note (Signed)
Chronic, Hgb 10.7. will continue to monitor.

## 2022-07-24 NOTE — Assessment & Plan Note (Signed)
Sees EP on eliquis, tikosyn.

## 2022-07-24 NOTE — Assessment & Plan Note (Signed)
Limit added sugar in diet.

## 2022-07-24 NOTE — Assessment & Plan Note (Signed)
Labwork stable, PTH normal. Continue to monitor.

## 2022-07-24 NOTE — Assessment & Plan Note (Signed)
Continue crestor MWF and repatha.

## 2022-07-24 NOTE — Assessment & Plan Note (Signed)
Worsening - increase ppi to BID, add pepcid nightly, update with effect.

## 2022-07-24 NOTE — Assessment & Plan Note (Signed)
RTC 3 mo for formal memory assessment. Consider TSH, B12 check.

## 2022-07-24 NOTE — Assessment & Plan Note (Signed)
Continues klonopin.

## 2022-07-24 NOTE — Progress Notes (Unsigned)
    Ann Magar T. Ann Ayuso, MD, [place] at [facility] [place], [address]  Phone: [phone]  FAX: [place] - 77 y.o. female  MRN [id number]  Date of Birth: [birth year]  Date: 07/25/2022  PCP: Ria Bush, MD  Referral: Ria Bush, MD  No chief complaint on file.  Subjective:   Ann White is a 77 y.o. very pleasant female patient with There is no height or weight on file to calculate BMI. who presents with the following:  She is a pleasant 77 year old lady with known right tricompartment osteoarthritis, she is here today for ongoing right-sided knee osteoarthritis.    Review of Systems is noted in the HPI, as appropriate  Objective:   There were no vitals taken for this visit.  GEN: No acute distress; alert,appropriate. PULM: Breathing comfortably in no respiratory distress PSYCH: Normally interactive.   Laboratory and Imaging Data:  Assessment and Plan:   ***

## 2022-07-24 NOTE — Assessment & Plan Note (Signed)
Nonischemic. Continue losartan.

## 2022-07-25 ENCOUNTER — Ambulatory Visit (INDEPENDENT_AMBULATORY_CARE_PROVIDER_SITE_OTHER): Payer: Medicare Other | Admitting: Family Medicine

## 2022-07-25 VITALS — BP 120/60 | Temp 97.7°F | Ht 63.75 in | Wt 141.1 lb

## 2022-07-25 DIAGNOSIS — M5137 Other intervertebral disc degeneration, lumbosacral region: Secondary | ICD-10-CM

## 2022-07-25 DIAGNOSIS — M5416 Radiculopathy, lumbar region: Secondary | ICD-10-CM

## 2022-07-25 DIAGNOSIS — M1711 Unilateral primary osteoarthritis, right knee: Secondary | ICD-10-CM | POA: Diagnosis not present

## 2022-07-25 MED ORDER — TRIAMCINOLONE ACETONIDE 40 MG/ML IJ SUSP
40.0000 mg | Freq: Once | INTRAMUSCULAR | Status: AC
  Administered 2022-07-25: 40 mg via INTRA_ARTICULAR

## 2022-07-25 MED ORDER — PREDNISONE 20 MG PO TABS: ORAL_TABLET | ORAL | 0 refills | Status: DC

## 2022-07-26 ENCOUNTER — Encounter: Payer: Self-pay | Admitting: Family Medicine

## 2022-07-31 ENCOUNTER — Other Ambulatory Visit: Payer: Self-pay | Admitting: Family Medicine

## 2022-07-31 NOTE — Telephone Encounter (Signed)
Refill request Clonazepam Last refill 02/07/22 #60 Last office visit 07/25/22 Dr. Lorelei Pont

## 2022-07-31 NOTE — Telephone Encounter (Signed)
Refill request Gabapentin Last refill 12/29/21 #90/1 Last office visit 07/25/22 Dr. Lorelei Pont

## 2022-08-01 NOTE — Telephone Encounter (Signed)
ERx 

## 2022-08-02 ENCOUNTER — Other Ambulatory Visit: Payer: Self-pay | Admitting: Family Medicine

## 2022-08-03 NOTE — Telephone Encounter (Signed)
Duplicate request.  Refill already sent in.

## 2022-08-10 ENCOUNTER — Telehealth: Payer: Self-pay | Admitting: Family Medicine

## 2022-08-10 NOTE — Telephone Encounter (Signed)
Patient called and asked when was she having a brain scan done. Call back number [phone].

## 2022-08-13 NOTE — Telephone Encounter (Signed)
Attempted to contact pt.  No answer.  No vm.  Need to relay Dr. G's message.  

## 2022-08-13 NOTE — Telephone Encounter (Addendum)
Attempted to contact pt.  No answer.  No vm.  Need to relay Dr. G's message.  

## 2022-08-13 NOTE — Telephone Encounter (Signed)
She has an upcoming carotid ultrasound scan at [facility]. I asked her to return in 3 months for formal memory assessment.

## 2022-08-14 NOTE — Telephone Encounter (Signed)
Patient notified as instructed by telephone and verbalized understanding. Patient stated that she has the appointment scheduled tomorrow for the ultrasound. Patient has a follow-up appointment scheduled with Dr. Danise Mina 10/22/22.

## 2022-08-15 ENCOUNTER — Ambulatory Visit (HOSPITAL_COMMUNITY)
Admission: RE | Admit: 2022-08-15 | Discharge: 2022-08-15 | Disposition: A | Discharge status: Home / Self Care | Payer: Medicare Other | Source: Ambulatory Visit | Attending: Cardiology | Admitting: Cardiology

## 2022-08-15 DIAGNOSIS — I6523 Occlusion and stenosis of bilateral carotid arteries: Secondary | ICD-10-CM | POA: Insufficient documentation

## 2022-09-01 ENCOUNTER — Other Ambulatory Visit: Payer: Self-pay | Admitting: Internal Medicine

## 2022-09-01 DIAGNOSIS — I4819 Other persistent atrial fibrillation: Secondary | ICD-10-CM

## 2022-09-03 NOTE — Telephone Encounter (Signed)
Refill request

## 2022-09-03 NOTE — Telephone Encounter (Signed)
Eliquis 76m refill request received. Patient is 77years old, weight-64kg, Crea-1.28 on 07/18/22, Diagnosis-Afib, and last seen by Dr. ESaunders Revelon 05/24/22. Dose is appropriate based on dosing criteria. Will send in refill to requested pharmacy.

## 2022-10-22 ENCOUNTER — Ambulatory Visit: Payer: Medicare Other | Admitting: Family Medicine

## 2022-10-29 ENCOUNTER — Other Ambulatory Visit: Payer: Self-pay | Admitting: *Deleted

## 2022-10-29 ENCOUNTER — Ambulatory Visit (HOSPITAL_COMMUNITY)
Admission: RE | Admit: 2022-10-29 | Discharge: 2022-10-29 | Disposition: A | Discharge status: Home / Self Care | Payer: Medicare Other | Source: Ambulatory Visit | Attending: Surgery | Admitting: Surgery

## 2022-10-29 DIAGNOSIS — Z9889 Other specified postprocedural states: Secondary | ICD-10-CM | POA: Diagnosis present

## 2022-10-30 ENCOUNTER — Ambulatory Visit (INDEPENDENT_AMBULATORY_CARE_PROVIDER_SITE_OTHER): Payer: Medicare Other | Admitting: Family Medicine

## 2022-10-30 ENCOUNTER — Encounter: Payer: Self-pay | Admitting: Family Medicine

## 2022-10-30 VITALS — BP 124/66 | HR 52 | Temp 97.9°F | Ht 63.75 in | Wt 147.1 lb

## 2022-10-30 DIAGNOSIS — R413 Other amnesia: Secondary | ICD-10-CM | POA: Diagnosis not present

## 2022-10-30 DIAGNOSIS — F331 Major depressive disorder, recurrent, moderate: Secondary | ICD-10-CM | POA: Diagnosis not present

## 2022-10-30 DIAGNOSIS — N1832 Chronic kidney disease, stage 3b: Secondary | ICD-10-CM | POA: Diagnosis not present

## 2022-10-30 MED ORDER — MEMANTINE HCL 5 MG PO TABS: ORAL_TABLET | ORAL | 3 refills | Status: DC

## 2022-10-30 NOTE — Assessment & Plan Note (Signed)
Overall stable period on sertraline 100mg daily - continue 

## 2022-10-30 NOTE — Progress Notes (Signed)
Ph: [phone]       Fax: [phone]   Patient ID: Ann White, female    DOB: 11-Dec-1945, 77 y.o.   MRN: [id number]  This visit was conducted in person.  BP 124/66   Pulse (!) 52   Temp 97.9 F (36.6 C) (Temporal)   Ht 5' 3.75" (1.619 m)   Wt 147 lb 2 oz (66.7 kg)   SpO2 96%   BMI 25.45 kg/m    CC: 3 mo geriatric assessment  Subjective:   HPI: Ann White is a 77 y.o. female presenting on 10/30/2022 for Medical Management of Chronic Issues (Here for 3 mo geriatric assessment. Pt accompanied by husband, Fred. )   Known R knee OA s/p steroid injection 07/2022 with benefit ([place]).   No h/o stroke.  H/o PAD: AAA s/p endovascular repair 2023 Randie Heinz) with latest imaging showing evidence of endoleak - sees [facility] tomorrow.  Carotid US 07/2022: 1-39% BICA stenosis, normal vertebral and subclavian arteries  Continues repatha Q2 wks along with crestor  MWF. On tikosyn and eliquis for afib followed by EP [facility]) and cardiology (End).   MDD with increasing anxiety - longstanding on sertraline  daily with klonopin nightly.   Worsening memory noted over the past 9 months - frequently forgets what she went into a room to get. Forgets 3 items at grocery store. Doesn't get lost while driving.   No tremor, shaking, no trouble sleeping, loss of smell, no stiffness, rigidity or slowing of movement.   Geriatric Assessment: Activities of Daily Living:     Bathing- incontinence    Dressing- incontinence    Eating- incontinence     Toileting- incontinence    Transferring- incontinence    Continence- incontinence Overall Assessment: incontinence  Instrumental Activities of Daily Living:     Transportation- incontinence    Meal/Food Preparation- incontinence    Shopping Errands- incontinence    Housekeeping/Chores- incontinence    Money Management/Finances- incontinence    Medication Management- incontinence    Ability to Use Telephone- incontinence    Laundry-  incontinence Overall Assessment: incontinence  Mental Status Exam: 24/30 (value/max value) Clock Drawing Score: 2/4       Relevant past medical, surgical, family and social history reviewed and updated as indicated. Interim medical history since our last visit reviewed. Allergies and medications reviewed and updated. Outpatient Medications Prior to Visit  Medication Sig Dispense Refill   acetaminophen (TYLENOL) 500 MG tablet Take 500-1,000 mg by mouth every 6 (six) hours as needed (knee pain.).     apixaban (ELIQUIS) 5 MG TABS tablet Take 1 tablet by mouth twice daily 180 tablet 1   cetirizine (ZYRTEC) 10 MG tablet Take 10 mg by mouth daily as needed for allergies.     Cholecalciferol (VITAMIN D3) 1000 UNITS CAPS Take 1 capsule (1,000 Units total) by mouth daily.     clonazePAM (KLONOPIN) 1 MG tablet Take 1 tablet by mouth twice daily as needed 60 tablet 0   dofetilide (TIKOSYN) 125 MCG capsule TAKE ONE CAPSULE BY MOUTH TWICE A DAY 180 capsule 3   Evolocumab (REPATHA SURECLICK) 140 MG/ML SOAJ INJECT ONE SYRINGEFUL SUBCUTNAEOUSLY EVERY 14 DAYS 2 mL 10   famotidine (PEPCID) 20 MG tablet Take 1 tablet (20 mg total) by mouth at bedtime. 90 tablet 1   gabapentin (NEURONTIN) 300 MG capsule TAKE ONE CAPSULE EVERY MORNING AND 2 CAPSULES AT BEDTIME 270 capsule 2   losartan (COZAAR) 100 MG tablet Take 1 tablet (100 mg total)  by mouth daily. 90 tablet 3   Multiple Vitamin (MULTIVITAMIN WITH MINERALS) TABS tablet Take 1 tablet by mouth daily.     oxymetazoline (AFRIN) 0.05 % nasal spray Place 1 spray into both nostrils 2 (two) times daily as needed for congestion.     pantoprazole (PROTONIX) 40 MG tablet Take 1 tablet (40 mg total) by mouth in the morning and at bedtime. 180 tablet 3   predniSONE (DELTASONE) 20 MG tablet 2 tabs po daily for 5 days, then 1 tab po daily for 5 days 15 tablet 0   rosuvastatin (CRESTOR) 5 MG tablet TAKE 1 TABLET BY MOUTH THREE TIMES A WEEK 45 tablet 2   sertraline (ZOLOFT)  100 MG tablet Take 1 tablet by mouth once daily 90 tablet 3   traMADol (ULTRAM) 50 MG tablet TAKE 1 TABLET BY MOUTH TWICE DAILY AS NEEDED FOR MODERATE PAIN 20 tablet 0   vitamin B-12 (CYANOCOBALAMIN) 500 MCG tablet Take 500 mcg by mouth daily.      No facility-administered medications prior to visit.     Per HPI unless specifically indicated in ROS section below Review of Systems  Objective:  BP 124/66   Pulse (!) 52   Temp 97.9 F (36.6 C) (Temporal)   Ht 5' 3.75" (1.619 m)   Wt 147 lb 2 oz (66.7 kg)   SpO2 96%   BMI 25.45 kg/m   Wt Readings from Last 3 Encounters:  10/30/22 147 lb 2 oz (66.7 kg)  07/25/22 141 lb 2 oz (64 kg)  07/23/22 139 lb 8 oz (63.3 kg)      Physical Exam Vitals and nursing note reviewed.  Constitutional:      Appearance: Normal appearance. She is not ill-appearing.  Neurological:     Mental Status: She is alert.  Psychiatric:        Mood and Affect: Mood normal.        Behavior: Behavior normal.       Results for orders placed or performed in visit on 07/18/22  Parathyroid hormone, intact (no Ca)  Result Value Ref Range   PTH 52 16 - 77 pg/mL  Microalbumin / creatinine urine ratio  Result Value Ref Range   Microalb, Ur 3.2 (H) 0.0 - 1.9 mg/dL   Creatinine,U 09.8 mg/dL   Microalb Creat Ratio 3.5 0.0 - 30.0 mg/g  CBC with Differential/Platelet  Result Value Ref Range   WBC 3.6 (L) 4.0 - 10.5 K/uL   RBC 3.69 (L) 3.87 - 5.11 Mil/uL   Hemoglobin 10.7 (L) 12.0 - 15.0 g/dL   HCT 11.9 (L) 14.7 - 82.9 %   MCV 88.6 78.0 - 100.0 fl   MCHC 32.8 30.0 - 36.0 g/dL   RDW 56.2 13.0 - 86.5 %   Platelets 186.0 150.0 - 400.0 K/uL   Neutrophils Relative % 54.5 43.0 - 77.0 %   Lymphocytes Relative 34.3 12.0 - 46.0 %   Monocytes Relative 4.7 3.0 - 12.0 %   Eosinophils Relative 5.6 (H) 0.0 - 5.0 %   Basophils Relative 0.9 0.0 - 3.0 %   Neutro Abs 1.9 1.4 - 7.7 K/uL   Lymphs Abs 1.2 0.7 - 4.0 K/uL   Monocytes Absolute 0.2 0.1 - 1.0 K/uL   Eosinophils  Absolute 0.2 0.0 - 0.7 K/uL   Basophils Absolute 0.0 0.0 - 0.1 K/uL  Hemoglobin A1c  Result Value Ref Range   Hgb A1c MFr Bld 6.2 4.6 - 6.5 %  Comprehensive metabolic panel  Result Value Ref  Range   Sodium 141 135 - 145 mEq/L   Potassium 4.9 3.5 - 5.1 mEq/L   Chloride 105 96 - 112 mEq/L   CO2 27 19 - 32 mEq/L   Glucose, Bld 95 70 - 99 mg/dL   BUN 32 (H) 6 - 23 mg/dL   Creatinine, Ser 1.61 (H) 0.40 - 1.20 mg/dL   Total Bilirubin 0.3 0.2 - 1.2 mg/dL   Alkaline Phosphatase 42 39 - 117 U/L   AST 17 0 - 37 U/L   ALT 7 0 - 35 U/L   Total Protein 7.2 6.0 - 8.3 g/dL   Albumin 4.6 3.5 - 5.2 g/dL   GFR 09.60 (L) >45.40 mL/min   Calcium 9.8 8.4 - 10.5 mg/dL  VITAMIN D 25 Hydroxy (Vit-D Deficiency, Fractures)  Result Value Ref Range   VITD 43.48 30.00 - 100.00 ng/mL   Lab Results  Component Value Date   TSH 3.93 07/19/2021   Lab Results  Component Value Date   VITAMINB12 1,367 (H) 03/13/2018      10/30/2022    3:30 PM 07/13/2022   10:30 AM 07/12/2021    9:50 AM 12/14/2020   12:14 PM 03/25/2020   10:11 AM  Depression screen PHQ 2/9  Decreased Interest 1 0 0 0 0  Down, Depressed, Hopeless 1 0 0 0 0  PHQ - 2 Score 2 0 0 0 0  Altered sleeping 0   0 0  Tired, decreased energy 2   1 0  Change in appetite 0   0 0  Feeling bad or failure about yourself  1   0 0  Trouble concentrating 0   0 0  Moving slowly or fidgety/restless 0   0 0  Suicidal thoughts 0   0 0  PHQ-9 Score 5   1 0  Difficult doing work/chores Somewhat difficult   Not difficult at all Not difficult at all       10/30/2022    3:31 PM 03/25/2019   11:01 AM  GAD 7 : Generalized Anxiety Score  Nervous, Anxious, on Edge 1 0  Control/stop worrying 2 0  Worry too much - different things 1 0  Trouble relaxing 1 0  Restless 1 0  Easily annoyed or irritable 0 0  Afraid - awful might happen 2 0  Total GAD 7 Score 8 0  Anxiety Difficulty Somewhat difficult    Assessment & Plan:   Problem List Items Addressed This  Visit     MDD (major depressive disorder), recurrent episode, moderate    Overall stable period on sertraline  daily - continue.       Chronic kidney disease, stage 3b    Update renal panel, CBC.       Relevant Orders   CBC with Differential/Platelet   Renal function panel   Memory deficit - Primary    Mild impairment noted based on memory testing today (MMSE 24/30, CDT 2/4). Discussed with patient. No symptoms suggestive of PD. Reviewed 4 core lifestyle changes to support a healthy mind, as per instruction. Likely not good candidate for Aricept in h/o GERD and afib on Tikosyn. Will trial namenda  daily x 2 wks then increase to  BID. Check memory labs today. RTC 3-4 mo f/u visit.       Relevant Orders   TSH   Vitamin B12   RPR     Meds ordered this encounter  Medications   memantine (NAMENDA) 5 MG tablet    Sig: Take  1 tablet (5 mg total) by mouth daily for 14 days, THEN 1 tablet (5 mg total) 2 (two) times daily.    Dispense:  60 tablet    Refill:  3    Orders Placed This Encounter  Procedures   TSH   Vitamin B12   RPR   CBC with Differential/Platelet   Renal function panel    Patient Instructions  Labs today  Memory testing a bit impaired.  Trial namenda medicine sent to pharmacy. Start 5mg  daily for 2 weeks and if tolerating well increase to twice daily.  Return in 3-4 months for follow up memory  Reviewed 4 core lifestyle modifications to support a healthy mind: 1. Nutritious well balance diet.  2. Regular physical activity routine.  3. Regular mental activity such as reading books, word puzzles, math puzzles, jigsaw puzzles.  4. Social engagement.  Also ensure good blood pressure control, limit alcohol, no smoking.    Follow up plan: Return in about 4 months (around 03/01/2023), or if symptoms worsen or fail to improve, for follow up visit.  Eustaquio Boyden, MD

## 2022-10-30 NOTE — Assessment & Plan Note (Signed)
Mild impairment noted based on memory testing today (MMSE 24/30, CDT 2/4). Discussed with patient. No symptoms suggestive of PD. Reviewed 4 core lifestyle changes to support a healthy mind, as per instruction. Likely not good candidate for Aricept in h/o GERD and afib on Tikosyn. Will trial namenda 5mg  daily x 2 wks then increase to 5mg  BID. Check memory labs today. RTC 3-4 mo f/u visit.

## 2022-10-30 NOTE — Patient Instructions (Addendum)
Labs today  Memory testing a bit impaired.  Trial namenda medicine sent to pharmacy. Start 5mg  daily for 2 weeks and if tolerating well increase to twice daily.  Return in 3-4 months for follow up memory  Reviewed 4 core lifestyle modifications to support a healthy mind: 1. Nutritious well balance diet.  2. Regular physical activity routine.  3. Regular mental activity such as reading books, word puzzles, math puzzles, jigsaw puzzles.  4. Social engagement.  Also ensure good blood pressure control, limit alcohol, no smoking.

## 2022-10-30 NOTE — Assessment & Plan Note (Signed)
Update renal panel, CBC.

## 2022-10-31 ENCOUNTER — Other Ambulatory Visit: Payer: Self-pay | Admitting: Family Medicine

## 2022-10-31 ENCOUNTER — Ambulatory Visit (INDEPENDENT_AMBULATORY_CARE_PROVIDER_SITE_OTHER): Payer: Medicare Other | Admitting: Physician Assistant

## 2022-10-31 VITALS — BP 124/64 | HR 50 | Temp 97.8°F | Wt 140.0 lb

## 2022-10-31 DIAGNOSIS — K219 Gastro-esophageal reflux disease without esophagitis: Secondary | ICD-10-CM

## 2022-10-31 DIAGNOSIS — Z9889 Other specified postprocedural states: Secondary | ICD-10-CM

## 2022-10-31 LAB — CBC WITH DIFFERENTIAL/PLATELET
Basophils Absolute: 0 10*3/uL (ref 0.0–0.1)
Basophils Relative: 0.5 % (ref 0.0–3.0)
Eosinophils Absolute: 0.1 10*3/uL (ref 0.0–0.7)
Eosinophils Relative: 2.6 % (ref 0.0–5.0)
HCT: 29.7 % — ABNORMAL LOW (ref 36.0–46.0)
Hemoglobin: 10.2 g/dL — ABNORMAL LOW (ref 12.0–15.0)
Lymphocytes Relative: 25.4 % (ref 12.0–46.0)
Lymphs Abs: 1.3 10*3/uL (ref 0.7–4.0)
MCHC: 34.3 g/dL (ref 30.0–36.0)
MCV: 88.4 fl (ref 78.0–100.0)
Monocytes Absolute: 0.3 10*3/uL (ref 0.1–1.0)
Monocytes Relative: 6 % (ref 3.0–12.0)
Neutro Abs: 3.2 10*3/uL (ref 1.4–7.7)
Neutrophils Relative %: 65.5 % (ref 43.0–77.0)
Platelets: 169 10*3/uL (ref 150.0–400.0)
RBC: 3.36 Mil/uL — ABNORMAL LOW (ref 3.87–5.11)
RDW: 14.3 % (ref 11.5–15.5)
WBC: 5 10*3/uL (ref 4.0–10.5)

## 2022-10-31 LAB — RENAL FUNCTION PANEL
Albumin: 4.7 g/dL (ref 3.5–5.2)
BUN: 28 mg/dL — ABNORMAL HIGH (ref 6–23)
CO2: 26 mEq/L (ref 19–32)
Calcium: 9.1 mg/dL (ref 8.4–10.5)
Chloride: 105 mEq/L (ref 96–112)
Creatinine, Ser: 1.36 mg/dL — ABNORMAL HIGH (ref 0.40–1.20)
GFR: 38.1 mL/min — ABNORMAL LOW (ref >60.00)
Glucose, Bld: 124 mg/dL — ABNORMAL HIGH (ref 70–99)
Phosphorus: 4.6 mg/dL (ref 2.3–4.6)
Potassium: 4.3 mEq/L (ref 3.5–5.1)
Sodium: 142 mEq/L (ref 135–145)

## 2022-10-31 LAB — RPR: RPR Ser Ql: NONREACTIVE

## 2022-10-31 LAB — VITAMIN B12: Vitamin B-12: 386 pg/mL (ref 211–911)

## 2022-10-31 LAB — TSH: TSH: 2.77 u[IU]/mL (ref 0.35–5.50)

## 2022-10-31 NOTE — Progress Notes (Signed)
Office Note     CC:  follow up Requesting Provider:  Eustaquio Boyden, MD  HPI: Ann White is a 77 y.o. (Feb 24, 1946) female who presents for surveillance of endovascular repair of an abdominal aortic aneurysm.  This was performed by Dr. Randie Heinz in March 2023.  She was noted to have a type II endoleak when she was seen postoperatively by Dr. Randie Heinz however the sac size was unchanged.  She denies any new or changing abdominal or back pain.  She is ambulatory without claudication.  She is a former smoker.  She is on Eliquis for atrial fibrillation.   Past Medical History:  Diagnosis Date   Allergy    Anxiety    Arthritis    Carotid stenosis    a. 02/2016 Carotid U/S: Bilateral 40-59%; b. 03/2017 Carotid U/S: RICA 40-59%, LICA 1-39%; c. 04/2019 Carotid U/S: bilat 40-59% ICA dzs.   Cervical spondylosis    s/p spine injections   CKD (chronic kidney disease), stage III    Depression    DVT (deep venous thrombosis)    RLE DVT 05/27/15   Dysrhythmia    afib   Ectatic abdominal aorta 2016   rpt [place] 5 yrs   GERD (gastroesophageal reflux disease)    History of chicken pox    History of diverticulitis of colon    HLD (hyperlipidemia)    HTN (hypertension)    Irritable bowel syndrome with constipation    Lichen sclerosus et atrophicus    NICM (nonischemic cardiomyopathy)    a. 04/2017 Echo: EF 35-40%, diff HK; b. 05/2017 MV: EF 56%, no ischemia/infarct; c. 04/2018 Echo: EF 55-60%, no rwma, Gr2 DD; d. 03/2020 Echo: EF 40-45%, nl PASP, mod dil LA, mild to mod dil RA, Triv MR.   Obesity    Persistent atrial fibrillation    a. Dx 03/2020. CHA2DS2VASc = 5-->xarelto 15mg  daily.   Premature atrial contraction    a. 04/2017 48hr Holter: Predominant rhythm - sinus. Rare PVC's, freq PAC's with freq atrial runs up to 34 beats-->metoprolol started.   Seasonal allergic rhinitis     Past Surgical History:  Procedure Laterality Date   ABDOMINAL AORTIC ANEURYSM REPAIR  09/2021   Randie Heinz   ABDOMINAL  AORTIC ENDOVASCULAR STENT GRAFT  10/03/2021   Procedure: ABDOMINAL AORTIC ENDOVASCULAR STENT GRAFT;  Surgeon: Maeola Harman, MD;  Location: [facility] OR;  Service: Vascular;;   ABDOMINAL HYSTERECTOMY     bladder tack  1990s   BREAST BIOPSY Right 03/05/2012    benign   CARDIOVERSION N/A 05/17/2020   Procedure: CARDIOVERSION;  Surgeon: Yvonne Kendall, MD;  Location: [facility] ORS;  Service: Cardiovascular;  Laterality: N/A;   CARPAL TUNNEL RELEASE  2004, 2013   bilateral   CHOLECYSTECTOMY  1990s   COLONOSCOPY  2002   COLONOSCOPY  06/2013   mod diverticulosis, 3 tubular adenomas, int hem rpt 5 yrs Russella Dar)   COLONOSCOPY  06/2018   TA,c olonic angiodysplastic lesion, mod diverticulosis, rpt 5 yrs Russella Dar)   DEXA  07/2011   normal, T score -0.3   ESOPHAGOGASTRODUODENOSCOPY ENDOSCOPY  2004   normal ([place])   TOTAL ABDOMINAL HYSTERECTOMY W/ BILATERAL SALPINGOOPHORECTOMY  1990s   complete, dysmenorrhea and fibroids   ULTRASOUND GUIDANCE FOR VASCULAR ACCESS  10/03/2021   Procedure: ULTRASOUND GUIDANCE FOR VASCULAR ACCESS;  Surgeon: Maeola Harman, MD;  Location: [facility] OR;  Service: Vascular;;    Social History   Socioeconomic History   Marital status: Married    Spouse name: Rudell Cobb  Number of children: 2   Years of education: Not on file   Highest education level: Not on file  Occupational History   Occupation: retired   Tobacco Use   Smoking status: Former    Packs/day: 0.25    Years: 3.00    Additional pack years: 0.00    Total pack years: 0.75    Types: Cigarettes    Quit date: 1990    Years since quitting: 34.3   Smokeless tobacco: Never   Tobacco comments:    minimal smoking history  Vaping Use   Vaping Use: Never used  Substance and Sexual Activity   Alcohol use: No    Alcohol/week: 0.0 standard drinks of alcohol   Drug use: No   Sexual activity: Not Currently  Other Topics Concern   Not on file  Social History Narrative   Lives with husband and 2 cats.   2 grown children, 5 grandchildren.   Occupation: retired, worked in [employer]   Activity: no regular exercise.  Does walk in summer   Diet: fruits/vegetables daily, good water.   Social Determinants of Health   Financial Resource Strain: Low Risk  (07/13/2022)   Overall Financial Resource Strain (CARDIA)    Difficulty of Paying Living Expenses: Not very hard  Food Insecurity: No Food Insecurity (07/13/2022)   Hunger Vital Sign    Worried About Running Out of Food in the Last Year: Never true    Ran Out of Food in the Last Year: Never true  Transportation Needs: No Transportation Needs (07/13/2022)   PRAPARE - Administrator, Civil Service (Medical): No    Lack of Transportation (Non-Medical): No  Physical Activity: Inactive (07/12/2021)   Exercise Vital Sign    Days of Exercise per Week: 0 days    Minutes of Exercise per Session: 0 min  Stress: No Stress Concern Present (07/13/2022)   [employer] of Occupational Health - Occupational Stress Questionnaire    Feeling of Stress : Not at all  Social Connections: Moderately Integrated (07/13/2022)   Social Connection and Isolation Panel [NHANES]    Frequency of Communication with Friends and Family: More than three times a week    Frequency of Social Gatherings with Friends and Family: More than three times a week    Attends Religious Services: More than 4 times per year    Active Member of [employer] or Organizations: No    Attends Banker Meetings: Never    Marital Status: Married  Catering manager Violence: Not At Risk (07/13/2022)   Humiliation, Afraid, Rape, and Kick questionnaire    Fear of Current or Ex-Partner: No    Emotionally Abused: No    Physically Abused: No    Sexually Abused: No    Family History  Problem Relation Age of Onset   CAD Mother 30       MI   Hypertension Mother    Parkinson's disease Father    Diabetes Father    CAD Father 13       MI   Hyperlipidemia Sister    Cancer Neg Hx     Stroke Neg Hx    Colon cancer Neg Hx    Breast cancer Neg Hx    Colon polyps Neg Hx    Esophageal cancer Neg Hx    Stomach cancer Neg Hx    Rectal cancer Neg Hx     Current Outpatient Medications  Medication Sig Dispense Refill   acetaminophen (TYLENOL) 500 MG tablet  Take 500-1,000 mg by mouth every 6 (six) hours as needed (knee pain.).     apixaban (ELIQUIS) 5 MG TABS tablet Take 1 tablet by mouth twice daily 180 tablet 1   cetirizine (ZYRTEC) 10 MG tablet Take 10 mg by mouth daily as needed for allergies.     Cholecalciferol (VITAMIN D3) 1000 UNITS CAPS Take 1 capsule (1,000 Units total) by mouth daily.     clonazePAM (KLONOPIN) 1 MG tablet Take 1 tablet by mouth twice daily as needed 60 tablet 0   dofetilide (TIKOSYN) 125 MCG capsule TAKE ONE CAPSULE BY MOUTH TWICE A DAY 180 capsule 3   Evolocumab (REPATHA SURECLICK) 140 MG/ML SOAJ INJECT ONE SYRINGEFUL SUBCUTNAEOUSLY EVERY 14 DAYS 2 mL 10   famotidine (PEPCID) 20 MG tablet Take 1 tablet (20 mg total) by mouth at bedtime. 90 tablet 1   gabapentin (NEURONTIN) 300 MG capsule TAKE ONE CAPSULE EVERY MORNING AND 2 CAPSULES AT BEDTIME 270 capsule 2   losartan (COZAAR) 100 MG tablet Take 1 tablet (100 mg total) by mouth daily. 90 tablet 3   memantine (NAMENDA) 5 MG tablet Take 1 tablet (5 mg total) by mouth daily for 14 days, THEN 1 tablet (5 mg total) 2 (two) times daily. 60 tablet 3   Multiple Vitamin (MULTIVITAMIN WITH MINERALS) TABS tablet Take 1 tablet by mouth daily.     oxymetazoline (AFRIN) 0.05 % nasal spray Place 1 spray into both nostrils 2 (two) times daily as needed for congestion.     pantoprazole (PROTONIX) 40 MG tablet Take 1 tablet (40 mg total) by mouth 2 (two) times daily. 180 tablet 3   predniSONE (DELTASONE) 20 MG tablet 2 tabs po daily for 5 days, then 1 tab po daily for 5 days 15 tablet 0   rosuvastatin (CRESTOR) 5 MG tablet TAKE 1 TABLET BY MOUTH THREE TIMES A WEEK 45 tablet 2   sertraline (ZOLOFT) 100 MG tablet Take  1 tablet by mouth once daily 90 tablet 3   traMADol (ULTRAM) 50 MG tablet TAKE 1 TABLET BY MOUTH TWICE DAILY AS NEEDED FOR MODERATE PAIN 20 tablet 0   vitamin B-12 (CYANOCOBALAMIN) 500 MCG tablet Take 500 mcg by mouth daily.      No current facility-administered medications for this visit.    Allergies  Allergen Reactions   Sulfa Antibiotics Swelling    Oral swelling   Nexletol [Bempedoic Acid]     Dizziness, "eyes felt funny"   Tricor [Fenofibrate] Other (See Comments)    myalgias   Zetia [Ezetimibe] Other (See Comments)    Dizziness   Contrast Media [Iodinated Contrast Media] Other (See Comments)    Oral contrast caused mouth blisters   Lipitor [Atorvastatin] Other (See Comments)    myalgias   Pravastatin Other (See Comments)    myalgias     REVIEW OF SYSTEMS:    denotes positive finding,  denotes negative finding Cardiac  Comments:  Chest pain or chest pressure:    Shortness of breath upon exertion:    Short of breath when lying flat:    Irregular heart rhythm:        Vascular    Pain in calf, thigh, or hip brought on by ambulation:    Pain in feet at night that wakes you up from your sleep:     Blood clot in your veins:    Leg swelling:         Pulmonary    Oxygen at home:    Productive  cough:     Wheezing:         Neurologic    Sudden weakness in arms or legs:     Sudden numbness in arms or legs:     Sudden onset of difficulty speaking or slurred speech:    Temporary loss of vision in one eye:     Problems with dizziness:         Gastrointestinal    Blood in stool:     Vomited blood:         Genitourinary    Burning when urinating:     Blood in urine:        Psychiatric    Major depression:         Hematologic    Bleeding problems:    Problems with blood clotting too easily:        Skin    Rashes or ulcers:        Constitutional    Fever or chills:      PHYSICAL EXAMINATION:  Vitals:   10/31/22 1301  BP: 124/64  Pulse: (!) 50   Temp: 97.8 F (36.6 C)  TempSrc: Temporal  SpO2: 97%  Weight: 140 lb (63.5 kg)    General:  WDWN in NAD; vital signs documented above Gait: Not observed HENT: WNL, normocephalic Pulmonary: normal non-labored breathing , without Rales, rhonchi,  wheezing Cardiac: regular HR Abdomen: soft, NT, no masses Skin: without rashes Extremities: without ischemic changes, without Gangrene , without cellulitis; without open wounds;  Musculoskeletal: no muscle wasting or atrophy  Neurologic: A&O X 3 Psychiatric:  The pt has Normal affect.   Non-Invasive Vascular Imaging:   4.88cm AAA at largest diameter  Type Ib endoleak noted    ASSESSMENT/PLAN:: 77 y.o. female here for follow up for surveillance of endovascular repair of abdominal aortic aneurysm  -Duplex demonstrates AAA sac to be 4.88 cm which is unchanged from last office visit.  Despite a stable appearing AAA sac, there is concern for type Ib endoleak.  Plan will be to obtain a CTA abdomen and pelvis in 6 months and to go over the results with Dr. Randie Heinz.   Emilie Rutter, PA-C [facility] [phone]  Clinic MD:   Randie Heinz

## 2022-11-16 ENCOUNTER — Other Ambulatory Visit: Payer: Self-pay | Admitting: Family Medicine

## 2022-11-16 DIAGNOSIS — Z1231 Encounter for screening mammogram for malignant neoplasm of breast: Secondary | ICD-10-CM

## 2022-11-26 ENCOUNTER — Other Ambulatory Visit: Payer: Self-pay | Admitting: Family Medicine

## 2022-11-26 NOTE — Telephone Encounter (Signed)
ERx 

## 2022-11-26 NOTE — Telephone Encounter (Signed)
Name of Medication: Clonazepam Name of Pharmacy: [facility] Last Fill or Written Date and Quantity:08/01/22 #60 Last Office Visit and Type: 10/30/22 f/u  Next Office Visit and Type:03/01/23 f/u 3 mo Last Controlled Substance Agreement Date: 03/16/15 Last UDS: 03/16/15

## 2022-12-17 ENCOUNTER — Other Ambulatory Visit: Payer: Self-pay | Admitting: Internal Medicine

## 2022-12-25 NOTE — Progress Notes (Unsigned)
    Darrelle Wiberg T. Rilea Arutyunyan, MD, CAQ Sports Medicine [facility] [address] [place], [address]  Phone: [phone]  FAX: [phone]  Ann White - 77 y.o. female  MRN [id number]  Date of Birth: Nov 21, 1945  Date: 12/26/2022  PCP: Eustaquio Boyden, MD  Referral: Eustaquio Boyden, MD  No chief complaint on file.  Subjective:   Ann White is a 77 y.o. very pleasant female patient with There is no height or weight on file to calculate BMI. who presents with the following:  She is a pleasant patient with known osteoarthritis of the right knee.  I last saw her in January 2024, at that point I did an intra-articular injection of corticosteroid.    Review of Systems is noted in the HPI, as appropriate  Objective:   There were no vitals taken for this visit.  GEN: No acute distress; alert,appropriate. PULM: Breathing comfortably in no respiratory distress PSYCH: Normally interactive.   Laboratory and Imaging Data:  Assessment and Plan:   ***

## 2022-12-26 ENCOUNTER — Ambulatory Visit (INDEPENDENT_AMBULATORY_CARE_PROVIDER_SITE_OTHER): Payer: Medicare Other | Admitting: Family Medicine

## 2022-12-26 ENCOUNTER — Encounter: Payer: Self-pay | Admitting: Family Medicine

## 2022-12-26 VITALS — BP 112/70 | HR 55 | Temp 97.9°F | Ht 63.75 in | Wt 149.0 lb

## 2022-12-26 DIAGNOSIS — M1711 Unilateral primary osteoarthritis, right knee: Secondary | ICD-10-CM | POA: Diagnosis not present

## 2022-12-26 MED ORDER — TRAMADOL HCL 50 MG PO TABS: ORAL_TABLET | ORAL | 2 refills | Status: AC

## 2022-12-26 MED ORDER — TRIAMCINOLONE ACETONIDE 40 MG/ML IJ SUSP
40.0000 mg | Freq: Once | INTRAMUSCULAR | Status: AC
  Administered 2022-12-26: 40 mg via INTRA_ARTICULAR

## 2022-12-26 NOTE — Patient Instructions (Signed)
Voltaren 1% gel, over the counter ?You can apply up to 4 times a day ? ?This can be applied to any joint: knee, wrist, fingers, elbows, shoulders, feet and ankles. ?Can apply to any tendon: tennis elbow, achilles, tendon, rotator cuff or any other tendon. ? ?Minimal is absorbed in the bloodstream: ok with oral anti-inflammatory or a blood thinner. ? ?Cost is about 9 dollars  ?

## 2022-12-27 ENCOUNTER — Other Ambulatory Visit (HOSPITAL_COMMUNITY): Payer: Self-pay

## 2022-12-27 ENCOUNTER — Telehealth: Payer: Self-pay

## 2022-12-27 NOTE — Telephone Encounter (Signed)
Patient Advocate Encounter   Received notification from [employer] that prior authorization is required for traMADol HCl 50MG  tablets   Submitted: 12-27-2022 Key [id number]  Status is pending

## 2022-12-29 NOTE — Telephone Encounter (Signed)
Pharmacy Patient Advocate Encounter  Received notification from [facility] that Prior Authorization for Tramadol has been APPROVED from 12/27/2022 to 06/28/2023.Ann Kitchen  PA #/Case ID/Reference #: [id number]

## 2023-01-07 ENCOUNTER — Other Ambulatory Visit: Payer: Self-pay | Admitting: Pharmacist

## 2023-01-07 ENCOUNTER — Ambulatory Visit
Admission: RE | Admit: 2023-01-07 | Discharge: 2023-01-07 | Disposition: A | Discharge status: Home / Self Care | Payer: Medicare Other | Source: Ambulatory Visit | Attending: Family Medicine | Admitting: Family Medicine

## 2023-01-07 DIAGNOSIS — Z1231 Encounter for screening mammogram for malignant neoplasm of breast: Secondary | ICD-10-CM

## 2023-01-09 ENCOUNTER — Other Ambulatory Visit (HOSPITAL_COMMUNITY): Payer: Self-pay

## 2023-01-09 ENCOUNTER — Telehealth: Payer: Self-pay

## 2023-01-09 NOTE — Telephone Encounter (Signed)
Pharmacy Patient Advocate Encounter   Received notification from [facility] that prior authorization for REPATHA 140 MG/ML INJ is needed.    PA submitted on 01/09/23 Key [id number] Status is pending  Haze Rushing, CPhT Pharmacy Patient Advocate Specialist Direct Number: [phone] Fax: [phone]

## 2023-01-09 NOTE — Telephone Encounter (Signed)
Pharmacy Patient Advocate Encounter  Prior Authorization for REPATHA has been approved.    Effective dates: 01/09/23 through 01/09/24  Haze Rushing, CPhT Pharmacy Patient Advocate Specialist Direct Number: [phone] Fax: [phone]

## 2023-01-18 ENCOUNTER — Other Ambulatory Visit: Payer: Self-pay | Admitting: Pharmacist Clinician (PhC)/ Clinical Pharmacy Specialist

## 2023-01-18 ENCOUNTER — Other Ambulatory Visit: Payer: Self-pay | Admitting: Family Medicine

## 2023-01-18 DIAGNOSIS — K219 Gastro-esophageal reflux disease without esophagitis: Secondary | ICD-10-CM

## 2023-01-18 MED ORDER — REPATHA SURECLICK 140 MG/ML ~~LOC~~ SOAJ: 140.0000 mg | SUBCUTANEOUS | 3 refills | Status: AC

## 2023-02-05 ENCOUNTER — Ambulatory Visit: Payer: Medicare Other | Admitting: Family Medicine

## 2023-02-05 ENCOUNTER — Encounter: Payer: Self-pay | Admitting: Family Medicine

## 2023-02-05 VITALS — BP 130/84 | HR 41 | Temp 97.3°F | Ht 63.75 in | Wt 147.2 lb

## 2023-02-05 DIAGNOSIS — R001 Bradycardia, unspecified: Secondary | ICD-10-CM

## 2023-02-05 DIAGNOSIS — M2550 Pain in unspecified joint: Secondary | ICD-10-CM | POA: Diagnosis not present

## 2023-02-05 DIAGNOSIS — R413 Other amnesia: Secondary | ICD-10-CM | POA: Diagnosis not present

## 2023-02-05 LAB — CBC WITH DIFFERENTIAL/PLATELET
Basophils Absolute: 0 10*3/uL (ref 0.0–0.1)
Basophils Relative: 0.8 % (ref 0.0–3.0)
Eosinophils Absolute: 0.1 10*3/uL (ref 0.0–0.7)
Eosinophils Relative: 3.9 % (ref 0.0–5.0)
HCT: 32.7 % — ABNORMAL LOW (ref 36.0–46.0)
Hemoglobin: 10.5 g/dL — ABNORMAL LOW (ref 12.0–15.0)
Lymphocytes Relative: 32 % (ref 12.0–46.0)
Lymphs Abs: 1.2 10*3/uL (ref 0.7–4.0)
MCHC: 32 g/dL (ref 30.0–36.0)
MCV: 90.2 fl (ref 78.0–100.0)
Monocytes Absolute: 0.2 10*3/uL (ref 0.1–1.0)
Monocytes Relative: 5.5 % (ref 3.0–12.0)
Neutro Abs: 2.2 10*3/uL (ref 1.4–7.7)
Neutrophils Relative %: 57.8 % (ref 43.0–77.0)
Platelets: 161 10*3/uL (ref 150.0–400.0)
RBC: 3.62 Mil/uL — ABNORMAL LOW (ref 3.87–5.11)
RDW: 15 % (ref 11.5–15.5)
WBC: 3.8 10*3/uL — ABNORMAL LOW (ref 4.0–10.5)

## 2023-02-05 LAB — RENAL FUNCTION PANEL
Albumin: 4.5 g/dL (ref 3.5–5.2)
BUN: 33 mg/dL — ABNORMAL HIGH (ref 6–23)
CO2: 28 mEq/L (ref 19–32)
Calcium: 9.3 mg/dL (ref 8.4–10.5)
Chloride: 106 mEq/L (ref 96–112)
Creatinine, Ser: 1.32 mg/dL — ABNORMAL HIGH (ref 0.40–1.20)
GFR: 39.42 mL/min — ABNORMAL LOW (ref >60.00)
Glucose, Bld: 94 mg/dL (ref 70–99)
Phosphorus: 4.2 mg/dL (ref 2.3–4.6)
Potassium: 4.7 mEq/L (ref 3.5–5.1)
Sodium: 140 mEq/L (ref 135–145)

## 2023-02-05 LAB — C-REACTIVE PROTEIN: CRP: <1 mg/dL (ref 0.5–20.0)

## 2023-02-05 LAB — SEDIMENTATION RATE: Sed Rate: 27 mm/hr (ref 0–30)

## 2023-02-05 MED ORDER — PREDNISONE 20 MG PO TABS: ORAL_TABLET | ORAL | 0 refills | Status: DC

## 2023-02-05 NOTE — Patient Instructions (Addendum)
Stay off namenda. We will refer you to neurology for further evaluation.  I think you have flare of arthritis pain as well as possible bursitis to left hip.  Do exercises provided. Take prednisone taper for a week Rest joints May use tylenol followed by tramadol as needed for pain as well.  Let us know if not better with this treatment. Labs today

## 2023-02-05 NOTE — Progress Notes (Unsigned)
Ph: [phone] Fax: [phone]   Patient ID: Ann White, female    DOB: 05-Jun-1946, 77 y.o.   MRN: [id number]  This visit was conducted in person.  BP 130/84   Pulse (!) 41   Temp (!) 97.3 F (36.3 C) (Temporal)   Ht 5' 3.75" (1.619 m)   Wt 147 lb 4 oz (66.8 kg)   SpO2 97%   BMI 25.47 kg/m   Pulse Readings from Last 3 Encounters:  02/05/23 (!) 41  12/26/22 (!) 55  10/31/22 (!) 50   CC: bilat lower leg pain Subjective:   HPI: Ann White is a 77 y.o. female presenting on 02/05/2023 for Leg Pain (C/o B leg pain. Started 3-4 wks ago. )   3-4 wk h/o bilateral arm and leg pain as well as lower back. Points to bilateral lateral hips. Notes hip pain at R groin as well. Predominant pain to lower back into hips.  She has been more active due to husband's limitations ie lifting, mowing lawn.  Denies inciting trauma/injury or falls.  No fevers/chills, numbness or weakness of legs, bowel/bladder incontinence, saddle anesthesia.  No headache or shoulder pain or vision changes.   Known afib - on eliquis and tikosyn  She just had R knee steroid injection for osteoarthritis - with significant benefit ([place] 12/2022). Was also prescribed tramadol 50mg  #30, RF 2. This is helpful as well but causes constipation.   Memory deficits - she stopped namenda as this caused "legs jumping" and nervousness. Ongoing difficulty with memory. Likely not good candidate for Aricept in h/o GERD and afib on Tikosyn. They would be open to neurology evaluation in [place].   Bradycardia noted- denies dizziness, chest pain or dyspnea     Relevant past medical, surgical, family and social history reviewed and updated as indicated. Interim medical history since our last visit reviewed. Allergies and medications reviewed and updated. Outpatient Medications Prior to Visit  Medication Sig Dispense Refill   acetaminophen (TYLENOL) 500 MG tablet Take 500-1,000 mg by mouth every 6 (six) hours  as needed (knee pain.).     apixaban (ELIQUIS) 5 MG TABS tablet Take 1 tablet by mouth twice daily 180 tablet 1   cetirizine (ZYRTEC) 10 MG tablet Take 10 mg by mouth daily as needed for allergies.     Cholecalciferol (VITAMIN D3) 1000 UNITS CAPS Take 1 capsule (1,000 Units total) by mouth daily.     clonazePAM (KLONOPIN) 1 MG tablet Take 1 tablet by mouth twice daily as needed 60 tablet 1   dofetilide (TIKOSYN) 125 MCG capsule TAKE ONE CAPSULE BY MOUTH TWICE A DAY 180 capsule 3   Evolocumab (REPATHA SURECLICK) 140 MG/ML SOAJ Inject 140 mg into the skin every 14 (fourteen) days. 6 mL 3   famotidine (PEPCID) 20 MG tablet TAKE 1 TABLET BY MOUTH AT BEDTIME 90 tablet 1   gabapentin (NEURONTIN) 300 MG capsule TAKE ONE CAPSULE EVERY MORNING AND 2 CAPSULES AT BEDTIME 270 capsule 2   losartan (COZAAR) 100 MG tablet Take 1 tablet by mouth once daily 90 tablet 0   Multiple Vitamin (MULTIVITAMIN WITH MINERALS) TABS tablet Take 1 tablet by mouth daily.     oxymetazoline (AFRIN) 0.05 % nasal spray Place 1 spray into both nostrils 2 (two) times daily as needed for congestion.     pantoprazole (PROTONIX) 40 MG tablet Take 1 tablet (40 mg total) by mouth 2 (two) times daily. 180 tablet 3   rosuvastatin (CRESTOR) 5 MG tablet TAKE 1  TABLET BY MOUTH THREE TIMES A WEEK 45 tablet 2   sertraline (ZOLOFT) 100 MG tablet Take 1 tablet by mouth once daily 90 tablet 3   traMADol (ULTRAM) 50 MG tablet TAKE 1 TABLET BY MOUTH TWICE DAILY AS NEEDED FOR MODERATE PAIN 30 tablet 2   vitamin B-12 (CYANOCOBALAMIN) 500 MCG tablet Take 500 mcg by mouth daily.      memantine (NAMENDA) 5 MG tablet Take 1 tablet (5 mg total) by mouth daily for 14 days, THEN 1 tablet (5 mg total) 2 (two) times daily. 60 tablet 3   No facility-administered medications prior to visit.     Per HPI unless specifically indicated in ROS section below Review of Systems  Objective:  BP 130/84   Pulse (!) 41   Temp (!) 97.3 F (36.3 C) (Temporal)   Ht  5' 3.75" (1.619 m)   Wt 147 lb 4 oz (66.8 kg)   SpO2 97%   BMI 25.47 kg/m   Wt Readings from Last 3 Encounters:  02/05/23 147 lb 4 oz (66.8 kg)  12/26/22 149 lb (67.6 kg)  10/31/22 140 lb (63.5 kg)      Physical Exam Vitals and nursing note reviewed.  Constitutional:      Appearance: Normal appearance. She is not ill-appearing.  Musculoskeletal:        General: Tenderness present. Normal range of motion.     Right lower leg: No edema.     Left lower leg: No edema.     Comments:  No significant pain midline spine No paraspinous mm tenderness Neg SLR bilaterally. No pain with int/ext rotation at hip. No pain at SIJ, or sciatic notch bilaterally.  Discomfort to L trochanteric bursa  Skin:    General: Skin is warm and dry.     Findings: Bruising (to forearms due to eliquis) present. No rash.  Neurological:     General: No focal deficit present.     Mental Status: She is alert.     Comments:  5/5 strength BLE Diminished DTRs BLE  Psychiatric:        Mood and Affect: Mood normal.        Behavior: Behavior normal.       Results for orders placed or performed in visit on 02/05/23  Sedimentation rate  Result Value Ref Range   Sed Rate 27 0 - 30 mm/hr  C-reactive protein  Result Value Ref Range   CRP <1.0 0.5 - 20.0 mg/dL  Renal function panel  Result Value Ref Range   Sodium 140 135 - 145 mEq/L   Potassium 4.7 3.5 - 5.1 mEq/L   Chloride 106 96 - 112 mEq/L   CO2 28 19 - 32 mEq/L   Albumin 4.5 3.5 - 5.2 g/dL   BUN 33 (H) 6 - 23 mg/dL   Creatinine, Ser 1.61 (H) 0.40 - 1.20 mg/dL   Glucose, Bld 94 70 - 99 mg/dL   Phosphorus 4.2 2.3 - 4.6 mg/dL   GFR 09.60 (L) >45.40 mL/min   Calcium 9.3 8.4 - 10.5 mg/dL  CBC with Differential/Platelet  Result Value Ref Range   WBC 3.8 (L) 4.0 - 10.5 K/uL   RBC 3.62 (L) 3.87 - 5.11 Mil/uL   Hemoglobin 10.5 (L) 12.0 - 15.0 g/dL   HCT 98.1 (L) 19.1 - 47.8 %   MCV 90.2 78.0 - 100.0 fl   MCHC 32.0 30.0 - 36.0 g/dL   RDW 29.5 62.1 -  30.8 %   Platelets 161.0 150.0 -  400.0 K/uL   Neutrophils Relative % 57.8 43.0 - 77.0 %   Lymphocytes Relative 32.0 12.0 - 46.0 %   Monocytes Relative 5.5 3.0 - 12.0 %   Eosinophils Relative 3.9 0.0 - 5.0 %   Basophils Relative 0.8 0.0 - 3.0 %   Neutro Abs 2.2 1.4 - 7.7 K/uL   Lymphs Abs 1.2 0.7 - 4.0 K/uL   Monocytes Absolute 0.2 0.1 - 1.0 K/uL   Eosinophils Absolute 0.1 0.0 - 0.7 K/uL   Basophils Absolute 0.0 0.0 - 0.1 K/uL    Assessment & Plan:   Problem List Items Addressed This Visit     Sinus bradycardia    Remains bradycardic but asymptomatic on Tikosyn.       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 Avoiding donepezil in cardiac and GI history. Last visit we started namenda 5mg  daily - but this worsened restlessness/anxiety and caused involuntary leg movements. She's now off of this.  Will refer to neurology for evaluation/recs on possible medications.       Relevant Orders   Ambulatory referral to Neurology   Polyarthralgia - Primary    Anticipate exacerbation of osteoarthritis in setting of needing to be more physical active due to husband's limitations. She recently received R knee steroid injection with benefit.  She also describes R trochanteric bursitis - handout provided on exercises for this.  Will check labs for further evaluation - inflammatory markers.  Will Rx short prednisone taper for symptomatic relief, update with effect.  Discussed tylenol/tramadol use.       Relevant Orders   Sedimentation rate (Completed)   C-reactive protein (Completed)   Renal function panel (Completed)   CBC with Differential/Platelet (Completed)     Meds ordered this encounter  Medications   predniSONE (DELTASONE) 20 MG tablet    Sig: Take two tablets daily for 3 days followed by one tablet daily for 4 days    Dispense:  10 tablet    Refill:  0    Orders Placed This Encounter   Procedures   Sedimentation rate   C-reactive protein   Renal function panel   CBC with Differential/Platelet   Ambulatory referral to Neurology    Referral Priority:   Routine    Referral Type:   Consultation    Referral Reason:   Specialty Services Required    Requested Specialty:   Neurology    Number of Visits Requested:   1    Patient Instructions  Stay off namenda. We will refer you to neurology for further evaluation.  I think you have flare of arthritis pain as well as possible bursitis to left hip.  Do exercises provided. Take prednisone taper for a week Rest joints May use tylenol followed by tramadol as needed for pain as well.  Let us know if not better with this treatment. Labs today  Follow up plan: Return if symptoms worsen or fail to improve.  Eustaquio Boyden, MD

## 2023-02-06 DIAGNOSIS — M2550 Pain in unspecified joint: Secondary | ICD-10-CM | POA: Insufficient documentation

## 2023-02-06 NOTE — Assessment & Plan Note (Addendum)
Anticipate exacerbation of osteoarthritis in setting of needing to be more physical active due to husband's limitations. She recently received R knee steroid injection with benefit.  She also describes R trochanteric bursitis - handout provided on exercises for this.  Will check labs for further evaluation - inflammatory markers.  Will Rx short prednisone taper for symptomatic relief, update with effect.  Discussed tylenol/tramadol use.

## 2023-02-06 NOTE — Assessment & Plan Note (Signed)
Remains bradycardic but asymptomatic on Tikosyn.

## 2023-02-06 NOTE — Assessment & Plan Note (Addendum)
Previous testing 10/2022 with MMSE 24/30, CDT 2/4, evaluation suspicious for MCI with amnestic memory loss. Avoiding donepezil in cardiac and GI history. Last visit we started namenda 5mg  daily - but this worsened restlessness/anxiety and caused involuntary leg movements. She's now off of this.  Will refer to neurology for evaluation/recs on possible medications.

## 2023-03-01 ENCOUNTER — Ambulatory Visit: Payer: Medicare Other | Admitting: Family Medicine

## 2023-03-01 ENCOUNTER — Encounter: Payer: Self-pay | Admitting: Family Medicine

## 2023-03-01 VITALS — BP 122/68 | HR 51 | Temp 97.4°F | Ht 63.75 in | Wt 150.1 lb

## 2023-03-01 DIAGNOSIS — M79604 Pain in right leg: Secondary | ICD-10-CM | POA: Diagnosis not present

## 2023-03-01 DIAGNOSIS — E7849 Other hyperlipidemia: Secondary | ICD-10-CM

## 2023-03-01 DIAGNOSIS — G3184 Mild cognitive impairment, so stated: Secondary | ICD-10-CM

## 2023-03-01 DIAGNOSIS — R300 Dysuria: Secondary | ICD-10-CM | POA: Diagnosis not present

## 2023-03-01 DIAGNOSIS — I739 Peripheral vascular disease, unspecified: Secondary | ICD-10-CM | POA: Diagnosis not present

## 2023-03-01 LAB — POC URINALSYSI DIPSTICK (AUTOMATED)
Bilirubin, UA: NEGATIVE
Blood, UA: NEGATIVE
Glucose, UA: NEGATIVE
Ketones, UA: NEGATIVE
Nitrite, UA: NEGATIVE
Protein, UA: NEGATIVE
Spec Grav, UA: 1.02 (ref 1.010–1.025)
Urobilinogen, UA: 0.2 E.U./dL
pH, UA: 5.5 (ref 5.0–8.0)

## 2023-03-01 LAB — CK: Total CK: 74 U/L (ref 7–177)

## 2023-03-01 NOTE — Progress Notes (Unsigned)
Ph: [phone] Fax: [phone]   Patient ID: Ann White, female    DOB: [birth year], 77 y.o.   MRN: [id number]  This visit was conducted in person.  BP 122/68   Pulse (!) 51   Temp (!) 97.4 F (36.3 C) (Temporal)   Ht 5' 3.75" (1.619 m)   Wt 150 lb 2 oz (68.1 kg)   SpO2 94%   BMI 25.97 kg/m    CC: 3-4 mo f/u visit  Subjective:   HPI: Ann White is a 77 y.o. female presenting on 03/01/2023 for Medical Management of Chronic Issues (Here for 34- mo memory f/u. Pt accompanied by husband, Ann White. )   Diagnosed with MCI with amnestic memory loss 10/2022 with MMSE 24/30, CDT 2/4, started on namenda 5mg  BID - subsequently stopped as it worsened nervousness and "legs jumping".  Likely not good candidate for Aricept in h/o GERD and afib on Tikosyn.  Last month referred to neurology for further evaluation - appt scheduled for 05/14/2023 with Dr Frances Furbish at [facility].   No h/o stroke. No tremor, shaking, no trouble sleeping or abnormal sleep behaviors, loss of smell, no stiffness, rigidity or slowing of movement.   Strong h/o PAD - AAA s/p endovascular repair 2023 Randie Heinz).  Known eliquis on tikosyn and eliquis.  Carotid US 07/2022: 1-39% BICA stenosis, normal vertebral and subclavian arteries  Continues repatha Q2 wks along with crestor 5mg  MWF.   MDD with increasing anxiety - longstanding on sertraline 100mg  daily with klonopin nightly.   Having worsening bilateral posterior thigh pain worse with walking.  No leg swelling. No lower back pain.  Continues increased activity at home due to husband's limitations.  No results found for: "CKTOTAL"   Notes 3-4 weeks of increased urinary frequency, dysuria, urgency without hematuria, fevers/chills, nausea/vomiting or flank pain or abdominal pain.      Relevant past medical, surgical, family and social history reviewed and updated as indicated. Interim medical history since our last visit reviewed. Allergies and medications reviewed  and updated. Outpatient Medications Prior to Visit  Medication Sig Dispense Refill   acetaminophen (TYLENOL) 500 MG tablet Take 500-1,000 mg by mouth every 6 (six) hours as needed (knee pain.).     apixaban (ELIQUIS) 5 MG TABS tablet Take 1 tablet by mouth twice daily 180 tablet 1   cetirizine (ZYRTEC) 10 MG tablet Take 10 mg by mouth daily as needed for allergies.     Cholecalciferol (VITAMIN D3) 1000 UNITS CAPS Take 1 capsule (1,000 Units total) by mouth daily.     clonazePAM (KLONOPIN) 1 MG tablet Take 1 tablet by mouth twice daily as needed 60 tablet 1   dofetilide (TIKOSYN) 125 MCG capsule TAKE ONE CAPSULE BY MOUTH TWICE A DAY 180 capsule 3   Evolocumab (REPATHA SURECLICK) 140 MG/ML SOAJ Inject 140 mg into the skin every 14 (fourteen) days. 6 mL 3   famotidine (PEPCID) 20 MG tablet TAKE 1 TABLET BY MOUTH AT BEDTIME 90 tablet 1   gabapentin (NEURONTIN) 300 MG capsule TAKE ONE CAPSULE EVERY MORNING AND 2 CAPSULES AT BEDTIME 270 capsule 2   losartan (COZAAR) 100 MG tablet Take 1 tablet by mouth once daily 90 tablet 0   Multiple Vitamin (MULTIVITAMIN WITH MINERALS) TABS tablet Take 1 tablet by mouth daily.     oxymetazoline (AFRIN) 0.05 % nasal spray Place 1 spray into both nostrils 2 (two) times daily as needed for congestion.     pantoprazole (PROTONIX) 40 MG tablet Take 1 tablet (  40 mg total) by mouth 2 (two) times daily. 180 tablet 3   predniSONE (DELTASONE) 20 MG tablet Take two tablets daily for 3 days followed by one tablet daily for 4 days 10 tablet 0   rosuvastatin (CRESTOR) 5 MG tablet TAKE 1 TABLET BY MOUTH THREE TIMES A WEEK 45 tablet 2   sertraline (ZOLOFT) 100 MG tablet Take 1 tablet by mouth once daily 90 tablet 3   traMADol (ULTRAM) 50 MG tablet TAKE 1 TABLET BY MOUTH TWICE DAILY AS NEEDED FOR MODERATE PAIN 30 tablet 2   vitamin B-12 (CYANOCOBALAMIN) 500 MCG tablet Take 500 mcg by mouth daily.      No facility-administered medications prior to visit.     Per HPI unless  specifically indicated in ROS section below Review of Systems  Objective:  BP 122/68   Pulse (!) 51   Temp (!) 97.4 F (36.3 C) (Temporal)   Ht 5' 3.75" (1.619 m)   Wt 150 lb 2 oz (68.1 kg)   SpO2 94%   BMI 25.97 kg/m   Wt Readings from Last 3 Encounters:  03/01/23 150 lb 2 oz (68.1 kg)  02/05/23 147 lb 4 oz (66.8 kg)  12/26/22 149 lb (67.6 kg)      Physical Exam Vitals and nursing note reviewed.  Constitutional:      Appearance: Normal appearance. She is not ill-appearing.  HENT:     Head: Normocephalic and atraumatic.     Mouth/Throat:     Mouth: Mucous membranes are moist.     Pharynx: Oropharynx is clear. No oropharyngeal exudate or posterior oropharyngeal erythema.  Eyes:     Extraocular Movements: Extraocular movements intact.     Pupils: Pupils are equal, round, and reactive to light.  Cardiovascular:     Rate and Rhythm: Normal rate and regular rhythm.     Pulses: Normal pulses.     Heart sounds: Normal heart sounds. No murmur heard. Pulmonary:     Effort: Pulmonary effort is normal. No respiratory distress.     Breath sounds: Normal breath sounds. No wheezing, rhonchi or rales.  Musculoskeletal:     Right lower leg: No edema.     Left lower leg: No edema.     Comments:  Diminished pedal pulses bilaterally  No calf or posterior thigh pain or cords to palpation  Neg seated SLR on right No pain with rotation of femur in hip joint on right  Skin:    General: Skin is warm and dry.     Findings: No rash.  Neurological:     Mental Status: She is alert.  Psychiatric:        Mood and Affect: Mood normal.        Behavior: Behavior normal.       Results for orders placed or performed in visit on 03/01/23  POCT Urinalysis Dipstick (Automated)  Result Value Ref Range   Color, UA yellow    Clarity, UA clear    Glucose, UA Negative Negative   Bilirubin, UA negative    Ketones, UA negative    Spec Grav, UA 1.020 1.010 - 1.025   Blood, UA negative    pH, UA  5.5 5.0 - 8.0   Protein, UA Negative Negative   Urobilinogen, UA 0.2 0.2 or 1.0 E.U./dL   Nitrite, UA negative    Leukocytes, UA Small (1+) (A) Negative    Assessment & Plan:   Problem List Items Addressed This Visit   None Visit Diagnoses  Pain in posterior right lower extremity    -  Primary   Relevant Orders   CK   Dysuria       Relevant Orders   POCT Urinalysis Dipstick (Automated) (Completed)   Urine Culture        No orders of the defined types were placed in this encounter.   Orders Placed This Encounter  Procedures   Urine Culture   CK   POCT Urinalysis Dipstick (Automated)    Patient Instructions  Urinalysis today  Lab test today  Continue current medicines for now.  Keep neurology appointment for October and vascular surgery appointment as well. If muscle function is normal, we may repeat leg artery evaluation.    Follow up plan: Return if symptoms worsen or fail to improve.  Eustaquio Boyden, MD

## 2023-03-01 NOTE — Patient Instructions (Addendum)
Urinalysis today  Lab test today  Continue current medicines for now.  Keep neurology appointment for October and vascular surgery appointment as well. If muscle function is normal, we may repeat leg artery evaluation.

## 2023-03-02 ENCOUNTER — Encounter: Payer: Self-pay | Admitting: Family Medicine

## 2023-03-02 NOTE — Assessment & Plan Note (Signed)
Upcoming neurology eval scheduled 04/2023 ([facility] at [facility]).  She will keep this appt.  Avoiding aricept, didn't tolerate namenda.

## 2023-03-02 NOTE — Assessment & Plan Note (Signed)
Weeks of dysuria with other UTI symptoms.  UA today with small amt LE, but microscopy not too obvious for infection. Will await UCx results.

## 2023-03-02 NOTE — Assessment & Plan Note (Addendum)
R>L posterior thigh pain, worse with walking. She does have PAD.  Check CPK in statin use. If normal, consider updated ABIs (last done 08/2021).  She has been more physically active - has had to take over many responsibilities due to husband's physical limitations. This could contribute to worsening myalgias as well.

## 2023-03-02 NOTE — Assessment & Plan Note (Addendum)
She continues low dose crestor 5mg  MWF with biweekly Repatha.  LDL at goal <70

## 2023-03-03 LAB — URINE CULTURE
MICRO NUMBER:: 15342032
SPECIMEN QUALITY:: ADEQUATE

## 2023-03-04 ENCOUNTER — Other Ambulatory Visit: Payer: Self-pay | Admitting: Family Medicine

## 2023-03-04 MED ORDER — AMOXICILLIN 875 MG PO TABS: 875.0000 mg | ORAL_TABLET | Freq: Two times a day (BID) | ORAL | 0 refills | Status: AC

## 2023-03-07 ENCOUNTER — Ambulatory Visit (HOSPITAL_COMMUNITY)
Admission: RE | Admit: 2023-03-07 | Discharge: 2023-03-07 | Disposition: A | Discharge status: Home / Self Care | Payer: Medicare Other | Source: Ambulatory Visit | Attending: Family Medicine | Admitting: Family Medicine

## 2023-03-07 DIAGNOSIS — I739 Peripheral vascular disease, unspecified: Secondary | ICD-10-CM | POA: Insufficient documentation

## 2023-03-07 DIAGNOSIS — M79604 Pain in right leg: Secondary | ICD-10-CM | POA: Diagnosis not present

## 2023-03-07 LAB — VAS US ABI WITH/WO TBI
Left ABI: 1.1
Right ABI: 1.04

## 2023-03-08 ENCOUNTER — Telehealth: Payer: Self-pay | Admitting: Family Medicine

## 2023-03-08 NOTE — Telephone Encounter (Signed)
Patient called in returning call she received. Relayed results to patient and she stated that she understood. Stated that she will call with an update in a week after taking the medication and informed her about the referral for her leg artery evaluation and them reaching out to her. Thank you!

## 2023-03-11 ENCOUNTER — Other Ambulatory Visit: Payer: Self-pay | Admitting: Internal Medicine

## 2023-03-11 DIAGNOSIS — I4819 Other persistent atrial fibrillation: Secondary | ICD-10-CM

## 2023-03-11 NOTE — Telephone Encounter (Signed)
Prescription refill request for Eliquis received. Indication: Afib  Last office visit: 05/24/22 (End)  Scr: 1.32 (02/05/23)  Age: 77 Weight: 68.1kg  Appropriate dose. Refill sent.

## 2023-03-11 NOTE — Telephone Encounter (Signed)
Refill request

## 2023-03-14 NOTE — Telephone Encounter (Addendum)
Noted. (See Labs, Result Notes- 03/01/23.)

## 2023-03-15 ENCOUNTER — Telehealth: Payer: Self-pay

## 2023-03-15 NOTE — Telephone Encounter (Signed)
Attempted to contact pt. No answer, no vm. Need to relay leg ultrasound result and Dr Timoteo Expose message (see CV Proc, Result Notes- 03/07/23). I also mailed results.  US/Dr G's msg: Your ultrasound returned normal - leg pain not likely due to arterial issue.

## 2023-03-19 NOTE — Telephone Encounter (Signed)
Attempted to contact pt. No answer, no vm. Need to relay leg ultrasound result and Dr Timoteo Expose message (see CV Proc, Result Notes- 03/07/23). Also, results were mailed on 03/15/23.   US/Dr G's msg: Your ultrasound returned normal - leg pain not likely due to arterial issue.

## 2023-03-22 ENCOUNTER — Ambulatory Visit (INDEPENDENT_AMBULATORY_CARE_PROVIDER_SITE_OTHER): Payer: Medicare Other | Admitting: Family Medicine

## 2023-03-22 ENCOUNTER — Encounter: Payer: Self-pay | Admitting: Family Medicine

## 2023-03-22 VITALS — BP 122/68 | HR 56 | Temp 97.3°F | Ht 63.75 in | Wt 144.5 lb

## 2023-03-22 DIAGNOSIS — N3000 Acute cystitis without hematuria: Secondary | ICD-10-CM

## 2023-03-22 DIAGNOSIS — I4819 Other persistent atrial fibrillation: Secondary | ICD-10-CM | POA: Diagnosis not present

## 2023-03-22 DIAGNOSIS — R300 Dysuria: Secondary | ICD-10-CM | POA: Diagnosis not present

## 2023-03-22 DIAGNOSIS — D631 Anemia in chronic kidney disease: Secondary | ICD-10-CM

## 2023-03-22 DIAGNOSIS — R519 Headache, unspecified: Secondary | ICD-10-CM

## 2023-03-22 DIAGNOSIS — F5104 Psychophysiologic insomnia: Secondary | ICD-10-CM

## 2023-03-22 DIAGNOSIS — M79604 Pain in right leg: Secondary | ICD-10-CM

## 2023-03-22 DIAGNOSIS — N1832 Chronic kidney disease, stage 3b: Secondary | ICD-10-CM

## 2023-03-22 DIAGNOSIS — G3184 Mild cognitive impairment, so stated: Secondary | ICD-10-CM | POA: Diagnosis not present

## 2023-03-22 DIAGNOSIS — Z79899 Other long term (current) drug therapy: Secondary | ICD-10-CM

## 2023-03-22 DIAGNOSIS — F331 Major depressive disorder, recurrent, moderate: Secondary | ICD-10-CM

## 2023-03-22 LAB — POC URINALSYSI DIPSTICK (AUTOMATED)
Bilirubin, UA: NEGATIVE
Blood, UA: NEGATIVE
Glucose, UA: NEGATIVE
Ketones, UA: NEGATIVE
Nitrite, UA: NEGATIVE
Protein, UA: NEGATIVE
Spec Grav, UA: 1.025 (ref 1.010–1.025)
Urobilinogen, UA: 0.2 U/dL
pH, UA: 5.5 (ref 5.0–8.0)

## 2023-03-22 MED ORDER — CEPHALEXIN 500 MG PO CAPS: 500.0000 mg | ORAL_CAPSULE | Freq: Two times a day (BID) | ORAL | 0 refills | Status: DC

## 2023-03-22 NOTE — Patient Instructions (Addendum)
Drop klonopin dose to 1/2 tablet at night (0.5mg ) we will try to slowly taper off this medicine.  Try lower sertraline dose to 1/2 tablet daily (50mg ).  UTI again suspicious for infection. Urine culture sent. Start keflex antibitic 500mg  twice daily for 1 week. Let us know if ongoing symptoms after treatment.  For progressive memory difficulty, I'd like to proceed with brain MRI - we will call you to schedule this.

## 2023-03-22 NOTE — Progress Notes (Unsigned)
Ph: [phone] Fax: [phone]   Patient ID: Ann White, female    DOB: [birth year], 77 y.o.   MRN: [id number]  This visit was conducted in person.  BP 122/68   Pulse (!) 56   Temp (!) 97.3 F (36.3 C) (Temporal)   Ht 5' 3.75" (1.619 m)   Wt 144 lb 8 oz (65.5 kg)   SpO2 97%   BMI 25.00 kg/m    CC: UTI, worsening memory Subjective:   HPI: Ann White is a 77 y.o. female presenting on 03/22/2023 for Dysuria (C/o pain/burning when urinating and vaginal pain. Per daughter, pt's memory is worsening. Started about 3 wks ago. Pt accompanied by husband, Ann White and daughter, Ann White. )   3 wk h/o dysuria, vaginal pain with urination. Nocturia x2-3 at night time.  No new discharge or vulvar rash No hematuria, urgency, frequency, flank pain, nausea/vomiting, fevers/chills.  No h/o kidney stones.  Last visit 03/01/2023 dx UTI treated with amoxicillin 875mg  BID x7 days, UCx grew E coli sensitive to amoxicillin.  Prior UTI treated 05/2022 presented to ER with generalized weakness  Recently worsening memory - evaluated earlier in the year with MMSE 24/30, CDT 2/4 (10/2022) thought MCI with amnestic memory loss.   Did not tolerate namenda. Avoiding aricept in GERD hx and afib history (on Tikosyn).  Lab workup - normal ESR/CRP, CPK, chronic anemia Hgb 10.5, mildly low WBC 3.8, CKD Cr 1.32 GFR 39, RPR and TSH normal, B12 low normal 386 (continues b12 replacement daily).  Pending neurology eval 05/14/2023 (Athar @ [facility]).  No hallucinations, no tremor or stiffness, anosmia or loss of taste. No abnormal REM sleep behaviors noted.   They note acute worsening memory over the past several weeks - asks about 2nd sister but only has one.  Speech is slower, thinking is slower, forgetful of words.  Sleeping more during the day.  Notes headaches to posterior head.   2d ago hit forehead on door while carrying trunk outside - abrasion with bruising to forehead and nasal bridge. Memory  worsened prior to this.   Bilateral posterior thigh pain with walking - this is actually better - managing with tylenol.  ABIs WNL 02/2023  Known afib on eliquis and tikosyn  She did receive R knee steroid injection recently.   Denies chest pain, dyspnea, dizziness, palpitations.      Relevant past medical, surgical, family and social history reviewed and updated as indicated. Interim medical history since our last visit reviewed. Allergies and medications reviewed and updated. Outpatient Medications Prior to Visit  Medication Sig Dispense Refill   acetaminophen (TYLENOL) 500 MG tablet Take 500-1,000 mg by mouth every 6 (six) hours as needed (knee pain.).     apixaban (ELIQUIS) 5 MG TABS tablet Take 1 tablet by mouth twice daily 180 tablet 1   cetirizine (ZYRTEC) 10 MG tablet Take 10 mg by mouth daily as needed for allergies.     Cholecalciferol (VITAMIN D3) 1000 UNITS CAPS Take 1 capsule (1,000 Units total) by mouth daily.     dofetilide (TIKOSYN) 125 MCG capsule TAKE ONE CAPSULE BY MOUTH TWICE A DAY 180 capsule 3   Evolocumab (REPATHA SURECLICK) 140 MG/ML SOAJ Inject 140 mg into the skin every 14 (fourteen) days. 6 mL 3   famotidine (PEPCID) 20 MG tablet TAKE 1 TABLET BY MOUTH AT BEDTIME 90 tablet 1   losartan (COZAAR) 100 MG tablet Take 1 tablet by mouth once daily 90 tablet 0   Multiple Vitamin (  MULTIVITAMIN WITH MINERALS) TABS tablet Take 1 tablet by mouth daily.     oxymetazoline (AFRIN) 0.05 % nasal spray Place 1 spray into both nostrils 2 (two) times daily as needed for congestion.     pantoprazole (PROTONIX) 40 MG tablet Take 1 tablet (40 mg total) by mouth 2 (two) times daily. 180 tablet 3   rosuvastatin (CRESTOR) 5 MG tablet TAKE 1 TABLET BY MOUTH THREE TIMES A WEEK 45 tablet 2   traMADol (ULTRAM) 50 MG tablet TAKE 1 TABLET BY MOUTH TWICE DAILY AS NEEDED FOR MODERATE PAIN 30 tablet 2   vitamin B-12 (CYANOCOBALAMIN) 500 MCG tablet Take 500 mcg by mouth daily.      clonazePAM  (KLONOPIN) 1 MG tablet Take 1 tablet by mouth twice daily as needed 60 tablet 1   gabapentin (NEURONTIN) 300 MG capsule TAKE ONE CAPSULE EVERY MORNING AND 2 CAPSULES AT BEDTIME 270 capsule 2   predniSONE (DELTASONE) 20 MG tablet Take two tablets daily for 3 days followed by one tablet daily for 4 days 10 tablet 0   sertraline (ZOLOFT) 100 MG tablet Take 1 tablet by mouth once daily 90 tablet 3   clonazePAM (KLONOPIN) 1 MG tablet Take 0.5 tablets (0.5 mg total) by mouth at bedtime.     gabapentin (NEURONTIN) 300 MG capsule Take 1 capsule (300 mg total) by mouth at bedtime.     sertraline (ZOLOFT) 100 MG tablet Take 0.5 tablets (50 mg total) by mouth daily.     No facility-administered medications prior to visit.     Per HPI unless specifically indicated in ROS section below Review of Systems  Objective:  BP 122/68   Pulse (!) 56   Temp (!) 97.3 F (36.3 C) (Temporal)   Ht 5' 3.75" (1.619 m)   Wt 144 lb 8 oz (65.5 kg)   SpO2 97%   BMI 25.00 kg/m   Wt Readings from Last 3 Encounters:  03/22/23 144 lb 8 oz (65.5 kg)  03/01/23 150 lb 2 oz (68.1 kg)  02/05/23 147 lb 4 oz (66.8 kg)      Physical Exam Vitals and nursing note reviewed.  Constitutional:      Appearance: Normal appearance. She is not ill-appearing.  HENT:     Head: Normocephalic and atraumatic.     Mouth/Throat:     Mouth: Mucous membranes are moist.     Pharynx: Oropharynx is clear. No oropharyngeal exudate or posterior oropharyngeal erythema.  Eyes:     Extraocular Movements: Extraocular movements intact.     Conjunctiva/sclera: Conjunctivae normal.     Pupils: Pupils are equal, round, and reactive to light.  Cardiovascular:     Rate and Rhythm: Bradycardia present. Rhythm irregularly irregular.     Pulses: Normal pulses.     Heart sounds: Normal heart sounds. No murmur heard. Pulmonary:     Effort: Pulmonary effort is normal. No respiratory distress.     Breath sounds: Normal breath sounds. No wheezing,  rhonchi or rales.  Musculoskeletal:     Right lower leg: No edema.     Left lower leg: No edema.  Skin:    General: Skin is warm and dry.     Findings: No rash.  Neurological:     Mental Status: She is alert.     Cranial Nerves: Cranial nerves 2-12 are intact.     Sensory: Sensation is intact.     Motor: Motor function is intact.     Coordination: Coordination is intact.  Gait: Gait is intact.     Comments:  CN 2-12 intact  FTN intact EOMI  Psychiatric:        Mood and Affect: Mood normal.        Behavior: Behavior normal.       Results for orders placed or performed in visit on 03/22/23  POCT Urinalysis Dipstick (Automated)  Result Value Ref Range   Color, UA yellow    Clarity, UA clear    Glucose, UA Negative Negative   Bilirubin, UA negative    Ketones, UA negative    Spec Grav, UA 1.025 1.010 - 1.025   Blood, UA negative    pH, UA 5.5 5.0 - 8.0   Protein, UA Negative Negative   Urobilinogen, UA 0.2 0.2 or 1.0 E.U./dL   Nitrite, UA negative    Leukocytes, UA Small (1+) (A) Negative      03/22/2023   12:00 PM 03/01/2023   12:05 PM 02/05/2023    8:45 AM 10/30/2022    3:30 PM 07/13/2022   10:30 AM  Depression screen PHQ 2/9  Decreased Interest 0 0 0 1 0  Down, Depressed, Hopeless 0 0 0 1 0  PHQ - 2 Score 0 0 0 2 0  Altered sleeping 0 0 1 0   Tired, decreased energy 1 1 1 2    Change in appetite 0 0 0 0   Feeling bad or failure about yourself  0 0 0 1   Trouble concentrating 0 1 0 0   Moving slowly or fidgety/restless 3 0 0 0   Suicidal thoughts 0 0 0 0   PHQ-9 Score 4 2 2 5    Difficult doing work/chores Somewhat difficult Not difficult at all Not difficult at all Somewhat difficult        03/22/2023   12:00 PM 03/01/2023   12:06 PM 02/05/2023    8:46 AM 10/30/2022    3:31 PM  GAD 7 : Generalized Anxiety Score  Nervous, Anxious, on Edge 1 0 0 1  Control/stop worrying 2 1 2 2   Worry too much - different things 2 1 2 1   Trouble relaxing 0 0 0 1  Restless 1  0 0 1  Easily annoyed or irritable 0 1 1 0  Afraid - awful might happen 2 1 0 2  Total GAD 7 Score 8 4 5 8   Anxiety Difficulty Not difficult at all Not difficult at all Somewhat difficult Somewhat difficult   MRI HEAD WITHOUT CONTRAST TECHNIQUE: Multiplanar, multiecho pulse sequences of the brain and surrounding structures were obtained without intravenous contrast.  COMPARISON:  CT head June 01, 2022. FINDINGS: Brain: No acute infarction, hemorrhage, hydrocephalus, extra-axial collection or mass lesion. Patchy T2/FLAIR hyperintensities within the white matter, nonspecific but compatible with chronic microvascular ischemic disease. Vascular: Major arterial flow voids are maintained at the skull base.  Skull and upper cervical spine: Normal marrow signal. Sinuses/Orbits: No acute orbital findings.  Largely clear sinuses. Other: No significant mastoid effusions. IMPRESSION: No evidence of acute intracranial abnormality. Electronically Signed   By: Feliberto Harts M.D.   On: 06/01/2022 14:45  Assessment & Plan:   Problem List Items Addressed This Visit     MDD (major depressive disorder), recurrent episode, moderate (HCC)    Stable period on sertraline 100mg  daily - will drop dose to 50mg  daily.       Relevant Medications   sertraline (ZOLOFT) 100 MG tablet   Chronic kidney disease, stage 3b (HCC)   Pain  in posterior right lower extremity    This is now better, unclear cause      Anemia due to stage 3 chronic kidney disease (HCC)   Persistent atrial fibrillation (HCC)    Followed by EP on tikosyn and eliquis.       Chronic insomnia    Chronic issue, longstanding managed by klonopin. Will drop dose as per below.       Polypharmacy    Meds reviewed - will drop medication that could contribute to worsening memory.  Drop klonopin to 0.5mg  nightly. She has already dropped gabapentin dose to 300mg  nightly.  Mood stable - will drop sertraline to 100mg  1/2 tab (50mg )  daily.       UTI (urinary tract infection)    Ongoing UTI symptoms of dysuria and vaginal discomfort, UA persistently +LE. This is after 7d amoxicillin course for E coli UTI mid August.  Will Rx keflex antibiotic course while we await UCx results.  This could contribute to worsening memory difficulty.  Prior to this past month, last UTI was 05/2022 presenting with generalized weakness s/p ER eval.       Relevant Medications   cephALEXin (KEFLEX) 500 MG capsule   Other Relevant Orders   Urine Culture   Amnestic MCI (mild cognitive impairment with memory loss) - Primary    Worsening memory noted over the past year, with acute deterioration over the last several weeks. Prior MMSE 24/30, CDT 2/4 (10/2022).  Intolerant to namenda. Avoiding donepezil in h/o GERD and afib on Tikosyn.  UTI could contribute - start keflex, await UCx.  ?vascular dementia - update brain imaging.  Meds reviewed - see below. Drop klonopin dose.       Relevant Orders   MR Brain Wo Contrast   Other Visit Diagnoses     Nonintractable episodic headache, unspecified headache type       Relevant Medications   clonazePAM (KLONOPIN) 1 MG tablet   gabapentin (NEURONTIN) 300 MG capsule   sertraline (ZOLOFT) 100 MG tablet   Other Relevant Orders   MR Brain Wo Contrast        Meds ordered this encounter  Medications   cephALEXin (KEFLEX) 500 MG capsule    Sig: Take 1 capsule (500 mg total) by mouth 2 (two) times daily.    Dispense:  14 capsule    Refill:  0    Orders Placed This Encounter  Procedures   Urine Culture   MR Brain Wo Contrast    Standing Status:   Future    Standing Expiration Date:   03/21/2024    Scheduling Instructions:     Pt has AAA stent in place    Order Specific Question:   What is the patient's sedation requirement?    Answer:   No Sedation    Order Specific Question:   Does the patient have a pacemaker or implanted devices?    Answer:   No    Order Specific Question:   Preferred  imaging location?    Answer:   [facility] (table limit-350lbs)   POCT Urinalysis Dipstick (Automated)    Patient Instructions  Drop klonopin dose to 1/2 tablet at night (0.5mg ) we will try to slowly taper off this medicine.  Try lower sertraline dose to 1/2 tablet daily (50mg ).  UTI again suspicious for infection. Urine culture sent. Start keflex antibitic 500mg  twice daily for 1 week. Let us know if ongoing symptoms after treatment.  For progressive memory difficulty, I'd like to proceed  with brain MRI - we will call you to schedule this.   Follow up plan: No follow-ups on file.  Eustaquio Boyden, MD

## 2023-03-23 NOTE — Assessment & Plan Note (Signed)
Worsening memory noted over the past year, with acute deterioration over the last several weeks. Prior MMSE 24/30, CDT 2/4 (10/2022).  Intolerant to namenda. Avoiding donepezil in h/o GERD and afib on Tikosyn.  UTI could contribute - start keflex, await UCx.  ?vascular dementia - update brain imaging.  Meds reviewed - see below. Drop klonopin dose.

## 2023-03-23 NOTE — Assessment & Plan Note (Addendum)
Meds reviewed - will drop medication that could contribute to worsening memory.  Drop klonopin to 0.5mg  nightly. She has already dropped gabapentin dose to 300mg  nightly.  Mood stable - will drop sertraline to 100mg  1/2 tab (50mg ) daily.

## 2023-03-23 NOTE — Assessment & Plan Note (Addendum)
Ongoing UTI symptoms of dysuria and vaginal discomfort, UA persistently +LE. This is after 7d amoxicillin course for E coli UTI mid August.  Will Rx keflex antibiotic course while we await UCx results.  This could contribute to worsening memory difficulty.  Prior to this past month, last UTI was 05/2022 presenting with generalized weakness s/p ER eval.

## 2023-03-23 NOTE — Assessment & Plan Note (Signed)
Followed by EP on tikosyn and eliquis.

## 2023-03-23 NOTE — Assessment & Plan Note (Signed)
Stable period on sertraline 100mg  daily - will drop dose to 50mg  daily.

## 2023-03-23 NOTE — Assessment & Plan Note (Signed)
Chronic issue, longstanding managed by klonopin. Will drop dose as per below.

## 2023-03-23 NOTE — Assessment & Plan Note (Signed)
This is now better, unclear cause

## 2023-03-25 LAB — URINE CULTURE
MICRO NUMBER:: 15432118
SPECIMEN QUALITY:: ADEQUATE

## 2023-03-27 ENCOUNTER — Telehealth: Payer: Self-pay

## 2023-03-27 NOTE — Telephone Encounter (Signed)
Attempted to contact pt. No answer, no vm. Need to relay results and Dr Timoteo Expose message (see Labs, Result Notes- 03/22/23).  Labs/Dr G's msg: Yoru urine culture returned growing E coli bacteria sensitive to antibiotic chosen. Let us know if ongoing urinary symptoms after Keflex antibiotic course.

## 2023-03-28 NOTE — Telephone Encounter (Signed)
Attempted to contact pt. No answer, no vm. Need to relay results and Dr Timoteo Expose message (see Labs, Result Notes- 03/22/23).  Labs/Dr G's msg: Yoru urine culture returned growing E coli bacteria sensitive to antibiotic chosen. Let us know if ongoing urinary symptoms after Keflex antibiotic course.

## 2023-03-28 NOTE — Telephone Encounter (Signed)
Patient called in returning call she received. Relayed message below to patient.

## 2023-03-28 NOTE — Telephone Encounter (Signed)
Noted  

## 2023-04-03 ENCOUNTER — Other Ambulatory Visit: Payer: Self-pay | Admitting: Family Medicine

## 2023-04-08 ENCOUNTER — Encounter: Payer: Self-pay | Admitting: Family Medicine

## 2023-04-08 ENCOUNTER — Ambulatory Visit (INDEPENDENT_AMBULATORY_CARE_PROVIDER_SITE_OTHER): Payer: Medicare Other | Admitting: Family Medicine

## 2023-04-08 VITALS — BP 118/64 | HR 53 | Temp 97.2°F | Ht 63.75 in | Wt 144.0 lb

## 2023-04-08 DIAGNOSIS — N390 Urinary tract infection, site not specified: Secondary | ICD-10-CM

## 2023-04-08 DIAGNOSIS — F5104 Psychophysiologic insomnia: Secondary | ICD-10-CM

## 2023-04-08 DIAGNOSIS — R300 Dysuria: Secondary | ICD-10-CM

## 2023-04-08 DIAGNOSIS — F331 Major depressive disorder, recurrent, moderate: Secondary | ICD-10-CM | POA: Diagnosis not present

## 2023-04-08 DIAGNOSIS — G3184 Mild cognitive impairment, so stated: Secondary | ICD-10-CM | POA: Diagnosis not present

## 2023-04-08 LAB — POC URINALSYSI DIPSTICK (AUTOMATED)
Glucose, UA: NEGATIVE
Ketones, UA: NEGATIVE
Nitrite, UA: POSITIVE
Protein, UA: POSITIVE — AB
Spec Grav, UA: 1.02 (ref 1.010–1.025)
Urobilinogen, UA: 0.2 E.U./dL
pH, UA: 5.5 (ref 5.0–8.0)

## 2023-04-08 MED ORDER — CEPHALEXIN 500 MG PO CAPS: 500.0000 mg | ORAL_CAPSULE | Freq: Two times a day (BID) | ORAL | 0 refills | Status: DC

## 2023-04-08 MED ORDER — CLONAZEPAM 0.5 MG PO TABS: 0.5000 mg | ORAL_TABLET | Freq: Every evening | ORAL | 0 refills | Status: DC | PRN

## 2023-04-08 MED ORDER — SERTRALINE HCL 50 MG PO TABS: 50.0000 mg | ORAL_TABLET | Freq: Every day | ORAL | 1 refills | Status: DC

## 2023-04-08 NOTE — Progress Notes (Signed)
Ph: [phone] Fax: [phone]   Patient ID: Ann White, female    DOB: 1945-11-02, 77 y.o.   MRN: [id number]  This visit was conducted in person.  BP 118/64   Pulse (!) 53   Temp (!) 97.2 F (36.2 C) (Temporal)   Ht 5' 3.75" (1.619 m)   Wt 144 lb (65.3 kg)   SpO2 98%   BMI 24.91 kg/m    CC: possible UTI  Subjective:   HPI: Ann White is a 77 y.o. female presenting on 04/08/2023 for Dysuria (C/o pain when urinating, some confusion and vaginal pain. Seen for same sxs about 3 wks ago and treated. Sxs improved but did not clear up. Also, wants to discuss gabapentin. Pt accompanied by husband, [name]. )   See prior note for details.  Seen here 03/22/2023 with ongoing urinary symptoms including dysuria, vaginal pain with urination, nocturia, treated with keflex 7d course. Acute worsening memory difficulty in setting of recurrent UTI symptoms. We started tapering medications that could contribute to memory changes - dropping klonopin to 1/2 tab nightly (0.5mg ), dropping sertraline to 1/2 tab daily (50mg ).   They noted memory got better while on antibiotics, but once keflex course ended she started having vaginal dysuria lower abd discomfort, nocturia 2-3, urgency and frequency. No hematuria, fevers/chills, flank pain, nausea/vomiting. No vaginal discharge or rash.  No h/o kidney stones.  She is drinking Twist strawberry lemonades and water, no sodas or caffeine. No spicy foods.   Brain MRI pending 04/12/2023 for memory changes.   03/22/2023: recurrent UTI symptoms treated with keflex 500mg  BID 7d course 03/01/2023: UTI treated with amoxicillin 875mg  BID x7 days, UCx grew E coli sensitive to amoxicillin.  05/2022: presented to ER with generalized weakness dx UTI treated with keflex 250mg  QID 7d course     Relevant past medical, surgical, family and social history reviewed and updated as indicated. Interim medical history since our last visit reviewed. Allergies and  medications reviewed and updated. Outpatient Medications Prior to Visit  Medication Sig Dispense Refill   acetaminophen (TYLENOL) 500 MG tablet Take 500-1,000 mg by mouth every 6 (six) hours as needed (knee pain.).     apixaban (ELIQUIS) 5 MG TABS tablet Take 1 tablet by mouth twice daily 180 tablet 1   cetirizine (ZYRTEC) 10 MG tablet Take 10 mg by mouth daily as needed for allergies.     Cholecalciferol (VITAMIN D3) 1000 UNITS CAPS Take 1 capsule (1,000 Units total) by mouth daily.     dofetilide (TIKOSYN) 125 MCG capsule TAKE ONE CAPSULE BY MOUTH TWICE A DAY 180 capsule 3   Evolocumab (REPATHA SURECLICK) 140 MG/ML SOAJ Inject 140 mg into the skin every 14 (fourteen) days. 6 mL 3   famotidine (PEPCID) 20 MG tablet TAKE 1 TABLET BY MOUTH AT BEDTIME 90 tablet 1   gabapentin (NEURONTIN) 300 MG capsule Take 1 capsule (300 mg total) by mouth at bedtime.     losartan (COZAAR) 100 MG tablet Take 1 tablet by mouth once daily 90 tablet 0   Multiple Vitamin (MULTIVITAMIN WITH MINERALS) TABS tablet Take 1 tablet by mouth daily.     oxymetazoline (AFRIN) 0.05 % nasal spray Place 1 spray into both nostrils 2 (two) times daily as needed for congestion.     pantoprazole (PROTONIX) 40 MG tablet Take 1 tablet (40 mg total) by mouth 2 (two) times daily. 180 tablet 3   rosuvastatin (CRESTOR) 5 MG tablet TAKE 1 TABLET BY MOUTH THREE TIMES A  WEEK 45 tablet 2   traMADol (ULTRAM) 50 MG tablet TAKE 1 TABLET BY MOUTH TWICE DAILY AS NEEDED FOR MODERATE PAIN 30 tablet 2   vitamin B-12 (CYANOCOBALAMIN) 500 MCG tablet Take 500 mcg by mouth daily.      clonazePAM (KLONOPIN) 1 MG tablet Take 0.5 tablets (0.5 mg total) by mouth at bedtime.     sertraline (ZOLOFT) 100 MG tablet Take 1 tablet by mouth once daily 90 tablet 0   cephALEXin (KEFLEX) 500 MG capsule Take 1 capsule (500 mg total) by mouth 2 (two) times daily. 14 capsule 0   No facility-administered medications prior to visit.     Per HPI unless specifically  indicated in ROS section below Review of Systems  Objective:  BP 118/64   Pulse (!) 53   Temp (!) 97.2 F (36.2 C) (Temporal)   Ht 5' 3.75" (1.619 m)   Wt 144 lb (65.3 kg)   SpO2 98%   BMI 24.91 kg/m   Wt Readings from Last 3 Encounters:  04/08/23 144 lb (65.3 kg)  03/22/23 144 lb 8 oz (65.5 kg)  03/01/23 150 lb 2 oz (68.1 kg)      Physical Exam Vitals and nursing note reviewed.  Constitutional:      Appearance: Normal appearance. She is not ill-appearing.  HENT:     Mouth/Throat:     Mouth: Mucous membranes are moist.     Pharynx: Oropharynx is clear. No oropharyngeal exudate or posterior oropharyngeal erythema.  Eyes:     Extraocular Movements: Extraocular movements intact.     Pupils: Pupils are equal, round, and reactive to light.  Cardiovascular:     Rate and Rhythm: Normal rate and regular rhythm.     Pulses: Normal pulses.     Heart sounds: Normal heart sounds. No murmur heard. Pulmonary:     Effort: Pulmonary effort is normal. No respiratory distress.     Breath sounds: Normal breath sounds. No wheezing, rhonchi or rales.  Abdominal:     General: Bowel sounds are normal. There is no distension.     Palpations: Abdomen is soft. There is no mass.     Tenderness: There is abdominal tenderness (mild) in the suprapubic area. There is no right CVA tenderness, left CVA tenderness, guarding or rebound.     Hernia: No hernia is present.  Musculoskeletal:     Right lower leg: No edema.     Left lower leg: No edema.  Skin:    General: Skin is warm and dry.     Findings: No rash.  Neurological:     Mental Status: She is alert.  Psychiatric:        Mood and Affect: Mood normal.        Behavior: Behavior normal.       Results for orders placed or performed in visit on 04/08/23  POCT Urinalysis Dipstick (Automated)  Result Value Ref Range   Color, UA Yellow    Clarity, UA Clear    Glucose, UA Negative Negative   Bilirubin, UA 1+    Ketones, UA neg    Spec  Grav, UA 1.020 1.010 - 1.025   Blood, UA trace    pH, UA 5.5 5.0 - 8.0   Protein, UA Positive (A) Negative   Urobilinogen, UA 0.2 0.2 or 1.0 E.U./dL   Nitrite, UA positive    Leukocytes, UA Large (3+) (A) Negative    Assessment & Plan:   Problem List Items Addressed This Visit  MDD (major depressive disorder), recurrent episode, moderate (HCC)    Mood remaining stable on lower sertraline 50mg  daily - continue.       Relevant Medications   sertraline (ZOLOFT) 50 MG tablet   Chronic insomnia    Sleep overall stable on lower klonopin 0.5mg  at night.       Recurrent UTI - Primary    Recurrent UTI symptoms despite completing appropriate antibiotic courses in the past month (amoxicillin followed by keflex). UA again suggestive of infection - UCx sent. UTI symptoms are associated with worsened memory/confusion.  Start keflex 500mg  BID 7d course. Refer to urology for evaluation of recurrent UTIs.       Relevant Medications   cephALEXin (KEFLEX) 500 MG capsule   Other Relevant Orders   Ambulatory referral to Urology   Urine Culture   Amnestic MCI (mild cognitive impairment with memory loss)    Worsening memory related to UTI symptoms - husband notes memory did improve while on antibiotics. Pending MRI later this week.       Other Visit Diagnoses     Dysuria       Relevant Orders   POCT Urinalysis Dipstick (Automated) (Completed)        Meds ordered this encounter  Medications   sertraline (ZOLOFT) 50 MG tablet    Sig: Take 1 tablet (50 mg total) by mouth daily.    Dispense:  90 tablet    Refill:  1    Use this dose   clonazePAM (KLONOPIN) 0.5 MG tablet    Sig: Take 1 tablet (0.5 mg total) by mouth at bedtime as needed for anxiety (sleep).    Dispense:  30 tablet    Refill:  0   cephALEXin (KEFLEX) 500 MG capsule    Sig: Take 1 capsule (500 mg total) by mouth 2 (two) times daily.    Dispense:  14 capsule    Refill:  0    Orders Placed This Encounter   Procedures   Urine Culture   Ambulatory referral to Urology    Referral Priority:   Routine    Referral Type:   Consultation    Referral Reason:   Specialty Services Required    Requested Specialty:   Urology    Number of Visits Requested:   1   POCT Urinalysis Dipstick (Automated)    Patient Instructions  Sertraline 50mg  and klonopin 0.5mg  sent to pharmacy (lower doses so you don't have to keep cutting in half) - take sertraline daily, klonopin nightly as needed.  For repeat urine infection, take another keflex course sent to pharmacy.  Urine sample sent for culture.  I will refer you to [facility] in [place] for recurrent UTIs. You may call them at [phone]  Follow up plan: Return if symptoms worsen or fail to improve.  Eustaquio Boyden, MD

## 2023-04-08 NOTE — Assessment & Plan Note (Signed)
Worsening memory related to UTI symptoms - husband notes memory did improve while on antibiotics. Pending MRI later this week.

## 2023-04-08 NOTE — Patient Instructions (Addendum)
Sertraline 50mg  and klonopin 0.5mg  sent to pharmacy (lower doses so you don't have to keep cutting in half) - take sertraline daily, klonopin nightly as needed.  For repeat urine infection, take another keflex course sent to pharmacy.  Urine sample sent for culture.  I will refer you to [facility] in [place] for recurrent UTIs. You may call them at [phone]

## 2023-04-08 NOTE — Assessment & Plan Note (Signed)
Mood remaining stable on lower sertraline 50mg  daily - continue.

## 2023-04-08 NOTE — Assessment & Plan Note (Signed)
Sleep overall stable on lower klonopin 0.5mg  at night.

## 2023-04-08 NOTE — Assessment & Plan Note (Addendum)
Recurrent UTI symptoms despite completing appropriate antibiotic courses in the past month (amoxicillin followed by keflex). UA again suggestive of infection - UCx sent. UTI symptoms are associated with worsened memory/confusion.  Start keflex 500mg  BID 7d course. Refer to urology for evaluation of recurrent UTIs.

## 2023-04-09 ENCOUNTER — Ambulatory Visit: Payer: Medicare Other

## 2023-04-11 LAB — URINE CULTURE
MICRO NUMBER:: 15502319
SPECIMEN QUALITY:: ADEQUATE

## 2023-04-12 ENCOUNTER — Ambulatory Visit
Admission: RE | Admit: 2023-04-12 | Discharge: 2023-04-12 | Disposition: A | Discharge status: Home / Self Care | Payer: Medicare Other | Source: Ambulatory Visit | Attending: Family Medicine | Admitting: Family Medicine

## 2023-04-12 DIAGNOSIS — R519 Headache, unspecified: Secondary | ICD-10-CM | POA: Diagnosis not present

## 2023-04-12 DIAGNOSIS — G3184 Mild cognitive impairment, so stated: Secondary | ICD-10-CM | POA: Insufficient documentation

## 2023-04-12 DIAGNOSIS — G319 Degenerative disease of nervous system, unspecified: Secondary | ICD-10-CM | POA: Diagnosis not present

## 2023-04-12 DIAGNOSIS — R413 Other amnesia: Secondary | ICD-10-CM | POA: Diagnosis not present

## 2023-04-12 DIAGNOSIS — I6782 Cerebral ischemia: Secondary | ICD-10-CM | POA: Diagnosis not present

## 2023-04-12 NOTE — Progress Notes (Unsigned)
Hydia Copelin T. Kamare Caspers, MD, CAQ Sports Medicine [facility] at [facility] [address] [place], [address]  Phone: [phone]  FAX: [phone]  Jenalyn Darden - 77 y.o. female  MRN [id number]  Date of Birth: 1945/09/10  Date: 04/15/2023  PCP: Eustaquio Boyden, MD  Referral: Eustaquio Boyden, MD  No chief complaint on file.  Subjective:   Lillye Mundell is a 77 y.o. very pleasant female patient with There is no height or weight on file to calculate BMI. who presents with the following:  She is a very pleasant lady, not recall her where from prior office visits.  She presents today with some ongoing right-sided knee osteoarthritis.  Last time I saw her in June, and did do an intra-articular injection.  That time, we also talked about trying out some Voltaren gel.    Review of Systems is noted in the HPI, as appropriate  Objective:   There were no vitals taken for this visit.  GEN: No acute distress; alert,appropriate. PULM: Breathing comfortably in no respiratory distress PSYCH: Normally interactive.   Laboratory and Imaging Data:  Assessment and Plan:   ***

## 2023-04-15 ENCOUNTER — Ambulatory Visit (INDEPENDENT_AMBULATORY_CARE_PROVIDER_SITE_OTHER): Payer: Medicare Other | Admitting: Family Medicine

## 2023-04-15 ENCOUNTER — Encounter: Payer: Self-pay | Admitting: Family Medicine

## 2023-04-15 VITALS — BP 126/64 | HR 62 | Temp 97.7°F | Ht 63.75 in | Wt 147.4 lb

## 2023-04-15 DIAGNOSIS — M1711 Unilateral primary osteoarthritis, right knee: Secondary | ICD-10-CM

## 2023-04-15 MED ORDER — TRIAMCINOLONE ACETONIDE 40 MG/ML IJ SUSP
40.0000 mg | Freq: Once | INTRAMUSCULAR | Status: AC
Start: 2023-04-15 — End: 2023-04-15
  Administered 2023-04-15: 40 mg via INTRA_ARTICULAR

## 2023-04-15 NOTE — Patient Instructions (Signed)
Voltaren 1% gel, over the counter ?You can apply up to 4 times a day ? ?This can be applied to any joint: knee, wrist, fingers, elbows, shoulders, feet and ankles. ?Can apply to any tendon: tennis elbow, achilles, tendon, rotator cuff or any other tendon. ? ?Minimal is absorbed in the bloodstream: ok with oral anti-inflammatory or a blood thinner. ? ?Cost is about 9 dollars  ?

## 2023-05-01 ENCOUNTER — Ambulatory Visit (INDEPENDENT_AMBULATORY_CARE_PROVIDER_SITE_OTHER): Payer: Medicare Other | Admitting: Vascular Surgery

## 2023-05-01 ENCOUNTER — Encounter: Payer: Self-pay | Admitting: Vascular Surgery

## 2023-05-01 VITALS — BP 145/75 | HR 56 | Temp 98.0°F | Ht 63.0 in | Wt 143.7 lb

## 2023-05-01 DIAGNOSIS — Z9889 Other specified postprocedural states: Secondary | ICD-10-CM | POA: Diagnosis not present

## 2023-05-01 NOTE — Progress Notes (Signed)
Patient ID: Ann White, female   DOB: 05-15-1946, 77 y.o.   MRN: [id number]  Reason for Consult: AAA   Referred by Eustaquio Boyden, MD  Subjective:     HPI:  Ann White is a 77 y.o. female has a history of endovascular aortic aneurysm repair approximately 18 months ago.  Initially was found to have type II endoleak on follow-up but diameter was unchanged.  More recently duplex demonstrated concern for type Ib endoleak and she was sent for CT scan and now follows up.  Unfortunately her CT scan was not performed.  She has no new changes in her medical history or atrial fibrillation.  She has no complaints related to today's visit.  Past Medical History:  Diagnosis Date   AAA (abdominal aortic aneurysm) (HCC)    Allergy    Anxiety    Arthritis    Carotid stenosis    a. 02/2016 Carotid U/S: Bilateral 40-59%; b. 03/2017 Carotid U/S: RICA 40-59%, LICA 1-39%; c. 04/2019 Carotid U/S: bilat 40-59% ICA dzs.   Cervical spondylosis    s/p spine injections   CKD (chronic kidney disease), stage III (HCC)    Depression    DVT (deep venous thrombosis) (HCC)    RLE DVT 05/27/15   Dysrhythmia    afib   Ectatic abdominal aorta (HCC) 2016   rpt [place] 5 yrs   GERD (gastroesophageal reflux disease)    History of chicken pox    History of diverticulitis of colon    HLD (hyperlipidemia)    HTN (hypertension)    Irritable bowel syndrome with constipation    Lichen sclerosus et atrophicus    NICM (nonischemic cardiomyopathy) (HCC)    a. 04/2017 Echo: EF 35-40%, diff HK; b. 05/2017 MV: EF 56%, no ischemia/infarct; c. 04/2018 Echo: EF 55-60%, no rwma, Gr2 DD; d. 03/2020 Echo: EF 40-45%, nl PASP, mod dil LA, mild to mod dil RA, Triv MR.   Obesity    Persistent atrial fibrillation (HCC)    a. Dx 03/2020. CHA2DS2VASc = 5-->xarelto 15mg  daily.   Premature atrial contraction    a. 04/2017 48hr Holter: Predominant rhythm - sinus. Rare PVC's, freq PAC's with freq atrial runs up to 34  beats-->metoprolol started.   Seasonal allergic rhinitis    Family History  Problem Relation Age of Onset   CAD Mother 47       MI   Hypertension Mother    Parkinson's disease Father    Diabetes Father    CAD Father 29       MI   Hyperlipidemia Sister    Cancer Neg Hx    Stroke Neg Hx    Colon cancer Neg Hx    Breast cancer Neg Hx    Colon polyps Neg Hx    Esophageal cancer Neg Hx    Stomach cancer Neg Hx    Rectal cancer Neg Hx    Past Surgical History:  Procedure Laterality Date   ABDOMINAL AORTIC ANEURYSM REPAIR  09/2021   [facility]   ABDOMINAL AORTIC ANEURYSM REPAIR     ABDOMINAL AORTIC ENDOVASCULAR STENT GRAFT  10/03/2021   Procedure: ABDOMINAL AORTIC ENDOVASCULAR STENT GRAFT;  Surgeon: Maeola Harman, MD;  Location: [facility] OR;  Service: Vascular;;   ABDOMINAL HYSTERECTOMY     bladder tack  1990s   BREAST BIOPSY Right 03/05/2012    benign   CARDIOVERSION N/A 05/17/2020   Procedure: CARDIOVERSION;  Surgeon: Yvonne Kendall, MD;  Location: [facility] ORS;  Service: Cardiovascular;  Laterality: N/A;   CARPAL TUNNEL RELEASE  2004, 2013   bilateral   CHOLECYSTECTOMY  1990s   COLONOSCOPY  2002   COLONOSCOPY  06/2013   mod diverticulosis, 3 tubular adenomas, int hem rpt 5 yrs Russella Dar)   COLONOSCOPY  06/2018   TA,c olonic angiodysplastic lesion, mod diverticulosis, rpt 5 yrs Russella Dar)   DEXA  07/2011   normal, T score -0.3   ESOPHAGOGASTRODUODENOSCOPY ENDOSCOPY  2004   normal ([facility])   TOTAL ABDOMINAL HYSTERECTOMY W/ BILATERAL SALPINGOOPHORECTOMY  1990s   complete, dysmenorrhea and fibroids   ULTRASOUND GUIDANCE FOR VASCULAR ACCESS  10/03/2021   Procedure: ULTRASOUND GUIDANCE FOR VASCULAR ACCESS;  Surgeon: Maeola Harman, MD;  Location: [facility] OR;  Service: Vascular;;    Short Social History:  Social History   Tobacco Use   Smoking status: Former    Current packs/day: 0.00    Average packs/day: 0.3 packs/day for 3.0 years (0.8 ttl pk-yrs)    Types: Cigarettes     Start date: 49    Quit date: 1990    Years since quitting: 34.8   Smokeless tobacco: Never   Tobacco comments:    minimal smoking history  Substance Use Topics   Alcohol use: No    Alcohol/week: 0.0 standard drinks of alcohol    Allergies  Allergen Reactions   Sulfa Antibiotics Swelling    Oral swelling   Nexletol [Bempedoic Acid]     Dizziness, "eyes felt funny"   Tricor [Fenofibrate] Other (See Comments)    myalgias   Zetia [Ezetimibe] Other (See Comments)    Dizziness   Contrast Media [Iodinated Contrast Media] Other (See Comments)    Oral contrast caused mouth blisters   Lipitor [Atorvastatin] Other (See Comments)    myalgias   Pravastatin Other (See Comments)    myalgias    Current Outpatient Medications  Medication Sig Dispense Refill   acetaminophen (TYLENOL) 500 MG tablet Take 500-1,000 mg by mouth every 6 (six) hours as needed (knee pain.).     apixaban (ELIQUIS) 5 MG TABS tablet Take 1 tablet by mouth twice daily 180 tablet 1   cephALEXin (KEFLEX) 500 MG capsule Take 1 capsule (500 mg total) by mouth 2 (two) times daily. 14 capsule 0   cetirizine (ZYRTEC) 10 MG tablet Take 10 mg by mouth daily as needed for allergies.     Cholecalciferol (VITAMIN D3) 1000 UNITS CAPS Take 1 capsule (1,000 Units total) by mouth daily.     clonazePAM (KLONOPIN) 0.5 MG tablet Take 1 tablet (0.5 mg total) by mouth at bedtime as needed for anxiety (sleep). 30 tablet 0   dofetilide (TIKOSYN) 125 MCG capsule TAKE ONE CAPSULE BY MOUTH TWICE A DAY 180 capsule 3   Evolocumab (REPATHA SURECLICK) 140 MG/ML SOAJ Inject 140 mg into the skin every 14 (fourteen) days. 6 mL 3   famotidine (PEPCID) 20 MG tablet TAKE 1 TABLET BY MOUTH AT BEDTIME 90 tablet 1   gabapentin (NEURONTIN) 300 MG capsule Take 1 capsule (300 mg total) by mouth at bedtime.     losartan (COZAAR) 100 MG tablet Take 1 tablet by mouth once daily 90 tablet 0   Multiple Vitamin (MULTIVITAMIN WITH MINERALS) TABS tablet Take 1  tablet by mouth daily.     oxymetazoline (AFRIN) 0.05 % nasal spray Place 1 spray into both nostrils 2 (two) times daily as needed for congestion.     pantoprazole (PROTONIX) 40 MG tablet Take 1 tablet (40 mg total) by mouth 2 (two) times daily.  180 tablet 3   rosuvastatin (CRESTOR) 5 MG tablet TAKE 1 TABLET BY MOUTH THREE TIMES A WEEK 45 tablet 2   sertraline (ZOLOFT) 50 MG tablet Take 1 tablet (50 mg total) by mouth daily. 90 tablet 1   traMADol (ULTRAM) 50 MG tablet TAKE 1 TABLET BY MOUTH TWICE DAILY AS NEEDED FOR MODERATE PAIN 30 tablet 2   vitamin B-12 (CYANOCOBALAMIN) 500 MCG tablet Take 500 mcg by mouth daily.      No current facility-administered medications for this visit.    Review of Systems  Constitutional:  Constitutional negative. HENT: HENT negative.  Eyes: Eyes negative.  Respiratory: Respiratory negative.  Cardiovascular: Cardiovascular negative.  GI: Gastrointestinal negative.  Musculoskeletal: Musculoskeletal negative.  Skin: Skin negative.  Neurological: Neurological negative. Hematologic: Hematologic/lymphatic negative.  Psychiatric: Psychiatric negative.        Objective:  Objective   Vitals:   05/01/23 1113  BP: (!) 145/75  Pulse: (!) 56  Temp: 98 F (36.7 C)  TempSrc: Temporal  SpO2: 96%  Weight: 143 lb 11.2 oz (65.2 kg)  Height: 5\' 3"  (1.6 m)   Body mass index is 25.46 kg/m.  Physical Exam HENT:     Head: Normocephalic.     Mouth/Throat:     Mouth: Mucous membranes are moist.  Eyes:     Pupils: Pupils are equal, round, and reactive to light.  Cardiovascular:     Rate and Rhythm: Normal rate.     Pulses:          Popliteal pulses are 3+ on the right side and 3+ on the left side.       Dorsalis pedis pulses are 2+ on the right side and 2+ on the left side.       Posterior tibial pulses are 2+ on the right side and 2+ on the left side.  Pulmonary:     Effort: Pulmonary effort is normal.  Abdominal:     General: Abdomen is flat.      Palpations: Abdomen is soft. There is no mass.  Musculoskeletal:        General: Normal range of motion.     Right lower leg: No edema.     Left lower leg: No edema.  Skin:    General: Skin is warm.  Neurological:     General: No focal deficit present.     Mental Status: She is alert.  Psychiatric:        Mood and Affect: Mood normal.        Thought Content: Thought content normal.        Judgment: Judgment normal.     Data: No new studies     Assessment/Plan:    77 year old female with concern for type Ib endoleak from recent EVAR.  Plan will be for CT angio and follow-up to discuss results.  Thankfully aortic aneurysm diameter has not changed despite concern for endoleak.  We discussed the signs and symptoms of aortic rupture and she demonstrates good understanding.   Maeola Harman MD [facility]

## 2023-05-03 ENCOUNTER — Other Ambulatory Visit: Payer: Self-pay

## 2023-05-03 DIAGNOSIS — Z9889 Other specified postprocedural states: Secondary | ICD-10-CM

## 2023-05-03 DIAGNOSIS — Z91041 Radiographic dye allergy status: Secondary | ICD-10-CM

## 2023-05-03 MED ORDER — PREDNISONE 50 MG PO TABS: ORAL_TABLET | ORAL | 0 refills | Status: DC

## 2023-05-03 MED ORDER — DIPHENHYDRAMINE HCL 50 MG PO CAPS
ORAL_CAPSULE | ORAL | 0 refills | Status: DC
Start: 2023-05-03 — End: 2023-05-31

## 2023-05-07 ENCOUNTER — Other Ambulatory Visit: Payer: Self-pay | Admitting: Cardiology

## 2023-05-07 ENCOUNTER — Other Ambulatory Visit: Payer: Self-pay | Admitting: Internal Medicine

## 2023-05-07 ENCOUNTER — Other Ambulatory Visit: Payer: Self-pay | Admitting: Family Medicine

## 2023-05-07 DIAGNOSIS — N390 Urinary tract infection, site not specified: Secondary | ICD-10-CM

## 2023-05-07 NOTE — Telephone Encounter (Signed)
This is a [place] patient.

## 2023-05-09 NOTE — Telephone Encounter (Signed)
Received request for keflex - plz call is she having new UTI symptoms?  When is urology appt scheduled for?

## 2023-05-09 NOTE — Telephone Encounter (Addendum)
Spoke with pt asking if she's still having UTI sxs since we received abx refill request. Pt states sxs eased off but still C/o pain when urinating and vaginal pain. Says uro appt is 05/14/23.

## 2023-05-10 ENCOUNTER — Other Ambulatory Visit: Payer: Medicare Other

## 2023-05-10 DIAGNOSIS — N390 Urinary tract infection, site not specified: Secondary | ICD-10-CM

## 2023-05-10 NOTE — Addendum Note (Signed)
Addended by: Eustaquio Boyden on: 05/10/2023 07:20 AM   Modules accepted: Orders

## 2023-05-10 NOTE — Telephone Encounter (Addendum)
Glad she has urology appt for Tuesday. Can she come in for repeat UCx? I've ordered.

## 2023-05-10 NOTE — Telephone Encounter (Signed)
Spoke with pt's husband, [name] (on dpr) relaying Dr Timoteo Expose message. He verbalizes understanding, will inform pt and scheduled lab visit today at 12:15.

## 2023-05-12 LAB — URINE CULTURE
MICRO NUMBER:: 15644497
SPECIMEN QUALITY:: ADEQUATE

## 2023-05-13 ENCOUNTER — Telehealth: Payer: Self-pay | Admitting: Family Medicine

## 2023-05-13 MED ORDER — AMOXICILLIN 875 MG PO TABS: 875.0000 mg | ORAL_TABLET | Freq: Two times a day (BID) | ORAL | 0 refills | Status: AC

## 2023-05-13 NOTE — Telephone Encounter (Addendum)
Spoke with pt - recurrent E coli UTI,3rd one in last few months - will Rx amoxicillin antibiotic 10d course sent to pharmacy.  They still don't have [facility] appt. Referral placed 04/08/2023. Will ask referral team to check on this. Pt needs urology evaluation for recurrent UTIs.

## 2023-05-13 NOTE — Telephone Encounter (Signed)
Rtn pt's call and spoke with pt's husband, [name] (on dpr). Says pt was calling for results of ucx. I informed him, just like with any results, as soon as the provider receives/reviews them, then we call the pt. He verbalizes understands and will notify pt.   Also, states they received a notice in the mail that CT will be covered, so they called and scheduled that.

## 2023-05-13 NOTE — Addendum Note (Signed)
Addended by: Eustaquio Boyden on: 05/13/2023 05:36 PM   Modules accepted: Orders

## 2023-05-13 NOTE — Telephone Encounter (Signed)
Patient called in and stated that she needs to speak with Misty Stanley in regards to some results. She stated that she has to fill out a form.

## 2023-05-14 ENCOUNTER — Encounter: Payer: Self-pay | Admitting: Neurology

## 2023-05-14 ENCOUNTER — Ambulatory Visit (INDEPENDENT_AMBULATORY_CARE_PROVIDER_SITE_OTHER): Payer: Medicare Other | Admitting: Neurology

## 2023-05-14 VITALS — BP 156/70 | HR 48 | Ht 64.0 in | Wt 147.0 lb

## 2023-05-14 DIAGNOSIS — R351 Nocturia: Secondary | ICD-10-CM | POA: Diagnosis not present

## 2023-05-14 DIAGNOSIS — Z9189 Other specified personal risk factors, not elsewhere classified: Secondary | ICD-10-CM

## 2023-05-14 DIAGNOSIS — F01A0 Vascular dementia, mild, without behavioral disturbance, psychotic disturbance, mood disturbance, and anxiety: Secondary | ICD-10-CM

## 2023-05-14 DIAGNOSIS — R413 Other amnesia: Secondary | ICD-10-CM | POA: Diagnosis not present

## 2023-05-14 DIAGNOSIS — Z79899 Other long term (current) drug therapy: Secondary | ICD-10-CM | POA: Diagnosis not present

## 2023-05-14 NOTE — Progress Notes (Signed)
Subjective:    Patient ID: Ann White is a 76 y.o. female.  HPI    Huston Foley, MD, PhD [facility] [address] [address] [place], [place] [address]  Dear Dr. Sharen Hones,  I saw your patient, Ann White, upon your kind request in my neurologic clinic today for evaluation of her memory loss.  The patient is accompanied by her husband and her daughter (healthcare POA) today.  As you know, Ann White is a 77 year old female with an underlying complex medical history of AAA, nonischemic cardiomyopathy, bradycardia, paroxysmal atrial fibrillation, PACs, carotid artery stenosis, DVT, hypertension, hyperlipidemia, reflux disease, irritable bowel syndrome, seasonal allergies, arthritis (both knees with s/p inj), chronic kidney disease, anxiety, and depression, who reports memory issues for the past 6 months.  Her husband has noticed forgetfulness, she has a tendency to misplace things or forget why she goes into a certain room such as kitchen area.  Her daughter reports that she has noticed her mom's memory loss for the past 6 to 8 months.  Patient still drives.  She had an eye exam 3 months ago and had new eyeglasses, they are monitoring her cataracts, she has a follow-up with her eye doctor in December.  She sees cardiology on a regular basis.  She has not had any confusion or hallucinations or delusions.  She has not had any major behavioral changes or escalation of anxiety or depression.  She reports sleeping fairly well, her husband has noticed some snoring occasionally.  She has nocturia about 3 times per average night, denies recurrent nocturnal or morning headaches.  Her Epworth sleep score is 3 out of 24, fatigue severity score is 34 out of 63.    She is not aware of a family history of memory loss with the exception of her younger sister having some type of memory loss, her dad lived to be in his 91s and had Parkinson's disease, mom lived to be in her 9s.   Patient quit smoking some 40 years ago.  She does not drink any alcohol.  She does not have caffeine daily.  She drinks water about 2 to 3 cups/day and 2 bottles of flavored water.  These are 12 oz size.  I reviewed your office note from 02/05/2023.    She lives with her husband of 58 years.  Her husband is mobility impaired and oxygen dependent and she has to do a lot of things around the house and for him as well.  She has 2 grown children, son is 60 and her daughter is her younger child, she has POA for healthcare.  Both kids live close by.   She was tried on low-dose memantine but stopped it about a month later as she had side effects including jitteriness, feeling wobbly, off balance.  Of note, she takes clonazepam at night for sleep, currently half a pill.  She also takes gabapentin 300 mg at night, she is not quite sure why she takes it.  She had extensive blood work at the time and I reviewed the results: ESR was normal at 27, CRP was less than 1, creatinine was elevated at 1.3, BUN elevated at 33, CK level was normal at 74.  Prior to that she had blood work in April 2024 which included RPR which was nonreactive, TSH was normal at 2.77, vitamin B12 on the lower end of normal at 386, CBC with differential and platelets showed below normal hemoglobin at 10.2, hematocrit below normal at  29.7, creatinine was elevated at 1.36, BUN elevated at 28 at the time.she has been on oral vitamin B12 supplementation. She has had a recurrent UTI.  She has been seen by urology.  She had a brain MRI without contrast on 04/12/2023 and I reviewed the results:   IMPRESSION: 1. No evidence of an acute intracranial abnormality. 2. Chronic small vessel ischemic changes which are moderate in the cerebral white matter, and mild in the pons. Findings are similar to the prior brain MRI of 06/01/2022. 3. Mild-to-moderate generalized cerebral atrophy. 4. Mild right maxillary sinus mucosal thickening.     She had a brain  MRI without contrast on 06/01/2022 and I reviewed the results: Impression: No evidence of acute intracranial abnormality.  Her Past Medical History Is Significant For: Past Medical History:  Diagnosis Date   AAA (abdominal aortic aneurysm) (HCC)    Allergy    Anxiety    Arthritis    Carotid stenosis    a. 02/2016 Carotid U/S: Bilateral 40-59%; b. 03/2017 Carotid U/S: RICA 40-59%, LICA 1-39%; c. 04/2019 Carotid U/S: bilat 40-59% ICA dzs.   Cervical spondylosis    s/p spine injections   CKD (chronic kidney disease), stage III (HCC)    Depression    DVT (deep venous thrombosis) (HCC)    RLE DVT 05/27/15   Dysrhythmia    afib   Ectatic abdominal aorta (HCC) 2016   rpt [place] 5 yrs   GERD (gastroesophageal reflux disease)    History of chicken pox    History of diverticulitis of colon    HLD (hyperlipidemia)    HTN (hypertension)    Irritable bowel syndrome with constipation    Lichen sclerosus et atrophicus    NICM (nonischemic cardiomyopathy) (HCC)    a. 04/2017 Echo: EF 35-40%, diff HK; b. 05/2017 MV: EF 56%, no ischemia/infarct; c. 04/2018 Echo: EF 55-60%, no rwma, Gr2 DD; d. 03/2020 Echo: EF 40-45%, nl PASP, mod dil LA, mild to mod dil RA, Triv MR.   Obesity    Persistent atrial fibrillation (HCC)    a. Dx 03/2020. CHA2DS2VASc = 5-->xarelto 15mg  daily.   Premature atrial contraction    a. 04/2017 48hr Holter: Predominant rhythm - sinus. Rare PVC's, freq PAC's with freq atrial runs up to 34 beats-->metoprolol started.   Seasonal allergic rhinitis     Her Past Surgical History Is Significant For: Past Surgical History:  Procedure Laterality Date   ABDOMINAL AORTIC ANEURYSM REPAIR  09/2021   [facility]   ABDOMINAL AORTIC ANEURYSM REPAIR     ABDOMINAL AORTIC ENDOVASCULAR STENT GRAFT  10/03/2021   Procedure: ABDOMINAL AORTIC ENDOVASCULAR STENT GRAFT;  Surgeon: Maeola Harman, MD;  Location: [facility] OR;  Service: Vascular;;   ABDOMINAL HYSTERECTOMY     bladder tack  1990s   BREAST  BIOPSY Right 03/05/2012    benign   CARDIOVERSION N/A 05/17/2020   Procedure: CARDIOVERSION;  Surgeon: Yvonne Kendall, MD;  Location: [facility] ORS;  Service: Cardiovascular;  Laterality: N/A;   CARPAL TUNNEL RELEASE  2004, 2013   bilateral   CHOLECYSTECTOMY  1990s   COLONOSCOPY  2002   COLONOSCOPY  06/2013   mod diverticulosis, 3 tubular adenomas, int hem rpt 5 yrs Russella Dar)   COLONOSCOPY  06/2018   TA,c olonic angiodysplastic lesion, mod diverticulosis, rpt 5 yrs Russella Dar)   DEXA  07/2011   normal, T score -0.3   ESOPHAGOGASTRODUODENOSCOPY ENDOSCOPY  2004   normal ([facility])   TOTAL ABDOMINAL HYSTERECTOMY W/ BILATERAL SALPINGOOPHORECTOMY  1990s  complete, dysmenorrhea and fibroids   ULTRASOUND GUIDANCE FOR VASCULAR ACCESS  10/03/2021   Procedure: ULTRASOUND GUIDANCE FOR VASCULAR ACCESS;  Surgeon: Maeola Harman, MD;  Location: [facility] OR;  Service: Vascular;;    Her Family History Is Significant For: Family History  Problem Relation Age of Onset   CAD Mother 24       MI   Hypertension Mother    Parkinson's disease Father    Diabetes Father    CAD Father 17       MI   Parkinson's disease Sister    Hyperlipidemia Sister    Memory loss Paternal Aunt    Cancer Neg Hx    Stroke Neg Hx    Colon cancer Neg Hx    Breast cancer Neg Hx    Colon polyps Neg Hx    Esophageal cancer Neg Hx    Stomach cancer Neg Hx    Rectal cancer Neg Hx     Her Social History Is Significant For: Social History   Socioeconomic History   Marital status: Married    Spouse name: Freddie   Number of children: 2   Years of education: Not on file   Highest education level: Not on file  Occupational History   Occupation: retired   Tobacco Use   Smoking status: Former    Current packs/day: 0.00    Average packs/day: 0.3 packs/day for 3.0 years (0.8 ttl pk-yrs)    Types: Cigarettes    Start date: 60    Quit date: 1990    Years since quitting: 34.8   Smokeless tobacco: Never   Tobacco  comments:    minimal smoking history  Vaping Use   Vaping status: Never Used  Substance and Sexual Activity   Alcohol use: No    Alcohol/week: 0.0 standard drinks of alcohol   Drug use: No   Sexual activity: Not Currently  Other Topics Concern   Not on file  Social History Narrative   Lives with husband and 2 cats.  2 grown children, 5 grandchildren.   Occupation: retired, worked in [employer]   Activity: no regular exercise.  Does walk in summer   Diet: fruits/vegetables daily, good water.   Social Determinants of Health   Financial Resource Strain: Low Risk  (07/13/2022)   Overall Financial Resource Strain (CARDIA)    Difficulty of Paying Living Expenses: Not very hard  Food Insecurity: No Food Insecurity (07/13/2022)   Hunger Vital Sign    Worried About Running Out of Food in the Last Year: Never true    Ran Out of Food in the Last Year: Never true  Transportation Needs: No Transportation Needs (07/13/2022)   PRAPARE - Administrator, Civil Service (Medical): No    Lack of Transportation (Non-Medical): No  Physical Activity: Inactive (07/12/2021)   Exercise Vital Sign    Days of Exercise per Week: 0 days    Minutes of Exercise per Session: 0 min  Stress: No Stress Concern Present (07/13/2022)   [employer] of Occupational Health - Occupational Stress Questionnaire    Feeling of Stress : Not at all  Social Connections: Moderately Integrated (07/13/2022)   Social Connection and Isolation Panel [NHANES]    Frequency of Communication with Friends and Family: More than three times a week    Frequency of Social Gatherings with Friends and Family: More than three times a week    Attends Religious Services: More than 4 times per year    Active Member  of Clubs or Organizations: No    Attends Banker Meetings: Never    Marital Status: Married    Her Allergies Are:  Allergies  Allergen Reactions   Sulfa Antibiotics Swelling    Oral swelling   Nexletol  [Bempedoic Acid]     Dizziness, "eyes felt funny"   Tricor [Fenofibrate] Other (See Comments)    myalgias   Zetia [Ezetimibe] Other (See Comments)    Dizziness   Contrast Media [Iodinated Contrast Media] Other (See Comments)    Oral contrast caused mouth blisters   Lipitor [Atorvastatin] Other (See Comments)    myalgias   Pravastatin Other (See Comments)    myalgias  :   Her Current Medications Are:  Outpatient Encounter Medications as of 05/14/2023  Medication Sig   acetaminophen (TYLENOL) 500 MG tablet Take 500-1,000 mg by mouth every 6 (six) hours as needed (knee pain.).   amoxicillin (AMOXIL) 875 MG tablet Take 1 tablet (875 mg total) by mouth 2 (two) times daily for 10 days.   apixaban (ELIQUIS) 5 MG TABS tablet Take 1 tablet by mouth twice daily   cetirizine (ZYRTEC) 10 MG tablet Take 10 mg by mouth daily as needed for allergies.   Cholecalciferol (VITAMIN D3) 1000 UNITS CAPS Take 1 capsule (1,000 Units total) by mouth daily.   clonazePAM (KLONOPIN) 0.5 MG tablet Take 1 tablet (0.5 mg total) by mouth at bedtime as needed for anxiety (sleep).   diphenhydrAMINE (BENADRYL) 50 MG capsule Take one capsule 1 hour prior to scan.   dofetilide (TIKOSYN) 125 MCG capsule TAKE 1 CAPSULE BY MOUTH TWICE A DAY   Evolocumab (REPATHA SURECLICK) 140 MG/ML SOAJ Inject 140 mg into the skin every 14 (fourteen) days.   famotidine (PEPCID) 20 MG tablet TAKE 1 TABLET BY MOUTH AT BEDTIME   gabapentin (NEURONTIN) 300 MG capsule Take 1 capsule (300 mg total) by mouth at bedtime.   losartan (COZAAR) 100 MG tablet Take 1 tablet by mouth once daily   Multiple Vitamin (MULTIVITAMIN WITH MINERALS) TABS tablet Take 1 tablet by mouth daily.   oxymetazoline (AFRIN) 0.05 % nasal spray Place 1 spray into both nostrils 2 (two) times daily as needed for congestion.   pantoprazole (PROTONIX) 40 MG tablet Take 1 tablet (40 mg total) by mouth 2 (two) times daily.   predniSONE (DELTASONE) 50 MG tablet Take one tablet 13  hours, 7 hours, and 1 hour prior to scan.   rosuvastatin (CRESTOR) 5 MG tablet TAKE 1 TABLET BY MOUTH THREE TIMES A WEEK   sertraline (ZOLOFT) 50 MG tablet Take 1 tablet (50 mg total) by mouth daily.   traMADol (ULTRAM) 50 MG tablet TAKE 1 TABLET BY MOUTH TWICE DAILY AS NEEDED FOR MODERATE PAIN   vitamin B-12 (CYANOCOBALAMIN) 500 MCG tablet Take 500 mcg by mouth daily.    No facility-administered encounter medications on file as of 05/14/2023.  :   Review of Systems:  Out of a complete 14 point review of systems, all are reviewed and negative with the exception of these symptoms as listed below:  Review of Systems  Neurological:        Pt here for memory loss.  Noticed 6-8 months ago. Progressively getting worse. Mmse 21/30. She tried one medication not sure of name, but could not tolerate (dizzy/offblalance).   Noted memantine in discontinued meds.    Objective:  Neurological Exam  Physical Exam Physical Examination:   Vitals:   05/14/23 1453  BP: (!) 155/65  Pulse: (!) 50  General Examination: The patient is a very pleasant 77 y.o. female in no acute distress. She appears well-developed and well-nourished and well groomed.   HEENT: Normocephalic, atraumatic, pupils are equal, round and reactive to light, extraocular tracking is mildly impaired, no nystagmus.  She has bilateral cataracts.  Neck is supple, hearing grossly intact, no carotid bruits.  Face is symmetric with normal facial animation, speech is clear without dysarthria, hypophonia or voice tremor.  Airway examination reveals moderate mouth dryness.  Tongue protrudes centrally and palate elevates symmetrically.  Small airway entry noted, Mallampati class II, tonsils small or ?absent. Neck circumference 13-1/2 inches.  Chest: Clear to auscultation without wheezing, rhonchi or crackles noted.  Heart: S1+S2+0, regular, bradycardia noted.   Abdomen: Soft, non-tender and non-distended.  Extremities: There is trace  pitting edema in the right ankle area.     Skin: Warm and dry without trophic changes noted.   Musculoskeletal: exam reveals bilateral knee swelling, right more than left.    Neurologically:  Mental status: The patient is awake, alert and oriented in all 4 spheres. Her immediate and remote memory, attention, language skills and fund of knowledge are mildly impaired.       05/14/2023    3:00 PM 03/25/2020   10:13 AM 03/06/2018    9:45 AM 02/26/2017    1:48 PM 02/21/2016    8:49 AM  MMSE - Mini Mental State Exam  Orientation to time 4 5 5 5 5   Orientation to Place 4 5 5 5 5   Registration 3 3 3 3 3   Attention/ Calculation 1 5 0 0 0  Recall 0 3 3 3 3   Language- name 2 objects 2  0 0 0  Language- repeat 1 1 1 1 1   Language- follow 3 step command 3  3 3 3   Language- read & follow direction 1  0 0 0  Write a sentence 1  0 0 0  Copy design 1  0 0 0  Total score 21  20 20 20     On 05/14/2023: CDT: 4/4, AFT: 6/min.   There is no evidence of aphasia, agnosia, apraxia or anomia.  She is not very elaborate with her history, history is heavily supplemented by her husband and also by her daughter to some degree.   Speech is clear with normal prosody and enunciation. Thought process is linear. Mood is normal and affect is normal.  Cranial nerves II - XII are as described above under HEENT exam.  Motor exam: Normal bulk, strength and tone is noted. There is no obvious action or resting tremor.  Fine motor skills and coordination: grossly intact.  Cerebellar testing: No dysmetria or intention tremor. There is no truncal or gait ataxia.  Sensory exam: intact to light touch in the upper and lower extremities.  Romberg is not tested for safety concerns.  Reflexes are 1+ in the upper extremities and absent in the lower extremities. Gait, station and balance: She stands up slowly, requires no assistance, she has mild increase in lumbar kyphosis, walks slowly and cautiously, no walking aids, no  shuffling, has preserved arm swing.   Assessment and Plan:  In summary, Ann White is a very pleasant 77 year old female with an underlying complex medical history of AAA, nonischemic cardiomyopathy, bradycardia, paroxysmal atrial fibrillation, PACs, carotid artery stenosis, DVT, hypertension, hyperlipidemia, reflux disease, irritable bowel syndrome, seasonal allergies, arthritis (both knees with s/p inj), chronic kidney disease, anxiety, and depression, who presents for evaluation of her memory loss of  about 6 to 8 months duration.  She has vascular risk factors and may have mild vascular dementia.  Her MMSE is 21 out of 30 today, she has had previous values in the same range, even a few years back.  We talked about vascular dementia at length.  This was an extended visit of over 60 minutes with extended counseling and coordination of care, explaining test results including blood test results and MRI results from the recent past.  Unfortunately, she had side effects with memantine and I would not favor donepezil in her case because of her bradycardia.  She may risk factors for sleep apnea, given that she has paroxysmal A-fib and vascular risk factors I would like to proceed with a sleep test, we mutually agreed to proceed with a home sleep test at this time.  I explained the home sleep test procedure to the patient and her family.  If she has obstructive sleep apnea, we will consider AutoPap therapy.  We talked about the importance of maintaining a healthy lifestyle, good nutrition, good hydration with water, physical activity.  We will plan a follow-up after her home sleep test.  She is encouraged to talk to you about potentially tapering completely off of clonazepam and gabapentin as these medications can cause cognitive slowness and affect balance adversely potentially.  She is cautioned about her driving, her family is encouraged to monitor it. She is advised to follow-up with her eye doctor as  scheduled, she does have bilateral cataracts upon my exam.   I answered all the questions today and the patient and her family were in agreement with our plan.  Thank you very much for allowing me to participate in the care of this nice patient. If I can be of any further assistance to you please do not hesitate to call me at [phone].  Sincerely,   Huston Foley, MD, PhD

## 2023-05-14 NOTE — Patient Instructions (Signed)
It was nice to meet you today.  You have complaints of memory loss: memory loss or changes in cognitive function can have many reasons and does not always mean you have dementia.  There are several conditions and situations that can contribute to subjective or objective memory loss.  These factors include: depression, stress, sleep deprivation or poor sleep from insomnia or sleep apnea, dehydration, fluctuation in blood sugar values, thyroid or electrolyte dysfunction, medication effects from sedating medications or narcotic pain medication for example and certain vitamin deficiencies such as vitamin B12 deficiency, and anemia. Dementia can be caused by stroke, brain atherosclerosis or brain vascular disease due to vascular risk factors (smoking, high blood pressure, high cholesterol, obesity and uncontrolled diabetes), certain degenerative brain disorders (including Parkinson's disease and Multiple sclerosis) and by Alzheimer's disease or other, more rare and sometimes hereditary causes.   Here are today's discussion points and my recommendations:    I believe you are at risk of vascular dementia.  Unfortunately, you had side effects with memantine in low-dose, I would recommend not restarting it at this time, you are not a good candidate for medication called donepezil and related medications because of your lower heart rate, we call this bradycardia.   You had recent blood work, I recommend a follow-up with your primary care to monitor your vitamin B12 level.    You had a recent brain MRI.   I recommend we proceed with a home sleep test to look into the possibility of underlying obstructive sleep apnea.  If you have obstructive sleep apnea I will likely recommend treatment with a CPAP or AutoPap machine. We will plan to follow-up after your home sleep test. Please try to hydrate well with water, I recommend 6 to 8 cups of water per day, 8 ounce size each.  Limit caffeine as you are, avoid alcohol as you  are. Please talk to your primary care about coming off of clonazepam and gabapentin altogether if possible.  These medications can affect your cognitive sharpness and affect your balance adversely.

## 2023-05-16 ENCOUNTER — Encounter: Payer: Self-pay | Admitting: Gastroenterology

## 2023-05-16 DIAGNOSIS — R350 Frequency of micturition: Secondary | ICD-10-CM | POA: Diagnosis not present

## 2023-05-21 ENCOUNTER — Encounter: Payer: Self-pay | Admitting: Gastroenterology

## 2023-05-24 ENCOUNTER — Ambulatory Visit
Admission: RE | Admit: 2023-05-24 | Discharge: 2023-05-24 | Disposition: A | Discharge status: Home / Self Care | Payer: Medicare Other | Source: Ambulatory Visit | Attending: Vascular Surgery

## 2023-05-24 DIAGNOSIS — Z9889 Other specified postprocedural states: Secondary | ICD-10-CM | POA: Insufficient documentation

## 2023-05-24 HISTORY — DX: Disorder of kidney and ureter, unspecified: N28.9

## 2023-05-24 LAB — POCT I-STAT CREATININE: Creatinine, Ser: 2.2 mg/dL — ABNORMAL HIGH (ref 0.44–1.00)

## 2023-05-24 MED ORDER — DIPHENHYDRAMINE HCL 25 MG PO CAPS
ORAL_CAPSULE | ORAL | Status: AC
  Filled 2023-05-24: qty 2

## 2023-05-24 MED ORDER — DIPHENHYDRAMINE HCL 25 MG PO CAPS
50.0000 mg | ORAL_CAPSULE | Freq: Once | ORAL | Status: AC
  Administered 2023-05-24: 50 mg via ORAL

## 2023-05-26 NOTE — Progress Notes (Deleted)
Cardiology Office Note:  .   Date:  05/26/2023  ID:  Ann White, DOB 06-23-1946, MRN [id number] PCP: Eustaquio Boyden, MD   [facility]Care Providers Cardiologist:  Yvonne Kendall, MD Electrophysiologist:  Lanier Prude, MD { Click to update primary MD,subspecialty MD or APP then REFRESH:1}    History of Present Illness: Ann White   Ann White is a 77 y.o. female with history of nonischemic cardiomyopathy diagnosed in 04/2017 (MPI without perfusion defects LVEF as low as 35-40% but normalized on repeat echo in 04/2018), persistent atrial fibrillation diagnosed in 03/2020 on dofetilide, carotid artery stenosis, abdominal aortic aneurysm status post EVAR, right calf DVT, hypertension, hyperlipidemia, and chronic kidney disease, who presents for follow-up of cardiomyopathy and atrial fibrillation.  I last saw her a year ago, at which time she was feeling well other than sporadic occipital headaches.  We did not make any medication changes or pursue additional testing beyond routine labs.  ROS: See HPI  Studies Reviewed: .        CV Surgery: EVAR (10/03/2021, Dr. Randie Heinz)   EP Procedures and Devices: DCCV (05/17/20) 48 hour Holter monitor (04/29/17): Sinus rhythm with frequent PACs and atrial runs, lasting up to 34 beats.   Non-Invasive Evaluation(s): ABIs (08/30/2021): Normal bilateral ABIs/TBI's. Carotid Doppler (08/15/2021): Moderate bilateral ICA stenoses of 40-59%.  Greater than 50% stenoses in both ECAs.  Antegrade vertebral artery flow.  Disturbed flow in both subclavian arteries. AAA duplex (08/15/2021): Abnormal dilation of the mid and distal abdominal aorta measuring up to 5.1 cm. TTE (09/20/2020): Normal LV size wall thickness.  LVEF 60-65% with normal wall motion.  Grade 1 diastolic dysfunction.  Normal RV size and function.  Severe left and mild to moderate right atrial enlargement.  Trivial mitral and mild tricuspid regurgitation. Carotid Doppler (08/15/2020): 40-59%  stenoses in both carotid arteries.  Greater than 50% stenosis of right external carotid artery.  Antegrade vertebral artery flow. TTE (04/14/2020): Normal LV size and wall thickness.  LVEF 40-45% with global hypokinesis.  Normal RV size and function.  Moderate left atrial and mild to moderate right atrial enlargement.  Trivial mitral and mild tricuspid regurgitation. Carotid Doppler (04/17/2019): 40-59% stenosis in the carotid arteries bilaterally.  Antegrade flow in the vertebral arteries.  Normal subclavian flow. TTE (04/24/18): Normal LV size.  LVEF 55-60% with normal wall motion.  Grade 2 diastolic dysfunction.  Mild left atrial enlargement.  Normal RV size and function.  Mild pulmonary hypertension (PASP 41 mmHg). Exercise MPI (06/12/17): No evidence of ischemia. LVEF 56%. Hypertensive blood pressure response to exercise. Echo (04/29/17): Normal LV size with moderately reduced contraction. LVEF 35-40% with global hypokinesis. Mild MR. Mild left atrial enlargement. Normal RV size and function. Normal PA pressure. Carotid Doppler (03/25/17): Heterogeneous plaque, bilaterally. Stable, 40-59% RICA stenosis. Stable,1-39% LICA stenosis. Stable, >50% RECA stenosis. Patent vertebral arteries with antegrade flow. Normal subclavian arteries, bilaterally.  Risk Assessment/Calculations:   {Does this patient have ATRIAL FIBRILLATION?:[phone]} No BP recorded.  {Refresh Note OR Click here to enter BP  :1}***       Physical Exam:   VS:  There were no vitals taken for this visit.   Wt Readings from Last 3 Encounters:  05/14/23 147 lb (66.7 kg)  05/01/23 143 lb 11.2 oz (65.2 kg)  04/15/23 147 lb 6 oz (66.8 kg)    General:  NAD. Neck: No JVD or HJR. Lungs: Clear to auscultation bilaterally without wheezes or crackles. Heart: Regular rate and rhythm without murmurs, rubs, or  gallops. Abdomen: Soft, nontender, nondistended. Extremities: No lower extremity edema.  ASSESSMENT AND PLAN: .    ***    {Are  you ordering a CV Procedure (e.g. stress test, cath, DCCV, TEE, etc)?   Press F2        :[id number]}  Dispo: ***  Signed, Yvonne Kendall, MD

## 2023-05-27 ENCOUNTER — Ambulatory Visit: Payer: Medicare Other | Admitting: Internal Medicine

## 2023-05-27 ENCOUNTER — Encounter: Payer: Self-pay | Admitting: Medical

## 2023-05-27 ENCOUNTER — Ambulatory Visit: Payer: Medicare Other | Attending: Medical | Admitting: Medical

## 2023-05-27 VITALS — BP 138/68 | HR 67 | Ht 64.0 in | Wt 138.2 lb

## 2023-05-27 DIAGNOSIS — N179 Acute kidney failure, unspecified: Secondary | ICD-10-CM | POA: Diagnosis not present

## 2023-05-27 DIAGNOSIS — K529 Noninfective gastroenteritis and colitis, unspecified: Secondary | ICD-10-CM

## 2023-05-27 DIAGNOSIS — I428 Other cardiomyopathies: Secondary | ICD-10-CM

## 2023-05-27 DIAGNOSIS — I10 Essential (primary) hypertension: Secondary | ICD-10-CM | POA: Diagnosis not present

## 2023-05-27 DIAGNOSIS — Z79899 Other long term (current) drug therapy: Secondary | ICD-10-CM | POA: Diagnosis not present

## 2023-05-27 DIAGNOSIS — E782 Mixed hyperlipidemia: Secondary | ICD-10-CM

## 2023-05-27 DIAGNOSIS — I5022 Chronic systolic (congestive) heart failure: Secondary | ICD-10-CM | POA: Diagnosis not present

## 2023-05-27 DIAGNOSIS — I4819 Other persistent atrial fibrillation: Secondary | ICD-10-CM

## 2023-05-27 NOTE — Progress Notes (Unsigned)
Cardiology Office Note:    Date:  05/27/2023   ID:  Ann White, DOB [birth year], MRN [id number]  PCP:  Eustaquio Boyden, MD  [facility] Cardiologist:  Yvonne Kendall, MD  [facility] Electrophysiologist:  Lanier Prude, MD   Referring MD: Eustaquio Boyden, MD   Chief Complaint: 1 year follow-up  History of Present Illness:    Ann White is a 77 y.o. female with a hx of mild cognitive impairment, NICM diagnosed in 04/2018 (MPI without perfusion defects LVEF as low as 35-40% but normalized on repeat echo 1-/2019), persistent atrial fibrillation diagnosed in 03/2020 on dofetilide, carotid artery stenosis, right calf DVT, HTN, HLD, AAA s/p EVAR and CKD who presents for follow-up.   The patient was last seen 05/2022 and was overall feeling well from a cardiac perspective.   Today, the patient reports she has been having GI issues for the last 3 days. She was having diarrhea, leg weakness and hallucinating. She was cold, but had no fevers.  She didn't eat anything abnormal. She lost weight from not eating. When she has UTIs she has some confusion.   She denies chest pain, SOB, lower leg edema, orthpnea or pnd, lightheadedness or dizziness, heart racing. Kidney function on 11/8 showed Scr 2.2, which is up form 3 months ago 1.32. BP is mildly elevated.   Past Medical History:  Diagnosis Date   AAA (abdominal aortic aneurysm) (HCC)    Allergy    Anxiety    Arthritis    Carotid stenosis    a. 02/2016 Carotid U/S: Bilateral 40-59%; b. 03/2017 Carotid U/S: RICA 40-59%, LICA 1-39%; c. 04/2019 Carotid U/S: bilat 40-59% ICA dzs.   Cervical spondylosis    s/p spine injections   CKD (chronic kidney disease), stage III (HCC)    Depression    DVT (deep venous thrombosis) (HCC)    RLE DVT 05/27/15   Dysrhythmia    afib   Ectatic abdominal aorta (HCC) 2016   rpt [place] 5 yrs   GERD (gastroesophageal reflux disease)    History of chicken pox    History of diverticulitis of  colon    HLD (hyperlipidemia)    HTN (hypertension)    Irritable bowel syndrome with constipation    Lichen sclerosus et atrophicus    NICM (nonischemic cardiomyopathy) (HCC)    a. 04/2017 Echo: EF 35-40%, diff HK; b. 05/2017 MV: EF 56%, no ischemia/infarct; c. 04/2018 Echo: EF 55-60%, no rwma, Gr2 DD; d. 03/2020 Echo: EF 40-45%, nl PASP, mod dil LA, mild to mod dil RA, Triv MR.   Obesity    Persistent atrial fibrillation (HCC)    a. Dx 03/2020. CHA2DS2VASc = 5-->xarelto 15mg  daily.   Premature atrial contraction    a. 04/2017 48hr Holter: Predominant rhythm - sinus. Rare PVC's, freq PAC's with freq atrial runs up to 34 beats-->metoprolol started.   Renal insufficiency    Seasonal allergic rhinitis     Past Surgical History:  Procedure Laterality Date   ABDOMINAL AORTIC ANEURYSM REPAIR  09/2021   [facility]   ABDOMINAL AORTIC ANEURYSM REPAIR     ABDOMINAL AORTIC ENDOVASCULAR STENT GRAFT  10/03/2021   Procedure: ABDOMINAL AORTIC ENDOVASCULAR STENT GRAFT;  Surgeon: Maeola Harman, MD;  Location: [facility] OR;  Service: Vascular;;   ABDOMINAL HYSTERECTOMY     bladder tack  1990s   BREAST BIOPSY Right 03/05/2012    benign   CARDIOVERSION N/A 05/17/2020   Procedure: CARDIOVERSION;  Surgeon: Yvonne Kendall, MD;  Location: [facility]  ORS;  Service: Cardiovascular;  Laterality: N/A;   CARPAL TUNNEL RELEASE  2004, 2013   bilateral   CHOLECYSTECTOMY  1990s   COLONOSCOPY  2002   COLONOSCOPY  06/2013   mod diverticulosis, 3 tubular adenomas, int hem rpt 5 yrs Russella Dar)   COLONOSCOPY  06/2018   TA,c olonic angiodysplastic lesion, mod diverticulosis, rpt 5 yrs Russella Dar)   DEXA  07/2011   normal, T score -0.3   ESOPHAGOGASTRODUODENOSCOPY ENDOSCOPY  2004   normal ([facility])   TOTAL ABDOMINAL HYSTERECTOMY W/ BILATERAL SALPINGOOPHORECTOMY  1990s   complete, dysmenorrhea and fibroids   ULTRASOUND GUIDANCE FOR VASCULAR ACCESS  10/03/2021   Procedure: ULTRASOUND GUIDANCE FOR VASCULAR ACCESS;  Surgeon: Maeola Harman, MD;  Location[facility] OR;  Service: Vascular;;    Current Medications: Current Meds  Medication Sig   acetaminophen (TYLENOL) 500 MG tablet Take 500-1,000 mg by mouth every 6 (six) hours as needed (knee pain.).   apixaban (ELIQUIS) 5 MG TABS tablet Take 1 tablet by mouth twice daily   cetirizine (ZYRTEC) 10 MG tablet Take 10 mg by mouth daily as needed for allergies.   Cholecalciferol (VITAMIN D3) 1000 UNITS CAPS Take 1 capsule (1,000 Units total) by mouth daily.   clonazePAM (KLONOPIN) 0.5 MG tablet Take 1 tablet (0.5 mg total) by mouth at bedtime as needed for anxiety (sleep).   dofetilide (TIKOSYN) 125 MCG capsule TAKE 1 CAPSULE BY MOUTH TWICE A DAY   Evolocumab (REPATHA SURECLICK) 140 MG/ML SOAJ Inject 140 mg into the skin every 14 (fourteen) days.   famotidine (PEPCID) 20 MG tablet TAKE 1 TABLET BY MOUTH AT BEDTIME   gabapentin (NEURONTIN) 300 MG capsule Take 1 capsule (300 mg total) by mouth at bedtime.   Multiple Vitamin (MULTIVITAMIN WITH MINERALS) TABS tablet Take 1 tablet by mouth daily.   oxymetazoline (AFRIN) 0.05 % nasal spray Place 1 spray into both nostrils 2 (two) times daily as needed for congestion.   pantoprazole (PROTONIX) 40 MG tablet Take 1 tablet (40 mg total) by mouth 2 (two) times daily.   predniSONE (DELTASONE) 50 MG tablet Take one tablet 13 hours, 7 hours, and 1 hour prior to scan.   rosuvastatin (CRESTOR) 5 MG tablet TAKE 1 TABLET BY MOUTH THREE TIMES A WEEK   sertraline (ZOLOFT) 50 MG tablet Take 1 tablet (50 mg total) by mouth daily.   traMADol (ULTRAM) 50 MG tablet TAKE 1 TABLET BY MOUTH TWICE DAILY AS NEEDED FOR MODERATE PAIN   vitamin B-12 (CYANOCOBALAMIN) 500 MCG tablet Take 500 mcg by mouth daily.    [DISCONTINUED] losartan (COZAAR) 100 MG tablet Take 1 tablet by mouth once daily     Allergies:   Sulfa antibiotics, Nexletol [bempedoic acid], Tricor [fenofibrate], Zetia [ezetimibe], Contrast media [iodinated contrast media], Lipitor  [atorvastatin], and Pravastatin   Social History   Socioeconomic History   Marital status: Married    Spouse name: Freddie   Number of children: 2   Years of education: Not on file   Highest education level: Not on file  Occupational History   Occupation: retired   Tobacco Use   Smoking status: Former    Current packs/day: 0.00    Average packs/day: 0.3 packs/day for 3.0 years (0.8 ttl pk-yrs)    Types: Cigarettes    Start date: 57    Quit date: 1990    Years since quitting: 34.8   Smokeless tobacco: Never   Tobacco comments:    minimal smoking history  Vaping Use   Vaping  status: Never Used  Substance and Sexual Activity   Alcohol use: No    Alcohol/week: 0.0 standard drinks of alcohol   Drug use: No   Sexual activity: Not Currently  Other Topics Concern   Not on file  Social History Narrative   Lives with husband and 2 cats.  2 grown children, 5 grandchildren.   Occupation: retired, worked in [employer]   Activity: no regular exercise.  Does walk in summer   Diet: fruits/vegetables daily, good water.   Social Determinants of Health   Financial Resource Strain: Low Risk  (07/13/2022)   Overall Financial Resource Strain (CARDIA)    Difficulty of Paying Living Expenses: Not very hard  Food Insecurity: No Food Insecurity (07/13/2022)   Hunger Vital Sign    Worried About Running Out of Food in the Last Year: Never true    Ran Out of Food in the Last Year: Never true  Transportation Needs: No Transportation Needs (07/13/2022)   PRAPARE - Administrator, Civil Service (Medical): No    Lack of Transportation (Non-Medical): No  Physical Activity: Inactive (07/12/2021)   Exercise Vital Sign    Days of Exercise per Week: 0 days    Minutes of Exercise per Session: 0 min  Stress: No Stress Concern Present (07/13/2022)   [employer] of Occupational Health - Occupational Stress Questionnaire    Feeling of Stress : Not at all  Social Connections: Moderately  Integrated (07/13/2022)   Social Connection and Isolation Panel [NHANES]    Frequency of Communication with Friends and Family: More than three times a week    Frequency of Social Gatherings with Friends and Family: More than three times a week    Attends Religious Services: More than 4 times per year    Active Member of [employer] or Organizations: No    Attends Engineer, structural: Never    Marital Status: Married     Family History: The patient's family history includes CAD (age of onset: 18) in her father; CAD (age of onset: 66) in her mother; Diabetes in her father; Hyperlipidemia in her sister; Hypertension in her mother; Memory loss in her paternal aunt; Parkinson's disease in her father and sister. There is no history of Cancer, Stroke, Colon cancer, Breast cancer, Colon polyps, Esophageal cancer, Stomach cancer, or Rectal cancer.  ROS:   Please see the history of present illness.     All other systems reviewed and are negative.  EKGs/Labs/Other Studies Reviewed:    The following studies were reviewed today:  Echo 2022 1. Left ventricular ejection fraction, by estimation, is 60 to 65%. Left  ventricular ejection fraction by 3D volume is 61 %. The left ventricle has  normal function. The left ventricle has no regional wall motion  abnormalities. Left ventricular diastolic   parameters are consistent with Grade I diastolic dysfunction (impaired  relaxation).   2. Right ventricular systolic function is normal. The right ventricular  size is normal. There is normal pulmonary artery systolic pressure. The  estimated right ventricular systolic pressure is 30.0 mmHg.   3. Left atrial size was severely dilated.   4. Right atrial size was mild to moderately dilated.   5. The mitral valve is grossly normal. Trivial mitral valve  regurgitation. No evidence of mitral stenosis.   6. The aortic valve is tricuspid. Aortic valve regurgitation is not  visualized. No aortic stenosis is  present.   7. The inferior vena cava is normal in size with greater  than 50%  respiratory variability, suggesting right atrial pressure of 3 mmHg.   Comparison(s): Changes from prior study are noted. EF has improved to  60-65%. NSR has replaced Afib.   EKG:  EKG is ordered today.  The ekg ordered today demonstrates NSR 67bpm, nonspecific T wave changes  Recent Labs: 07/18/2022: ALT 7 10/30/2022: TSH 2.77 02/05/2023: BUN 33; Hemoglobin 10.5; Platelets 161.0; Potassium 4.7; Sodium 140 05/24/2023: Creatinine, Ser 2.20  Recent Lipid Panel    Component Value Date/Time   CHOL 112 05/24/2022 1114   CHOL 215 (H) 03/24/2021 1047   TRIG 124 05/24/2022 1114   HDL 50 05/24/2022 1114   HDL 52 03/24/2021 1047   CHOLHDL 2.2 05/24/2022 1114   VLDL 25 05/24/2022 1114   LDLCALC 37 05/24/2022 1114   LDLCALC 132 (H) 03/24/2021 1047   LDLDIRECT 132.0 03/18/2019 0916     Physical Exam:    VS:  BP 138/68 (BP Location: Left Arm, Patient Position: Sitting, Cuff Size: Normal)   Pulse 67   Ht 5\' 4"  (1.626 m)   Wt 138 lb 3.2 oz (62.7 kg)   SpO2 96%   BMI 23.72 kg/m     Wt Readings from Last 3 Encounters:  05/27/23 138 lb 3.2 oz (62.7 kg)  05/14/23 147 lb (66.7 kg)  05/01/23 143 lb 11.2 oz (65.2 kg)     GEN:  Well nourished, well developed in no acute distress HEENT: Normal NECK: No JVD; No carotid bruits LYMPHATICS: No lymphadenopathy CARDIAC: RRR, no murmurs, rubs, gallops RESPIRATORY:  Clear to auscultation without rales, wheezing or rhonchi  ABDOMEN: Soft, non-tender, non-distended MUSCULOSKELETAL:  No edema; No deformity  SKIN: Warm and dry NEUROLOGIC:  Alert and oriented x 3 PSYCHIATRIC:  Normal affect   ASSESSMENT:    1. AKI (acute kidney injury) (HCC)   2. Persistent atrial fibrillation (HCC)   3. Medication management   4. Chronic systolic heart failure (HCC)   5. Nonischemic cardiomyopathy (HCC)   6. Hyperlipidemia, mixed   7. Essential hypertension   8. Gastroenteritis     PLAN:    In order of problems listed above:  AKI Scr on 11/8 was 2.2. Scr 3 months ago 1.32. She also reported diarrhea and poor oral intake over the last 3 days. I will stop Losartan and re-check a BMET today and in 2 weeks.   Persistent Afib EKG shows normal sinus rhythm today.  Patient is on Tikosyn 125 mcg twice daily. QTC is stable. She takes Eliquis 5 mg twice daily for stroke prophylaxis.  I will set patient up to see EP later in the year.  Chronic HFrEF NICM Patient has a history of nonischemic cardiomyopathy with improved EF. He appears euvolemic on exam. Hold Losartan as above for AKI. We will reassess GDMT at follow-up.   HTN Hold Losartan as above. We will reassess BP at follow-up.  HLD LDL 37 in 2023. Continue Repatha. Can update labs at follow-up.   GI issues/hallucinations Suspect hallucinations 2/2 to GI virus/infection. Symptoms have resolved. Recommended good hydration. Recommend she see PCP if symptoms return.   Disposition: Follow up in 1 month(s) with MD/APP    Signed, Marabella Popiel David Stall, PA-C  05/27/2023 2:53 PM    [facility]

## 2023-05-27 NOTE — Patient Instructions (Addendum)
Medication Instructions:  Your physician recommends the following medication changes.  STOP TAKING: Losartan  *If you need a refill on your cardiac medications before your next appointment, please call your pharmacy*   Lab Work: Your provider would like for you to return in 2 weeks to have the following labs drawn: BMET.   Please go to [facility] [address] ([facility] [address], [place] [address] You do not need an appointment.  They are open from 7:30 am-4 pm.  Lunch from 1:00 pm- 2:00 pm You DO NOT need to be fasting.    Follow-Up: At [facility], you and your health needs are our priority.  As part of our continuing mission to provide you with exceptional heart care, we have created designated Provider Care Teams.  These Care Teams include your primary Cardiologist (physician) and Advanced Practice Providers (APPs -  Physician Assistants and Nurse Practitioners) who all work together to provide you with the care you need, when you need it.  We recommend signing up for the patient portal called "MyChart".  Sign up information is provided on this After Visit Summary.  MyChart is used to connect with patients for Virtual Visits (Telemedicine).  Patients are able to view lab/test results, encounter notes, upcoming appointments, etc.  Non-urgent messages can be sent to your provider as well.   To learn more about what you can do with MyChart, go to [email or web address].    Your next appointment:   1 month(s) -- You may see Yvonne Kendall, MD or Cadence Fransico Michael, PA-C   3 months with electrophysiology -- You may see Steffanie Dunn, MD

## 2023-05-27 NOTE — Addendum Note (Signed)
Addended by: Leilani Able, Wilhelmenia Addis A on: 05/27/2023 11:28 AM   Modules accepted: Orders

## 2023-05-29 ENCOUNTER — Ambulatory Visit: Payer: Medicare Other | Admitting: Internal Medicine

## 2023-05-29 ENCOUNTER — Encounter: Payer: Self-pay | Admitting: Internal Medicine

## 2023-05-29 VITALS — BP 130/84 | HR 68 | Temp 97.8°F | Ht 64.0 in | Wt 136.0 lb

## 2023-05-29 DIAGNOSIS — R1084 Generalized abdominal pain: Secondary | ICD-10-CM | POA: Diagnosis not present

## 2023-05-29 DIAGNOSIS — R1032 Left lower quadrant pain: Secondary | ICD-10-CM | POA: Insufficient documentation

## 2023-05-29 LAB — CBC
HCT: 38.1 % (ref 36.0–46.0)
Hemoglobin: 12.7 g/dL (ref 12.0–15.0)
MCHC: 33.2 g/dL (ref 30.0–36.0)
MCV: 90.2 fL (ref 78.0–100.0)
Platelets: 263 10*3/uL (ref 150.0–400.0)
RBC: 4.22 Mil/uL (ref 3.87–5.11)
RDW: 14.9 % (ref 11.5–15.5)
WBC: 7.1 10*3/uL (ref 4.0–10.5)

## 2023-05-29 LAB — HEPATIC FUNCTION PANEL
ALT: 19 U/L (ref 0–35)
AST: 32 U/L (ref 0–37)
Albumin: 5.1 g/dL (ref 3.5–5.2)
Alkaline Phosphatase: 50 U/L (ref 39–117)
Bilirubin, Direct: 0.2 mg/dL (ref 0.0–0.3)
Total Bilirubin: 1.1 mg/dL (ref 0.2–1.2)
Total Protein: 8.2 g/dL (ref 6.0–8.3)

## 2023-05-29 LAB — RENAL FUNCTION PANEL
Albumin: 5.1 g/dL (ref 3.5–5.2)
BUN: 35 mg/dL — ABNORMAL HIGH (ref 6–23)
CO2: 22 meq/L (ref 19–32)
Calcium: 10.5 mg/dL (ref 8.4–10.5)
Chloride: 100 meq/L (ref 96–112)
Creatinine, Ser: 2.02 mg/dL — ABNORMAL HIGH (ref 0.40–1.20)
GFR: 23.61 mL/min — ABNORMAL LOW (ref >60.00)
Glucose, Bld: 94 mg/dL (ref 70–99)
Phosphorus: 2.3 mg/dL (ref 2.3–4.6)
Potassium: 4.4 meq/L (ref 3.5–5.1)
Sodium: 137 meq/L (ref 135–145)

## 2023-05-29 LAB — POC URINALSYSI DIPSTICK (AUTOMATED)
Bilirubin, UA: NEGATIVE
Blood, UA: NEGATIVE
Glucose, UA: NEGATIVE
Leukocytes, UA: NEGATIVE
Nitrite, UA: NEGATIVE
Protein, UA: POSITIVE — AB
Spec Grav, UA: 1.025 (ref 1.010–1.025)
Urobilinogen, UA: 0.2 U/dL
pH, UA: 5 (ref 5.0–8.0)

## 2023-05-29 LAB — LIPASE: Lipase: 10 U/L — ABNORMAL LOW (ref 11.0–59.0)

## 2023-05-29 NOTE — Assessment & Plan Note (Addendum)
Wide differential due to multiple tender areas Had cholecystectomy but is tender there---can check labs to be sure no elevated enzymes Will check urine--could be bladder/UTI Had apparent endovascular AAA repair--but not tender mid abdomen Could be gastroenteritis or diverticulitis (but less likely this with multiple tender areas)  Urinalysis benign Will observe for now---with labs If pain gets severe---to ER If persists in next few days---will check ultrasound

## 2023-05-29 NOTE — Progress Notes (Signed)
Subjective:    Patient ID: Ann White, female    DOB: Mar 18, 1946, 77 y.o.   MRN: [id number]  HPI Here due to lower abdominal pain  Pain in both lower abdominal sides Has nausea--but no vomiting Started last night No suspect foods No fever, chills or sweats Bowels moved this morning--some trouble but then came out loose. No blood  Current Outpatient Medications on File Prior to Visit  Medication Sig Dispense Refill   acetaminophen (TYLENOL) 500 MG tablet Take 500-1,000 mg by mouth every 6 (six) hours as needed (knee pain.).     apixaban (ELIQUIS) 5 MG TABS tablet Take 1 tablet by mouth twice daily 180 tablet 1   cetirizine (ZYRTEC) 10 MG tablet Take 10 mg by mouth daily as needed for allergies.     Cholecalciferol (VITAMIN D3) 1000 UNITS CAPS Take 1 capsule (1,000 Units total) by mouth daily.     clonazePAM (KLONOPIN) 0.5 MG tablet Take 1 tablet (0.5 mg total) by mouth at bedtime as needed for anxiety (sleep). 30 tablet 0   diphenhydrAMINE (BENADRYL) 50 MG capsule Take one capsule 1 hour prior to scan. 1 capsule 0   dofetilide (TIKOSYN) 125 MCG capsule TAKE 1 CAPSULE BY MOUTH TWICE A DAY 180 capsule 3   Evolocumab (REPATHA SURECLICK) 140 MG/ML SOAJ Inject 140 mg into the skin every 14 (fourteen) days. 6 mL 3   famotidine (PEPCID) 20 MG tablet TAKE 1 TABLET BY MOUTH AT BEDTIME 90 tablet 1   gabapentin (NEURONTIN) 300 MG capsule Take 1 capsule (300 mg total) by mouth at bedtime.     Multiple Vitamin (MULTIVITAMIN WITH MINERALS) TABS tablet Take 1 tablet by mouth daily.     oxymetazoline (AFRIN) 0.05 % nasal spray Place 1 spray into both nostrils 2 (two) times daily as needed for congestion.     rosuvastatin (CRESTOR) 5 MG tablet TAKE 1 TABLET BY MOUTH THREE TIMES A WEEK 45 tablet 2   sertraline (ZOLOFT) 50 MG tablet Take 1 tablet (50 mg total) by mouth daily. 90 tablet 1   traMADol (ULTRAM) 50 MG tablet TAKE 1 TABLET BY MOUTH TWICE DAILY AS NEEDED FOR MODERATE PAIN 30 tablet 2    vitamin B-12 (CYANOCOBALAMIN) 500 MCG tablet Take 500 mcg by mouth daily.      pantoprazole (PROTONIX) 40 MG tablet Take 1 tablet (40 mg total) by mouth 2 (two) times daily. 180 tablet 3   No current facility-administered medications on file prior to visit.    Allergies  Allergen Reactions   Sulfa Antibiotics Swelling    Oral swelling   Nexletol [Bempedoic Acid]     Dizziness, "eyes felt funny"   Tricor [Fenofibrate] Other (See Comments)    myalgias   Zetia [Ezetimibe] Other (See Comments)    Dizziness   Contrast Media [Iodinated Contrast Media] Other (See Comments)    Oral contrast caused mouth blisters   Lipitor [Atorvastatin] Other (See Comments)    myalgias   Pravastatin Other (See Comments)    myalgias    Past Medical History:  Diagnosis Date   AAA (abdominal aortic aneurysm) (HCC)    Allergy    Anxiety    Arthritis    Carotid stenosis    a. 02/2016 Carotid U/S: Bilateral 40-59%; b. 03/2017 Carotid U/S: RICA 40-59%, LICA 1-39%; c. 04/2019 Carotid U/S: bilat 40-59% ICA dzs.   Cervical spondylosis    s/p spine injections   CKD (chronic kidney disease), stage III (HCC)    Depression  DVT (deep venous thrombosis) (HCC)    RLE DVT 05/27/15   Dysrhythmia    afib   Ectatic abdominal aorta (HCC) 2016   rpt [place] 5 yrs   GERD (gastroesophageal reflux disease)    History of chicken pox    History of diverticulitis of colon    HLD (hyperlipidemia)    HTN (hypertension)    Irritable bowel syndrome with constipation    Lichen sclerosus et atrophicus    NICM (nonischemic cardiomyopathy) (HCC)    a. 04/2017 Echo: EF 35-40%, diff HK; b. 05/2017 MV: EF 56%, no ischemia/infarct; c. 04/2018 Echo: EF 55-60%, no rwma, Gr2 DD; d. 03/2020 Echo: EF 40-45%, nl PASP, mod dil LA, mild to mod dil RA, Triv MR.   Obesity    Persistent atrial fibrillation (HCC)    a. Dx 03/2020. CHA2DS2VASc = 5-->xarelto 15mg  daily.   Premature atrial contraction    a. 04/2017 48hr Holter: Predominant rhythm  - sinus. Rare PVC's, freq PAC's with freq atrial runs up to 34 beats-->metoprolol started.   Renal insufficiency    Seasonal allergic rhinitis     Past Surgical History:  Procedure Laterality Date   ABDOMINAL AORTIC ANEURYSM REPAIR  09/2021   [facility]   ABDOMINAL AORTIC ANEURYSM REPAIR     ABDOMINAL AORTIC ENDOVASCULAR STENT GRAFT  10/03/2021   Procedure: ABDOMINAL AORTIC ENDOVASCULAR STENT GRAFT;  Surgeon: Maeola Harman, MD;  Location: [facility] OR;  Service: Vascular;;   ABDOMINAL HYSTERECTOMY     bladder tack  1990s   BREAST BIOPSY Right 03/05/2012    benign   CARDIOVERSION N/A 05/17/2020   Procedure: CARDIOVERSION;  Surgeon: Yvonne Kendall, MD;  Location: [facility] ORS;  Service: Cardiovascular;  Laterality: N/A;   CARPAL TUNNEL RELEASE  2004, 2013   bilateral   CHOLECYSTECTOMY  1990s   COLONOSCOPY  2002   COLONOSCOPY  06/2013   mod diverticulosis, 3 tubular adenomas, int hem rpt 5 yrs Russella Dar)   COLONOSCOPY  06/2018   TA,c olonic angiodysplastic lesion, mod diverticulosis, rpt 5 yrs Russella Dar)   DEXA  07/2011   normal, T score -0.3   ESOPHAGOGASTRODUODENOSCOPY ENDOSCOPY  2004   normal ([facility])   TOTAL ABDOMINAL HYSTERECTOMY W/ BILATERAL SALPINGOOPHORECTOMY  1990s   complete, dysmenorrhea and fibroids   ULTRASOUND GUIDANCE FOR VASCULAR ACCESS  10/03/2021   Procedure: ULTRASOUND GUIDANCE FOR VASCULAR ACCESS;  Surgeon: Maeola Harman, MD;  Location: [facility] OR;  Service: Vascular;;    Family History  Problem Relation Age of Onset   CAD Mother 59       MI   Hypertension Mother    Parkinson's disease Father    Diabetes Father    CAD Father 42       MI   Parkinson's disease Sister    Hyperlipidemia Sister    Memory loss Paternal Aunt    Cancer Neg Hx    Stroke Neg Hx    Colon cancer Neg Hx    Breast cancer Neg Hx    Colon polyps Neg Hx    Esophageal cancer Neg Hx    Stomach cancer Neg Hx    Rectal cancer Neg Hx     Social History   Socioeconomic History    Marital status: Married    Spouse name: Freddie   Number of children: 2   Years of education: Not on file   Highest education level: Not on file  Occupational History   Occupation: retired   Tobacco Use   Smoking status: Former  Current packs/day: 0.00    Average packs/day: 0.3 packs/day for 3.0 years (0.8 ttl pk-yrs)    Types: Cigarettes    Start date: 47    Quit date: 7    Years since quitting: 34.8   Smokeless tobacco: Never   Tobacco comments:    minimal smoking history  Vaping Use   Vaping status: Never Used  Substance and Sexual Activity   Alcohol use: No    Alcohol/week: 0.0 standard drinks of alcohol   Drug use: No   Sexual activity: Not Currently  Other Topics Concern   Not on file  Social History Narrative   Lives with husband and 2 cats.  2 grown children, 5 grandchildren.   Occupation: retired, worked in [employer]   Activity: no regular exercise.  Does walk in summer   Diet: fruits/vegetables daily, good water.   Social Determinants of Health   Financial Resource Strain: Low Risk  (07/13/2022)   Overall Financial Resource Strain (CARDIA)    Difficulty of Paying Living Expenses: Not very hard  Food Insecurity: No Food Insecurity (07/13/2022)   Hunger Vital Sign    Worried About Running Out of Food in the Last Year: Never true    Ran Out of Food in the Last Year: Never true  Transportation Needs: No Transportation Needs (07/13/2022)   PRAPARE - Administrator, Civil Service (Medical): No    Lack of Transportation (Non-Medical): No  Physical Activity: Inactive (07/12/2021)   Exercise Vital Sign    Days of Exercise per Week: 0 days    Minutes of Exercise per Session: 0 min  Stress: No Stress Concern Present (07/13/2022)   [employer] of Occupational Health - Occupational Stress Questionnaire    Feeling of Stress : Not at all  Social Connections: Moderately Integrated (07/13/2022)   Social Connection and Isolation Panel [NHANES]     Frequency of Communication with Friends and Family: More than three times a week    Frequency of Social Gatherings with Friends and Family: More than three times a week    Attends Religious Services: More than 4 times per year    Active Member of [employer] or Organizations: No    Attends Banker Meetings: Never    Marital Status: Married  Catering manager Violence: Not At Risk (07/13/2022)   Humiliation, Afraid, Rape, and Kick questionnaire    Fear of Current or Ex-Partner: No    Emotionally Abused: No    Physically Abused: No    Sexually Abused: No   Review of Systems Appetite is off---didn't eat this morning Drank some water No urinary frequency, urgency or dysuria No cough or SOB Some dizziness    Objective:   Physical Exam Constitutional:      Appearance: Normal appearance.  Pulmonary:     Effort: Pulmonary effort is normal.     Breath sounds: Normal breath sounds. No wheezing or rales.  Abdominal:     General: Bowel sounds are normal.     Palpations: Abdomen is soft.     Tenderness: There is no right CVA tenderness or left CVA tenderness.     Comments: Generalized pain---especially RUQ (borderline Murphy's sign), LLQ and especially suprapubic  Musculoskeletal:     Cervical back: Neck supple.     Right lower leg: No edema.     Left lower leg: No edema.  Lymphadenopathy:     Cervical: No cervical adenopathy.  Neurological:     Mental Status: She is alert.  Assessment & Plan:

## 2023-05-31 ENCOUNTER — Ambulatory Visit (INDEPENDENT_AMBULATORY_CARE_PROVIDER_SITE_OTHER): Payer: Medicare Other | Admitting: Family Medicine

## 2023-05-31 ENCOUNTER — Encounter: Payer: Self-pay | Admitting: Family Medicine

## 2023-05-31 VITALS — BP 156/84 | HR 65 | Temp 97.7°F | Ht 64.0 in | Wt 133.5 lb

## 2023-05-31 DIAGNOSIS — N390 Urinary tract infection, site not specified: Secondary | ICD-10-CM | POA: Diagnosis not present

## 2023-05-31 DIAGNOSIS — K219 Gastro-esophageal reflux disease without esophagitis: Secondary | ICD-10-CM | POA: Diagnosis not present

## 2023-05-31 DIAGNOSIS — R1906 Epigastric swelling, mass or lump: Secondary | ICD-10-CM

## 2023-05-31 DIAGNOSIS — I7143 Infrarenal abdominal aortic aneurysm, without rupture: Secondary | ICD-10-CM | POA: Diagnosis not present

## 2023-05-31 DIAGNOSIS — N1832 Chronic kidney disease, stage 3b: Secondary | ICD-10-CM | POA: Diagnosis not present

## 2023-05-31 DIAGNOSIS — I4819 Other persistent atrial fibrillation: Secondary | ICD-10-CM

## 2023-05-31 DIAGNOSIS — I10 Essential (primary) hypertension: Secondary | ICD-10-CM

## 2023-05-31 DIAGNOSIS — R1032 Left lower quadrant pain: Secondary | ICD-10-CM

## 2023-05-31 DIAGNOSIS — N179 Acute kidney failure, unspecified: Secondary | ICD-10-CM | POA: Diagnosis not present

## 2023-05-31 DIAGNOSIS — G3184 Mild cognitive impairment, so stated: Secondary | ICD-10-CM

## 2023-05-31 MED ORDER — FAMOTIDINE 20 MG PO TABS: 20.0000 mg | ORAL_TABLET | Freq: Two times a day (BID) | ORAL | Status: DC

## 2023-05-31 MED ORDER — AMOXICILLIN-POT CLAVULANATE 500-125 MG PO TABS: 1.0000 | ORAL_TABLET | Freq: Two times a day (BID) | ORAL | 0 refills | Status: DC

## 2023-05-31 NOTE — Progress Notes (Unsigned)
Ph: [phone] Fax: [phone]   Patient ID: Ann White, female    DOB: 1945-11-07, 77 y.o.   MRN: [id number]  This visit was conducted in person.  BP 138/76   Pulse 65   Temp 97.7 F (36.5 C) (Oral)   Ht 5\' 4"  (1.626 m)   Wt 133 lb 8 oz (60.6 kg)   SpO2 94%   BMI 22.92 kg/m   Orthostatic Vitals for the past 48 hrs (Last 6 readings):  Patient Position Orthostatic BP BP Pulse  05/31/23 1432 -- -- 138/76 65  05/31/23 1439 Supine 152/72 -- --  05/31/23 1442 Standing 176/82 -- --   CC: abd pain  Subjective:   HPI: Ann White is a 77 y.o. female presenting on 05/31/2023 for Abdominal Pain (Here for abd pain, body aches f/u and dizziness. Seen recently. Pt accompanied by husband, [name]. )   I last saw pt 03/2023 with concern for recurrent UTI contributing to worsening cognition.   Subsequently saw Dr Patsy Lager 03/2023 with steroid injection for R knee osteoarthritis, also recommended topical diclofenac.   Saw neurology Dr Frances Furbish on 05/14/2023 - possible vascular dementia, did not tolerate previous trial of namenda, rec against aricept trial In bradycardia. Referred for home sleep test to r/o OSA contributing. Suggested fully tapering off klonopin and gabapentin.   Saw urology Dr Sherron Monday 05/16/2023 - UA abnormal, started on prophylactic trimethoprim 100mg  daily, planned renal US in 1 month. She has started drinking cranberry juice.   Saw cardiology 05/27/2023 in h/o persistent afib on tikosyn and eliquis - noted AKI with Cr 2.2 in setting of poor po intake and possible diarrheal illness. Losartan was stopped.  CrCl 23  Planned non-contrasted CT abd/pelvis to follow up known AAA s/p endovascular repair ([facility] Dr Randie Heinz).   Saw Dr Alphonsus Sias on Wednesday with lower abdominal pain, note reviewed. Lipase was normal, UA overall reassuring, CBC normal. Cr persistently elevated at 2.02, BUN 35.  No fevers/chills, nausea/vomiting. She did have loose stools recently and  indigestion.   She's not been feeling well. Dizzy over the past week, acutely worse today, trouble sleeping especially the past 3 nights.  No chest pain, tightness, dyspnea. 11 lb weight loss in the past 2 months - notes decreased appetite. Diarrhea for 3-4 wks. Loose stools, not watery, no blood or black tarry stools. Worse with greasy foods . Treating with pepcid.   They note memory has been doing better - think UTI treatment helped this.  She's been out of gabapentin for the past 1-2 wks.      Relevant past medical, surgical, family and social history reviewed and updated as indicated. Interim medical history since our last visit reviewed. Allergies and medications reviewed and updated. Outpatient Medications Prior to Visit  Medication Sig Dispense Refill   acetaminophen (TYLENOL) 500 MG tablet Take 500-1,000 mg by mouth every 6 (six) hours as needed (knee pain.).     apixaban (ELIQUIS) 5 MG TABS tablet Take 1 tablet by mouth twice daily 180 tablet 1   cetirizine (ZYRTEC) 10 MG tablet Take 10 mg by mouth daily as needed for allergies.     Cholecalciferol (VITAMIN D3) 1000 UNITS CAPS Take 1 capsule (1,000 Units total) by mouth daily.     clonazePAM (KLONOPIN) 0.5 MG tablet Take 1 tablet (0.5 mg total) by mouth at bedtime as needed for anxiety (sleep). 30 tablet 0   diphenhydrAMINE (BENADRYL) 50 MG capsule Take one capsule 1 hour prior to scan. 1 capsule 0  dofetilide (TIKOSYN) 125 MCG capsule TAKE 1 CAPSULE BY MOUTH TWICE A DAY 180 capsule 3   Evolocumab (REPATHA SURECLICK) 140 MG/ML SOAJ Inject 140 mg into the skin every 14 (fourteen) days. 6 mL 3   famotidine (PEPCID) 20 MG tablet TAKE 1 TABLET BY MOUTH AT BEDTIME 90 tablet 1   gabapentin (NEURONTIN) 300 MG capsule Take 1 capsule (300 mg total) by mouth at bedtime.     Multiple Vitamin (MULTIVITAMIN WITH MINERALS) TABS tablet Take 1 tablet by mouth daily.     oxymetazoline (AFRIN) 0.05 % nasal spray Place 1 spray into both nostrils 2  (two) times daily as needed for congestion.     pantoprazole (PROTONIX) 40 MG tablet Take 1 tablet (40 mg total) by mouth 2 (two) times daily. 180 tablet 3   rosuvastatin (CRESTOR) 5 MG tablet TAKE 1 TABLET BY MOUTH THREE TIMES A WEEK 45 tablet 2   sertraline (ZOLOFT) 50 MG tablet Take 1 tablet (50 mg total) by mouth daily. 90 tablet 1   traMADol (ULTRAM) 50 MG tablet TAKE 1 TABLET BY MOUTH TWICE DAILY AS NEEDED FOR MODERATE PAIN 30 tablet 2   trimethoprim (TRIMPEX) 100 MG tablet Take 100 mg by mouth daily.     vitamin B-12 (CYANOCOBALAMIN) 500 MCG tablet Take 500 mcg by mouth daily.      No facility-administered medications prior to visit.     Per HPI unless specifically indicated in ROS section below Review of Systems  Objective:  BP 138/76   Pulse 65   Temp 97.7 F (36.5 C) (Oral)   Ht 5\' 4"  (1.626 m)   Wt 133 lb 8 oz (60.6 kg)   SpO2 94%   BMI 22.92 kg/m   Wt Readings from Last 3 Encounters:  05/31/23 133 lb 8 oz (60.6 kg)  05/29/23 136 lb (61.7 kg)  05/27/23 138 lb 3.2 oz (62.7 kg)      Physical Exam    Results for orders placed or performed in visit on 05/29/23  Lipase  Result Value Ref Range   Lipase 10.0 (L) 11.0 - 59.0 U/L  Hepatic function panel  Result Value Ref Range   Total Bilirubin 1.1 0.2 - 1.2 mg/dL   Bilirubin, Direct 0.2 0.0 - 0.3 mg/dL   Alkaline Phosphatase 50 39 - 117 U/L   AST 32 0 - 37 U/L   ALT 19 0 - 35 U/L   Total Protein 8.2 6.0 - 8.3 g/dL   Albumin 5.1 3.5 - 5.2 g/dL  Renal function panel  Result Value Ref Range   Sodium 137 135 - 145 mEq/L   Potassium 4.4 3.5 - 5.1 mEq/L   Chloride 100 96 - 112 mEq/L   CO2 22 19 - 32 mEq/L   Albumin 5.1 3.5 - 5.2 g/dL   BUN 35 (H) 6 - 23 mg/dL   Creatinine, Ser 1.61 (H) 0.40 - 1.20 mg/dL   Glucose, Bld 94 70 - 99 mg/dL   Phosphorus 2.3 2.3 - 4.6 mg/dL   GFR 09.60 (L) >45.40 mL/min   Calcium 10.5 8.4 - 10.5 mg/dL  CBC  Result Value Ref Range   WBC 7.1 4.0 - 10.5 K/uL   RBC 4.22 3.87 - 5.11  Mil/uL   Platelets 263.0 150.0 - 400.0 K/uL   Hemoglobin 12.7 12.0 - 15.0 g/dL   HCT 98.1 19.1 - 47.8 %   MCV 90.2 78.0 - 100.0 fl   MCHC 33.2 30.0 - 36.0 g/dL   RDW 29.5 62.1 -  15.5 %  POCT Urinalysis Dipstick (Automated)  Result Value Ref Range   Color, UA straw    Clarity, UA clear    Glucose, UA Negative Negative   Bilirubin, UA negative    Ketones, UA trace    Spec Grav, UA 1.025 1.010 - 1.025   Blood, UA negative    pH, UA 5.0 5.0 - 8.0   Protein, UA Positive (A) Negative   Urobilinogen, UA 0.2 0.2 or 1.0 E.U./dL   Nitrite, UA negative    Leukocytes, UA Negative Negative    Assessment & Plan:   Problem List Items Addressed This Visit   None    No orders of the defined types were placed in this encounter.   No orders of the defined types were placed in this encounter.   There are no Patient Instructions on file for this visit.  Follow up plan: No follow-ups on file.  Eustaquio Boyden, MD

## 2023-05-31 NOTE — Patient Instructions (Addendum)
Stop trimethoprim antibiotic (UTI prevention) Drop pantoprazole to 40mg  once daily. Ok to continue pepcid 20mg  twice daily.  Start augmentin antibiotic sent to pharmacy to treat possible diverticulitis take for 10 days.  I'm worried about pancreas - we will await CT abdomen and pelvis for Monday.  Return to see me on Wednesday.

## 2023-06-01 ENCOUNTER — Encounter: Payer: Self-pay | Admitting: Family Medicine

## 2023-06-01 DIAGNOSIS — N179 Acute kidney failure, unspecified: Secondary | ICD-10-CM | POA: Insufficient documentation

## 2023-06-01 DIAGNOSIS — N189 Chronic kidney disease, unspecified: Secondary | ICD-10-CM | POA: Insufficient documentation

## 2023-06-01 DIAGNOSIS — R1906 Epigastric swelling, mass or lump: Secondary | ICD-10-CM | POA: Insufficient documentation

## 2023-06-01 NOTE — Assessment & Plan Note (Signed)
Noted today, firm, nontender, not pulsatile.  Pt /husband note this mass comes and goes, present for the past 3-4 weeks.  She does have recent weight loss, abd pain, diarrhea and worsening kidney function. She denies dysphagia, nausea/vomiting, early satiety.  Await CT scan scheduled for tomorrow.

## 2023-06-01 NOTE — Assessment & Plan Note (Signed)
With recent deteriorated kidney function, I did recommend she drop pantoprazole back to once daily, ok to continue pepcid BID.

## 2023-06-01 NOTE — Assessment & Plan Note (Signed)
S/p EVAR sees [facility] Randie Heinz) yearly with imaging.  This year unable to get contrasted study, CT abd/pelvis without contrast pending for next week.

## 2023-06-01 NOTE — Assessment & Plan Note (Addendum)
Abd pain localized to LLQ, present for weeks, associated with diarrhea anorexia and weight loss. No noted fevers.  Concern for possible simmering diverticulitis (noted diverticulosis on prior colonoscopy).  Will Rx augmentin 10d course, renally dosed (500mg ).  Await CT scan scheduled for on Monday.  RTC Wednesday for follow up visit, recheck kidney function. Pt /husband agree with plan.

## 2023-06-01 NOTE — Assessment & Plan Note (Signed)
She sees EP on Eliquis and Tikosyn

## 2023-06-01 NOTE — Assessment & Plan Note (Addendum)
Did not tolerate namenda, avoiding donepezil in cardiac history/bradycardia.  Saw neurology planned HST to eval for OSA. Actually pt/husband note memory has significantly improved with UTI treatment.  She ran out of gabapentin 1-2 wks ago, no noted need at this time - will remain off.

## 2023-06-01 NOTE — Assessment & Plan Note (Signed)
Saw urology started on ppx tmp daily - hold as per above. Also was planning renal US at 6wk f/u - will await CT next week.

## 2023-06-01 NOTE — Assessment & Plan Note (Addendum)
Cr increased from 1.3 in July to 2.2 noted this year.  This in setting of newly started trimethoprim prophylactic for UTI which could contribute to worsening kidney function - hold daily trimethoprim.  Will also drop PPI to once daily.  Continue to hold losartan.  Await non-contrasted CT abd/pelvis scheduled for Monday.  RTC next week for recheck kidney function, symptoms.

## 2023-06-01 NOTE — Assessment & Plan Note (Addendum)
Chronic, BP mildly elevated as losartan is currently on hold after AKI. No changes made.

## 2023-06-03 ENCOUNTER — Ambulatory Visit
Admission: RE | Admit: 2023-06-03 | Discharge: 2023-06-03 | Disposition: A | Discharge status: Home / Self Care | Payer: Medicare Other | Source: Ambulatory Visit | Attending: Vascular Surgery | Admitting: Vascular Surgery

## 2023-06-03 DIAGNOSIS — K5730 Diverticulosis of large intestine without perforation or abscess without bleeding: Secondary | ICD-10-CM | POA: Insufficient documentation

## 2023-06-03 DIAGNOSIS — I7143 Infrarenal abdominal aortic aneurysm, without rupture: Secondary | ICD-10-CM | POA: Diagnosis not present

## 2023-06-03 DIAGNOSIS — Z91041 Radiographic dye allergy status: Secondary | ICD-10-CM | POA: Insufficient documentation

## 2023-06-03 DIAGNOSIS — M4316 Spondylolisthesis, lumbar region: Secondary | ICD-10-CM | POA: Diagnosis not present

## 2023-06-03 DIAGNOSIS — T82330A Leakage of aortic (bifurcation) graft (replacement), initial encounter: Secondary | ICD-10-CM | POA: Insufficient documentation

## 2023-06-03 DIAGNOSIS — Z9889 Other specified postprocedural states: Secondary | ICD-10-CM | POA: Diagnosis not present

## 2023-06-03 DIAGNOSIS — Z9049 Acquired absence of other specified parts of digestive tract: Secondary | ICD-10-CM | POA: Diagnosis not present

## 2023-06-05 ENCOUNTER — Encounter: Payer: Self-pay | Admitting: Family Medicine

## 2023-06-05 ENCOUNTER — Ambulatory Visit: Payer: Medicare Other | Admitting: Family Medicine

## 2023-06-05 ENCOUNTER — Encounter: Payer: Self-pay | Admitting: Vascular Surgery

## 2023-06-05 ENCOUNTER — Ambulatory Visit (INDEPENDENT_AMBULATORY_CARE_PROVIDER_SITE_OTHER): Payer: Medicare Other | Admitting: Vascular Surgery

## 2023-06-05 VITALS — BP 130/72 | HR 79 | Temp 97.8°F | Ht 64.0 in | Wt 133.1 lb

## 2023-06-05 VITALS — BP 119/82 | HR 74 | Temp 98.4°F | Resp 20 | Ht 64.0 in | Wt 131.0 lb

## 2023-06-05 DIAGNOSIS — R42 Dizziness and giddiness: Secondary | ICD-10-CM | POA: Diagnosis not present

## 2023-06-05 DIAGNOSIS — N390 Urinary tract infection, site not specified: Secondary | ICD-10-CM | POA: Diagnosis not present

## 2023-06-05 DIAGNOSIS — N179 Acute kidney failure, unspecified: Secondary | ICD-10-CM | POA: Diagnosis not present

## 2023-06-05 DIAGNOSIS — R1906 Epigastric swelling, mass or lump: Secondary | ICD-10-CM | POA: Diagnosis not present

## 2023-06-05 DIAGNOSIS — Z9889 Other specified postprocedural states: Secondary | ICD-10-CM

## 2023-06-05 DIAGNOSIS — Z8679 Personal history of other diseases of the circulatory system: Secondary | ICD-10-CM | POA: Diagnosis not present

## 2023-06-05 DIAGNOSIS — I10 Essential (primary) hypertension: Secondary | ICD-10-CM | POA: Diagnosis not present

## 2023-06-05 DIAGNOSIS — I4819 Other persistent atrial fibrillation: Secondary | ICD-10-CM | POA: Diagnosis not present

## 2023-06-05 DIAGNOSIS — R1032 Left lower quadrant pain: Secondary | ICD-10-CM

## 2023-06-05 DIAGNOSIS — N1832 Chronic kidney disease, stage 3b: Secondary | ICD-10-CM | POA: Diagnosis not present

## 2023-06-05 DIAGNOSIS — Z95828 Presence of other vascular implants and grafts: Secondary | ICD-10-CM

## 2023-06-05 LAB — CBC WITH DIFFERENTIAL/PLATELET
Basophils Absolute: 0 K/uL (ref 0.0–0.1)
Basophils Relative: 0.3 % (ref 0.0–3.0)
Eosinophils Absolute: 0 K/uL (ref 0.0–0.7)
Eosinophils Relative: 0.3 % (ref 0.0–5.0)
HCT: 39.6 % (ref 36.0–46.0)
Hemoglobin: 13.2 g/dL (ref 12.0–15.0)
Lymphocytes Relative: 13.9 % (ref 12.0–46.0)
Lymphs Abs: 1 K/uL (ref 0.7–4.0)
MCHC: 33.4 g/dL (ref 30.0–36.0)
MCV: 90.7 fl (ref 78.0–100.0)
Monocytes Absolute: 0.2 K/uL (ref 0.1–1.0)
Monocytes Relative: 3.5 % (ref 3.0–12.0)
Neutro Abs: 5.7 K/uL (ref 1.4–7.7)
Neutrophils Relative %: 82 % — ABNORMAL HIGH (ref 43.0–77.0)
Platelets: 254 K/uL (ref 150.0–400.0)
RBC: 4.36 Mil/uL (ref 3.87–5.11)
RDW: 15.1 % (ref 11.5–15.5)
WBC: 7 K/uL (ref 4.0–10.5)

## 2023-06-05 LAB — RENAL FUNCTION PANEL
Albumin: 5 g/dL (ref 3.5–5.2)
BUN: 40 mg/dL — ABNORMAL HIGH (ref 6–23)
CO2: 22 meq/L (ref 19–32)
Calcium: 10.1 mg/dL (ref 8.4–10.5)
Chloride: 100 meq/L (ref 96–112)
Creatinine, Ser: 1.64 mg/dL — ABNORMAL HIGH (ref 0.40–1.20)
GFR: 30.31 mL/min — ABNORMAL LOW (ref >60.00)
Glucose, Bld: 101 mg/dL — ABNORMAL HIGH (ref 70–99)
Phosphorus: 3.5 mg/dL (ref 2.3–4.6)
Potassium: 4.6 meq/L (ref 3.5–5.1)
Sodium: 136 meq/L (ref 135–145)

## 2023-06-05 NOTE — Progress Notes (Unsigned)
Ph: [phone] Fax: [phone]   Patient ID: Ann White, female    DOB: [birth year], 77 y.o.   MRN: [id number]  This visit was conducted in person.  BP 130/72   Pulse 79   Temp 97.8 F (36.6 C) (Oral)   Ht 5\' 4"  (1.626 m)   Wt 133 lb 2 oz (60.4 kg)   SpO2 95%   BMI 22.85 kg/m   BP Readings from Last 3 Encounters:  06/05/23 130/72  05/31/23 (!) 156/84  05/29/23 130/84    Pulse Readings from Last 3 Encounters:  06/05/23 79  05/31/23 65  05/29/23 68   CC: 1 week follow up visit  Subjective:   HPI: Ann White is a 77 y.o. female presenting on 06/05/2023 for Medical Management of Chronic Issues (Here for f/u. Pt accompanied by husband, [name]. )   See prior note for details.  Patient seen here last month with acute on chronic kidney injury with deteriorated creatinine from baseline 1.2 up to 2.2 as well as some lower abdominal discomfort localizing to left lower quadrant, diarrhea anorexia and weight loss.  She had recently started trimethoprim for UTI prevention.  Cardiology stopped losartan I stopped trimethoprim and dropped pantoprazole dose to once daily.  I also started empiric Augmentin course to treat possible diverticulitis.  Over last several weeks she also came off of gabapentin.  She notes she also stopped klonopin 2-3 wks ago.   She underwent noncontrasted CT abdomen pelvis earlier this week to follow-up known AAA status post endovascular repair (Dr Randie Heinz), read pending. On my preliminary review kidneys, spleen, liver seem ok.   Ongoing dizziness described as lightheaded associated with trouble getting full breaths. No vertigo, presyncope/syncope. Also nauseated.  No fever/chills, chest pain, cough.  Ongoing upper abd soreness and knot in epigastric region that can be tender.  Lower abd pain has improved on augmentin antibiotic.  Remains low appetite. No weight loss in the past week.  No noted leg weakness.   Spoke with daughter Joni Reining about  questions she had.      Relevant past medical, surgical, family and social history reviewed and updated as indicated. Interim medical history since our last visit reviewed. Allergies and medications reviewed and updated. Outpatient Medications Prior to Visit  Medication Sig Dispense Refill   acetaminophen (TYLENOL) 500 MG tablet Take 500-1,000 mg by mouth every 6 (six) hours as needed (knee pain.).     amoxicillin-clavulanate (AUGMENTIN) 500-125 MG tablet Take 1 tablet by mouth in the morning and at bedtime. 20 tablet 0   apixaban (ELIQUIS) 5 MG TABS tablet Take 1 tablet by mouth twice daily 180 tablet 1   cetirizine (ZYRTEC) 10 MG tablet Take 10 mg by mouth daily as needed for allergies.     Cholecalciferol (VITAMIN D3) 1000 UNITS CAPS Take 1 capsule (1,000 Units total) by mouth daily.     clonazePAM (KLONOPIN) 0.5 MG tablet Take 0.5 tablets (0.25 mg total) by mouth at bedtime as needed for anxiety (sleep).     dofetilide (TIKOSYN) 125 MCG capsule TAKE 1 CAPSULE BY MOUTH TWICE A DAY 180 capsule 3   Evolocumab (REPATHA SURECLICK) 140 MG/ML SOAJ Inject 140 mg into the skin every 14 (fourteen) days. 6 mL 3   famotidine (PEPCID) 20 MG tablet Take 1 tablet (20 mg total) by mouth 2 (two) times daily.     Multiple Vitamin (MULTIVITAMIN WITH MINERALS) TABS tablet Take 1 tablet by mouth daily.     oxymetazoline (AFRIN) 0.05 %  nasal spray Place 1 spray into both nostrils 2 (two) times daily as needed for congestion.     pantoprazole (PROTONIX) 40 MG tablet Take 1 tablet (40 mg total) by mouth daily.     rosuvastatin (CRESTOR) 5 MG tablet TAKE 1 TABLET BY MOUTH THREE TIMES A WEEK 45 tablet 2   sertraline (ZOLOFT) 50 MG tablet Take 1 tablet (50 mg total) by mouth daily. 90 tablet 1   traMADol (ULTRAM) 50 MG tablet TAKE 1 TABLET BY MOUTH TWICE DAILY AS NEEDED FOR MODERATE PAIN 30 tablet 2   vitamin B-12 (CYANOCOBALAMIN) 500 MCG tablet Take 500 mcg by mouth daily.      No facility-administered medications  prior to visit.     Per HPI unless specifically indicated in ROS section below Review of Systems  Objective:  BP 130/72   Pulse 79   Temp 97.8 F (36.6 C) (Oral)   Ht 5\' 4"  (1.626 m)   Wt 133 lb 2 oz (60.4 kg)   SpO2 95%   BMI 22.85 kg/m   Wt Readings from Last 3 Encounters:  06/05/23 133 lb 2 oz (60.4 kg)  05/31/23 133 lb 8 oz (60.6 kg)  05/29/23 136 lb (61.7 kg)      Physical Exam Vitals and nursing note reviewed.  Constitutional:      Appearance: Normal appearance. She is not ill-appearing.  HENT:     Head: Normocephalic and atraumatic.     Mouth/Throat:     Mouth: Mucous membranes are moist.     Pharynx: Oropharynx is clear. No oropharyngeal exudate or posterior oropharyngeal erythema.  Eyes:     Extraocular Movements: Extraocular movements intact.     Pupils: Pupils are equal, round, and reactive to light.  Cardiovascular:     Rate and Rhythm: Normal rate. Rhythm irregularly irregular.     Pulses: Normal pulses.     Heart sounds: Normal heart sounds. No murmur heard. Pulmonary:     Effort: Pulmonary effort is normal. No respiratory distress.     Breath sounds: Normal breath sounds. No wheezing, rhonchi or rales.  Abdominal:     General: Bowel sounds are normal. There is no distension.     Palpations: Abdomen is soft. There is mass (epigastric, non-pulsatile, nontender). There is no hepatomegaly or splenomegaly.     Tenderness: There is no abdominal tenderness. There is no right CVA tenderness, left CVA tenderness, guarding or rebound. Negative signs include Murphy's sign.     Hernia: No hernia is present.       Comments: Anticipate epigastric mass palpated is AAA stent  Musculoskeletal:     Right lower leg: No edema.     Left lower leg: No edema.  Skin:    General: Skin is warm and dry.     Findings: No rash.  Neurological:     Mental Status: She is alert.  Psychiatric:        Mood and Affect: Mood normal.        Behavior: Behavior normal.        Results for orders placed or performed in visit on 05/29/23  Lipase  Result Value Ref Range   Lipase 10.0 (L) 11.0 - 59.0 U/L  Hepatic function panel  Result Value Ref Range   Total Bilirubin 1.1 0.2 - 1.2 mg/dL   Bilirubin, Direct 0.2 0.0 - 0.3 mg/dL   Alkaline Phosphatase 50 39 - 117 U/L   AST 32 0 - 37 U/L   ALT 19 0 -  35 U/L   Total Protein 8.2 6.0 - 8.3 g/dL   Albumin 5.1 3.5 - 5.2 g/dL  Renal function panel  Result Value Ref Range   Sodium 137 135 - 145 mEq/L   Potassium 4.4 3.5 - 5.1 mEq/L   Chloride 100 96 - 112 mEq/L   CO2 22 19 - 32 mEq/L   Albumin 5.1 3.5 - 5.2 g/dL   BUN 35 (H) 6 - 23 mg/dL   Creatinine, Ser 1.61 (H) 0.40 - 1.20 mg/dL   Glucose, Bld 94 70 - 99 mg/dL   Phosphorus 2.3 2.3 - 4.6 mg/dL   GFR 09.60 (L) >45.40 mL/min   Calcium 10.5 8.4 - 10.5 mg/dL  CBC  Result Value Ref Range   WBC 7.1 4.0 - 10.5 K/uL   RBC 4.22 3.87 - 5.11 Mil/uL   Platelets 263.0 150.0 - 400.0 K/uL   Hemoglobin 12.7 12.0 - 15.0 g/dL   HCT 98.1 19.1 - 47.8 %   MCV 90.2 78.0 - 100.0 fl   MCHC 33.2 30.0 - 36.0 g/dL   RDW 29.5 62.1 - 30.8 %  POCT Urinalysis Dipstick (Automated)  Result Value Ref Range   Color, UA straw    Clarity, UA clear    Glucose, UA Negative Negative   Bilirubin, UA negative    Ketones, UA trace    Spec Grav, UA 1.025 1.010 - 1.025   Blood, UA negative    pH, UA 5.0 5.0 - 8.0   Protein, UA Positive (A) Negative   Urobilinogen, UA 0.2 0.2 or 1.0 E.U./dL   Nitrite, UA negative    Leukocytes, UA Negative Negative    Assessment & Plan:   Problem List Items Addressed This Visit     LLQ abdominal pain   Relevant Orders   CBC with Differential/Platelet   Acute-on-chronic kidney injury (HCC) - Primary   Relevant Orders   Renal function panel     No orders of the defined types were placed in this encounter.   Orders Placed This Encounter  Procedures   Renal function panel   CBC with Differential/Platelet    There are no Patient Instructions  on file for this visit.  Follow up plan: No follow-ups on file.  Eustaquio Boyden, MD

## 2023-06-05 NOTE — Progress Notes (Signed)
Patient ID: Ann White, female   DOB: 11/08/1945, 77 y.o.   MRN: [id number]  Reason for Consult: Follow-up   Referred by Eustaquio Boyden, MD  Subjective:     HPI:  Ann White is a 77 y.o. female history of abdominal aortic aneurysm underwent repair about 20 months ago.  She is recently here with duplex with concern for type Ib endoleak now follows up with CT scan although this was performed without contrast due to renal insufficiency.  She does not have any new back or abdominal pain today.  Past Medical History:  Diagnosis Date   AAA (abdominal aortic aneurysm) (HCC)    Allergy    Anxiety    Arthritis    Carotid stenosis    a. 02/2016 Carotid U/S: Bilateral 40-59%; b. 03/2017 Carotid U/S: RICA 40-59%, LICA 1-39%; c. 04/2019 Carotid U/S: bilat 40-59% ICA dzs.   Cervical spondylosis    s/p spine injections   CKD (chronic kidney disease), stage III (HCC)    Depression    DVT (deep venous thrombosis) (HCC)    RLE DVT 05/27/15   Dysrhythmia    afib   Ectatic abdominal aorta (HCC) 2016   rpt [place] 5 yrs   GERD (gastroesophageal reflux disease)    History of chicken pox    History of diverticulitis of colon    HLD (hyperlipidemia)    HTN (hypertension)    Irritable bowel syndrome with constipation    Lichen sclerosus et atrophicus    NICM (nonischemic cardiomyopathy) (HCC)    a. 04/2017 Echo: EF 35-40%, diff HK; b. 05/2017 MV: EF 56%, no ischemia/infarct; c. 04/2018 Echo: EF 55-60%, no rwma, Gr2 DD; d. 03/2020 Echo: EF 40-45%, nl PASP, mod dil LA, mild to mod dil RA, Triv MR.   Obesity    Persistent atrial fibrillation (HCC)    a. Dx 03/2020. CHA2DS2VASc = 5-->xarelto 15mg  daily.   Premature atrial contraction    a. 04/2017 48hr Holter: Predominant rhythm - sinus. Rare PVC's, freq PAC's with freq atrial runs up to 34 beats-->metoprolol started.   Renal insufficiency    Seasonal allergic rhinitis    Family History  Problem Relation Age of Onset   CAD Mother 55        MI   Hypertension Mother    Parkinson's disease Father    Diabetes Father    CAD Father 62       MI   Parkinson's disease Sister    Hyperlipidemia Sister    Memory loss Paternal Aunt    Cancer Neg Hx    Stroke Neg Hx    Colon cancer Neg Hx    Breast cancer Neg Hx    Colon polyps Neg Hx    Esophageal cancer Neg Hx    Stomach cancer Neg Hx    Rectal cancer Neg Hx    Past Surgical History:  Procedure Laterality Date   ABDOMINAL AORTIC ANEURYSM REPAIR  09/2021   [facility]   ABDOMINAL AORTIC ANEURYSM REPAIR     ABDOMINAL AORTIC ENDOVASCULAR STENT GRAFT  10/03/2021   Procedure: ABDOMINAL AORTIC ENDOVASCULAR STENT GRAFT;  Surgeon: Maeola Harman, MD;  Location: [facility] OR;  Service: Vascular;;   ABDOMINAL HYSTERECTOMY     bladder tack  1990s   BREAST BIOPSY Right 03/05/2012    benign   CARDIOVERSION N/A 05/17/2020   Procedure: CARDIOVERSION;  Surgeon: Yvonne Kendall, MD;  Location: [facility] ORS;  Service: Cardiovascular;  Laterality: N/A;   CARPAL TUNNEL RELEASE  2004, 2013   bilateral   CHOLECYSTECTOMY  1990s   COLONOSCOPY  2002   COLONOSCOPY  06/2013   mod diverticulosis, 3 tubular adenomas, int hem rpt 5 yrs Russella Dar)   COLONOSCOPY  06/2018   TA,c olonic angiodysplastic lesion, mod diverticulosis, rpt 5 yrs Russella Dar)   DEXA  07/2011   normal, T score -0.3   ESOPHAGOGASTRODUODENOSCOPY ENDOSCOPY  2004   normal ([facility])   TOTAL ABDOMINAL HYSTERECTOMY W/ BILATERAL SALPINGOOPHORECTOMY  1990s   complete, dysmenorrhea and fibroids   ULTRASOUND GUIDANCE FOR VASCULAR ACCESS  10/03/2021   Procedure: ULTRASOUND GUIDANCE FOR VASCULAR ACCESS;  Surgeon: Maeola Harman, MD;  Location: [facility] OR;  Service: Vascular;;    Short Social History:  Social History   Tobacco Use   Smoking status: Former    Current packs/day: 0.00    Average packs/day: 0.3 packs/day for 3.0 years (0.8 ttl pk-yrs)    Types: Cigarettes    Start date: 48    Quit date: 1990    Years since quitting:  34.9   Smokeless tobacco: Never   Tobacco comments:    minimal smoking history  Substance Use Topics   Alcohol use: No    Alcohol/week: 0.0 standard drinks of alcohol    Allergies  Allergen Reactions   Sulfa Antibiotics Swelling    Oral swelling   Nexletol [Bempedoic Acid]     Dizziness, "eyes felt funny"   Tricor [Fenofibrate] Other (See Comments)    myalgias   Zetia [Ezetimibe] Other (See Comments)    Dizziness   Contrast Media [Iodinated Contrast Media] Other (See Comments)    Oral contrast caused mouth blisters   Lipitor [Atorvastatin] Other (See Comments)    myalgias   Pravastatin Other (See Comments)    myalgias    Current Outpatient Medications  Medication Sig Dispense Refill   acetaminophen (TYLENOL) 500 MG tablet Take 500-1,000 mg by mouth every 6 (six) hours as needed (knee pain.).     amoxicillin-clavulanate (AUGMENTIN) 500-125 MG tablet Take 1 tablet by mouth in the morning and at bedtime. 20 tablet 0   apixaban (ELIQUIS) 5 MG TABS tablet Take 1 tablet by mouth twice daily 180 tablet 1   cetirizine (ZYRTEC) 10 MG tablet Take 10 mg by mouth daily as needed for allergies.     Cholecalciferol (VITAMIN D3) 1000 UNITS CAPS Take 1 capsule (1,000 Units total) by mouth daily.     clonazePAM (KLONOPIN) 0.5 MG tablet Take 0.5 tablets (0.25 mg total) by mouth at bedtime as needed for anxiety (sleep).     dofetilide (TIKOSYN) 125 MCG capsule TAKE 1 CAPSULE BY MOUTH TWICE A DAY 180 capsule 3   Evolocumab (REPATHA SURECLICK) 140 MG/ML SOAJ Inject 140 mg into the skin every 14 (fourteen) days. 6 mL 3   famotidine (PEPCID) 20 MG tablet Take 1 tablet (20 mg total) by mouth 2 (two) times daily.     Multiple Vitamin (MULTIVITAMIN WITH MINERALS) TABS tablet Take 1 tablet by mouth daily.     oxymetazoline (AFRIN) 0.05 % nasal spray Place 1 spray into both nostrils 2 (two) times daily as needed for congestion.     pantoprazole (PROTONIX) 40 MG tablet Take 1 tablet (40 mg total) by mouth  daily.     rosuvastatin (CRESTOR) 5 MG tablet TAKE 1 TABLET BY MOUTH THREE TIMES A WEEK 45 tablet 2   sertraline (ZOLOFT) 50 MG tablet Take 1 tablet (50 mg total) by mouth daily. 90 tablet 1   traMADol (ULTRAM) 50  MG tablet TAKE 1 TABLET BY MOUTH TWICE DAILY AS NEEDED FOR MODERATE PAIN 30 tablet 2   vitamin B-12 (CYANOCOBALAMIN) 500 MCG tablet Take 500 mcg by mouth daily.      No current facility-administered medications for this visit.    Review of Systems  Constitutional:  Constitutional negative. HENT: HENT negative.  Eyes: Eyes negative.  Respiratory: Respiratory negative.  Cardiovascular: Cardiovascular negative.  GI: Gastrointestinal negative.  Musculoskeletal: Musculoskeletal negative.  Skin: Skin negative.  Neurological: Neurological negative. Hematologic: Hematologic/lymphatic negative.  Psychiatric: Psychiatric negative.        Objective:  Objective  Vitals:   06/05/23 1412  BP: 119/82  Pulse: 74  Resp: 20  Temp: 98.4 F (36.9 C)  SpO2: 96%    Physical Exam HENT:     Head: Normocephalic.     Nose: Nose normal.  Eyes:     Pupils: Pupils are equal, round, and reactive to light.  Cardiovascular:     Pulses:          Popliteal pulses are 3+ on the right side and 3+ on the left side.       Dorsalis pedis pulses are 1+ on the right side and 1+ on the left side.  Pulmonary:     Effort: Pulmonary effort is normal.  Abdominal:     General: Abdomen is flat.  Musculoskeletal:     Right lower leg: No edema.     Left lower leg: No edema.  Skin:    General: Skin is warm.     Capillary Refill: Capillary refill takes less than 2 seconds.  Neurological:     Mental Status: She is alert.  Psychiatric:        Mood and Affect: Mood normal.        Thought Content: Thought content normal.        Judgment: Judgment normal.     Data: CT reviewed with patient, formal read pending     Assessment/Plan:     77 year old female with history of endovascular aneurysm  repair initially had a concern for type II endoleak and most recent duplex demonstrated concern for type Ib endoleak for which she now follows up with CT.  CT unfortunately performed without contrast due to renal insufficiency however there appears to be good apposition with the stent proximally as well as with both limbs distally and the aneurysm sac also appears to be progressing.  As such we will have her follow-up with [facility] duplex in 1 year.     Maeola Harman MD [facility]

## 2023-06-05 NOTE — Patient Instructions (Signed)
Try taking 1/2 a klonopin  when you get home. Labs today

## 2023-06-06 ENCOUNTER — Encounter: Payer: Self-pay | Admitting: Family Medicine

## 2023-06-06 DIAGNOSIS — R42 Dizziness and giddiness: Secondary | ICD-10-CM | POA: Insufficient documentation

## 2023-06-06 NOTE — Assessment & Plan Note (Signed)
Anticipate palpable firm epigastric mass is actually AAA graft stent.

## 2023-06-06 NOTE — Assessment & Plan Note (Signed)
Continues Eliquis and Tikosyn.

## 2023-06-06 NOTE — Assessment & Plan Note (Signed)
She will remain off daily trimethoprim until urology f/u.

## 2023-06-06 NOTE — Assessment & Plan Note (Signed)
Augmentin started last week for presumptive diverticulitis component with LLQ, diarrhea, anorexia and weight loss.  LLQ pain is better - rec finish augmentin course.

## 2023-06-06 NOTE — Assessment & Plan Note (Signed)
BP remains stable off losartan. BP also stable from sitting to standing.

## 2023-06-06 NOTE — Assessment & Plan Note (Addendum)
Cr trending down 2.2 -> 2.0, baseline 1.3. Update labs today. Continue to hold trimethoprim, continue to hold losartan, continue lower pantoprazole dose.

## 2023-06-06 NOTE — Assessment & Plan Note (Signed)
Ongoing dizziness associated with malaise.  I did talk with daughter RN Levada Schilling.  They do mention she did stop klonopin 0.5mg  (had been taking 1/2 tab nightly for years). I would anticipate she would have tolerated slow taper off long acting benzo however timing correlates to when dizziness/malaise may have started. Suggested she try restarting klonopin to see effect on symptoms.

## 2023-06-11 ENCOUNTER — Other Ambulatory Visit: Payer: Self-pay

## 2023-06-11 DIAGNOSIS — Z8679 Personal history of other diseases of the circulatory system: Secondary | ICD-10-CM

## 2023-06-12 ENCOUNTER — Encounter: Payer: Self-pay | Admitting: Family Medicine

## 2023-06-12 ENCOUNTER — Ambulatory Visit (INDEPENDENT_AMBULATORY_CARE_PROVIDER_SITE_OTHER): Payer: Medicare Other | Admitting: Family Medicine

## 2023-06-12 VITALS — BP 118/66 | HR 70 | Temp 97.6°F | Ht 64.0 in | Wt 132.2 lb

## 2023-06-12 DIAGNOSIS — R5382 Chronic fatigue, unspecified: Secondary | ICD-10-CM | POA: Diagnosis not present

## 2023-06-12 DIAGNOSIS — N390 Urinary tract infection, site not specified: Secondary | ICD-10-CM

## 2023-06-12 DIAGNOSIS — D6869 Other thrombophilia: Secondary | ICD-10-CM | POA: Diagnosis not present

## 2023-06-12 DIAGNOSIS — N179 Acute kidney failure, unspecified: Secondary | ICD-10-CM | POA: Diagnosis not present

## 2023-06-12 DIAGNOSIS — I5022 Chronic systolic (congestive) heart failure: Secondary | ICD-10-CM

## 2023-06-12 DIAGNOSIS — R109 Unspecified abdominal pain: Secondary | ICD-10-CM

## 2023-06-12 DIAGNOSIS — I7143 Infrarenal abdominal aortic aneurysm, without rupture: Secondary | ICD-10-CM

## 2023-06-12 DIAGNOSIS — K219 Gastro-esophageal reflux disease without esophagitis: Secondary | ICD-10-CM | POA: Diagnosis not present

## 2023-06-12 DIAGNOSIS — I4819 Other persistent atrial fibrillation: Secondary | ICD-10-CM

## 2023-06-12 DIAGNOSIS — G3184 Mild cognitive impairment, so stated: Secondary | ICD-10-CM

## 2023-06-12 DIAGNOSIS — N1832 Chronic kidney disease, stage 3b: Secondary | ICD-10-CM

## 2023-06-12 DIAGNOSIS — Z95828 Presence of other vascular implants and grafts: Secondary | ICD-10-CM

## 2023-06-12 LAB — RENAL FUNCTION PANEL
Albumin: 4.4 g/dL (ref 3.5–5.2)
BUN: 38 mg/dL — ABNORMAL HIGH (ref 6–23)
CO2: 25 meq/L (ref 19–32)
Calcium: 9.6 mg/dL (ref 8.4–10.5)
Chloride: 101 meq/L (ref 96–112)
Creatinine, Ser: 2.24 mg/dL — ABNORMAL HIGH (ref 0.40–1.20)
GFR: 20.85 mL/min — ABNORMAL LOW (ref >60.00)
Glucose, Bld: 132 mg/dL — ABNORMAL HIGH (ref 70–99)
Phosphorus: 2.8 mg/dL (ref 2.3–4.6)
Potassium: 4.1 meq/L (ref 3.5–5.1)
Sodium: 136 meq/L (ref 135–145)

## 2023-06-12 LAB — SEDIMENTATION RATE: Sed Rate: 57 mm/h — ABNORMAL HIGH (ref 0–30)

## 2023-06-12 LAB — TSH: TSH: 1.01 u[IU]/mL (ref 0.35–5.50)

## 2023-06-12 LAB — VITAMIN B12: Vitamin B-12: >1537 pg/mL — ABNORMAL HIGH (ref 211–911)

## 2023-06-12 LAB — POC URINALSYSI DIPSTICK (AUTOMATED)
Bilirubin, UA: NEGATIVE
Blood, UA: NEGATIVE
Glucose, UA: NEGATIVE
Ketones, UA: NEGATIVE
Nitrite, UA: NEGATIVE
Protein, UA: POSITIVE — AB
Spec Grav, UA: 1.025 (ref 1.010–1.025)
Urobilinogen, UA: 0.2 U/dL
pH, UA: 5 (ref 5.0–8.0)

## 2023-06-12 LAB — VITAMIN D 25 HYDROXY (VIT D DEFICIENCY, FRACTURES): VITD: 33.4 ng/mL (ref 30.00–100.00)

## 2023-06-12 MED ORDER — APIXABAN 2.5 MG PO TABS: 2.5000 mg | ORAL_TABLET | Freq: Two times a day (BID) | ORAL | 1 refills | Status: DC

## 2023-06-12 MED ORDER — CLONAZEPAM 0.5 MG PO TABS: 0.2500 mg | ORAL_TABLET | Freq: Every evening | ORAL | 0 refills | Status: DC | PRN

## 2023-06-12 NOTE — Progress Notes (Signed)
Ph: [phone] Fax: [phone]   Patient ID: Ann White, female    DOB: 1945-11-03, 77 y.o.   MRN: [id number]  This visit was conducted in person.  BP 118/66   Pulse 70   Temp 97.6 F (36.4 C) (Oral)   Ht 5\' 4"  (1.626 m)   Wt 132 lb 4 oz (60 kg)   SpO2 98%   BMI 22.70 kg/m    CC: L flank pain  Subjective:   HPI: Ann White is a 77 y.o. female presenting on 06/12/2023 for Flank Pain (C/o L side pain. Started several days ago. Feels better today. )   See prior notes for details. Overall has been feeling better with re-commencement of Klonopin - she is using 1 whole pill at night - rec change to 1/2 tab prn. She did recently complete Augmentin course (renally dosed) for presumed diverticulitis presenting with left lower quadrant abdominal pain.   Recent noncontrasted CT abd/pelvis read as diverticulosis without diverticulitis, 8mm L4/5 anterolisthesis and enlargement of AAA s/p endovascular repair. S/p cholecystectomy and hysterectomy  Still feeling fatigued, tired "trouble getting going". Legs felt weak last week, now better. She did have RLQ abd discomfort overnight - used heating pad. She is sleeping well. Ongoing memory trouble but overall improved since UTIs have been treated. Ongoing for 3-4 months.   No fevers/chills, diarrhea, nausea/vomiting. Infrequent constipation. No UTI symptoms of dysuria, urgency/ frequency. No chest pain, dyspnea, or cough. No dizziness, palpitations.   On Eliquis and Tikosyn for Afib as well as h/o DVT. Likely needs lower dose eliquis as weight 60 kg and Cr 1.6. already on appropriate lower Tikosyn dose.      Relevant past medical, surgical, family and social history reviewed and updated as indicated. Interim medical history since our last visit reviewed. Allergies and medications reviewed and updated. Outpatient Medications Prior to Visit  Medication Sig Dispense Refill   acetaminophen (TYLENOL) 500 MG tablet Take  500-1,000 mg by mouth every 6 (six) hours as needed (knee pain.).     amoxicillin-clavulanate (AUGMENTIN) 500-125 MG tablet Take 1 tablet by mouth in the morning and at bedtime. 20 tablet 0   cetirizine (ZYRTEC) 10 MG tablet Take 10 mg by mouth daily as needed for allergies.     Cholecalciferol (VITAMIN D3) 1000 UNITS CAPS Take 1 capsule (1,000 Units total) by mouth daily.     dofetilide (TIKOSYN) 125 MCG capsule TAKE 1 CAPSULE BY MOUTH TWICE A DAY 180 capsule 3   Evolocumab (REPATHA SURECLICK) 140 MG/ML SOAJ Inject 140 mg into the skin every 14 (fourteen) days. 6 mL 3   famotidine (PEPCID) 20 MG tablet Take 1 tablet (20 mg total) by mouth 2 (two) times daily.     Multiple Vitamin (MULTIVITAMIN WITH MINERALS) TABS tablet Take 1 tablet by mouth daily.     oxymetazoline (AFRIN) 0.05 % nasal spray Place 1 spray into both nostrils 2 (two) times daily as needed for congestion.     pantoprazole (PROTONIX) 40 MG tablet Take 1 tablet (40 mg total) by mouth daily.     rosuvastatin (CRESTOR) 5 MG tablet TAKE 1 TABLET BY MOUTH THREE TIMES A WEEK 45 tablet 2   sertraline (ZOLOFT) 50 MG tablet Take 1 tablet (50 mg total) by mouth daily. 90 tablet 1   traMADol (ULTRAM) 50 MG tablet TAKE 1 TABLET BY MOUTH TWICE DAILY AS NEEDED FOR MODERATE PAIN 30 tablet 2   apixaban (ELIQUIS) 5 MG TABS tablet Take 1 tablet by  mouth twice daily 180 tablet 1   clonazePAM (KLONOPIN) 0.5 MG tablet Take 0.5 tablets (0.25 mg total) by mouth at bedtime as needed for anxiety (sleep).     vitamin B-12 (CYANOCOBALAMIN) 500 MCG tablet Take 500 mcg by mouth daily.      No facility-administered medications prior to visit.     Per HPI unless specifically indicated in ROS section below Review of Systems  Objective:  BP 118/66   Pulse 70   Temp 97.6 F (36.4 C) (Oral)   Ht 5\' 4"  (1.626 m)   Wt 132 lb 4 oz (60 kg)   SpO2 98%   BMI 22.70 kg/m   Wt Readings from Last 3 Encounters:  06/12/23 132 lb 4 oz (60 kg)  06/05/23 131 lb  (59.4 kg)  06/05/23 133 lb 2 oz (60.4 kg)      Physical Exam Vitals and nursing note reviewed.  Constitutional:      Appearance: Normal appearance. She is not ill-appearing.  HENT:     Head: Normocephalic and atraumatic.     Mouth/Throat:     Mouth: Mucous membranes are moist.     Pharynx: Oropharynx is clear. No oropharyngeal exudate or posterior oropharyngeal erythema.  Eyes:     Extraocular Movements: Extraocular movements intact.     Pupils: Pupils are equal, round, and reactive to light.  Cardiovascular:     Rate and Rhythm: Normal rate. Rhythm irregular.     Pulses: Normal pulses.     Heart sounds: Normal heart sounds. No murmur heard.    Comments: Mildly irregular Pulmonary:     Effort: Pulmonary effort is normal. No respiratory distress.     Breath sounds: Normal breath sounds. No wheezing, rhonchi or rales.  Abdominal:     General: Bowel sounds are normal. There is no distension.     Palpations: Abdomen is soft. There is no mass.     Tenderness: There is no abdominal tenderness. There is no guarding or rebound.     Hernia: No hernia is present.  Musculoskeletal:     Right lower leg: No edema.     Left lower leg: No edema.  Skin:    General: Skin is warm and dry.     Findings: No rash.  Neurological:     Mental Status: She is alert.  Psychiatric:        Mood and Affect: Mood normal.        Behavior: Behavior normal.       Results for orders placed or performed in visit on 06/12/23  TSH  Result Value Ref Range   TSH 1.01 0.35 - 5.50 uIU/mL  Vitamin B12  Result Value Ref Range   Vitamin B-12 >1537 (H) 211 - 911 pg/mL  VITAMIN D 25 Hydroxy (Vit-D Deficiency, Fractures)  Result Value Ref Range   VITD 33.40 30.00 - 100.00 ng/mL  Renal function panel  Result Value Ref Range   Sodium 136 135 - 145 mEq/L   Potassium 4.1 3.5 - 5.1 mEq/L   Chloride 101 96 - 112 mEq/L   CO2 25 19 - 32 mEq/L   Albumin 4.4 3.5 - 5.2 g/dL   BUN 38 (H) 6 - 23 mg/dL   Creatinine,  Ser 9.14 (H) 0.40 - 1.20 mg/dL   Glucose, Bld 782 (H) 70 - 99 mg/dL   Phosphorus 2.8 2.3 - 4.6 mg/dL   GFR 95.62 (L) >13.08 mL/min   Calcium 9.6 8.4 - 10.5 mg/dL  Sedimentation rate  Result  Value Ref Range   Sed Rate 57 (H) 0 - 30 mm/hr  POCT Urinalysis Dipstick (Automated)  Result Value Ref Range   Color, UA yellow    Clarity, UA clear    Glucose, UA Negative Negative   Bilirubin, UA negative    Ketones, UA negative    Spec Grav, UA 1.025 1.010 - 1.025   Blood, UA negative    pH, UA 5.0 5.0 - 8.0   Protein, UA Positive (A) Negative   Urobilinogen, UA 0.2 0.2 or 1.0 E.U./dL   Nitrite, UA negative    Leukocytes, UA Trace (A) Negative   Micro: WBC few RBC rare Bact tr Casts none Epi 5-10 UCx not sent   CT ABDOMEN PELVIS WO CONTRAST CLINICAL DATA:  History of abdominal aortic aneurysm repair. Examination performed without intravenous contrast due to contrast allergy.  EXAM: CT ABDOMEN AND PELVIS WITHOUT CONTRAST  TECHNIQUE: Multidetector CT imaging of the abdomen and pelvis was performed following the standard protocol without IV contrast.  RADIATION DOSE REDUCTION: This exam was performed according to the departmental dose-optimization program which includes automated exposure control, adjustment of the mA and/or kV according to patient size and/or use of iterative reconstruction technique.  COMPARISON:  Prior CT scan of the abdomen and pelvis 06/01/2022  FINDINGS: Lower chest: No acute abnormality.  Hepatobiliary: No focal liver abnormality is seen. Status post cholecystectomy. No biliary dilatation.  Pancreas: Unremarkable. No pancreatic ductal dilatation or surrounding inflammatory changes.  Spleen: Normal in size without focal abnormality.  Adrenals/Urinary Tract: Adrenal glands are unremarkable. Kidneys are normal, without renal calculi, focal lesion, or hydronephrosis. Bladder is unremarkable.  Stomach/Bowel: Colonic diverticular disease without CT  evidence of active inflammation. No evidence of obstruction or focal bowel wall thickening. Normal appendix in the right lower quadrant. The terminal ileum is unremarkable.  Vascular/Lymphatic: Limited evaluation in the absence of intravenous contrast. Abdominal aortic aneurysm status post endovascular aortic repair with a bifurcated endoprosthesis. Utilizing similar measurement technique, the aneurysm has slightly enlarged to 5.2 x 4.6 cm compared to 4.8 x 4.5 cm previously. Endoleak cannot be assessed in the absence of intravenous contrast. Extensive atherosclerotic vascular calcifications.  Reproductive: Status post hysterectomy. No adnexal masses.  Other: No abdominal wall hernia or abnormality. No abdominopelvic ascites.  Musculoskeletal: Anterolisthesis of L4 on L5 measures approximately 8 mm. Multilevel degenerative disc disease. No acute fracture or bony lesion.  IMPRESSION: 1. Limited evaluation in the absence of intravenous contrast. 2. Infrarenal abdominal aortic aneurysm status post endovascular aortic repair with evidence of aneurysm growth to 5.2 x 4.6 cm compared to 4.8 x 4.5 cm 1 year previously. This suggests the presence of an endoleak. Consider further evaluation with CTA of the abdomen and pelvis following appropriate premedication for the patient's underlying contrast allergy. 3. Anterolisthesis of L4 on L5 measures approximately 8 mm. 4. Colonic diverticular disease without CT evidence of active inflammation.  Signed,  Sterling Big, MD, RPVI  [facility]  [facility]  Electronically Signed   By: Malachy Moan M.D.   On: 06/12/2023 07:35  Assessment & Plan:   Problem List Items Addressed This Visit     GERD (gastroesophageal reflux disease)    Stable period on just once daily pantoprazole       Chronic kidney disease, stage 3b (HCC)    Update renal function. Addendum ==> Recurrent  deterioration noted of unclear cause.  Initial improvement after holding trimethoprim. We also decreased PPI dose to once daily and  cardiology held losartan.  UA without active sediment. ESR elevated.  Anticipate component of prerenal azotemia.  Will refer to nephrology for acute on chronic kidney disease.       Relevant Orders   Ambulatory referral to Nephrology   Infrarenal abdominal aortic aneurysm (AAA) without rupture (HCC)    Recent CT read as stable by [facility]).       Relevant Medications   apixaban (ELIQUIS) 2.5 MG TABS tablet   Persistent atrial fibrillation (HCC)    Continues renally dose Tikosyn. Will renally dose Eliquis 2.5mg  BID       Relevant Medications   apixaban (ELIQUIS) 2.5 MG TABS tablet   Chronic HFrEF (heart failure with reduced ejection fraction) (HCC)    Seems euvolemic, now off losartan.      Relevant Medications   apixaban (ELIQUIS) 2.5 MG TABS tablet   Secondary hypercoagulable state (HCC)    Continue eliquis in afib.  Given weight and kidney function, will drop eliquis dose to 2.5mg  BID.       Recurrent UTI    UA without signs of current infection.  Ppx tmp currently on hold.       History of repair of aneurysm of abdominal aorta using endovascular stent graft   Amnestic MCI (mild cognitive impairment with memory loss)    Ongoing difficulty.  Namenda intolerance, avoiding donepezil in bradycardia.      Acute-on-chronic kidney injury [facility])   Relevant Orders   POCT Urinalysis Dipstick (Automated) (Completed)   Ambulatory referral to Nephrology   Chronic fatigue - Primary    Ongoing for months, in setting of increased physical activity due to husband's physical limitations.  Will check labs to further evaluate reversible causes of fatigue.       Relevant Orders   TSH (Completed)   Vitamin B12 (Completed)   VITAMIN D 25 Hydroxy (Vit-D Deficiency, Fractures) (Completed)   Renal function panel (Completed)   Sedimentation rate  (Completed)     Meds ordered this encounter  Medications   apixaban (ELIQUIS) 2.5 MG TABS tablet    Sig: Take 1 tablet (2.5 mg total) by mouth 2 (two) times daily.    Dispense:  60 tablet    Refill:  1   clonazePAM (KLONOPIN) 0.5 MG tablet    Sig: Take 0.5 tablets (0.25 mg total) by mouth at bedtime as needed for anxiety (sleep).    Dispense:  30 tablet    Refill:  0   vitamin B-12 (CYANOCOBALAMIN) 500 MCG tablet    Sig: Take 1 tablet (500 mcg total) by mouth once a week.    Orders Placed This Encounter  Procedures   TSH   Vitamin B12   VITAMIN D 25 Hydroxy (Vit-D Deficiency, Fractures)   Renal function panel   Sedimentation rate   Ambulatory referral to Nephrology    Referral Priority:   Routine    Referral Type:   Consultation    Referral Reason:   Specialty Services Required    Requested Specialty:   Nephrology    Number of Visits Requested:   1   POCT Urinalysis Dipstick (Automated)    Patient Instructions  Labs today to check for cause of ongoing fatigue. Cut klonopin dose to 1/2 tablet at night - may try to use as needed.  Drop eliquis to 2.5mg  twice daily due to kidney function and weight - new dose sent to pharmacy. Double check on dosing with heart doctor next month.  Keep [place] updated with your symptoms.  Follow up plan: Return if symptoms worsen or fail to improve.  Eustaquio Boyden, MD

## 2023-06-12 NOTE — Patient Instructions (Addendum)
Labs today to check for cause of ongoing fatigue. Cut klonopin dose to 1/2 tablet at night - may try to use as needed.  Drop eliquis to 2.5mg  twice daily due to kidney function and weight - new dose sent to pharmacy. Double check on dosing with heart doctor next month.  Keep [place] updated with your symptoms.

## 2023-06-15 ENCOUNTER — Encounter: Payer: Self-pay | Admitting: Family Medicine

## 2023-06-15 DIAGNOSIS — R5382 Chronic fatigue, unspecified: Secondary | ICD-10-CM | POA: Insufficient documentation

## 2023-06-15 MED ORDER — VITAMIN B-12 500 MCG PO TABS: 500.0000 ug | ORAL_TABLET | ORAL | Status: AC

## 2023-06-15 NOTE — Assessment & Plan Note (Signed)
Continues renally dose Tikosyn. Will renally dose Eliquis 2.5mg  BID

## 2023-06-15 NOTE — Assessment & Plan Note (Signed)
UA without signs of current infection.  Ppx tmp currently on hold.

## 2023-06-15 NOTE — Assessment & Plan Note (Signed)
Continue eliquis in afib.  Given weight and kidney function, will drop eliquis dose to 2.5mg  BID.

## 2023-06-15 NOTE — Assessment & Plan Note (Signed)
Ongoing for months, in setting of increased physical activity due to husband's physical limitations.  Will check labs to further evaluate reversible causes of fatigue.

## 2023-06-15 NOTE — Assessment & Plan Note (Signed)
Ongoing difficulty.  Namenda intolerance, avoiding donepezil in bradycardia.

## 2023-06-15 NOTE — Assessment & Plan Note (Signed)
Recent CT read as stable by [facility]).

## 2023-06-15 NOTE — Assessment & Plan Note (Signed)
Seems euvolemic, now off losartan.

## 2023-06-15 NOTE — Assessment & Plan Note (Signed)
Stable period on just once daily pantoprazole

## 2023-06-15 NOTE — Assessment & Plan Note (Addendum)
Update renal function. Addendum ==> Recurrent deterioration noted of unclear cause.  Initial improvement after holding trimethoprim. We also decreased PPI dose to once daily and cardiology held losartan.  UA without active sediment. ESR elevated.  Anticipate component of prerenal azotemia.  Will refer to nephrology for acute on chronic kidney disease.

## 2023-06-17 ENCOUNTER — Telehealth: Payer: Self-pay

## 2023-06-17 NOTE — Telephone Encounter (Addendum)
Attempted to contact pt. No answer. No vm. Need to relay lab results and Dr Timoteo Expose message (see Labs, Result Notes- 06/12/23). I've also mailed info to pt.   Labs/Dr G's msg: Your vitamin B12 is now too high - decrease vit  B12 replacement to 500 mcg once weekly.  Your kidney function is again deteriorated. I want to refer you to the kidney doctor in [place] or further evaluation of this. You will get a call to get scheduled. Try to increase water intake in the meantime.  Your thyroid and vitamin D levels were ok.

## 2023-06-18 NOTE — Telephone Encounter (Signed)
Attempted to contact pt. No answer. No vm. Need to relay lab results and Dr Timoteo Expose message (see Labs, Result Notes- 06/12/23). I've also mailed info to pt.   Labs/Dr G's msg: Your vitamin B12 is now too high - decrease vit  B12 replacement to 500 mcg once weekly.  Your kidney function is again deteriorated. I want to refer you to the kidney doctor in [place] or further evaluation of this. You will get a call to get scheduled. Try to increase water intake in the meantime.  Your thyroid and vitamin D levels were ok.

## 2023-06-19 NOTE — Telephone Encounter (Addendum)
Attempted to contact pt. No answer. No vm. Need to relay lab results and Dr Timoteo Expose message (see Labs, Result Notes- 06/12/23). I've also mailed info to pt. Mailed letter on 06/17/23.   Labs/Dr G's msg: Your vitamin B12 is now too high - decrease vit  B12 replacement to 500 mcg once weekly.  Your kidney function is again deteriorated. I want to refer you to the kidney doctor in [place] or further evaluation of this. You will get a call to get scheduled. Try to increase water intake in the meantime.  Your thyroid and vitamin D levels were ok.

## 2023-06-20 DIAGNOSIS — N390 Urinary tract infection, site not specified: Secondary | ICD-10-CM | POA: Diagnosis not present

## 2023-06-20 DIAGNOSIS — R350 Frequency of micturition: Secondary | ICD-10-CM | POA: Diagnosis not present

## 2023-06-22 ENCOUNTER — Encounter: Payer: Self-pay | Admitting: *Deleted

## 2023-06-26 ENCOUNTER — Encounter: Payer: Self-pay | Admitting: Medical

## 2023-06-26 ENCOUNTER — Ambulatory Visit: Payer: Medicare Other | Attending: Medical | Admitting: Medical

## 2023-06-26 VITALS — BP 121/60 | HR 71 | Ht 64.0 in | Wt 131.6 lb

## 2023-06-26 DIAGNOSIS — I5022 Chronic systolic (congestive) heart failure: Secondary | ICD-10-CM

## 2023-06-26 DIAGNOSIS — N189 Chronic kidney disease, unspecified: Secondary | ICD-10-CM

## 2023-06-26 DIAGNOSIS — I428 Other cardiomyopathies: Secondary | ICD-10-CM

## 2023-06-26 DIAGNOSIS — I4819 Other persistent atrial fibrillation: Secondary | ICD-10-CM

## 2023-06-26 DIAGNOSIS — I10 Essential (primary) hypertension: Secondary | ICD-10-CM | POA: Diagnosis not present

## 2023-06-26 DIAGNOSIS — E782 Mixed hyperlipidemia: Secondary | ICD-10-CM | POA: Diagnosis not present

## 2023-06-26 NOTE — Patient Instructions (Addendum)
Medication Instructions:  - No changes *If you need a refill on your cardiac medications before your next appointment, please call your pharmacy*  Lab Work: - None ordered  Testing/Procedures: - None ordered  Follow-Up: At [facility], you and your health needs are our priority.  As part of our continuing mission to provide you with exceptional heart care, we have created designated Provider Care Teams.  These Care Teams include your primary Cardiologist (physician) and Advanced Practice Providers (APPs -  Physician Assistants and Nurse Practitioners) who all work together to provide you with the care you need, when you need it.  Your next appointment:   3 month with Dr. Lalla Brothers 6 month with PA-C [facility] [address]. [place], [place] [address] [phone]

## 2023-06-26 NOTE — Progress Notes (Signed)
Cardiology Office Note:    Date:  06/26/2023   ID:  Ann White, DOB Feb 02, 1946, MRN [id number]  PCP:  Eustaquio Boyden, MD  [facility] Cardiologist:  Yvonne Kendall, MD  [facility] Electrophysiologist:  Lanier Prude, MD   Referring MD: Eustaquio Boyden, MD   Chief Complaint: lab follow-up  History of Present Illness:    Ann White is a 77 y.o. female with a hx of mild cognitive impairment, NICM diagnosed in 04/2018 (MPI without perfusion defects LVEF as low as 35-40% but normalized on repeat echo 1-/2019), persistent atrial fibrillation diagnosed in 03/2020 on dofetilide, carotid artery stenosis, right calf DVT, HTN, HLD, AAA s/p EVAR and CKD who presents for follow-up.    The patient was last seen 05/27/23 and reported GI issues for 3 days.  It was noted that creatinine went up to 2.2 and losartan was stopped.  Follow-up BMP was ordered which showed improved Scr/BUN.  Today, the patient reports she has been feelig OK. She denies chest pain or SOB. No lower leg edema, dizziness or lightheadedness. It appears kidney function initially improved but subsequently worsened.  PCP has referred patient to nephrology.  Blood pressure good today off losartan.  Past Medical History:  Diagnosis Date   AAA (abdominal aortic aneurysm) (HCC)    Allergy    Anxiety    Arthritis    Carotid stenosis    a. 02/2016 Carotid U/S: Bilateral 40-59%; b. 03/2017 Carotid U/S: RICA 40-59%, LICA 1-39%; c. 04/2019 Carotid U/S: bilat 40-59% ICA dzs.   Cervical spondylosis    s/p spine injections   CKD (chronic kidney disease), stage III (HCC)    Depression    DVT (deep venous thrombosis) (HCC)    RLE DVT 05/27/15   Dysrhythmia    afib   Ectatic abdominal aorta (HCC) 2016   rpt [place] 5 yrs   GERD (gastroesophageal reflux disease)    History of chicken pox    History of diverticulitis of colon    HLD (hyperlipidemia)    HTN (hypertension)    Irritable bowel syndrome with  constipation    Lichen sclerosus et atrophicus    NICM (nonischemic cardiomyopathy) (HCC)    a. 04/2017 Echo: EF 35-40%, diff HK; b. 05/2017 MV: EF 56%, no ischemia/infarct; c. 04/2018 Echo: EF 55-60%, no rwma, Gr2 DD; d. 03/2020 Echo: EF 40-45%, nl PASP, mod dil LA, mild to mod dil RA, Triv MR.   Obesity    Persistent atrial fibrillation (HCC)    a. Dx 03/2020. CHA2DS2VASc = 5-->xarelto 15mg  daily.   Premature atrial contraction    a. 04/2017 48hr Holter: Predominant rhythm - sinus. Rare PVC's, freq PAC's with freq atrial runs up to 34 beats-->metoprolol started.   Renal insufficiency    Seasonal allergic rhinitis     Past Surgical History:  Procedure Laterality Date   ABDOMINAL AORTIC ANEURYSM REPAIR  09/2021   [facility]   ABDOMINAL AORTIC ENDOVASCULAR STENT GRAFT  10/03/2021   Procedure: ABDOMINAL AORTIC ENDOVASCULAR STENT GRAFT;  Surgeon: Maeola Harman, MD;  Location: [facility] OR;  Service: Vascular;;   bladder tack  1990s   BREAST BIOPSY Right 03/05/2012    benign   CARDIOVERSION N/A 05/17/2020   Procedure: CARDIOVERSION;  Surgeon: Yvonne Kendall, MD;  Location: [facility] ORS;  Service: Cardiovascular;  Laterality: N/A;   CARPAL TUNNEL RELEASE  2004, 2013   bilateral   CHOLECYSTECTOMY  1990s   COLONOSCOPY  2002   COLONOSCOPY  06/2013   mod diverticulosis,  3 tubular adenomas, int hem rpt 5 yrs Russella Dar)   COLONOSCOPY  06/2018   TA,c olonic angiodysplastic lesion, mod diverticulosis, rpt 5 yrs Russella Dar)   DEXA  07/2011   normal, T score -0.3   ESOPHAGOGASTRODUODENOSCOPY ENDOSCOPY  2004   normal ([facility])   TOTAL ABDOMINAL HYSTERECTOMY W/ BILATERAL SALPINGOOPHORECTOMY  1990s   complete, dysmenorrhea and fibroids   ULTRASOUND GUIDANCE FOR VASCULAR ACCESS  10/03/2021   Procedure: ULTRASOUND GUIDANCE FOR VASCULAR ACCESS;  Surgeon: Maeola Harman, MD;  Location: [facility] OR;  Service: Vascular;;    Current Medications: Current Meds  Medication Sig   acetaminophen (TYLENOL) 500  MG tablet Take 500-1,000 mg by mouth every 6 (six) hours as needed (knee pain.).   amoxicillin-clavulanate (AUGMENTIN) 500-125 MG tablet Take 1 tablet by mouth in the morning and at bedtime.   apixaban (ELIQUIS) 2.5 MG TABS tablet Take 1 tablet (2.5 mg total) by mouth 2 (two) times daily.   cetirizine (ZYRTEC) 10 MG tablet Take 10 mg by mouth daily as needed for allergies.   Cholecalciferol (VITAMIN D3) 1000 UNITS CAPS Take 1 capsule (1,000 Units total) by mouth daily.   clonazePAM (KLONOPIN) 0.5 MG tablet Take 0.5 tablets (0.25 mg total) by mouth at bedtime as needed for anxiety (sleep).   dofetilide (TIKOSYN) 125 MCG capsule TAKE 1 CAPSULE BY MOUTH TWICE A DAY   Evolocumab (REPATHA SURECLICK) 140 MG/ML SOAJ Inject 140 mg into the skin every 14 (fourteen) days.   famotidine (PEPCID) 20 MG tablet Take 1 tablet (20 mg total) by mouth 2 (two) times daily.   Multiple Vitamin (MULTIVITAMIN WITH MINERALS) TABS tablet Take 1 tablet by mouth daily.   oxymetazoline (AFRIN) 0.05 % nasal spray Place 1 spray into both nostrils 2 (two) times daily as needed for congestion.   pantoprazole (PROTONIX) 40 MG tablet Take 1 tablet (40 mg total) by mouth daily.   rosuvastatin (CRESTOR) 5 MG tablet TAKE 1 TABLET BY MOUTH THREE TIMES A WEEK   sertraline (ZOLOFT) 50 MG tablet Take 1 tablet (50 mg total) by mouth daily.   traMADol (ULTRAM) 50 MG tablet TAKE 1 TABLET BY MOUTH TWICE DAILY AS NEEDED FOR MODERATE PAIN   vitamin B-12 (CYANOCOBALAMIN) 500 MCG tablet Take 1 tablet (500 mcg total) by mouth once a week.     Allergies:   Sulfa antibiotics, Nexletol [bempedoic acid], Tricor [fenofibrate], Zetia [ezetimibe], Contrast media [iodinated contrast media], Lipitor [atorvastatin], and Pravastatin   Social History   Socioeconomic History   Marital status: Married    Spouse name: Freddie   Number of children: 2   Years of education: Not on file   Highest education level: Not on file  Occupational History    Occupation: retired   Tobacco Use   Smoking status: Former    Current packs/day: 0.00    Average packs/day: 0.3 packs/day for 3.0 years (0.8 ttl pk-yrs)    Types: Cigarettes    Start date: 41    Quit date: 1990    Years since quitting: 34.9   Smokeless tobacco: Never   Tobacco comments:    minimal smoking history  Vaping Use   Vaping status: Never Used  Substance and Sexual Activity   Alcohol use: No    Alcohol/week: 0.0 standard drinks of alcohol   Drug use: No   Sexual activity: Not Currently  Other Topics Concern   Not on file  Social History Narrative   Lives with husband and 2 cats.  2 grown children, 5 grandchildren.  Occupation: retired, worked in [employer]   Activity: no regular exercise.  Does walk in summer   Diet: fruits/vegetables daily, good water.   Social Determinants of Health   Financial Resource Strain: Low Risk  (07/13/2022)   Overall Financial Resource Strain (CARDIA)    Difficulty of Paying Living Expenses: Not very hard  Food Insecurity: No Food Insecurity (07/13/2022)   Hunger Vital Sign    Worried About Running Out of Food in the Last Year: Never true    Ran Out of Food in the Last Year: Never true  Transportation Needs: No Transportation Needs (07/13/2022)   PRAPARE - Administrator, Civil Service (Medical): No    Lack of Transportation (Non-Medical): No  Physical Activity: Inactive (07/12/2021)   Exercise Vital Sign    Days of Exercise per Week: 0 days    Minutes of Exercise per Session: 0 min  Stress: No Stress Concern Present (07/13/2022)   [employer] of Occupational Health - Occupational Stress Questionnaire    Feeling of Stress : Not at all  Social Connections: Moderately Integrated (07/13/2022)   Social Connection and Isolation Panel [NHANES]    Frequency of Communication with Friends and Family: More than three times a week    Frequency of Social Gatherings with Friends and Family: More than three times a week    Attends  Religious Services: More than 4 times per year    Active Member of [employer] or Organizations: No    Attends Engineer, structural: Never    Marital Status: Married     Family History: The patient's family history includes CAD (age of onset: 44) in her father; CAD (age of onset: 56) in her mother; Diabetes in her father; Hyperlipidemia in her sister; Hypertension in her mother; Memory loss in her paternal aunt; Parkinson's disease in her father and sister. There is no history of Cancer, Stroke, Colon cancer, Breast cancer, Colon polyps, Esophageal cancer, Stomach cancer, or Rectal cancer.  ROS:   Please see the history of present illness.     All other systems reviewed and are negative.  EKGs/Labs/Other Studies Reviewed:    The following studies were reviewed today:   Echo 2022 1. Left ventricular ejection fraction, by estimation, is 60 to 65%. Left  ventricular ejection fraction by 3D volume is 61 %. The left ventricle has  normal function. The left ventricle has no regional wall motion  abnormalities. Left ventricular diastolic   parameters are consistent with Grade I diastolic dysfunction (impaired  relaxation).   2. Right ventricular systolic function is normal. The right ventricular  size is normal. There is normal pulmonary artery systolic pressure. The  estimated right ventricular systolic pressure is 30.0 mmHg.   3. Left atrial size was severely dilated.   4. Right atrial size was mild to moderately dilated.   5. The mitral valve is grossly normal. Trivial mitral valve  regurgitation. No evidence of mitral stenosis.   6. The aortic valve is tricuspid. Aortic valve regurgitation is not  visualized. No aortic stenosis is present.   7. The inferior vena cava is normal in size with greater than 50%  respiratory variability, suggesting right atrial pressure of 3 mmHg.   Comparison(s): Changes from prior study are noted. EF has improved to  60-65%. NSR has replaced Afib.    EKG:  EKG is not ordered today.    Recent Labs: 05/29/2023: ALT 19 06/05/2023: Hemoglobin 13.2; Platelets 254.0 06/12/2023: BUN 38; Creatinine, Ser 2.24; Potassium 4.1;  Sodium 136; TSH 1.01  Recent Lipid Panel    Component Value Date/Time   CHOL 112 05/24/2022 1114   CHOL 215 (H) 03/24/2021 1047   TRIG 124 05/24/2022 1114   HDL 50 05/24/2022 1114   HDL 52 03/24/2021 1047   CHOLHDL 2.2 05/24/2022 1114   VLDL 25 05/24/2022 1114   LDLCALC 37 05/24/2022 1114   LDLCALC 132 (H) 03/24/2021 1047   LDLDIRECT 132.0 03/18/2019 0916    Physical Exam:    VS:  BP 121/60 (BP Location: Left Arm, Patient Position: Sitting, Cuff Size: Normal)   Pulse 71   Ht 5\' 4"  (1.626 m)   Wt 131 lb 9.6 oz (59.7 kg)   SpO2 97%   BMI 22.59 kg/m     Wt Readings from Last 3 Encounters:  06/26/23 131 lb 9.6 oz (59.7 kg)  06/12/23 132 lb 4 oz (60 kg)  06/05/23 131 lb (59.4 kg)     GEN:  Well nourished, well developed in no acute distress HEENT: Normal NECK: No JVD; No carotid bruits LYMPHATICS: No lymphadenopathy CARDIAC: RRR, no murmurs, rubs, gallops RESPIRATORY:  Clear to auscultation without rales, wheezing or rhonchi  ABDOMEN: Soft, non-tender, non-distended MUSCULOSKELETAL:  No edema; No deformity  SKIN: Warm and dry NEUROLOGIC:  Alert and oriented x 3 PSYCHIATRIC:  Normal affect   ASSESSMENT:    1. Persistent atrial fibrillation (HCC)   2. Chronic systolic heart failure (HCC)   3. Nonischemic cardiomyopathy (HCC)   4. Chronic kidney disease, unspecified CKD stage   5. Essential hypertension   6. Hyperlipidemia, mixed    PLAN:    In order of problems listed above:  Persistent A-fib Patient is on Tikosyn 125 mcg twice daily followed by EP.  Continue Eliquis 5 mg twice daily for stroke prophylaxis.  I will have patient see Dr. Lalla Brothers in 3 months.  Chronic HFrEF Nonischemic cardiomyopathy Patient has a history of nonischemic cardiomyopathy with improved EF.  She appears  euvolemic on exam.  Losartan previously held for AKI.  Given renal function, we will wait on GDT M.  She may be able to tolerate SGLT2i.   CKD with worsening renal function Losartan stopped at the last visit with initial improvement and kidney function, but it subsequently worsened with serum creatinine 2.24, BUN 38, GFR 20.85.  PCP has referred the patient to nephrology, we will provide her with this phone number.  Hypertension Losartan previously held as above.  Blood pressure today good at 121/60.  Hyperlipidemia LDL 37 in 2023.  Continue Repatha and Crestor.  Disposition: Follow up in 6 month(s) with MD/APP   Signed, Karey Suthers David Stall, PA-C  06/26/2023 12:46 PM    [facility]

## 2023-07-04 ENCOUNTER — Other Ambulatory Visit: Payer: Self-pay | Admitting: Nephrology

## 2023-07-04 DIAGNOSIS — N1832 Chronic kidney disease, stage 3b: Secondary | ICD-10-CM

## 2023-07-04 DIAGNOSIS — I10 Essential (primary) hypertension: Secondary | ICD-10-CM | POA: Diagnosis not present

## 2023-07-04 DIAGNOSIS — N179 Acute kidney failure, unspecified: Secondary | ICD-10-CM

## 2023-07-11 DIAGNOSIS — I10 Essential (primary) hypertension: Secondary | ICD-10-CM | POA: Diagnosis not present

## 2023-07-11 DIAGNOSIS — N179 Acute kidney failure, unspecified: Secondary | ICD-10-CM | POA: Diagnosis not present

## 2023-07-11 DIAGNOSIS — N1832 Chronic kidney disease, stage 3b: Secondary | ICD-10-CM | POA: Diagnosis not present

## 2023-07-15 ENCOUNTER — Ambulatory Visit (INDEPENDENT_AMBULATORY_CARE_PROVIDER_SITE_OTHER): Payer: Medicare Other

## 2023-07-15 VITALS — Ht 64.0 in | Wt 131.0 lb

## 2023-07-15 DIAGNOSIS — N179 Acute kidney failure, unspecified: Secondary | ICD-10-CM | POA: Diagnosis not present

## 2023-07-15 DIAGNOSIS — I10 Essential (primary) hypertension: Secondary | ICD-10-CM | POA: Diagnosis not present

## 2023-07-15 DIAGNOSIS — N1832 Chronic kidney disease, stage 3b: Secondary | ICD-10-CM | POA: Diagnosis not present

## 2023-07-15 DIAGNOSIS — Z0000 Encounter for general adult medical examination without abnormal findings: Secondary | ICD-10-CM

## 2023-07-15 NOTE — Progress Notes (Signed)
Subjective:   Ann White is a 77 y.o. female who presents for Medicare Annual (Subsequent) preventive examination.  Visit Complete: Virtual I connected with  Ann White on 07/15/23 by a audio enabled telemedicine application and verified that I am speaking with the correct person using two identifiers.  Patient Location: Home  Provider Location: Office/Clinic  I discussed the limitations of evaluation and management by telemedicine. The patient expressed understanding and agreed to proceed.  Vital Signs: Because this visit was a virtual/telehealth visit, some criteria may be missing or patient reported. Any vitals not documented were not able to be obtained and vitals that have been documented are patient reported.  Patient Medicare AWV questionnaire was completed by the patient on (not done); I have confirmed that all information answered by patient is correct and no changes since this date.  Cardiac Risk Factors include: advanced age (>65men, >78 women);hypertension;dyslipidemia;sedentary lifestyle    Objective:    Today's Vitals   07/15/23 1521  Weight: 131 lb (59.4 kg)  Height: 5\' 4"  (1.626 m)   Body mass index is 22.49 kg/m.     07/15/2023    3:32 PM 07/13/2022   10:31 AM 06/01/2022    7:32 AM 10/03/2021   11:00 AM 09/29/2021   10:14 AM 07/12/2021    9:46 AM 08/08/2020    3:22 PM  Advanced Directives  Does Patient Have a Medical Advance Directive? Yes Yes No No No No No  Type of Estate agent of [place];Living will Healthcare Power of [place];Living will       Does patient want to make changes to medical advance directive?  No - Patient declined    Yes (MAU/Ambulatory/Procedural Areas - Information given)   Copy of Healthcare Power of Attorney in Chart? Yes - validated most recent copy scanned in chart (See row information) No - copy requested       Would patient like information on creating a medical advance directive?   No -  Guardian declined No - Patient declined No - Patient declined  Yes (Inpatient - patient requests chaplain consult to create a medical advance directive)    Current Medications (verified) Outpatient Encounter Medications as of 07/15/2023  Medication Sig   acetaminophen (TYLENOL) 500 MG tablet Take 500-1,000 mg by mouth every 6 (six) hours as needed (knee pain.).   amoxicillin-clavulanate (AUGMENTIN) 500-125 MG tablet Take 1 tablet by mouth in the morning and at bedtime.   apixaban (ELIQUIS) 2.5 MG TABS tablet Take 1 tablet (2.5 mg total) by mouth 2 (two) times daily.   cetirizine (ZYRTEC) 10 MG tablet Take 10 mg by mouth daily as needed for allergies.   Cholecalciferol (VITAMIN D3) 1000 UNITS CAPS Take 1 capsule (1,000 Units total) by mouth daily.   clonazePAM (KLONOPIN) 0.5 MG tablet Take 0.5 tablets (0.25 mg total) by mouth at bedtime as needed for anxiety (sleep).   dofetilide (TIKOSYN) 125 MCG capsule TAKE 1 CAPSULE BY MOUTH TWICE A DAY   Evolocumab (REPATHA SURECLICK) 140 MG/ML SOAJ Inject 140 mg into the skin every 14 (fourteen) days.   famotidine (PEPCID) 20 MG tablet Take 1 tablet (20 mg total) by mouth 2 (two) times daily.   Multiple Vitamin (MULTIVITAMIN WITH MINERALS) TABS tablet Take 1 tablet by mouth daily.   oxymetazoline (AFRIN) 0.05 % nasal spray Place 1 spray into both nostrils 2 (two) times daily as needed for congestion.   pantoprazole (PROTONIX) 40 MG tablet Take 1 tablet (40 mg total) by mouth  daily.   rosuvastatin (CRESTOR) 5 MG tablet TAKE 1 TABLET BY MOUTH THREE TIMES A WEEK   sertraline (ZOLOFT) 50 MG tablet Take 1 tablet (50 mg total) by mouth daily.   traMADol (ULTRAM) 50 MG tablet TAKE 1 TABLET BY MOUTH TWICE DAILY AS NEEDED FOR MODERATE PAIN   vitamin B-12 (CYANOCOBALAMIN) 500 MCG tablet Take 1 tablet (500 mcg total) by mouth once a week.   No facility-administered encounter medications on file as of 07/15/2023.    Allergies (verified) Sulfa antibiotics,  Nexletol [bempedoic acid], Tricor [fenofibrate], Zetia [ezetimibe], Contrast media [iodinated contrast media], Lipitor [atorvastatin], and Pravastatin   History: Past Medical History:  Diagnosis Date   AAA (abdominal aortic aneurysm) (HCC)    Allergy    Anxiety    Arthritis    Carotid stenosis    a. 02/2016 Carotid U/S: Bilateral 40-59%; b. 03/2017 Carotid U/S: RICA 40-59%, LICA 1-39%; c. 04/2019 Carotid U/S: bilat 40-59% ICA dzs.   Cervical spondylosis    s/p spine injections   CKD (chronic kidney disease), stage III (HCC)    Depression    DVT (deep venous thrombosis) (HCC)    RLE DVT 05/27/15   Dysrhythmia    afib   Ectatic abdominal aorta (HCC) 2016   rpt [place] 5 yrs   GERD (gastroesophageal reflux disease)    History of chicken pox    History of diverticulitis of colon    HLD (hyperlipidemia)    HTN (hypertension)    Irritable bowel syndrome with constipation    Lichen sclerosus et atrophicus    NICM (nonischemic cardiomyopathy) (HCC)    a. 04/2017 Echo: EF 35-40%, diff HK; b. 05/2017 MV: EF 56%, no ischemia/infarct; c. 04/2018 Echo: EF 55-60%, no rwma, Gr2 DD; d. 03/2020 Echo: EF 40-45%, nl PASP, mod dil LA, mild to mod dil RA, Triv MR.   Obesity    Persistent atrial fibrillation (HCC)    a. Dx 03/2020. CHA2DS2VASc = 5-->xarelto 15mg  daily.   Premature atrial contraction    a. 04/2017 48hr Holter: Predominant rhythm - sinus. Rare PVC's, freq PAC's with freq atrial runs up to 34 beats-->metoprolol started.   Renal insufficiency    Seasonal allergic rhinitis    Past Surgical History:  Procedure Laterality Date   ABDOMINAL AORTIC ANEURYSM REPAIR  09/2021   Randie Heinz   ABDOMINAL AORTIC ENDOVASCULAR STENT GRAFT  10/03/2021   Procedure: ABDOMINAL AORTIC ENDOVASCULAR STENT GRAFT;  Surgeon: Maeola Harman, MD;  Location: [facility] OR;  Service: Vascular;;   bladder tack  1990s   BREAST BIOPSY Right 03/05/2012    benign   CARDIOVERSION N/A 05/17/2020   Procedure: CARDIOVERSION;   Surgeon: Yvonne Kendall, MD;  Location: [facility] ORS;  Service: Cardiovascular;  Laterality: N/A;   CARPAL TUNNEL RELEASE  2004, 2013   bilateral   CHOLECYSTECTOMY  1990s   COLONOSCOPY  2002   COLONOSCOPY  06/2013   mod diverticulosis, 3 tubular adenomas, int hem rpt 5 yrs Russella Dar)   COLONOSCOPY  06/2018   TA,c olonic angiodysplastic lesion, mod diverticulosis, rpt 5 yrs Russella Dar)   DEXA  07/2011   normal, T score -0.3   ESOPHAGOGASTRODUODENOSCOPY ENDOSCOPY  2004   normal ([facility])   TOTAL ABDOMINAL HYSTERECTOMY W/ BILATERAL SALPINGOOPHORECTOMY  1990s   complete, dysmenorrhea and fibroids   ULTRASOUND GUIDANCE FOR VASCULAR ACCESS  10/03/2021   Procedure: ULTRASOUND GUIDANCE FOR VASCULAR ACCESS;  Surgeon: Maeola Harman, MD;  Location: [facility] OR;  Service: Vascular;;   Family History  Problem Relation Age  of Onset   CAD Mother 46       MI   Hypertension Mother    Parkinson's disease Father    Diabetes Father    CAD Father 73       MI   Parkinson's disease Sister    Hyperlipidemia Sister    Memory loss Paternal Aunt    Cancer Neg Hx    Stroke Neg Hx    Colon cancer Neg Hx    Breast cancer Neg Hx    Colon polyps Neg Hx    Esophageal cancer Neg Hx    Stomach cancer Neg Hx    Rectal cancer Neg Hx    Social History   Socioeconomic History   Marital status: Married    Spouse name: Freddie   Number of children: 2   Years of education: Not on file   Highest education level: Not on file  Occupational History   Occupation: retired   Tobacco Use   Smoking status: Former    Current packs/day: 0.00    Average packs/day: 0.3 packs/day for 3.0 years (0.8 ttl pk-yrs)    Types: Cigarettes    Start date: 26    Quit date: 1990    Years since quitting: 35.0   Smokeless tobacco: Never   Tobacco comments:    minimal smoking history  Vaping Use   Vaping status: Never Used  Substance and Sexual Activity   Alcohol use: No    Alcohol/week: 0.0 standard drinks of alcohol   Drug  use: No   Sexual activity: Not Currently  Other Topics Concern   Not on file  Social History Narrative   Lives with husband and 2 cats.  2 grown children, 5 grandchildren.   Occupation: retired, worked in [employer]   Activity: no regular exercise.  Does walk in summer   Diet: fruits/vegetables daily, good water.   Social Drivers of Corporate investment banker Strain: Low Risk  (07/15/2023)   Overall Financial Resource Strain (CARDIA)    Difficulty of Paying Living Expenses: Not very hard  Food Insecurity: No Food Insecurity (07/15/2023)   Hunger Vital Sign    Worried About Running Out of Food in the Last Year: Never true    Ran Out of Food in the Last Year: Never true  Transportation Needs: No Transportation Needs (07/15/2023)   PRAPARE - Administrator, Civil Service (Medical): No    Lack of Transportation (Non-Medical): No  Physical Activity: Inactive (07/15/2023)   Exercise Vital Sign    Days of Exercise per Week: 0 days    Minutes of Exercise per Session: 0 min  Stress: No Stress Concern Present (07/15/2023)   [employer] of Occupational Health - Occupational Stress Questionnaire    Feeling of Stress : Not at all  Social Connections: Moderately Integrated (07/15/2023)   Social Connection and Isolation Panel [NHANES]    Frequency of Communication with Friends and Family: Three times a week    Frequency of Social Gatherings with Friends and Family: Three times a week    Attends Religious Services: More than 4 times per year    Active Member of Clubs or Organizations: No    Attends Banker Meetings: Never    Marital Status: Married    Tobacco Counseling Counseling given: Not Answered Tobacco comments: minimal smoking history   Clinical Intake:  Pre-visit preparation completed: No  Pain : No/denies pain     BMI - recorded: 22.49 Nutritional Status: BMI of  19-24  Normal Nutritional Risks: None Diabetes: No  How often do you need to have  someone help you when you read instructions, pamphlets, or other written materials from your doctor or pharmacy?: 1 - Never  Interpreter Needed?: No  Comments: lives with husband Information entered by :: B.Temekia Caskey,LPN   Activities of Daily Living    07/15/2023    3:30 PM  In your present state of health, do you have any difficulty performing the following activities:  Hearing? 0  Vision? 0  Difficulty concentrating or making decisions? 1  Walking or climbing stairs? 0  Dressing or bathing? 0  Doing errands, shopping? 0  Preparing Food and eating ? N  Using the Toilet? N  In the past six months, have you accidently leaked urine? N  Do you have problems with loss of bowel control? N  Managing your Medications? N  Managing your Finances? N  Housekeeping or managing your Housekeeping? N    Patient Care Team: Eustaquio Boyden, MD as PCP - General (Family Medicine) End, Cristal Deer, MD as PCP - Cardiology (Cardiology) Lanier Prude, MD as PCP - Electrophysiology (Cardiology) Shirlean Kelly, MD as Consulting Physician (Neurosurgery) Sherre Lain, OD as Referring Physician (Optometry)  Indicate any recent Medical Services you may have received from other than [facility] providers in the past year (date may be approximate).     Assessment:   This is a routine wellness examination for [facility].  Hearing/Vision screen Hearing Screening - Comments:: Pt says her hearing is good Vision Screening - Comments:: Pt says sees well w/glasses Dr Linton Ham   Goals Addressed             This Visit's Progress    DIET - INCREASE WATER INTAKE   Not on track    Starting 03/06/2018, I will attempt to drink at least 6-8 glasses of water daily.      COMPLETED: Maintain healthy lifestyle   On track    Stay hydrated Stay active Healthy diet     COMPLETED: Patient Stated   On track    03/25/2020, I will maintain and continue medications as prescribed.      Patient Stated   Not  on track    Would like to drink more water       Depression Screen    07/15/2023    3:28 PM 06/12/2023    9:59 AM 05/31/2023    3:27 PM 04/15/2023   11:57 AM 04/08/2023   12:18 PM 03/22/2023   12:00 PM 03/01/2023   12:05 PM  PHQ 2/9 Scores  PHQ - 2 Score 0 1 0 0 0 0 0  PHQ- 9 Score  2  1 1 4 2     Fall Risk    07/15/2023    3:25 PM 05/31/2023    3:27 PM 04/15/2023   11:57 AM 03/01/2023   12:05 PM 02/05/2023    8:45 AM  Fall Risk   Falls in the past year? 0 0 0 0 0  Number falls in past yr: 0      Injury with Fall? 0      Risk for fall due to : No Fall Risks      Follow up Education provided;Falls prevention discussed        MEDICARE RISK AT HOME: Medicare Risk at Home Any stairs in or around the home?: Yes (ramp in front) If so, are there any without handrails?: Yes Home free of loose throw rugs in walkways, pet beds, electrical  cords, etc?: Yes Adequate lighting in your home to reduce risk of falls?: Yes Life alert?: No Use of a cane, walker or w/c?: No Grab bars in the bathroom?: Yes Shower chair or bench in shower?: Yes Elevated toilet seat or a handicapped toilet?: Yes  TIMED UP AND GO:  Was the test performed?  No    Cognitive Function:    05/14/2023    3:00 PM 03/25/2020   10:13 AM 03/06/2018    9:45 AM 02/26/2017    1:48 PM 02/21/2016    8:49 AM  MMSE - Mini Mental State Exam  Orientation to time 4 5 5 5 5   Orientation to Place 4 5 5 5 5   Registration 3 3 3 3 3   Attention/ Calculation 1 5 0 0 0  Recall 0 3 3 3 3   Language- name 2 objects 2  0 0 0  Language- repeat 1 1 1 1 1   Language- follow 3 step command 3  3 3 3   Language- read & follow direction 1  0 0 0  Write a sentence 1  0 0 0  Copy design 1  0 0 0  Total score 21  20 20 20         07/15/2023    3:37 PM 07/13/2022   10:33 AM  6CIT Screen  What Year? 0 points 0 points  What month? 0 points 0 points  What time? 0 points 0 points  Count back from 20 0 points   Months in reverse 0 points    Repeat phrase 0 points   Total Score 0 points     Immunizations Immunization History  Administered Date(s) Administered   Fluad Quad(high Dose 65+) 03/25/2019, 03/30/2020, 05/22/2022   Influenza, High Dose Seasonal PF 04/10/2018, 03/24/2021   Influenza,inj,Quad PF,6+ Mos 03/16/2015   Influenza-Unspecified 04/15/2013, 04/15/2014, 04/16/2016, 04/15/2017   PFIZER Comirnaty(Gray Top)Covid-19 Tri-Sucrose Vaccine 05/22/2022   PFIZER(Purple Top)SARS-COV-2 Vaccination 08/02/2019, 08/22/2019, 05/02/2020   PNEUMOCOCCAL CONJUGATE-20 11/27/2021   Pfizer Covid-19 Vaccine Bivalent Booster 60yrs & up 04/27/2021   Pneumococcal Conjugate-13 08/28/2013   Pneumococcal Polysaccharide-23 08/27/2012   Respiratory Syncytial Virus Vaccine,Recomb Aduvanted(Arexvy) 05/22/2022   Tdap 08/27/2012, 02/06/2022   Zoster Recombinant(Shingrix) 11/27/2021, 02/06/2022   Zoster, Live 07/16/2010    TDAP status: Up to date  Flu Vaccine status: Due, Education has been provided regarding the importance of this vaccine. Advised may receive this vaccine at local pharmacy or Health Dept. Aware to provide a copy of the vaccination record if obtained from local pharmacy or Health Dept. Verbalized acceptance and understanding.  Pneumococcal vaccine status: Up to date  Covid-19 vaccine status: Completed vaccines  Qualifies for Shingles Vaccine? Yes   Zostavax completed Yes   Shingrix Completed?: Yes  Screening Tests Health Maintenance  Topic Date Due   COVID-19 Vaccine (6 - 2024-25 season) 03/17/2023   Colonoscopy  06/19/2023   INFLUENZA VACCINE  10/14/2023 (Originally 02/14/2023)   MAMMOGRAM  01/07/2024   Medicare Annual Wellness (AWV)  07/14/2024   DTaP/Tdap/Td (3 - Td or Tdap) 02/07/2032   Pneumonia Vaccine 1+ Years old  Completed   DEXA SCAN  Completed   Hepatitis C Screening  Completed   Zoster Vaccines- Shingrix  Completed   HPV VACCINES  Aged Out    Health Maintenance  Health Maintenance Due  Topic  Date Due   COVID-19 Vaccine (6 - 2024-25 season) 03/17/2023   Colonoscopy  06/19/2023    Colorectal cancer screening: No longer required.   Mammogram status: No longer  required due to age.  Bone Density status: Completed 12/25/2021. Results reflect: Bone density results: NORMAL. Repeat every 5 years.  Lung Cancer Screening: (Low Dose CT Chest recommended if Age 51-80 years, 20 pack-year currently smoking OR have quit w/in 15years.) does not qualify.   Lung Cancer Screening Referral: no  Additional Screening:  Hepatitis C Screening: does not qualify; Completed 02/21/2016  Vision Screening: Recommended annual ophthalmology exams for early detection of glaucoma and other disorders of the eye. Is the patient up to date with their annual eye exam?  Yes  Who is the provider or what is the name of the office in which the patient attends annual eye exams? Dr Precious Bard If pt is not established with a provider, would they like to be referred to a provider to establish care? No .   Dental Screening: Recommended annual dental exams for proper oral hygiene  Diabetic Foot Exam: n/a  Community Resource Referral / Chronic Care Management: CRR required this visit?  No   CCM required this visit?  No     Plan:     I have personally reviewed and noted the following in the patient's chart:   Medical and social history Use of alcohol, tobacco or illicit drugs  Current medications and supplements including opioid prescriptions. Patient is not currently taking opioid prescriptions. Functional ability and status Nutritional status Physical activity Advanced directives List of other physicians Hospitalizations, surgeries, and ER visits in previous 12 months Vitals Screenings to include cognitive, depression, and falls Referrals and appointments  In addition, I have reviewed and discussed with patient certain preventive protocols, quality metrics, and best practice recommendations. A written  personalized care plan for preventive services as well as general preventive health recommendations were provided to patient.     Sue Lush, LPN   [id number]   After Visit Summary: (MyChart) Due to this being a telephonic visit, the after visit summary with patients personalized plan was offered to patient via MyChart   Nurse Notes: Pt says she is doing alright other than worried about what is wrong with her kidneys. I had to reiterate to pt her upcoming appts four times during the visit. She forgot or became confused about the information .

## 2023-07-15 NOTE — Patient Instructions (Signed)
Ms. Syverson , Thank you for taking time to come for your Medicare Wellness Visit. I appreciate your ongoing commitment to your health goals. Please review the following plan we discussed and let me know if I can assist you in the future.   Referrals/Orders/Follow-Ups/Clinician Recommendations: none  This is a list of the screening recommended for you and due dates:  Health Maintenance  Topic Date Due   COVID-19 Vaccine (6 - 2024-25 season) 03/17/2023   Colon Cancer Screening  06/19/2023   Flu Shot  10/14/2023*   Mammogram  01/07/2024   Medicare Annual Wellness Visit  07/14/2024   DTaP/Tdap/Td vaccine (3 - Td or Tdap) 02/07/2032   Pneumonia Vaccine  Completed   DEXA scan (bone density measurement)  Completed   Hepatitis C Screening  Completed   Zoster (Shingles) Vaccine  Completed   HPV Vaccine  Aged Out  *Topic was postponed. The date shown is not the original due date.    Advanced directives: (Copy Requested) Please bring a copy of your health care power of attorney and living will to the office to be added to your chart at your convenience.  Next Medicare Annual Wellness Visit scheduled for next year: Yes 07/15/2024 @ 3pm televisit

## 2023-07-18 ENCOUNTER — Ambulatory Visit
Admission: RE | Admit: 2023-07-18 | Discharge: 2023-07-18 | Disposition: A | Discharge status: Home / Self Care | Payer: Medicare Other | Source: Ambulatory Visit | Attending: Nephrology | Admitting: Nephrology

## 2023-07-18 DIAGNOSIS — N179 Acute kidney failure, unspecified: Secondary | ICD-10-CM | POA: Diagnosis not present

## 2023-07-18 DIAGNOSIS — N1832 Chronic kidney disease, stage 3b: Secondary | ICD-10-CM | POA: Diagnosis not present

## 2023-07-18 DIAGNOSIS — I10 Essential (primary) hypertension: Secondary | ICD-10-CM | POA: Insufficient documentation

## 2023-07-18 DIAGNOSIS — N271 Small kidney, bilateral: Secondary | ICD-10-CM | POA: Diagnosis not present

## 2023-08-01 DIAGNOSIS — N179 Acute kidney failure, unspecified: Secondary | ICD-10-CM | POA: Diagnosis not present

## 2023-08-01 DIAGNOSIS — I10 Essential (primary) hypertension: Secondary | ICD-10-CM | POA: Diagnosis not present

## 2023-08-01 DIAGNOSIS — N1832 Chronic kidney disease, stage 3b: Secondary | ICD-10-CM | POA: Diagnosis not present

## 2023-08-06 NOTE — Progress Notes (Deleted)
   Ann Murtagh T. Lilinoe Acklin, MD, CAQ Sports Medicine [facility] at [facility] [address] [place], [address]  Phone: [phone]  FAX: [phone]  Ann White - 78 y.o. female  MRN [id number]  Date of Birth: 1945-10-19  Date: 08/07/2023  PCP: Eustaquio Boyden, MD  Referral: Eustaquio Boyden, MD  No chief complaint on file.  Subjective:   Ann White is a 78 y.o. very pleasant female patient with There is no height or weight on file to calculate BMI. who presents with the following:  Patient is here to follow-up on chronic knee pain and osteoarthritis.  I am seeing her off-and-on for many years.  I have not done any recent x-rays, but even greater than 5 years ago she had significant radiographical evidence for osteoarthritis of the knee.    Review of Systems is noted in the HPI, as appropriate  Objective:   There were no vitals taken for this visit.  GEN: No acute distress; alert,appropriate. PULM: Breathing comfortably in no respiratory distress PSYCH: Normally interactive.   Laboratory and Imaging Data:  Assessment and Plan:   ***

## 2023-08-07 ENCOUNTER — Ambulatory Visit: Payer: Medicare Other | Admitting: Family Medicine

## 2023-08-07 DIAGNOSIS — M1711 Unilateral primary osteoarthritis, right knee: Secondary | ICD-10-CM

## 2023-08-12 DIAGNOSIS — I10 Essential (primary) hypertension: Secondary | ICD-10-CM | POA: Diagnosis not present

## 2023-08-12 DIAGNOSIS — L900 Lichen sclerosus et atrophicus: Secondary | ICD-10-CM | POA: Diagnosis not present

## 2023-08-12 DIAGNOSIS — N179 Acute kidney failure, unspecified: Secondary | ICD-10-CM | POA: Diagnosis not present

## 2023-08-12 DIAGNOSIS — N1832 Chronic kidney disease, stage 3b: Secondary | ICD-10-CM | POA: Diagnosis not present

## 2023-08-13 ENCOUNTER — Other Ambulatory Visit: Payer: Medicare Other

## 2023-08-13 ENCOUNTER — Other Ambulatory Visit: Payer: Self-pay | Admitting: Family Medicine

## 2023-08-13 DIAGNOSIS — E7849 Other hyperlipidemia: Secondary | ICD-10-CM

## 2023-08-13 DIAGNOSIS — R7303 Prediabetes: Secondary | ICD-10-CM

## 2023-08-14 ENCOUNTER — Other Ambulatory Visit (INDEPENDENT_AMBULATORY_CARE_PROVIDER_SITE_OTHER): Payer: Medicare Other

## 2023-08-14 ENCOUNTER — Other Ambulatory Visit: Payer: Medicare Other

## 2023-08-14 ENCOUNTER — Ambulatory Visit: Payer: Medicare Other | Admitting: Family Medicine

## 2023-08-14 DIAGNOSIS — E7849 Other hyperlipidemia: Secondary | ICD-10-CM | POA: Diagnosis not present

## 2023-08-14 DIAGNOSIS — R7303 Prediabetes: Secondary | ICD-10-CM | POA: Diagnosis not present

## 2023-08-14 LAB — LIPID PANEL
Cholesterol: 251 mg/dL — ABNORMAL HIGH (ref 0–200)
HDL: 55.9 mg/dL (ref >39.00)
LDL Cholesterol: 152 mg/dL — ABNORMAL HIGH (ref 0–99)
NonHDL: 195.45
Total CHOL/HDL Ratio: 4
Triglycerides: 219 mg/dL — ABNORMAL HIGH (ref 0.0–149.0)
VLDL: 43.8 mg/dL — ABNORMAL HIGH (ref 0.0–40.0)

## 2023-08-14 LAB — COMPREHENSIVE METABOLIC PANEL
ALT: 7 U/L (ref 0–35)
AST: 17 U/L (ref 0–37)
Albumin: 4.7 g/dL (ref 3.5–5.2)
Alkaline Phosphatase: 35 U/L — ABNORMAL LOW (ref 39–117)
BUN: 18 mg/dL (ref 6–23)
CO2: 27 meq/L (ref 19–32)
Calcium: 10 mg/dL (ref 8.4–10.5)
Chloride: 103 meq/L (ref 96–112)
Creatinine, Ser: 1.29 mg/dL — ABNORMAL HIGH (ref 0.40–1.20)
GFR: 40.37 mL/min — ABNORMAL LOW (ref >60.00)
Glucose, Bld: 85 mg/dL (ref 70–99)
Potassium: 4.7 meq/L (ref 3.5–5.1)
Sodium: 139 meq/L (ref 135–145)
Total Bilirubin: 0.5 mg/dL (ref 0.2–1.2)
Total Protein: 7.1 g/dL (ref 6.0–8.3)

## 2023-08-14 LAB — HEMOGLOBIN A1C: Hgb A1c MFr Bld: 6.1 % (ref 4.6–6.5)

## 2023-08-18 NOTE — Progress Notes (Unsigned)
   Shaleen Talamantez T. Tali Cleaves, MD, CAQ Sports Medicine [facility] at [facility] [address] [place], [address]  Phone: [phone]  FAX: [phone]  Ann White - 78 y.o. female  MRN [id number]  Date of Birth: 04/19/1946  Date: 08/19/2023  PCP: Eustaquio Boyden, MD  Referral: Eustaquio Boyden, MD  No chief complaint on file.  Subjective:   Ann White is a 78 y.o. very pleasant female patient with There is no height or weight on file to calculate BMI. who presents with the following:  Patient is here to follow-up on chronic knee pain and osteoarthritis.  I am seeing her off-and-on for many years.  I have not done any recent x-rays, but even greater than 5 years ago she had significant radiographical evidence for osteoarthritis of the knee.    Review of Systems is noted in the HPI, as appropriate  Objective:   There were no vitals taken for this visit.  GEN: No acute distress; alert,appropriate. PULM: Breathing comfortably in no respiratory distress PSYCH: Normally interactive.   Laboratory and Imaging Data:  Assessment and Plan:   ***

## 2023-08-19 ENCOUNTER — Ambulatory Visit (INDEPENDENT_AMBULATORY_CARE_PROVIDER_SITE_OTHER): Payer: Medicare Other | Admitting: Family Medicine

## 2023-08-19 ENCOUNTER — Encounter: Payer: Self-pay | Admitting: Family Medicine

## 2023-08-19 ENCOUNTER — Ambulatory Visit (INDEPENDENT_AMBULATORY_CARE_PROVIDER_SITE_OTHER)
Admission: RE | Admit: 2023-08-19 | Discharge: 2023-08-19 | Disposition: A | Discharge status: Home / Self Care | Payer: Medicare Other | Source: Ambulatory Visit | Attending: Family Medicine | Admitting: Family Medicine

## 2023-08-19 VITALS — BP 140/60 | HR 71 | Temp 98.4°F | Ht 64.0 in | Wt 127.2 lb

## 2023-08-19 DIAGNOSIS — M1711 Unilateral primary osteoarthritis, right knee: Secondary | ICD-10-CM

## 2023-08-19 DIAGNOSIS — M25461 Effusion, right knee: Secondary | ICD-10-CM | POA: Diagnosis not present

## 2023-08-19 MED ORDER — TRIAMCINOLONE ACETONIDE 40 MG/ML IJ SUSP
40.0000 mg | Freq: Once | INTRAMUSCULAR | Status: AC
  Administered 2023-08-19: 40 mg via INTRA_ARTICULAR

## 2023-08-20 ENCOUNTER — Ambulatory Visit: Payer: Medicare Other | Admitting: Family Medicine

## 2023-08-20 ENCOUNTER — Telehealth: Payer: Self-pay | Admitting: Family Medicine

## 2023-08-20 ENCOUNTER — Encounter: Payer: Self-pay | Admitting: Family Medicine

## 2023-08-20 VITALS — BP 128/66 | HR 70 | Temp 98.0°F | Ht 60.5 in | Wt 126.5 lb

## 2023-08-20 DIAGNOSIS — K581 Irritable bowel syndrome with constipation: Secondary | ICD-10-CM

## 2023-08-20 DIAGNOSIS — G3184 Mild cognitive impairment, so stated: Secondary | ICD-10-CM | POA: Diagnosis not present

## 2023-08-20 DIAGNOSIS — Z95828 Presence of other vascular implants and grafts: Secondary | ICD-10-CM

## 2023-08-20 DIAGNOSIS — F331 Major depressive disorder, recurrent, moderate: Secondary | ICD-10-CM | POA: Diagnosis not present

## 2023-08-20 DIAGNOSIS — Z7189 Other specified counseling: Secondary | ICD-10-CM

## 2023-08-20 DIAGNOSIS — F5104 Psychophysiologic insomnia: Secondary | ICD-10-CM | POA: Diagnosis not present

## 2023-08-20 DIAGNOSIS — Z0001 Encounter for general adult medical examination with abnormal findings: Secondary | ICD-10-CM

## 2023-08-20 DIAGNOSIS — Z23 Encounter for immunization: Secondary | ICD-10-CM | POA: Diagnosis not present

## 2023-08-20 DIAGNOSIS — I7143 Infrarenal abdominal aortic aneurysm, without rupture: Secondary | ICD-10-CM

## 2023-08-20 DIAGNOSIS — E7849 Other hyperlipidemia: Secondary | ICD-10-CM | POA: Diagnosis not present

## 2023-08-20 DIAGNOSIS — I428 Other cardiomyopathies: Secondary | ICD-10-CM

## 2023-08-20 DIAGNOSIS — D6869 Other thrombophilia: Secondary | ICD-10-CM | POA: Diagnosis not present

## 2023-08-20 DIAGNOSIS — K219 Gastro-esophageal reflux disease without esophagitis: Secondary | ICD-10-CM

## 2023-08-20 DIAGNOSIS — I4819 Other persistent atrial fibrillation: Secondary | ICD-10-CM | POA: Diagnosis not present

## 2023-08-20 DIAGNOSIS — I10 Essential (primary) hypertension: Secondary | ICD-10-CM

## 2023-08-20 DIAGNOSIS — I5022 Chronic systolic (congestive) heart failure: Secondary | ICD-10-CM | POA: Diagnosis not present

## 2023-08-20 DIAGNOSIS — N1832 Chronic kidney disease, stage 3b: Secondary | ICD-10-CM

## 2023-08-20 DIAGNOSIS — Z79899 Other long term (current) drug therapy: Secondary | ICD-10-CM

## 2023-08-20 DIAGNOSIS — I6523 Occlusion and stenosis of bilateral carotid arteries: Secondary | ICD-10-CM

## 2023-08-20 DIAGNOSIS — R7303 Prediabetes: Secondary | ICD-10-CM

## 2023-08-20 MED ORDER — CLONAZEPAM 0.5 MG PO TABS: 0.2500 mg | ORAL_TABLET | ORAL | 0 refills | Status: DC

## 2023-08-20 MED ORDER — SERTRALINE HCL 50 MG PO TABS: 50.0000 mg | ORAL_TABLET | Freq: Every day | ORAL | 4 refills | Status: DC

## 2023-08-20 MED ORDER — SERTRALINE HCL 25 MG PO TABS: 25.0000 mg | ORAL_TABLET | Freq: Every day | ORAL | 3 refills | Status: DC

## 2023-08-20 MED ORDER — PANTOPRAZOLE SODIUM 40 MG PO TBEC: 40.0000 mg | DELAYED_RELEASE_TABLET | Freq: Every day | ORAL | 4 refills | Status: DC

## 2023-08-20 MED ORDER — APIXABAN 2.5 MG PO TABS: 2.5000 mg | ORAL_TABLET | Freq: Two times a day (BID) | ORAL | 6 refills | Status: DC

## 2023-08-20 MED ORDER — DONEPEZIL HCL 5 MG PO TABS: 5.0000 mg | ORAL_TABLET | Freq: Every day | ORAL | 3 refills | Status: DC

## 2023-08-20 NOTE — Assessment & Plan Note (Signed)
Chronic, stable on lower sertraline 50mg  dose - will continue slow taper down to 25mg  daily for 1 month then consider trial off this.

## 2023-08-20 NOTE — Assessment & Plan Note (Signed)
Advanced directives: Husband then daughter are HCPOA. Has packet at home but needs to get notarized. Asked to bring me copy.  

## 2023-08-20 NOTE — Assessment & Plan Note (Addendum)
 Chronic, longterm on nightly klonopin  - last year we dropped dose to 0.25mg  nightly with good effect, she was previously unable to fully taper off - may have contributed to worsening dizziness/malaise.  Discussed retry slow taper - drop klonopin  dose to 0.25mg  every other night.

## 2023-08-20 NOTE — Telephone Encounter (Signed)
[name] notified as instructed by telephone.

## 2023-08-20 NOTE — Assessment & Plan Note (Signed)
GFR improving to 40 on retesting. Unclear cause of prior acute renal failure.  Appreciate nephrology care.

## 2023-08-20 NOTE — Assessment & Plan Note (Signed)
Appreciate [facility] care.  

## 2023-08-20 NOTE — Progress Notes (Signed)
 Ph: [phone] Fax: [phone]   Patient ID: Ann White, female    DOB: Mar 07, 1946, 78 y.o.   MRN: [id number]  This visit was conducted in person.  BP 128/66   Pulse 70   Temp 98 F (36.7 C) (Oral)   Ht 5' 0.5 (1.537 m)   Wt 126 lb 8 oz (57.4 kg)   SpO2 94%   BMI 24.30 kg/m    CC: CPE Subjective:   HPI: Ann White is a 78 y.o. female presenting on 08/20/2023 for Annual Exam (MCR prt 2 [AWV- 07/15/23]. Pt accompanied by husband, Ann White. )   Saw health advisor 07/15/2023 for medicare wellness visit. Note reviewed.   Latest CrCl 34.   No results found.  Flowsheet Row Clinical Support from 07/15/2023 in [facility] [place]  PHQ-2 Total Score 0          07/15/2023    3:25 PM 05/31/2023    3:27 PM 04/15/2023   11:57 AM 03/01/2023   12:05 PM 02/05/2023    8:45 AM  Fall Risk   Falls in the past year? 0 0 0 0 0  Number falls in past yr: 0      Injury with Fall? 0      Risk for fall due to : No Fall Risks      Follow up Education provided;Falls prevention discussed       Sees Dr Cindie for afib on eliquis  and tikosyn . Also sees Dr End for non ischemic CM and lipid clinic for familial hyperlipidemia - stable period on losartan  25mg . She is taking Repatha  q2 weeks for cholesterol control, as well as crestor  three times a week.    AAA s/p endovascular repair by Dr Sheree [facility] 2023.  Stable moderate BICA disease.   Worsening memory - husband notes she stays forgetful. They are interested in trial of donepezil .   Worsening kidney disease - established with nephrology Dr Lateef/Ann Hyler NP - stable CKD stage 4 with latest GFR 28.  No further UTI symptoms.   Worsening R knee osteoarthritis noted on imaging seeing sports medicine s/p steroid injection yesterday. This has significantly helped.    Post-inflammatory pulm fibrosis - noted in chart - pt denies history of this or respiratory symptoms, recent lung imaging overall normal.     Preventative: Colon cancer screening - colonoscopy 06/2013 mod diverticulosis, 3 tubular adenomas, int hem rpt 5 yrs [facility])  COLONOSCOPY 06/2018 - TA, colonic angiodysplastic lesion, mod diverticulosis, rpt 5 yrs [facility]ma) - decides to hold off for now. No blood in stool noted.  Well woman - with prior PCP Dr. Shevlin. S/p hysterectomy and bilat oophorectomy for benign reason 1990s.  [facility]  DEXA 2013 - WNL DEXA 12/2021 - T -0.8 L femur neck  Lung cancer screening - not eligible  Flu shot yearly COVID vaccine - Pfizer 07/2019, 08/2019, booster 04/2020, bivalent 04/2021, again 05/2022 RSV 05/2022 Pneumovax 2014. prevnar-13 08/2013 Tdap 2014 zostavax 2012.  Shingrix - 11/2021, 01/2022 Advanced directives: Husband then daughter Ann White) are HCPOA. Has packet at home but needs to get notarized. Asked to bring me copy. Seat belt use discussed  Sunscreen use discussed. No changing moles on skin.  Non smokers  EtOH - none  Dentist Q6 mo Eye exam yearly  Bowel - increasing constipation noted - she's tried stool softener.  Bladder - no incontinence    Lives with husband and 2 cats. 2  grown children, 5 grandchildren  Occupation: retired, worked in [employer]  Activity: regular walking Diet: fruits/vegetables daily, good water      Relevant past medical, surgical, family and social history reviewed and updated as indicated. Interim medical history since our last visit reviewed. Allergies and medications reviewed and updated. Outpatient Medications Prior to Visit  Medication Sig Dispense Refill   acetaminophen  (TYLENOL ) 500 MG tablet Take 500-1,000 mg by mouth every 6 (six) hours as needed (knee pain.).     cetirizine (ZYRTEC) 10 MG tablet Take 10 mg by mouth daily as needed for allergies.     Cholecalciferol  (VITAMIN D3) 1000 UNITS CAPS Take 1 capsule (1,000 Units total) by mouth daily.     dofetilide  (TIKOSYN ) 125 MCG capsule TAKE 1 CAPSULE BY MOUTH TWICE  A DAY 180 capsule 3   Evolocumab  (REPATHA  SURECLICK) 140 MG/ML SOAJ Inject 140 mg into the skin every 14 (fourteen) days. 6 mL 3   famotidine  (PEPCID ) 20 MG tablet Take 1 tablet (20 mg total) by mouth 2 (two) times daily.     Multiple Vitamin (MULTIVITAMIN WITH MINERALS) TABS tablet Take 1 tablet by mouth daily.     oxymetazoline  (AFRIN) 0.05 % nasal spray Place 1 spray into both nostrils 2 (two) times daily as needed for congestion.     rosuvastatin  (CRESTOR ) 5 MG tablet TAKE 1 TABLET BY MOUTH THREE TIMES A WEEK 45 tablet 2   traMADol  (ULTRAM ) 50 MG tablet TAKE 1 TABLET BY MOUTH TWICE DAILY AS NEEDED FOR MODERATE PAIN 30 tablet 2   vitamin B-12 (CYANOCOBALAMIN ) 500 MCG tablet Take 1 tablet (500 mcg total) by mouth once a week.     apixaban  (ELIQUIS ) 2.5 MG TABS tablet Take 1 tablet (2.5 mg total) by mouth 2 (two) times daily. 60 tablet 1   clonazePAM  (KLONOPIN ) 0.5 MG tablet Take 0.5 tablets (0.25 mg total) by mouth at bedtime as needed for anxiety (sleep). 30 tablet 0   pantoprazole  (PROTONIX ) 40 MG tablet Take 1 tablet (40 mg total) by mouth daily.     sertraline  (ZOLOFT ) 50 MG tablet Take 1 tablet (50 mg total) by mouth daily. 90 tablet 1   No facility-administered medications prior to visit.     Per HPI unless specifically indicated in ROS section below Review of Systems  Constitutional:  Negative for activity change, appetite change, chills, fatigue, fever and unexpected weight change.  HENT:  Negative for hearing loss.   Eyes:  Negative for visual disturbance.  Respiratory:  Negative for cough, chest tightness, shortness of breath and wheezing.   Cardiovascular:  Negative for chest pain, palpitations and leg swelling.  Gastrointestinal:  Positive for constipation. Negative for abdominal distention, abdominal pain, blood in stool, diarrhea, nausea and vomiting.  Genitourinary:  Negative for difficulty urinating and hematuria.  Musculoskeletal:  Negative for arthralgias, myalgias and  neck pain.  Skin:  Negative for rash.  Neurological:  Positive for headaches. Negative for dizziness, seizures and syncope.  Hematological:  Negative for adenopathy. Bruises/bleeds easily.  Psychiatric/Behavioral:  Negative for dysphoric mood. The patient is not nervous/anxious.     Objective:  BP 128/66   Pulse 70   Temp 98 F (36.7 C) (Oral)   Ht 5' 0.5 (1.537 m)   Wt 126 lb 8 oz (57.4 kg)   SpO2 94%   BMI 24.30 kg/m   Wt Readings from Last 3 Encounters:  08/20/23 126 lb 8 oz (57.4 kg)  08/19/23 127 lb 4 oz (57.7 kg)  07/15/23 131 lb (  59.4 kg)    Pulse Readings from Last 3 Encounters:  08/20/23 70  08/19/23 71  06/26/23 71      Physical Exam Vitals and nursing note reviewed.  Constitutional:      Appearance: Normal appearance. She is not ill-appearing.  HENT:     Head: Normocephalic and atraumatic.     Right Ear: Tympanic membrane, ear canal and external ear normal. There is no impacted cerumen.     Left Ear: Tympanic membrane, ear canal and external ear normal. There is no impacted cerumen.     Mouth/Throat:     Mouth: Mucous membranes are moist.     Pharynx: Oropharynx is clear. No oropharyngeal exudate or posterior oropharyngeal erythema.  Eyes:     General:        Right eye: No discharge.        Left eye: No discharge.     Extraocular Movements: Extraocular movements intact.     Conjunctiva/sclera: Conjunctivae normal.     Pupils: Pupils are equal, round, and reactive to light.  Neck:     Thyroid : No thyroid  mass or thyromegaly.     Vascular: No carotid bruit.  Cardiovascular:     Rate and Rhythm: Normal rate and regular rhythm.     Pulses: Normal pulses.     Heart sounds: Normal heart sounds. No murmur heard. Pulmonary:     Effort: Pulmonary effort is normal. No respiratory distress.     Breath sounds: Normal breath sounds. No wheezing, rhonchi or rales.  Abdominal:     General: Bowel sounds are normal. There is no distension.     Palpations: Abdomen  is soft. There is no mass.     Tenderness: There is no abdominal tenderness. There is no guarding or rebound.     Hernia: No hernia is present.  Musculoskeletal:     Cervical back: Normal range of motion and neck supple. No rigidity.     Right lower leg: No edema.     Left lower leg: No edema.  Lymphadenopathy:     Cervical: No cervical adenopathy.  Skin:    General: Skin is warm and dry.     Findings: No rash.  Neurological:     General: No focal deficit present.     Mental Status: She is alert. Mental status is at baseline.  Psychiatric:        Mood and Affect: Mood normal.        Behavior: Behavior normal.       Results for orders placed or performed in visit on 08/14/23  Comprehensive metabolic panel   Collection Time: 08/14/23 11:47 AM  Result Value Ref Range   Sodium 139 135 - 145 mEq/L   Potassium 4.7 3.5 - 5.1 mEq/L   Chloride 103 96 - 112 mEq/L   CO2 27 19 - 32 mEq/L   Glucose, Bld 85 70 - 99 mg/dL   BUN 18 6 - 23 mg/dL   Creatinine, Ser 8.70 (H) 0.40 - 1.20 mg/dL   Total Bilirubin 0.5 0.2 - 1.2 mg/dL   Alkaline Phosphatase 35 (L) 39 - 117 U/L   AST 17 0 - 37 U/L   ALT 7 0 - 35 U/L   Total Protein 7.1 6.0 - 8.3 g/dL   Albumin 4.7 3.5 - 5.2 g/dL   GFR 59.62 (L) >39.99 mL/min   Calcium  10.0 8.4 - 10.5 mg/dL  Hemoglobin J8r   Collection Time: 08/14/23 11:47 AM  Result Value Ref Range  Hgb A1c MFr Bld 6.1 4.6 - 6.5 %  Lipid panel   Collection Time: 08/14/23 11:47 AM  Result Value Ref Range   Cholesterol 251 (H) 0 - 200 mg/dL   Triglycerides 780.9 (H) 0.0 - 149.0 mg/dL   HDL 44.09 >60.99 mg/dL   VLDL 56.1 (H) 0.0 - 59.9 mg/dL   LDL Cholesterol 847 (H) 0 - 99 mg/dL   Total CHOL/HDL Ratio 4    NonHDL 195.45    Lab Results  Component Value Date   WBC 7.0 06/05/2023   HGB 13.2 06/05/2023   HCT 39.6 06/05/2023   MCV 90.7 06/05/2023   PLT 254.0 06/05/2023     Assessment & Plan:   Problem List Items Addressed This Visit     Advanced care  planning/counseling discussion (Chronic)   Advanced directives: Husband then daughter are HCPOA. Has packet at home but needs to get notarized. Asked to bring me copy.       Encounter for general adult medical examination with abnormal findings - Primary (Chronic)   Preventative protocols reviewed and updated unless pt declined. Discussed healthy diet and lifestyle.       Irritable bowel syndrome with constipation   Notes worsening constipation without abd pain - will start daily colace with option for PRN miralax  if needed. Consider fiber supplementation.       Relevant Medications   pantoprazole  (PROTONIX ) 40 MG tablet   Essential hypertension   Chronic, stable off antihypertensive.       Relevant Medications   apixaban  (ELIQUIS ) 2.5 MG TABS tablet   GERD (gastroesophageal reflux disease)   Chronic, stable period on once daily pantoprazole  and pepcid  20mg  BID.      Relevant Medications   pantoprazole  (PROTONIX ) 40 MG tablet   MDD (major depressive disorder), recurrent episode, moderate (HCC)   Chronic, stable on lower sertraline  50mg  dose - will continue slow taper down to 25mg  daily for 1 month then consider trial off this.       Relevant Medications   sertraline  (ZOLOFT ) 25 MG tablet   Familial combined hyperlipidemia   Chronic, marked deterioration in cholesterol control with LDL 150s of unclear cause as they report compliance with Crestor  5mg  MWF and Repatha  injection every 2 weeks administered at home. Goal LDL <70. I asked them to verify lipid regimen at home and let me know for further titration depending on what they're taking. The 10-year ASCVD risk score (Arnett DK, et al., 2019) is: 18%   Values used to calculate the score:     Age: 67 years     Sex: Female     Is Non-Hispanic African American: No     Diabetic: No     Tobacco smoker: No     Systolic Blood Pressure: 128 mmHg     Is BP treated: No     HDL Cholesterol: 55.9 mg/dL     Total Cholesterol: 251 mg/dL        Relevant Medications   apixaban  (ELIQUIS ) 2.5 MG TABS tablet   Prediabetes   A1c remains stable off medication.       Chronic kidney disease, stage 3b (HCC)   GFR improving to 40 on retesting. Unclear cause of prior acute renal failure.  Appreciate nephrology care.       Infrarenal abdominal aortic aneurysm (AAA) without rupture (HCC)   Appreciate [facility] care      Relevant Medications   apixaban  (ELIQUIS ) 2.5 MG TABS tablet   Bilateral carotid artery stenosis  Will discuss repeat carotid ultrasound at next visit and recent worsening cholesterol control.      Relevant Medications   apixaban  (ELIQUIS ) 2.5 MG TABS tablet   Nonischemic cardiomyopathy [facility])   Appreciate cardiology care.      Relevant Medications   apixaban  (ELIQUIS ) 2.5 MG TABS tablet   Persistent atrial fibrillation (HCC)   Continues renally dosed Tikosyn  and Eliquis  2.5 mg twice daily.      Relevant Medications   apixaban  (ELIQUIS ) 2.5 MG TABS tablet   Chronic insomnia   Chronic, longterm on nightly klonopin  - last year we dropped dose to 0.25mg  nightly with good effect, she was previously unable to fully taper off - may have contributed to worsening dizziness/malaise.  Discussed retry slow taper - drop klonopin  dose to 0.25mg  every other night.       Relevant Medications   clonazePAM  (KLONOPIN ) 0.5 MG tablet   Polypharmacy   Chronic HFrEF (heart failure with reduced ejection fraction) (HCC)   Seems euvolemic. Remaining off losartan  25mg .      Relevant Medications   apixaban  (ELIQUIS ) 2.5 MG TABS tablet   Secondary hypercoagulable state (HCC)   Continue Eliquis  in A-fib history      History of repair of aneurysm of abdominal aorta using endovascular stent graft   Amnestic MCI (mild cognitive impairment with memory loss)   Chronic, ongoing difficulty. Brain imaging with MRI in September of last year showed mild to moderate generalized atrophy and moderate chronic small vessel ischemic  changes with prominent perivascular space at left basal ganglia. History of Namenda  intolerance when tried April 2024. Again discussed benefits versus risks of acetylcholinesterase inhibitor donepezil  including cardiac conduction abnormalities, syncope, GI side effects.  They desire to trial donepezil -will send in low-dose 5 mg nightly.  Reassess at 2-21-month follow-up visit.      Other Visit Diagnoses       Encounter for immunization       Relevant Orders   Flu Vaccine Trivalent High Dose (Fluad) (Completed)        Meds ordered this encounter  Medications   apixaban  (ELIQUIS ) 2.5 MG TABS tablet    Sig: Take 1 tablet (2.5 mg total) by mouth 2 (two) times daily.    Dispense:  60 tablet    Refill:  6   clonazePAM  (KLONOPIN ) 0.5 MG tablet    Sig: Take 0.5 tablets (0.25 mg total) by mouth every other day. At bedtime as needed    Dispense:  30 tablet    Refill:  0   pantoprazole  (PROTONIX ) 40 MG tablet    Sig: Take 1 tablet (40 mg total) by mouth daily.    Dispense:  90 tablet    Refill:  4   DISCONTD: sertraline  (ZOLOFT ) 50 MG tablet    Sig: Take 1 tablet (50 mg total) by mouth daily.    Dispense:  90 tablet    Refill:  4   donepezil  (ARICEPT ) 5 MG tablet    Sig: Take 1 tablet (5 mg total) by mouth at bedtime.    Dispense:  30 tablet    Refill:  3   sertraline  (ZOLOFT ) 25 MG tablet    Sig: Take 1 tablet (25 mg total) by mouth daily.    Dispense:  30 tablet    Refill:  3    USE THIS DOSE    Orders Placed This Encounter  Procedures   Flu Vaccine Trivalent High Dose (Fluad)    Patient Instructions  Flu shot today  May  try memory medicine donepezil  (Aricept ) 5mg  nightly.  Drop klonopin  to 1/2 tablet every other night as needed for sleep - hopeful to retry taper off this medicine.  As mood is doing well, cut sertraline  from 50mg  to 25mg  (new tablet dose sent to pharmacy) for the next month then try off of it.  For constipation - start taking over the counter colace stool  softener daily. If this isn't helpful, may take miralax  over the counter 1/2-1 capful daily as needed for constipation. Stay well hydrated, good fiber intake.  Cholesterol levels were much worse - ensure you're taking Repatha  every 2 weeks and Crestor  (rosuvastatin ) 5mg  3 days a week. Let me know what you  find out Return in 3 months for follow up visit.   Follow up plan: Return in about 3 months (around 11/17/2023) for follow up visit.  Anton Blas, MD

## 2023-08-20 NOTE — Assessment & Plan Note (Signed)
Appreciate cardiology care.  °

## 2023-08-20 NOTE — Assessment & Plan Note (Signed)
 Chronic, ongoing difficulty. Brain imaging with MRI in September of last year showed mild to moderate generalized atrophy and moderate chronic small vessel ischemic changes with prominent perivascular space at left basal ganglia. History of Namenda  intolerance when tried April 2024. Again discussed benefits versus risks of acetylcholinesterase inhibitor donepezil  including cardiac conduction abnormalities, syncope, GI side effects.  They desire to trial donepezil -will send in low-dose 5 mg nightly.  Reassess at 2-48-month follow-up visit.

## 2023-08-20 NOTE — Patient Instructions (Addendum)
 Flu shot today  May try memory medicine donepezil  (Aricept ) 5mg  nightly.  Drop klonopin  to 1/2 tablet every other night as needed for sleep - hopeful to retry taper off this medicine.  As mood is doing well, cut sertraline  from 50mg  to 25mg  (new tablet dose sent to pharmacy) for the next month then try off of it.  For constipation - start taking over the counter colace stool softener daily. If this isn't helpful, may take miralax  over the counter 1/2-1 capful daily as needed for constipation. Stay well hydrated, good fiber intake.  Cholesterol levels were much worse - ensure you're taking Repatha  every 2 weeks and Crestor  (rosuvastatin ) 5mg  3 days a week. Let me know what you  find out Return in 3 months for follow up visit.

## 2023-08-20 NOTE — Telephone Encounter (Signed)
Can you let Ann White know that her knee x-ray got a lot worse compared to her last one.  She now has severe arthritis underneath the kneecap and severe arthritis at the lateral/outer portion of the knee.

## 2023-08-20 NOTE — Assessment & Plan Note (Signed)
Continues renally dosed Tikosyn and Eliquis 2.5 mg twice daily.

## 2023-08-20 NOTE — Assessment & Plan Note (Signed)
Notes worsening constipation without abd pain - will start daily colace with option for PRN miralax if needed. Consider fiber supplementation.

## 2023-08-20 NOTE — Assessment & Plan Note (Addendum)
Seems euvolemic. Remaining off losartan 25mg .

## 2023-08-20 NOTE — Assessment & Plan Note (Signed)
Continue Eliquis in A-fib history

## 2023-08-20 NOTE — Assessment & Plan Note (Signed)
Will discuss repeat carotid ultrasound at next visit and recent worsening cholesterol control.

## 2023-08-20 NOTE — Assessment & Plan Note (Addendum)
 Chronic, marked deterioration in cholesterol control with LDL 150s of unclear cause as they report compliance with Crestor  5mg  MWF and Repatha  injection every 2 weeks administered at home. Goal LDL <70. I asked them to verify lipid regimen at home and let me know for further titration depending on what they're taking. The 10-year ASCVD risk score (Arnett DK, et al., 2019) is: 18%   Values used to calculate the score:     Age: 78 years     Sex: Female     Is Non-Hispanic African American: No     Diabetic: No     Tobacco smoker: No     Systolic Blood Pressure: 128 mmHg     Is BP treated: No     HDL Cholesterol: 55.9 mg/dL     Total Cholesterol: 251 mg/dL

## 2023-08-20 NOTE — Assessment & Plan Note (Signed)
A1c remains stable off medication.

## 2023-08-20 NOTE — Assessment & Plan Note (Signed)
Chronic, stable off antihypertensive.

## 2023-08-20 NOTE — Assessment & Plan Note (Addendum)
Chronic, stable period on once daily pantoprazole and pepcid 20mg  BID.

## 2023-08-20 NOTE — Assessment & Plan Note (Signed)
 Preventative protocols reviewed and updated unless pt declined. Discussed healthy diet and lifestyle.

## 2023-08-25 NOTE — Progress Notes (Signed)
This encounter was created in error - please disregard.

## 2023-08-26 ENCOUNTER — Ambulatory Visit: Payer: Medicare Other | Admitting: Cardiology

## 2023-08-26 DIAGNOSIS — Z79899 Other long term (current) drug therapy: Secondary | ICD-10-CM

## 2023-08-26 DIAGNOSIS — I4819 Other persistent atrial fibrillation: Secondary | ICD-10-CM

## 2023-08-26 DIAGNOSIS — I428 Other cardiomyopathies: Secondary | ICD-10-CM

## 2023-08-26 NOTE — Progress Notes (Deleted)
 Electrophysiology Clinic Note    Date:  08/26/2023  Patient ID:  Chancy, Claros [birth year], MRN [id number] PCP:  Eustaquio Boyden, MD  Cardiologist:  Yvonne Kendall, MD Electrophysiologist: Lanier Prude, MD  ***refresh  Discussed the use of AI scribe software for clinical note transcription with the patient, who gave verbal consent to proceed.   Patient Profile    Chief Complaint: AFib follow-up  History of Present Illness: Ann White is a 78 y.o. female with PMH notable for persis afib, NICM, HFimpEF, AAA s/p EVAR, CKD, HTN; seen today for Lanier Prude, MD for routine electrophysiology followup.  She last saw Dr. Lalla Brothers 02/2022 where she was maintaining sinus rhtyhm on tikosyn BID. She has followed up with general cardiology more regularly, most recently 06/2023 with PA Fransico Michael. She was on tikosyn during that appt.   On follow-up today,  *** AF burden, symptoms *** palpitations *** bleeding concerns  - review eliquis, weight?    Since last being seen in our clinic the patient reports doing ***.  she denies chest pain, palpitations, dyspnea, PND, orthopnea, nausea, vomiting, dizziness, syncope, edema, weight gain, or early satiety.      AAD History: Tikosyn     ROS:  Please see the history of present illness. All other systems are reviewed and otherwise negative.    Physical Exam    VS:  There were no vitals taken for this visit. BMI: There is no height or weight on file to calculate BMI.  Wt Readings from Last 3 Encounters:  08/20/23 126 lb 8 oz (57.4 kg)  08/19/23 127 lb 4 oz (57.7 kg)  07/15/23 131 lb (59.4 kg)     GEN- The patient is well appearing, alert and oriented x 3 today.   Lungs- Clear to ausculation bilaterally, normal work of breathing.  Heart- {Blank single:19197::"Regular","Irregularly irregular"} rate and rhythm, no murmurs, rubs or gallops Extremities- {EDEMA LEVEL:28147::"No"} peripheral edema, warm,  dry    Studies Reviewed   Previous EP, cardiology notes.    EKG is ordered. Personal review of EKG from today shows:  ***       05/2023 EKG - SR at 67; QT , QTC 05/2022 EKG - AFib at 76; QT 400; QTC   TTE, 09/20/2020  1. Left ventricular ejection fraction, by estimation, is 60 to 65%. Left ventricular ejection fraction by 3D volume is 61 %. The left ventricle has normal function. The left ventricle has no regional wall motion abnormalities. Left ventricular diastolic  parameters are consistent with Grade I diastolic dysfunction (impaired relaxation).   2. Right ventricular systolic function is normal. The right ventricular size is normal. There is normal pulmonary artery systolic pressure. The estimated right ventricular systolic pressure is 30.0 mmHg.   3. Left atrial size was severely dilated.   4. Right atrial size was mild to moderately dilated.   5. The mitral valve is grossly normal. Trivial mitral valve regurgitation. No evidence of mitral stenosis.   6. The aortic valve is tricuspid. Aortic valve regurgitation is not visualized. No aortic stenosis is present.   7. The inferior vena cava is normal in size with greater than 50% respiratory variability, suggesting right atrial pressure of 3 mmHg.   Comparison(s): Changes from prior study are noted. EF has improved to  60-65%. NSR has replaced Afib.   TTE, 04/14/2020  1. Left ventricular ejection fraction, by estimation, is 40 to 45%. The left ventricle has mild to moderately  decreased function. The left ventricle has no regional wall motion abnormalities. Indeterminate diastolic filling due to E-A fusion.   2. Right ventricular systolic function is normal. The right ventricular size is normal. There is normal pulmonary artery systolic pressure.   3. Left atrial size was moderately dilated.   4. Right atrial size was mild to moderately dilated.   5. The mitral valve is normal in structure. Trivial mitral valve  regurgitation. No evidence of mitral stenosis.   6. The aortic valve is normal in structure. Aortic valve regurgitation is not visualized. No aortic stenosis is present.   7. The inferior vena cava is normal in size with greater than 50% respiratory variability, suggesting right atrial pressure of 3 mmHg.    Assessment and Plan     #) persis Afib #) high risk medication use - tikosyn Maintaining sinus on tikosyn BID EKG with stable QTC    #) Hypercoag d/t persis afib CHA2DS2-VASc Score = at least 4 [CHF History: 0, HTN History: 1, Diabetes History: 0, Stroke History: 0, Vascular Disease History: 0, Age Score: 2, Gender Score: 1].  Therefore, the patient's annual risk of stroke is 4.8 %.     {Confirm score is correct.  If not, click here to update score.  REFRESH note.  :1}   Stroke ppx - ***, appropriately dosed No bleeding concerns    #) HFimpEF Euvolemic on exam, warm and dry     {Are you ordering a CV Procedure (e.g. stress test, cath, DCCV, TEE, etc)?   Press F2        :[id number]}   Current medicines are reviewed at length with the patient today.   The patient {ACTIONS; HAS/DOES NOT HAVE:19233} concerns regarding her medicines.  The following changes were made today:  {NONE DEFAULTED:18576}  Labs/ tests ordered today include: *** No orders of the defined types were placed in this encounter.    Disposition: Follow up with {EPMDS:28135} or EP APP {EPFOLLOW UP:28173}   Signed, Sherie Don, NP  08/26/23  5:39 PM  Electrophysiology [facility]

## 2023-08-27 ENCOUNTER — Ambulatory Visit: Payer: Medicare Other | Admitting: Cardiology

## 2023-08-27 DIAGNOSIS — D6869 Other thrombophilia: Secondary | ICD-10-CM

## 2023-08-27 DIAGNOSIS — I5022 Chronic systolic (congestive) heart failure: Secondary | ICD-10-CM

## 2023-08-27 DIAGNOSIS — Z79899 Other long term (current) drug therapy: Secondary | ICD-10-CM

## 2023-08-27 DIAGNOSIS — I4819 Other persistent atrial fibrillation: Secondary | ICD-10-CM

## 2023-08-28 ENCOUNTER — Ambulatory Visit: Payer: Medicare Other | Admitting: Cardiology

## 2023-08-28 NOTE — Progress Notes (Unsigned)
Electrophysiology Clinic Note    Date:  08/30/2023  Patient ID:  Cashae, Weich [birth year], MRN [id number] PCP:  Eustaquio Boyden, MD  Cardiologist:  Yvonne Kendall, MD Electrophysiologist: Lanier Prude, MD   Discussed the use of AI scribe software for clinical note transcription with the patient, who gave verbal consent to proceed.   Patient Profile    Chief Complaint: AFib follow-up  History of Present Illness: Ann White is a 78 y.o. female with PMH notable for persis afib, NICM, HFimpEF, AAA s/p EVAR, CKD, HTN; seen today for Lanier Prude, MD for routine electrophysiology followup.  She last saw Dr. Lalla Brothers 02/2022 where she was maintaining sinus rhtyhm on tikosyn BID. She has followed up with general cardiology more regularly, most recently 06/2023 with PA Fransico Michael. She was on tikosyn during that appt.   On follow-up today, she has had no recent AFib episodes that she is aware of. She takes tikosyn BID, did not know it was supposed to be dosed every 12 hours. She thinks she has a nephrology appt soon in [place] d/t worsening kidney function.  No bleeding concerns on eliquis. She has purposefully lost about 40lbs in the last few years.  She does not check BP regularly at home, but does have a BP cuff.  She denies chest pain, chest pressure, dizziness or presyncope.   Her husband joins for appt.     AAD History: Tikosyn     ROS:  Please see the history of present illness. All other systems are reviewed and otherwise negative.    Physical Exam    VS:  BP (!) 140/78 (BP Location: Left Arm, Patient Position: Sitting, Cuff Size: Normal)   Pulse (!) 56   Ht 5\' 4"  (1.626 m)   Wt 125 lb 12.8 oz (57.1 kg)   SpO2 96%   BMI 21.59 kg/m  BMI: Body mass index is 21.59 kg/m.  Wt Readings from Last 3 Encounters:  08/30/23 125 lb 12.8 oz (57.1 kg)  08/20/23 126 lb 8 oz (57.4 kg)  08/19/23 127 lb 4 oz (57.7 kg)     GEN- The patient is well  appearing, alert and oriented x 3 today.   Lungs- Faint inspiratory and expiratory wheezing at RUL, otherwise Clear to ausculation bilaterally, normal work of breathing.  Heart- Regular rate and rhythm, no murmurs, rubs or gallops Extremities- No peripheral edema, warm, dry    Studies Reviewed   Previous EP, cardiology notes.    EKG is ordered. Personal review of EKG from today shows:    EKG Interpretation Date/Time:  Friday August 30 2023 13:07:34 EST Ventricular Rate:  56 PR Interval:  186 QRS Duration:  78 QT Interval:  450 QTC Calculation: 435 R Axis:   35  Text Interpretation: Sinus bradycardia Nonspecific T wave abnormality Confirmed by Sherie Don [phone]) on 08/30/2023 1:15:41 PM    05/2023 EKG - SR at 67; QT , QTC 05/2022 EKG - AFib at 76; QT 400; QTC   TTE, 09/20/2020  1. Left ventricular ejection fraction, by estimation, is 60 to 65%. Left ventricular ejection fraction by 3D volume is 61 %. The left ventricle has normal function. The left ventricle has no regional wall motion abnormalities. Left ventricular diastolic  parameters are consistent with Grade I diastolic dysfunction (impaired relaxation).   2. Right ventricular systolic function is normal. The right ventricular size is normal. There is normal pulmonary artery systolic pressure. The estimated right ventricular  systolic pressure is 30.0 mmHg.   3. Left atrial size was severely dilated.   4. Right atrial size was mild to moderately dilated.   5. The mitral valve is grossly normal. Trivial mitral valve regurgitation. No evidence of mitral stenosis.   6. The aortic valve is tricuspid. Aortic valve regurgitation is not visualized. No aortic stenosis is present.   7. The inferior vena cava is normal in size with greater than 50% respiratory variability, suggesting right atrial pressure of 3 mmHg.   Comparison(s): Changes from prior study are noted. EF has improved to  60-65%. NSR has replaced  Afib.   TTE, 04/14/2020  1. Left ventricular ejection fraction, by estimation, is 40 to 45%. The left ventricle has mild to moderately decreased function. The left ventricle has no regional wall motion abnormalities. Indeterminate diastolic filling due to E-A fusion.   2. Right ventricular systolic function is normal. The right ventricular size is normal. There is normal pulmonary artery systolic pressure.   3. Left atrial size was moderately dilated.   4. Right atrial size was mild to moderately dilated.   5. The mitral valve is normal in structure. Trivial mitral valve regurgitation. No evidence of mitral stenosis.   6. The aortic valve is normal in structure. Aortic valve regurgitation is not visualized. No aortic stenosis is present.   7. The inferior vena cava is normal in size with greater than 50% respiratory variability, suggesting right atrial pressure of 3 mmHg.    Assessment and Plan     #) persis Afib #) high risk medication use - tikosyn Maintaining sinus on tikosyn BID EKG with stable QTC Recent electrolytes stable Educated on need to take medication every 12 hours. Recommended to use phone alarms or reminders  #) Hypercoag d/t persis afib CHA2DS2-VASc Score = at least 4 [CHF History: 0, HTN History: 1, Diabetes History: 0, Stroke History: 0, Vascular Disease History: 0, Age Score: 2, Gender Score: 1].  Therefore, the patient's annual risk of stroke is 4.8 %.    Stroke ppx - patient was on 2.5mg  eliquis BID. With improvement in Cr, will increase dose to 5mg  BID.  No bleeding concerns  #) HFimpEF Euvolemic on exam, warm and dry  #) Hypertension Blood pressure was elevated during the visit. Patient does not regularly monitor blood pressure at home. -Check blood pressure at home a couple of times a week and record readings. If consistently elevated, consider medication adjustment.      Current medicines are reviewed at length with the patient today.   The  patient does not have concerns regarding her medicines.  The following changes were made today:   INCREASE eliquis to 5mg  BID  Labs/ tests ordered today include:  Orders Placed This Encounter  Procedures   EKG 12-Lead     Disposition: Follow up with EP APP or EP APP in 4 weeks   Signed, Sherie Don, NP  08/30/23  4:36 PM  Electrophysiology [facility]

## 2023-08-30 ENCOUNTER — Encounter: Payer: Self-pay | Admitting: Cardiology

## 2023-08-30 ENCOUNTER — Ambulatory Visit: Payer: Medicare Other | Attending: Cardiology | Admitting: Cardiology

## 2023-08-30 VITALS — BP 140/78 | HR 56 | Ht 64.0 in | Wt 125.8 lb

## 2023-08-30 DIAGNOSIS — I4819 Other persistent atrial fibrillation: Secondary | ICD-10-CM

## 2023-08-30 DIAGNOSIS — Z79899 Other long term (current) drug therapy: Secondary | ICD-10-CM

## 2023-08-30 DIAGNOSIS — I5022 Chronic systolic (congestive) heart failure: Secondary | ICD-10-CM

## 2023-08-30 DIAGNOSIS — D6869 Other thrombophilia: Secondary | ICD-10-CM

## 2023-08-30 MED ORDER — APIXABAN 5 MG PO TABS
5.0000 mg | ORAL_TABLET | Freq: Two times a day (BID) | ORAL | 3 refills | Status: AC
Start: 2023-08-30 — End: ?

## 2023-08-30 NOTE — Patient Instructions (Signed)
Medication Instructions:  Increase Eliquis to 5 mg twice daily   Please call us with your dosages of medications at home.  *If you need a refill on your cardiac medications before your next appointment, please call your pharmacy*  Follow-Up: At [facility], you and your health needs are our priority.  As part of our continuing mission to provide you with exceptional heart care, we have created designated Provider Care Teams.  These Care Teams include your primary Cardiologist (physician) and Advanced Practice Providers (APPs -  Physician Assistants and Nurse Practitioners) who all work together to provide you with the care you need, when you need it.  We recommend signing up for the patient portal called "MyChart".  Sign up information is provided on this After Visit Summary.  MyChart is used to connect with patients for Virtual Visits (Telemedicine).  Patients are able to view lab/test results, encounter notes, upcoming appointments, etc.  Non-urgent messages can be sent to your provider as well.   To learn more about what you can do with MyChart, go to [email or web address].    Your next appointment:   3 month(s)  Provider:   Steffanie Dunn, MD or Sherie Don, NP    Other Instructions Use blood pressure log given to you today- call us with any concerns.

## 2023-09-19 ENCOUNTER — Encounter: Payer: Self-pay | Admitting: Family Medicine

## 2023-09-19 ENCOUNTER — Ambulatory Visit: Admitting: Family Medicine

## 2023-09-19 VITALS — BP 154/74 | HR 56 | Temp 97.8°F | Ht 60.5 in | Wt 124.0 lb

## 2023-09-19 DIAGNOSIS — M7711 Lateral epicondylitis, right elbow: Secondary | ICD-10-CM | POA: Diagnosis not present

## 2023-09-19 DIAGNOSIS — M7521 Bicipital tendinitis, right shoulder: Secondary | ICD-10-CM | POA: Diagnosis not present

## 2023-09-19 MED ORDER — PREDNISONE 20 MG PO TABS: ORAL_TABLET | ORAL | 0 refills | Status: DC

## 2023-09-19 NOTE — Progress Notes (Signed)
 Jeannie Mallinger T. Branden Vine, MD, CAQ Sports Medicine [facility] at [facility] [address] [place], [address]  Phone: [phone]  FAX: [phone]  Ann White - 78 y.o. female  MRN [id number]  Date of Birth: [birth year]  Date: 09/19/2023  PCP: Eustaquio Boyden, MD  Referral: Eustaquio Boyden, MD  Chief Complaint  Patient presents with   Arm Pain    Right-Starts at shoulder and goes down to elbow x 2 to 3 wekks   Subjective:   Ann White is a 78 y.o. very pleasant female patient with Body mass index is 23.82 kg/m. who presents with the following:  The patient presents with some ongoing right sided arm pain.  She has pain in the concentrates at the lateral epicondyle on the right elbow, however she does have some additional pain more caudal to this anteriorly as well as some mild pain distal to the shoulder.  She has some minimal pain at the medial epicondyle on the right, as well.  She cannot recall a specific injury or incident that could have caused this to her or flareup in pain.  She is a well-known patient and I have seen her quite a number of times over the years.  No significant swelling or bruising. She has right-hand-dominant, and the pain is limiting her little bit with her day-to-day activities and chores.  Review of Systems is noted in the HPI, as appropriate  Objective:   BP (!) 154/74 (BP Location: Left Arm, Patient Position: Sitting, Cuff Size: Normal)   Pulse (!) 56   Temp 97.8 F (36.6 C) (Temporal)   Ht 5' 0.5" (1.537 m)   Wt 124 lb (56.2 kg)   SpO2 97%   BMI 23.82 kg/m   GEN: No acute distress; alert,appropriate. PULM: Breathing comfortably in no respiratory distress PSYCH: Normally interactive.   Does have some mild tenderness in the bicipital groove along with some mild pain with speeds testing She does also have some mild tenderness in the distal upper arm in and around the region of the brachialis and  brachial radialis  Elbow: R Ecchymosis or edema: neg ROM: full flexion, extension, pronation, supination Shoulder ROM: Full Flexion: 5/5 Extension: 5/5, PAINFUL Supination: 5/5, PAINFUL Pronation: 5/5 Wrist ext: 5/5 Wrist flexion: 5/5 No gross bony abnormality Varus and Valgus stress: stable ECRB tenderness: YES, TTP Medial epicondyle: Minimally tender to palpation Lateral epicondyle, resisted wrist extension from wrist full pronation and flexion: PAINFUL grip: 5/5  sensation intact Tinel's, Elbow: negative   Laboratory and Imaging Data:  Assessment and Plan:     ICD-10-CM   1. Lateral epicondylitis of right elbow  M77.11     2. Biceps tendonitis, right  M75.21      She has a number of issues and tendinopathy going on right now.  That the primary area of pain is at the lateral epicondyle, and this is classic tennis elbow. She has some minimal pain at the medial epicondyle She does also have some pain at the to a lesser extent the brachialis and brachial radialis She also has some tenderness in the bicipital groove and some pain with a little bit of stress of the long head of the biceps  I am going to have her do a round of some steroids, topical Voltaren gel and basic motion  Medication Management during today's office visit: Meds ordered this encounter  Medications   predniSONE (DELTASONE) 20 MG tablet    Sig: 2 tabs po daily for  5 days, then 1 tab po daily for 5 days    Dispense:  15 tablet    Refill:  0   There are no discontinued medications.  Orders placed today for conditions managed today: No orders of the defined types were placed in this encounter.   Disposition: No follow-ups on file.  [employer] One speech-to-text software was used for transcription in this dictation.  Possible transcriptional errors can occur using Animal nutritionist.   Signed,  Elpidio Galea. Keilyn Nadal, MD   Outpatient Encounter Medications as of 09/19/2023  Medication Sig    acetaminophen (TYLENOL) 500 MG tablet Take 500-1,000 mg by mouth every 6 (six) hours as needed (knee pain.).   apixaban (ELIQUIS) 5 MG TABS tablet Take 1 tablet (5 mg total) by mouth 2 (two) times daily.   cetirizine (ZYRTEC) 10 MG tablet Take 10 mg by mouth daily as needed for allergies.   Cholecalciferol (VITAMIN D3) 1000 UNITS CAPS Take 1 capsule (1,000 Units total) by mouth daily.   clonazePAM (KLONOPIN) 0.5 MG tablet Take 0.5 tablets (0.25 mg total) by mouth every other day. At bedtime as needed   dofetilide (TIKOSYN) 125 MCG capsule TAKE 1 CAPSULE BY MOUTH TWICE A DAY   donepezil (ARICEPT) 5 MG tablet Take 1 tablet (5 mg total) by mouth at bedtime.   Evolocumab (REPATHA SURECLICK) 140 MG/ML SOAJ Inject 140 mg into the skin every 14 (fourteen) days.   famotidine (PEPCID) 20 MG tablet Take 1 tablet (20 mg total) by mouth 2 (two) times daily.   Multiple Vitamin (MULTIVITAMIN WITH MINERALS) TABS tablet Take 1 tablet by mouth daily.   oxymetazoline (AFRIN) 0.05 % nasal spray Place 1 spray into both nostrils 2 (two) times daily as needed for congestion.   pantoprazole (PROTONIX) 40 MG tablet Take 1 tablet (40 mg total) by mouth daily.   predniSONE (DELTASONE) 20 MG tablet 2 tabs po daily for 5 days, then 1 tab po daily for 5 days   rosuvastatin (CRESTOR) 5 MG tablet TAKE 1 TABLET BY MOUTH THREE TIMES A WEEK   sertraline (ZOLOFT) 25 MG tablet Take 1 tablet (25 mg total) by mouth daily.   traMADol (ULTRAM) 50 MG tablet TAKE 1 TABLET BY MOUTH TWICE DAILY AS NEEDED FOR MODERATE PAIN   vitamin B-12 (CYANOCOBALAMIN) 500 MCG tablet Take 1 tablet (500 mcg total) by mouth once a week.   No facility-administered encounter medications on file as of 09/19/2023.

## 2023-09-19 NOTE — Patient Instructions (Addendum)
 Voltaren 1% gel, over the counter ?You can apply up to 4 times a day ? ?This can be applied to any joint: knee, wrist, fingers, elbows, shoulders, feet and ankles. ?Can apply to any tendon: tennis elbow, achilles, tendon, rotator cuff or any other tendon. ? ?Minimal is absorbed in the bloodstream: ok with oral anti-inflammatory or a blood thinner. ? ?Cost is about 9 dollars  ?

## 2023-10-15 ENCOUNTER — Telehealth (INDEPENDENT_AMBULATORY_CARE_PROVIDER_SITE_OTHER): Admitting: Family Medicine

## 2023-10-15 ENCOUNTER — Telehealth: Payer: Self-pay

## 2023-10-15 DIAGNOSIS — R3915 Urgency of urination: Secondary | ICD-10-CM

## 2023-10-15 DIAGNOSIS — R300 Dysuria: Secondary | ICD-10-CM | POA: Diagnosis not present

## 2023-10-15 LAB — POC URINALSYSI DIPSTICK (AUTOMATED)
Bilirubin, UA: NEGATIVE
Blood, UA: NEGATIVE
Glucose, UA: NEGATIVE
Ketones, UA: NEGATIVE
Nitrite, UA: NEGATIVE
Protein, UA: NEGATIVE
Spec Grav, UA: 1.02 (ref 1.010–1.025)
Urobilinogen, UA: 0.2 U/dL
pH, UA: 6 (ref 5.0–8.0)

## 2023-10-15 NOTE — Telephone Encounter (Signed)
 Called and spoke to daughter. On dpr. She has had frequency for last few days. Does not have appointment until may with urology. Denies any nausea or vomiting. No mention of fever or abdominal pain. Patient has not mentioned blood in urine but has occasional discomfort with urination.  Verified NKDA and pharmacy on file

## 2023-10-15 NOTE — Telephone Encounter (Addendum)
 Per Erin [lab], pt dropped off a urine sample in specimen bucket in foyer today. There is no note or order in pt's chart for a sample. Per Dr Reece Agar, he did not request it.   Attempted to contact pt to get reason she left urine sample. No answer, no vm.  If pt is having urinary sxs, our policy is an OV is needed. Plz schedule pt.

## 2023-10-15 NOTE — Telephone Encounter (Addendum)
 Please do UA in office for dysuria and urinary frequency.  If abnormal, may order UCx.  Thanks.

## 2023-10-15 NOTE — Telephone Encounter (Signed)
 Please notify urine overall ok - will await urine culture.

## 2023-10-15 NOTE — Telephone Encounter (Addendum)
 Noted, See other 10/15/23 phn note.  UA [abnormal] ran, results documented and UCx ordered. Fyi to Dr Reece Agar.

## 2023-10-15 NOTE — Telephone Encounter (Signed)
 Spoke with pt's daughter, Ann White (on dpr), relaying Dr Timoteo Expose message. She verbalizes understanding and will inform pt.

## 2023-10-15 NOTE — Telephone Encounter (Signed)
 Pt dropped off urine sample - unclear indication. Plz call pt to ask why she dropped this off.

## 2023-10-15 NOTE — Telephone Encounter (Signed)
 See other 10/15/23 phn note.

## 2023-10-15 NOTE — Addendum Note (Signed)
 Addended by: Nanci Pina on: 10/15/2023 02:25 PM   Modules accepted: Orders

## 2023-10-17 ENCOUNTER — Other Ambulatory Visit: Payer: Self-pay | Admitting: Family Medicine

## 2023-10-17 DIAGNOSIS — N3000 Acute cystitis without hematuria: Secondary | ICD-10-CM

## 2023-10-17 LAB — URINE CULTURE
MICRO NUMBER:: 16273936
SPECIMEN QUALITY:: ADEQUATE

## 2023-10-17 MED ORDER — AMOXICILLIN 875 MG PO TABS: 875.0000 mg | ORAL_TABLET | Freq: Two times a day (BID) | ORAL | 0 refills | Status: AC

## 2023-11-18 ENCOUNTER — Ambulatory Visit: Payer: Medicare Other | Admitting: Family Medicine

## 2023-11-18 DIAGNOSIS — R634 Abnormal weight loss: Secondary | ICD-10-CM | POA: Diagnosis not present

## 2023-11-18 DIAGNOSIS — N1832 Chronic kidney disease, stage 3b: Secondary | ICD-10-CM | POA: Diagnosis not present

## 2023-11-18 DIAGNOSIS — I10 Essential (primary) hypertension: Secondary | ICD-10-CM | POA: Diagnosis not present

## 2023-11-26 NOTE — Progress Notes (Unsigned)
 Electrophysiology Clinic Note    Date:  11/27/2023  Patient ID:  Alundra, Petrecca 03-06-1946, MRN [id number] PCP:  Claire Crick, MD  Cardiologist:  Sammy Crisp, MD Electrophysiologist: Boyce Byes, MD   Discussed the use of AI scribe software for clinical note transcription with the patient, who gave verbal consent to proceed.   Patient Profile    Chief Complaint: AFib, Tikosyn  follow-up  History of Present Illness: Ann White is a 78 y.o. female with PMH notable for persis afib, NICM, HFimpEF, AAA s/p EVAR, CKD, HTN; seen today for Boyce Byes, MD for routine electrophysiology followup.   I last saw her 08/2023 for routine tikosyn  follow-up. Taking tikosyn  BID instead of q12h.   On follow-up today, she is feeling very well without cardiac complaints. Diligently takes tikosyn  BID along with eliquis  BID. She does have superficial bleeding along her shins from bumping into things when walking. She is not aware of any AF episodes, has no limitations with her activities.  No bleeding concerns through GI/GU systems.   She denies chest pain, chest pressure, dizziness or presyncope.   Her husband joins for appt.     AAD History: Tikosyn      ROS:  Please see the history of present illness. All other systems are reviewed and otherwise negative.    Physical Exam    VS:  BP 138/62   Pulse (!) 51   Ht 5\' 4"  (1.626 m)   Wt 115 lb 12.8 oz (52.5 kg)   SpO2 97%   BMI 19.88 kg/m  BMI: Body mass index is 19.88 kg/m.  Wt Readings from Last 3 Encounters:  11/27/23 115 lb 12.8 oz (52.5 kg)  09/19/23 124 lb (56.2 kg)  08/30/23 125 lb 12.8 oz (57.1 kg)     GEN- The patient is well appearing, alert and oriented x 3 today.   Lungs- Clear to ausculation bilaterally, normal work of breathing.  Heart- Regular, bradycardic rate and rhythm, no murmurs, rubs or gallops Extremities- No peripheral edema, warm, dry. Few scabs along bilateral  shins.    Studies Reviewed   Previous EP, cardiology notes.    EKG is ordered. Personal review of EKG from today shows:    EKG Interpretation Date/Time:  Wednesday Nov 27 2023 10:07:54 EDT Ventricular Rate:  51 PR Interval:  170 QRS Duration:  80 QT Interval:  428 QTC Calculation: 394 R Axis:   19  Text Interpretation: Sinus bradycardia When compared with ECG of 30-Aug-2023 13:07, No significant change was found Confirmed by Tobechukwu Emmick [phone] on 11/27/2023 10:13:11 AM    08/30/2023 EKG - SB at 56; QT 450, QTC 05/2023 EKG - SR at 67; QT , QTC 05/2022 EKG - AFib at 76; QT 400; QTC   TTE, 09/20/2020  1. Left ventricular ejection fraction, by estimation, is 60 to 65%. Left ventricular ejection fraction by 3D volume is 61 %. The left ventricle has normal function. The left ventricle has no regional wall motion abnormalities. Left ventricular diastolic  parameters are consistent with Grade I diastolic dysfunction (impaired relaxation).   2. Right ventricular systolic function is normal. The right ventricular size is normal. There is normal pulmonary artery systolic pressure. The estimated right ventricular systolic pressure is 30.0 mmHg.   3. Left atrial size was severely dilated.   4. Right atrial size was mild to moderately dilated.   5. The mitral valve is grossly normal. Trivial mitral valve regurgitation. No evidence  of mitral stenosis.   6. The aortic valve is tricuspid. Aortic valve regurgitation is not visualized. No aortic stenosis is present.   7. The inferior vena cava is normal in size with greater than 50% respiratory variability, suggesting right atrial pressure of 3 mmHg.   Comparison(s): Changes from prior study are noted. EF has improved to  60-65%. NSR has replaced Afib.   TTE, 04/14/2020  1. Left ventricular ejection fraction, by estimation, is 40 to 45%. The left ventricle has mild to moderately decreased function. The left ventricle has no  regional wall motion abnormalities. Indeterminate diastolic filling due to E-A fusion.   2. Right ventricular systolic function is normal. The right ventricular size is normal. There is normal pulmonary artery systolic pressure.   3. Left atrial size was moderately dilated.   4. Right atrial size was mild to moderately dilated.   5. The mitral valve is normal in structure. Trivial mitral valve regurgitation. No evidence of mitral stenosis.   6. The aortic valve is normal in structure. Aortic valve regurgitation is not visualized. No aortic stenosis is present.   7. The inferior vena cava is normal in size with greater than 50% respiratory variability, suggesting right atrial pressure of 3 mmHg.    Assessment and Plan     #) persis Afib #) high risk medication use - tikosyn  Maintaining sinus on tikosyn  BID EKG with stable QTC Recent electrolytes, Cr stable via care everywhere CrCl 32.5ml/min, ok to continue tikosyn  BID   #) Hypercoag d/t persis afib CHA2DS2-VASc Score = at least 4 [CHF History: 0, HTN History: 1, Diabetes History: 0, Stroke History: 0, Vascular Disease History: 0, Age Score: 2, Gender Score: 1].  Therefore, the patient's annual risk of stroke is 4.8 %.    Stroke ppx - continue 5mg  eliquis  BID Continue to monitor for bleeding concerns          Current medicines are reviewed at length with the patient today.   The patient does not have concerns regarding her medicines.  The following changes were made today:   none  Labs/ tests ordered today include:  Orders Placed This Encounter  Procedures   EKG 12-Lead     Disposition: Follow up with EP APP or EP APP in 4 weeks   Signed, Dandrea Medders, NP  11/27/23  10:25 AM  Electrophysiology [facility]

## 2023-11-27 ENCOUNTER — Encounter: Payer: Self-pay | Admitting: Cardiology

## 2023-11-27 ENCOUNTER — Ambulatory Visit: Payer: Medicare Other | Attending: Cardiology | Admitting: Cardiology

## 2023-11-27 VITALS — BP 138/62 | HR 51 | Ht 64.0 in | Wt 115.8 lb

## 2023-11-27 DIAGNOSIS — D6869 Other thrombophilia: Secondary | ICD-10-CM | POA: Diagnosis not present

## 2023-11-27 DIAGNOSIS — I4819 Other persistent atrial fibrillation: Secondary | ICD-10-CM

## 2023-11-27 DIAGNOSIS — Z79899 Other long term (current) drug therapy: Secondary | ICD-10-CM

## 2023-11-27 NOTE — Patient Instructions (Signed)
 Medication Instructions:  The current medical regimen is effective;  continue present plan and medications.  *If you need a refill on your cardiac medications before your next appointment, please call your pharmacy*   Follow-Up: At [facility], you and your health needs are our priority.  As part of our continuing mission to provide you with exceptional heart care, our providers are all part of one team.  This team includes your primary Cardiologist (physician) and Advanced Practice Providers or APPs (Physician Assistants and Nurse Practitioners) who all work together to provide you with the care you need, when you need it.  Your next appointment:   3-4 month(s)  Provider:   Suzann Riddle, NP    We recommend signing up for the patient portal called "MyChart".  Sign up information is provided on this After Visit Summary.  MyChart is used to connect with patients for Virtual Visits (Telemedicine).  Patients are able to view lab/test results, encounter notes, upcoming appointments, etc.  Non-urgent messages can be sent to your provider as well.   To learn more about what you can do with MyChart, go to [email or web address].

## 2023-12-01 ENCOUNTER — Other Ambulatory Visit: Payer: Self-pay | Admitting: Family Medicine

## 2023-12-01 DIAGNOSIS — G3184 Mild cognitive impairment, so stated: Secondary | ICD-10-CM

## 2023-12-02 NOTE — Telephone Encounter (Signed)
 Donepezil  Last rx:  11/24/23, #30 Last OV:  08/20/23,  CPE Next OV:  12/19/23, 3 mo [facility] f/u

## 2023-12-11 ENCOUNTER — Other Ambulatory Visit: Payer: Self-pay | Admitting: Family Medicine

## 2023-12-11 DIAGNOSIS — Z1231 Encounter for screening mammogram for malignant neoplasm of breast: Secondary | ICD-10-CM

## 2023-12-19 ENCOUNTER — Encounter: Payer: Self-pay | Admitting: Family Medicine

## 2023-12-19 ENCOUNTER — Ambulatory Visit (INDEPENDENT_AMBULATORY_CARE_PROVIDER_SITE_OTHER): Admitting: Family Medicine

## 2023-12-19 VITALS — BP 128/70 | HR 51 | Temp 97.8°F | Ht 64.0 in | Wt 117.5 lb

## 2023-12-19 DIAGNOSIS — K219 Gastro-esophageal reflux disease without esophagitis: Secondary | ICD-10-CM | POA: Diagnosis not present

## 2023-12-19 DIAGNOSIS — I6523 Occlusion and stenosis of bilateral carotid arteries: Secondary | ICD-10-CM | POA: Diagnosis not present

## 2023-12-19 DIAGNOSIS — R109 Unspecified abdominal pain: Secondary | ICD-10-CM | POA: Diagnosis not present

## 2023-12-19 DIAGNOSIS — N1832 Chronic kidney disease, stage 3b: Secondary | ICD-10-CM | POA: Diagnosis not present

## 2023-12-19 DIAGNOSIS — F5104 Psychophysiologic insomnia: Secondary | ICD-10-CM

## 2023-12-19 DIAGNOSIS — E7849 Other hyperlipidemia: Secondary | ICD-10-CM

## 2023-12-19 DIAGNOSIS — G3184 Mild cognitive impairment, so stated: Secondary | ICD-10-CM

## 2023-12-19 DIAGNOSIS — F331 Major depressive disorder, recurrent, moderate: Secondary | ICD-10-CM

## 2023-12-19 DIAGNOSIS — I10 Essential (primary) hypertension: Secondary | ICD-10-CM | POA: Diagnosis not present

## 2023-12-19 DIAGNOSIS — Z79899 Other long term (current) drug therapy: Secondary | ICD-10-CM

## 2023-12-19 DIAGNOSIS — N179 Acute kidney failure, unspecified: Secondary | ICD-10-CM | POA: Diagnosis not present

## 2023-12-19 LAB — BASIC METABOLIC PANEL WITH GFR
BUN: 23 mg/dL (ref 6–23)
CO2: 23 meq/L (ref 19–32)
Calcium: 9.8 mg/dL (ref 8.4–10.5)
Chloride: 104 meq/L (ref 96–112)
Creatinine, Ser: 1.15 mg/dL (ref 0.40–1.20)
GFR: 46.23 mL/min — ABNORMAL LOW (ref >60.00)
Glucose, Bld: 86 mg/dL (ref 70–99)
Potassium: 3.9 meq/L (ref 3.5–5.1)
Sodium: 140 meq/L (ref 135–145)

## 2023-12-19 LAB — LIPID PANEL
Cholesterol: 201 mg/dL — ABNORMAL HIGH (ref 0–200)
HDL: 62.4 mg/dL (ref >39.00)
LDL Cholesterol: 114 mg/dL — ABNORMAL HIGH (ref 0–99)
NonHDL: 138.61
Total CHOL/HDL Ratio: 3
Triglycerides: 124 mg/dL (ref 0.0–149.0)
VLDL: 24.8 mg/dL (ref 0.0–40.0)

## 2023-12-19 NOTE — Patient Instructions (Addendum)
 Let me and kidney doctor know if you're taking losartan  and what dose. She has you on 100mg  and I have you on none.  Check on how you're taking klonopin  (clonazepam ) and let me know.  Ensure you're taking pantoprazole  40mg  daily and pepcid  20mg  twice daily for reflux.  Stop sertraline  25mg  mood medicine - monitor for return of depression.  Stop donepezil  (Aricept ) to see if abdominal pain goes away.  I will order updated carotid ultrasound to follow bilateral carotid artery stenosis.  Return in 3 months for follow up visit, let us  know sooner if abdominal pain doesn't improve off donepezil .

## 2023-12-19 NOTE — Assessment & Plan Note (Signed)
 Continue ppi, pepcid 

## 2023-12-19 NOTE — Assessment & Plan Note (Addendum)
 Chronic. She's unsure if she's on ARB or off this. I had her off 25mg  tablet, nephrology had her on 100mg  tablet - she will check and let me know whether or not she's taking this.

## 2023-12-19 NOTE — Assessment & Plan Note (Signed)
 Overall stable period.  Will stop sertraline  25mg  and monitor for recurrent depression.

## 2023-12-19 NOTE — Assessment & Plan Note (Signed)
 Not consistent with diverticulitis ?aricept  related - newest medication. Will stop this and reassess. Update if ongoing or worsening symptoms.  Continue PPI, pepcid  regularly.

## 2023-12-19 NOTE — Assessment & Plan Note (Signed)
 Chronic.  Stop aricept  - to see if abd discomfort resolves.  If that is the case, will need to remain off PO anticholinergics.

## 2023-12-19 NOTE — Assessment & Plan Note (Signed)
Update carotid US.  

## 2023-12-19 NOTE — Assessment & Plan Note (Signed)
 Update FLP - continue crestor  5mg  MWF with Repatha  q2 wks.

## 2023-12-19 NOTE — Assessment & Plan Note (Signed)
Appreciate nephrology care.

## 2023-12-19 NOTE — Assessment & Plan Note (Signed)
 I asked them to let me know how she's taking klonopin  - should be on 0.25mg  QO night.

## 2023-12-19 NOTE — Progress Notes (Signed)
 Ph: [phone] Fax: [phone]   Patient ID: Ann White, female    DOB: 22-Apr-1946, 77 y.o.   MRN: [id number]  This visit was conducted in person.  BP 128/70   Pulse (!) 51   Temp 97.8 F (36.6 C) (Oral)   Ht 5\' 4"  (1.626 m)   Wt 117 lb 8 oz (53.3 kg)   SpO2 96%   BMI 20.17 kg/m    CC: 3 mo f/u visit  Subjective:   HPI: Ann White is a 78 y.o. female presenting on 12/19/2023 for Medical Management of Chronic Issues (Here for 3 mo f/u.)   See prior note for details.  Sees Dr Marven Slimmer for afib on eliquis  and tikosyn . Also sees Dr End for non ischemic CM and lipid clinic for familial hyperlipidemia - stable period on losartan  25mg . She is taking Repatha  q2 weeks for cholesterol control, as well as crestor  three times a week.    [facility] - last visit we started donepezil  5mg  daily - tolerating this well without GI upset or palpitation/chest pain and she feels it is helping some. H/o namenda  intolerance.  Brain imaging with MRI 03/2023 showed mild to moderate generalized atrophy and moderate chronic small vessel ischemic changes with prominent perivascular space at left basal ganglia.  MDD - doing well with slow taper off sertraline .   CKD - established with Dr Donel Fujisawa office/Rachel Hyler NP. CKD thought combination of vasculopathy and HTN. Most recently jardiance 10mg  started last month. Vit D supplementation was stopped due to hypercalcemia.  BP stable off antihypertensives.   Bilat carotid stenosis - will update carotid imaging.  Insomnia - on klonopin  0.25mg  every other night - she thinks she's taking 0.5mg  nightly.   E coli UTI treated via phone 10/15/2023 - amoxicillin  x5d. No ongoing UTI symptoms.  She notes some lower abd pain for the past 1-2 weeks. No fevers/chills, n/v/d/c, worsening heartburn, UTI symptoms. No diet changes.  This is despite pepcid  20mg  bid and pantoprazole  40mg  daily.      Relevant past medical, surgical, family and social history  reviewed and updated as indicated. Interim medical history since our last visit reviewed. Allergies and medications reviewed and updated. Outpatient Medications Prior to Visit  Medication Sig Dispense Refill   acetaminophen  (TYLENOL ) 500 MG tablet Take 500-1,000 mg by mouth every 6 (six) hours as needed (knee pain.).     apixaban  (ELIQUIS ) 5 MG TABS tablet Take 1 tablet (5 mg total) by mouth 2 (two) times daily. 90 tablet 3   cetirizine (ZYRTEC) 10 MG tablet Take 10 mg by mouth daily as needed for allergies.     clonazePAM  (KLONOPIN ) 0.5 MG tablet Take 0.5 tablets (0.25 mg total) by mouth every other day. At bedtime as needed 30 tablet 0   dofetilide  (TIKOSYN ) 125 MCG capsule TAKE 1 CAPSULE BY MOUTH TWICE A DAY 180 capsule 3   Evolocumab  (REPATHA  SURECLICK) 140 MG/ML SOAJ Inject 140 mg into the skin every 14 (fourteen) days. 6 mL 3   famotidine  (PEPCID ) 20 MG tablet Take 1 tablet (20 mg total) by mouth 2 (two) times daily.     JARDIANCE 10 MG TABS tablet Take 10 mg by mouth in the morning.     Multiple Vitamin (MULTIVITAMIN WITH MINERALS) TABS tablet Take 1 tablet by mouth daily.     oxymetazoline  (AFRIN) 0.05 % nasal spray Place 1 spray into both nostrils 2 (two) times daily as needed for congestion.     pantoprazole  (PROTONIX ) 40 MG  tablet Take 1 tablet (40 mg total) by mouth daily. 90 tablet 4   rosuvastatin  (CRESTOR ) 5 MG tablet TAKE 1 TABLET BY MOUTH THREE TIMES A WEEK 45 tablet 2   traMADol  (ULTRAM ) 50 MG tablet TAKE 1 TABLET BY MOUTH TWICE DAILY AS NEEDED FOR MODERATE PAIN 30 tablet 2   vitamin B-12 (CYANOCOBALAMIN ) 500 MCG tablet Take 1 tablet (500 mcg total) by mouth once a week.     Cholecalciferol  (VITAMIN D3) 1000 UNITS CAPS Take 1 capsule (1,000 Units total) by mouth daily.     donepezil  (ARICEPT ) 5 MG tablet TAKE 1 TABLET BY MOUTH AT BEDTIME 30 tablet 1   predniSONE  (DELTASONE ) 20 MG tablet 2 tabs po daily for 5 days, then 1 tab po daily for 5 days 15 tablet 0   sertraline  (ZOLOFT )  25 MG tablet Take 1 tablet (25 mg total) by mouth daily. 30 tablet 3   No facility-administered medications prior to visit.     Per HPI unless specifically indicated in ROS section below Review of Systems  Objective:  BP 128/70   Pulse (!) 51   Temp 97.8 F (36.6 C) (Oral)   Ht 5\' 4"  (1.626 m)   Wt 117 lb 8 oz (53.3 kg)   SpO2 96%   BMI 20.17 kg/m   Wt Readings from Last 3 Encounters:  12/19/23 117 lb 8 oz (53.3 kg)  11/27/23 115 lb 12.8 oz (52.5 kg)  09/19/23 124 lb (56.2 kg)      Physical Exam Vitals and nursing note reviewed.  Constitutional:      Appearance: Normal appearance. She is not ill-appearing.  HENT:     Head: Normocephalic and atraumatic.     Mouth/Throat:     Mouth: Mucous membranes are moist.     Pharynx: No oropharyngeal exudate or posterior oropharyngeal erythema.  Eyes:     Extraocular Movements: Extraocular movements intact.     Pupils: Pupils are equal, round, and reactive to light.  Neck:     Vascular: No carotid bruit.  Cardiovascular:     Rate and Rhythm: Regular rhythm. Bradycardia present.     Pulses: Normal pulses.     Heart sounds: Normal heart sounds. No murmur heard. Pulmonary:     Effort: Pulmonary effort is normal. No respiratory distress.     Breath sounds: Normal breath sounds. No wheezing, rhonchi or rales.  Abdominal:     General: Bowel sounds are normal. There is no distension.     Palpations: Abdomen is soft. There is no mass.     Tenderness: There is no abdominal tenderness. There is no guarding or rebound.     Hernia: No hernia is present.  Musculoskeletal:     Right lower leg: No edema.     Left lower leg: No edema.  Skin:    General: Skin is warm and dry.     Findings: No rash.  Neurological:     Mental Status: She is alert.  Psychiatric:        Mood and Affect: Mood normal.        Behavior: Behavior normal.       Lab Results  Component Value Date   NA 139 08/14/2023   CL 103 08/14/2023   K 4.7 08/14/2023    CO2 27 08/14/2023   BUN 18 08/14/2023   CREATININE 1.29 (H) 08/14/2023   GFR 40.37 (L) 08/14/2023   CALCIUM  10.0 08/14/2023   PHOS 2.8 06/12/2023   ALBUMIN 4.7 08/14/2023  GLUCOSE 85 08/14/2023    Lab Results  Component Value Date   CHOL 251 (H) 08/14/2023   HDL 55.90 08/14/2023   LDLCALC 152 (H) 08/14/2023   LDLDIRECT 132.0 03/18/2019   TRIG 219.0 (H) 08/14/2023   CHOLHDL 4 08/14/2023   Assessment & Plan:   Problem List Items Addressed This Visit     Essential hypertension   Chronic. She's unsure if she's on ARB or off this. I had her off 25mg  tablet, nephrology had her on 100mg  tablet - she will check and let me know whether or not she's taking this.       GERD (gastroesophageal reflux disease)   Continue ppi, pepcid       MDD (major depressive disorder), recurrent episode, moderate (HCC)   Overall stable period.  Will stop sertraline  25mg  and monitor for recurrent depression.       Familial combined hyperlipidemia   Update FLP - continue crestor  5mg  MWF with Repatha  q2 wks.       Relevant Orders   Basic metabolic panel with GFR   Lipid panel   Chronic kidney disease, stage 3b [facility])   Appreciate nephrology care.       Bilateral carotid artery stenosis   Update carotid US .       Relevant Orders   VAS US  CAROTID   Chronic insomnia   I asked them to let me know how she's taking klonopin  - should be on 0.25mg  QO night.       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  Stop aricept  - to see if abd discomfort resolves.  If that is the case, will need to remain off PO anticholinergics.       Acute-on-chronic kidney injury [facility])   Appreciate nephrology care.       Abdominal discomfort - Primary   Not consistent with diverticulitis ?aricept  related - newest medication. Will stop this and reassess. Update if ongoing or worsening symptoms.  Continue PPI, pepcid  regularly.         No orders of the defined  types were placed in this encounter.   Orders Placed This Encounter  Procedures   Basic metabolic panel with GFR   Lipid panel    Patient Instructions  Let me and kidney doctor know if you're taking losartan  and what dose. She has you on 100mg  and I have you on none.  Check on how you're taking klonopin  (clonazepam ) and let me know.  Ensure you're taking pantoprazole  40mg  daily and pepcid  20mg  twice daily for reflux.  Stop sertraline  25mg  mood medicine - monitor for return of depression.  Stop donepezil  (Aricept ) to see if abdominal pain goes away.  I will order updated carotid ultrasound to follow bilateral carotid artery stenosis.  Return in 3 months for follow up visit, let us  know sooner if abdominal pain doesn't improve off donepezil .  Follow up plan: Return in about 3 months (around 03/20/2024) for follow up visit.  Claire Crick, MD

## 2023-12-26 ENCOUNTER — Ambulatory Visit: Payer: Self-pay | Admitting: Family Medicine

## 2023-12-26 DIAGNOSIS — I6523 Occlusion and stenosis of bilateral carotid arteries: Secondary | ICD-10-CM

## 2023-12-30 ENCOUNTER — Other Ambulatory Visit (HOSPITAL_COMMUNITY): Payer: Self-pay

## 2024-01-06 ENCOUNTER — Other Ambulatory Visit (HOSPITAL_COMMUNITY): Payer: Self-pay

## 2024-01-06 ENCOUNTER — Other Ambulatory Visit: Payer: Self-pay

## 2024-01-06 MED ORDER — PANTOPRAZOLE SODIUM 40 MG PO TBEC
40.0000 mg | DELAYED_RELEASE_TABLET | Freq: Every day | ORAL | 4 refills | Status: DC
  Filled 2024-01-06: qty 90, 90d supply, fill #0
  Filled 2024-01-08 (×2): qty 30, 30d supply, fill #0

## 2024-01-06 MED ORDER — EMPAGLIFLOZIN 10 MG PO TABS
10.0000 mg | ORAL_TABLET | Freq: Every morning | ORAL | 3 refills | Status: DC
  Filled 2024-01-06: qty 90, 90d supply, fill #0

## 2024-01-06 MED ORDER — EMPAGLIFLOZIN 10 MG PO TABS
10.0000 mg | ORAL_TABLET | Freq: Every morning | ORAL | 3 refills | Status: AC
  Filled 2024-01-06: qty 90, 90d supply, fill #0
  Filled 2024-01-08 (×2): qty 30, 30d supply, fill #0

## 2024-01-06 MED ORDER — APIXABAN 2.5 MG PO TABS
2.5000 mg | ORAL_TABLET | Freq: Two times a day (BID) | ORAL | 6 refills | Status: DC
  Filled 2024-01-06 – 2024-01-08 (×3): qty 60, 30d supply, fill #0

## 2024-01-08 ENCOUNTER — Other Ambulatory Visit: Payer: Self-pay

## 2024-01-08 ENCOUNTER — Other Ambulatory Visit (HOSPITAL_COMMUNITY): Payer: Self-pay

## 2024-01-09 ENCOUNTER — Other Ambulatory Visit: Payer: Self-pay

## 2024-01-09 ENCOUNTER — Ambulatory Visit
Admission: RE | Admit: 2024-01-09 | Discharge: 2024-01-09 | Disposition: A | Discharge status: Home / Self Care | Source: Ambulatory Visit | Attending: Family Medicine | Admitting: Family Medicine

## 2024-01-09 ENCOUNTER — Ambulatory Visit (HOSPITAL_COMMUNITY)
Admission: RE | Admit: 2024-01-09 | Discharge: 2024-01-09 | Disposition: A | Discharge status: Home / Self Care | Source: Ambulatory Visit | Attending: Family Medicine | Admitting: Family Medicine

## 2024-01-09 DIAGNOSIS — I6523 Occlusion and stenosis of bilateral carotid arteries: Secondary | ICD-10-CM

## 2024-01-09 DIAGNOSIS — Z1231 Encounter for screening mammogram for malignant neoplasm of breast: Secondary | ICD-10-CM | POA: Diagnosis not present

## 2024-01-13 ENCOUNTER — Other Ambulatory Visit: Payer: Self-pay

## 2024-01-13 ENCOUNTER — Ambulatory Visit: Payer: Self-pay | Admitting: Family Medicine

## 2024-01-27 ENCOUNTER — Encounter: Payer: Self-pay | Admitting: Family Medicine

## 2024-01-27 ENCOUNTER — Ambulatory Visit (INDEPENDENT_AMBULATORY_CARE_PROVIDER_SITE_OTHER): Admitting: Family Medicine

## 2024-01-27 VITALS — BP 134/62 | HR 64 | Temp 98.5°F | Ht 64.0 in | Wt 116.0 lb

## 2024-01-27 DIAGNOSIS — N1832 Chronic kidney disease, stage 3b: Secondary | ICD-10-CM

## 2024-01-27 DIAGNOSIS — R350 Frequency of micturition: Secondary | ICD-10-CM

## 2024-01-27 DIAGNOSIS — G3184 Mild cognitive impairment, so stated: Secondary | ICD-10-CM | POA: Diagnosis not present

## 2024-01-27 DIAGNOSIS — R1084 Generalized abdominal pain: Secondary | ICD-10-CM | POA: Insufficient documentation

## 2024-01-27 DIAGNOSIS — N3000 Acute cystitis without hematuria: Secondary | ICD-10-CM | POA: Diagnosis not present

## 2024-01-27 DIAGNOSIS — K219 Gastro-esophageal reflux disease without esophagitis: Secondary | ICD-10-CM

## 2024-01-27 DIAGNOSIS — I4819 Other persistent atrial fibrillation: Secondary | ICD-10-CM

## 2024-01-27 DIAGNOSIS — R300 Dysuria: Secondary | ICD-10-CM | POA: Diagnosis not present

## 2024-01-27 DIAGNOSIS — Z95828 Presence of other vascular implants and grafts: Secondary | ICD-10-CM

## 2024-01-27 LAB — POC URINALSYSI DIPSTICK (AUTOMATED)
Bilirubin, UA: NEGATIVE
Blood, UA: NEGATIVE
Glucose, UA: POSITIVE — AB
Ketones, UA: NEGATIVE
Nitrite, UA: POSITIVE
Protein, UA: POSITIVE — AB
Spec Grav, UA: >=1.03 — AB (ref 1.010–1.025)
Urobilinogen, UA: 0.2 U/dL
pH, UA: 5.5 (ref 5.0–8.0)

## 2024-01-27 LAB — CBC WITH DIFFERENTIAL/PLATELET
Basophils Absolute: 0 K/uL (ref 0.0–0.1)
Basophils Relative: 0.9 % (ref 0.0–3.0)
Eosinophils Absolute: 0.1 K/uL (ref 0.0–0.7)
Eosinophils Relative: 2.6 % (ref 0.0–5.0)
HCT: 33.9 % — ABNORMAL LOW (ref 36.0–46.0)
Hemoglobin: 11.4 g/dL — ABNORMAL LOW (ref 12.0–15.0)
Lymphocytes Relative: 22.4 % (ref 12.0–46.0)
Lymphs Abs: 1 K/uL (ref 0.7–4.0)
MCHC: 33.5 g/dL (ref 30.0–36.0)
MCV: 87.6 fl (ref 78.0–100.0)
Monocytes Absolute: 0.2 K/uL (ref 0.1–1.0)
Monocytes Relative: 4.8 % (ref 3.0–12.0)
Neutro Abs: 3.1 K/uL (ref 1.4–7.7)
Neutrophils Relative %: 69.3 % (ref 43.0–77.0)
Platelets: 196 K/uL (ref 150.0–400.0)
RBC: 3.87 Mil/uL (ref 3.87–5.11)
RDW: 15.3 % (ref 11.5–15.5)
WBC: 4.5 K/uL (ref 4.0–10.5)

## 2024-01-27 LAB — COMPREHENSIVE METABOLIC PANEL WITH GFR
ALT: 7 U/L (ref 0–35)
AST: 18 U/L (ref 0–37)
Albumin: 4.8 g/dL (ref 3.5–5.2)
Alkaline Phosphatase: 41 U/L (ref 39–117)
BUN: 25 mg/dL — ABNORMAL HIGH (ref 6–23)
CO2: 27 meq/L (ref 19–32)
Calcium: 9.6 mg/dL (ref 8.4–10.5)
Chloride: 104 meq/L (ref 96–112)
Creatinine, Ser: 1.34 mg/dL — ABNORMAL HIGH (ref 0.40–1.20)
GFR: 38.45 mL/min — ABNORMAL LOW (ref >60.00)
Glucose, Bld: 87 mg/dL (ref 70–99)
Potassium: 4.3 meq/L (ref 3.5–5.1)
Sodium: 141 meq/L (ref 135–145)
Total Bilirubin: 0.5 mg/dL (ref 0.2–1.2)
Total Protein: 7.1 g/dL (ref 6.0–8.3)

## 2024-01-27 LAB — LIPASE: Lipase: 18 U/L (ref 11.0–59.0)

## 2024-01-27 MED ORDER — CEPHALEXIN 500 MG PO CAPS: 500.0000 mg | ORAL_CAPSULE | Freq: Two times a day (BID) | ORAL | 0 refills | Status: DC

## 2024-01-27 MED ORDER — FAMOTIDINE 20 MG PO TABS: 20.0000 mg | ORAL_TABLET | Freq: Every day | ORAL | Status: AC

## 2024-01-27 NOTE — Assessment & Plan Note (Signed)
 Suspect rpt UTI - see above

## 2024-01-27 NOTE — Assessment & Plan Note (Signed)
 Continue ppi, pepcid 

## 2024-01-27 NOTE — Assessment & Plan Note (Addendum)
 Namenda  intolerance. Saw neurology 04/2023 - thought mild vascular dementia. Rec against aricept  in bradycardia. Rec HST however pt denies significant difficulty with sleep, daytime somnolence, significant snoring or witnessed apneas.

## 2024-01-27 NOTE — Patient Instructions (Addendum)
 Labs today Urinalysis today  Urine suspicious for infection - will await culture. In the meantime, start keflex  antibiotic sent to [facility] pharmacy.  Continue pantoprazole  40mg  daily before lunch, and pepcid  20mg  nightly.  Pending lab results we may further evaluate with abdominal imaging.

## 2024-01-27 NOTE — Assessment & Plan Note (Signed)
 Sees afib clinic, she is on Tikosyn  and full dose eliquis 

## 2024-01-27 NOTE — Progress Notes (Signed)
 Ph: [phone] Fax: [phone]   Patient ID: Ann White, female    DOB: [birth year], 78 y.o.   MRN: [id number]  This visit was conducted in person.  BP 134/62   Pulse 64   Temp 98.5 F (36.9 C) (Oral)   Ht 5' 4 (1.626 m)   Wt 116 lb (52.6 kg)   SpO2 100%   BMI 19.91 kg/m    CC: urinary frequency Subjective:   HPI: Ann White is a 78 y.o. female presenting on 01/27/2024 for Urinary Frequency   She is unsure why she's here today.  H/o recurrent UTI, latest E coli UTI treated via phone 10/15/2023 - amoxicillin  x5d.   1-2 week h/o urinary symptoms of dysuria, urgency, frequency but without fevers/chills, abd pain, flank pain, nausea/vomiting, hematuria.   She is taking jardiance  10mg  daily. This was started 11/2023 by nephrology Dr Marcelino.   Notes appetite is ok but significant abd pain with food. Having intermittent abd discomfort with diarrhea after meals for last few months. She notes this is largely resolved. Denies indigestion, gassiness, bloating. No significant GERD symptoms, dysphagia, early satiety. She continues pantoprazole  40mg  daily but has stopped pepcid  20mg  bid.   Reviewed latest noncontrasted CT abd/pelvis from 05/2023 - colonic diverticular disease without active inflammation, AAA s/p endovascular aortic repair with bifurcated endo prosthesis.  H/o contrast allergy.   May recommend return to [facility] sooner than planned due to abdominal pain.      Relevant past medical, surgical, family and social history reviewed and updated as indicated. Interim medical history since our last visit reviewed. Allergies and medications reviewed and updated. Outpatient Medications Prior to Visit  Medication Sig Dispense Refill   acetaminophen  (TYLENOL ) 500 MG tablet Take 500-1,000 mg by mouth every 6 (six) hours as needed (knee pain.).     apixaban  (ELIQUIS ) 5 MG TABS tablet Take 1 tablet (5 mg total) by mouth 2 (two) times daily. 90 tablet 3   cetirizine  (ZYRTEC) 10 MG tablet Take 10 mg by mouth daily as needed for allergies.     clonazePAM  (KLONOPIN ) 0.5 MG tablet Take 0.5 tablets (0.25 mg total) by mouth every other day. At bedtime as needed 30 tablet 0   dofetilide  (TIKOSYN ) 125 MCG capsule TAKE 1 CAPSULE BY MOUTH TWICE A DAY 180 capsule 3   empagliflozin  (JARDIANCE ) 10 MG TABS tablet Take 1 tablet (10 mg total) by mouth in the morning. 90 tablet 3   Evolocumab  (REPATHA  SURECLICK) 140 MG/ML SOAJ Inject 140 mg into the skin every 14 (fourteen) days. 6 mL 3   Multiple Vitamin (MULTIVITAMIN WITH MINERALS) TABS tablet Take 1 tablet by mouth daily.     oxymetazoline  (AFRIN) 0.05 % nasal spray Place 1 spray into both nostrils 2 (two) times daily as needed for congestion.     pantoprazole  (PROTONIX ) 40 MG tablet Take 1 tablet (40 mg total) by mouth daily. 90 tablet 4   rosuvastatin  (CRESTOR ) 5 MG tablet TAKE 1 TABLET BY MOUTH THREE TIMES A WEEK 45 tablet 2   traMADol  (ULTRAM ) 50 MG tablet TAKE 1 TABLET BY MOUTH TWICE DAILY AS NEEDED FOR MODERATE PAIN 30 tablet 2   vitamin B-12 (CYANOCOBALAMIN ) 500 MCG tablet Take 1 tablet (500 mcg total) by mouth once a week.     apixaban  (ELIQUIS ) 2.5 MG TABS tablet Take 1 tablet (2.5 mg total) by mouth 2 (two) times daily. 60 tablet 6   empagliflozin  (JARDIANCE ) 10 MG TABS tablet Take 1 tablet (10 mg  total) by mouth in the morning. 90 tablet 3   famotidine  (PEPCID ) 20 MG tablet Take 1 tablet (20 mg total) by mouth 2 (two) times daily.     No facility-administered medications prior to visit.     Per HPI unless specifically indicated in ROS section below Review of Systems  Objective:  BP 134/62   Pulse 64   Temp 98.5 F (36.9 C) (Oral)   Ht 5' 4 (1.626 m)   Wt 116 lb (52.6 kg)   SpO2 100%   BMI 19.91 kg/m   Wt Readings from Last 3 Encounters:  01/27/24 116 lb (52.6 kg)  12/19/23 117 lb 8 oz (53.3 kg)  11/27/23 115 lb 12.8 oz (52.5 kg)      Physical Exam Vitals and nursing note reviewed.   Constitutional:      Appearance: Normal appearance. She is not ill-appearing.  HENT:     Mouth/Throat:     Mouth: Mucous membranes are moist.     Pharynx: Oropharynx is clear. No oropharyngeal exudate or posterior oropharyngeal erythema.  Eyes:     Extraocular Movements: Extraocular movements intact.     Conjunctiva/sclera: Conjunctivae normal.     Pupils: Pupils are equal, round, and reactive to light.  Cardiovascular:     Rate and Rhythm: Normal rate and regular rhythm.     Pulses: Normal pulses.     Heart sounds: Normal heart sounds. No murmur heard. Pulmonary:     Effort: Pulmonary effort is normal. No respiratory distress.     Breath sounds: Normal breath sounds. No wheezing, rhonchi or rales.  Abdominal:     General: Bowel sounds are normal. There is no distension.     Palpations: Abdomen is soft. There is mass (AAA graft palpable to epigastric region).     Tenderness: There is abdominal tenderness (mild) in the epigastric area, periumbilical area, suprapubic area and left lower quadrant. There is no guarding or rebound.     Hernia: No hernia is present.  Musculoskeletal:     Right lower leg: No edema.     Left lower leg: No edema.  Skin:    General: Skin is warm and dry.     Findings: No rash.  Neurological:     Mental Status: She is alert.  Psychiatric:        Mood and Affect: Mood normal.        Behavior: Behavior normal.       Results for orders placed or performed in visit on 01/27/24  POCT Urinalysis Dipstick (Automated)   Collection Time: 01/27/24  9:10 AM  Result Value Ref Range   Color, UA yellow    Clarity, UA clear    Glucose, UA Positive (A) Negative   Bilirubin, UA neg    Ketones, UA neg    Spec Grav, UA >=1.030 (A) 1.010 - 1.025   Blood, UA neg    pH, UA 5.5 5.0 - 8.0   Protein, UA Positive (A) Negative   Urobilinogen, UA 0.2 0.2 or 1.0 E.U./dL   Nitrite, UA pos    Leukocytes, UA Small (1+) (A) Negative   Lab Results  Component Value Date    NA 140 12/19/2023   CL 104 12/19/2023   K 3.9 12/19/2023   CO2 23 12/19/2023   BUN 23 12/19/2023   CREATININE 1.15 12/19/2023   GFR 46.23 (L) 12/19/2023   CALCIUM  9.8 12/19/2023   PHOS 2.8 06/12/2023   ALBUMIN 4.7 08/14/2023   GLUCOSE 86 12/19/2023  Lab Results  Component Value Date   WBC 7.0 06/05/2023   HGB 13.2 06/05/2023   HCT 39.6 06/05/2023   MCV 90.7 06/05/2023   PLT 254.0 06/05/2023    Lab Results  Component Value Date   ALT 7 08/14/2023   AST 17 08/14/2023   ALKPHOS 35 (L) 08/14/2023   BILITOT 0.5 08/14/2023   Assessment & Plan:   Problem List Items Addressed This Visit     GERD (gastroesophageal reflux disease)   Continue ppi, pepcid        Relevant Medications   famotidine  (PEPCID ) 20 MG tablet   UTI (urinary tract infection)   Suspect rpt UTI - see above      Relevant Medications   cephALEXin  (KEFLEX ) 500 MG capsule   Chronic kidney disease, stage 3b [facility])   Sees nephrology Dr Marcelino.  Monitor for recurrent UTI on jardiance  use.       Persistent atrial fibrillation (HCC)   Sees afib clinic, she is on Tikosyn  and full dose eliquis       History of repair of aneurysm of abdominal aorta using endovascular stent graft   Amnestic MCI (mild cognitive impairment with memory loss)   Namenda  intolerance. Saw neurology 04/2023 - thought mild vascular dementia. Rec against aricept  in bradycardia. Rec HST however pt denies significant difficulty with sleep, daytime somnolence, significant snoring or witnessed apneas.      Dysuria - Primary   Recurrent dysuria in setting of farxiga start ~11/2023 by nephrology.  UA today suspicious for infection + nitrites, LE UCx sent. Not enough sample to check microscopy today. Update labs. Start keflex  500mg  bid x 1wk. May need to come of jardiance .       Relevant Orders   Urine Culture   Generalized abdominal pain   Update labs, consider further imaging vs return to vascular surgery for eval of AAA s/p EVAR.        Relevant Orders   CBC with Differential/Platelet   Comprehensive metabolic panel with GFR   Lipase   Other Visit Diagnoses       Frequency of urination       Relevant Orders   POCT Urinalysis Dipstick (Automated) (Completed)        Meds ordered this encounter  Medications   famotidine  (PEPCID ) 20 MG tablet    Sig: Take 1 tablet (20 mg total) by mouth daily.   cephALEXin  (KEFLEX ) 500 MG capsule    Sig: Take 1 capsule (500 mg total) by mouth 2 (two) times daily.    Dispense:  14 capsule    Refill:  0    Orders Placed This Encounter  Procedures   Urine Culture   CBC with Differential/Platelet   Comprehensive metabolic panel with GFR   Lipase   POCT Urinalysis Dipstick (Automated)    Patient Instructions  Labs today Urinalysis today  Urine suspicious for infection - will await culture. In the meantime, start keflex  antibiotic sent to [facility] pharmacy.  Continue pantoprazole  40mg  daily before lunch, and pepcid  20mg  nightly.  Pending lab results we may further evaluate with abdominal imaging.   Follow up plan: Return if symptoms worsen or fail to improve.  Anton Blas, MD

## 2024-01-27 NOTE — Assessment & Plan Note (Addendum)
 Sees nephrology Dr Marcelino.  Monitor for recurrent UTI on jardiance  use.

## 2024-01-27 NOTE — Assessment & Plan Note (Addendum)
 Update labs, consider further imaging vs return to vascular surgery for eval of AAA s/p EVAR.

## 2024-01-27 NOTE — Assessment & Plan Note (Addendum)
 Recurrent dysuria in setting of farxiga start ~11/2023 by nephrology.  UA today suspicious for infection + nitrites, LE UCx sent. Not enough sample to check microscopy today. Update labs. Start keflex  500mg  bid x 1wk. May need to come of jardiance .

## 2024-01-28 LAB — URINE CULTURE
MICRO NUMBER:: 16694904
SPECIMEN QUALITY:: ADEQUATE

## 2024-01-30 ENCOUNTER — Ambulatory Visit: Payer: Self-pay

## 2024-01-30 NOTE — Telephone Encounter (Signed)
 Spoke with Mrs. Spainhour and scheduled her with Dr. Watt 02/03/2024 at 11:00 am for knee pain/injection.

## 2024-01-30 NOTE — Telephone Encounter (Signed)
 Would see if Dr Watt could see her any sooner than my appt Tues.  He last saw her for this 08/2023 s/p R knee steroid injection at that time.

## 2024-01-30 NOTE — Telephone Encounter (Signed)
 FYI Only or Action Required?: FYI only for provider.  Patient was last seen in primary care on 01/27/2024 by Rilla Baller, MD.  Called Nurse Triage reporting Knee Pain.  Symptoms began a week ago.  Interventions attempted: OTC medications: Tylenol  .  Symptoms are: gradually worsening.  Triage Disposition: See PCP Within 2 Weeks  Patient/caregiver understands and will follow disposition?: Yes  **Appt. Scheduled for 7/22**                  Copied from CRM [id number]. Topic: Clinical - Red Word Triage >> Jan 30, 2024  3:51 PM Jasmin G wrote: Red Word that prompted transfer to Nurse Triage: Pt. suffers from arthritis, her knee is in severe pain. Pt. is trying to come in and get a shot from doctor to alleviate pain. Reason for Disposition  Knee pain is a chronic symptom (recurrent or ongoing AND present > 4 weeks)  Answer Assessment - Initial Assessment Questions 1. LOCATION and RADIATION: Where is the pain located?       Right knee  2. QUALITY: What does the pain feel like?  (e.g., sharp, dull, aching, burning)     Sharp  3. SEVERITY: How bad is the pain? What does it keep you from doing?   (Scale 1-10; or mild, moderate, severe)    Moderate  4. ONSET: When did the pain start? Does it come and go, or is it there all the time?     X 1 week  5. RECURRENT: Have you had this pain before? If Yes, ask: When, and what happened then?     Yes  6. SETTING: Has there been any recent work, exercise or other activity that involved that part of the body?      No   7. AGGRAVATING FACTORS: What makes the knee pain worse? (e.g., walking, climbing stairs, running)     Walking   8. ASSOCIATED SYMPTOMS: Is there any swelling or redness of the knee?     No   9. OTHER SYMPTOMS: Do you have any other symptoms? (e.g., calf pain, chest pain, difficulty breathing, fever) No    Requesting steroid shot for knee pain. Taking Tylenol  for the pain  currently.  Protocols used: Knee Pain-A-AH

## 2024-01-30 NOTE — Telephone Encounter (Signed)
 Thank you :)

## 2024-01-31 ENCOUNTER — Ambulatory Visit: Payer: Self-pay | Admitting: Family Medicine

## 2024-02-02 NOTE — Progress Notes (Unsigned)
     Coner Gibbard T. Abbe Bula, MD, CAQ Sports Medicine [facility] at [facility] [address] [place], [address]  Phone: [phone]  FAX: [phone]  Ann White - 78 y.o. female  MRN [id number]  Date of Birth: 11/26/1945  Date: 02/03/2024  PCP: Rilla Baller, MD  Referral: Rilla Baller, MD  No chief complaint on file.     ICD-10-CM   1. Primary osteoarthritis of right knee  M17.11      Aspiration/Injection Procedure Note Ann White 09/23/1945 Date of procedure: 02/03/2024  Procedure: Large Joint Aspiration / Injection of Knee, R Indications: Pain  Procedure Details Patient verbally consented to procedure. Risks, benefits, and alternatives explained. Sterilely prepped with Chloraprep. Ethyl cholride used for anesthesia. 9 cc Lidocaine  1% mixed with 1 mL of Kenalog  40 mg injected using the anteromedial approach without difficulty. No complications with procedure and tolerated well. Patient had decreased pain post-injection. Medication: 1 mL of Kenalog  40 mg   Medication Management during today's office visit: No orders of the defined types were placed in this encounter.  There are no discontinued medications.  Orders placed today for conditions managed today: No orders of the defined types were placed in this encounter.   Disposition: No follow-ups on file.  [employer] One speech-to-text software was used for transcription in this dictation.  Possible transcriptional errors can occur using Animal nutritionist.   Signed,  Jacques DASEN. Mayara Paulson, MD   Outpatient Encounter Medications as of 02/03/2024  Medication Sig   acetaminophen  (TYLENOL ) 500 MG tablet Take 500-1,000 mg by mouth every 6 (six) hours as needed (knee pain.).   apixaban  (ELIQUIS ) 5 MG TABS tablet Take 1 tablet (5 mg total) by mouth 2 (two) times daily.   cephALEXin  (KEFLEX ) 500 MG capsule Take 1 capsule (500 mg total) by mouth 2 (two) times daily.    cetirizine (ZYRTEC) 10 MG tablet Take 10 mg by mouth daily as needed for allergies.   clonazePAM  (KLONOPIN ) 0.5 MG tablet Take 0.5 tablets (0.25 mg total) by mouth every other day. At bedtime as needed   dofetilide  (TIKOSYN ) 125 MCG capsule TAKE 1 CAPSULE BY MOUTH TWICE A DAY   empagliflozin  (JARDIANCE ) 10 MG TABS tablet Take 1 tablet (10 mg total) by mouth in the morning.   Evolocumab  (REPATHA  SURECLICK) 140 MG/ML SOAJ Inject 140 mg into the skin every 14 (fourteen) days.   famotidine  (PEPCID ) 20 MG tablet Take 1 tablet (20 mg total) by mouth daily.   Multiple Vitamin (MULTIVITAMIN WITH MINERALS) TABS tablet Take 1 tablet by mouth daily.   oxymetazoline  (AFRIN) 0.05 % nasal spray Place 1 spray into both nostrils 2 (two) times daily as needed for congestion.   pantoprazole  (PROTONIX ) 40 MG tablet Take 1 tablet (40 mg total) by mouth daily.   rosuvastatin  (CRESTOR ) 5 MG tablet TAKE 1 TABLET BY MOUTH THREE TIMES A WEEK   traMADol  (ULTRAM ) 50 MG tablet TAKE 1 TABLET BY MOUTH TWICE DAILY AS NEEDED FOR MODERATE PAIN   vitamin B-12 (CYANOCOBALAMIN ) 500 MCG tablet Take 1 tablet (500 mcg total) by mouth once a week.   No facility-administered encounter medications on file as of 02/03/2024.

## 2024-02-03 ENCOUNTER — Other Ambulatory Visit: Payer: Self-pay

## 2024-02-03 ENCOUNTER — Encounter: Payer: Self-pay | Admitting: Family Medicine

## 2024-02-03 ENCOUNTER — Ambulatory Visit (INDEPENDENT_AMBULATORY_CARE_PROVIDER_SITE_OTHER): Admitting: Family Medicine

## 2024-02-03 VITALS — BP 160/80 | HR 60 | Temp 99.0°F | Ht 64.0 in | Wt 117.4 lb

## 2024-02-03 DIAGNOSIS — M1711 Unilateral primary osteoarthritis, right knee: Secondary | ICD-10-CM | POA: Diagnosis not present

## 2024-02-03 MED ORDER — TRIAMCINOLONE ACETONIDE 40 MG/ML IJ SUSP
40.0000 mg | Freq: Once | INTRAMUSCULAR | Status: AC
  Administered 2024-02-03: 40 mg via INTRA_ARTICULAR

## 2024-02-08 ENCOUNTER — Encounter: Payer: Self-pay | Admitting: Family Medicine

## 2024-02-09 ENCOUNTER — Other Ambulatory Visit: Payer: Self-pay | Admitting: Family Medicine

## 2024-02-09 DIAGNOSIS — G3184 Mild cognitive impairment, so stated: Secondary | ICD-10-CM

## 2024-02-11 ENCOUNTER — Other Ambulatory Visit: Payer: Self-pay | Admitting: Family Medicine

## 2024-02-11 DIAGNOSIS — F5104 Psychophysiologic insomnia: Secondary | ICD-10-CM

## 2024-02-11 NOTE — Telephone Encounter (Unsigned)
 Copied from CRM [id number]. Topic: Clinical - Medication Refill >> Feb 11, 2024  4:30 PM Zebedee SAUNDERS wrote: Medication: clonazePAM  (KLONOPIN ) 0.5 MG tablet  Has the patient contacted their pharmacy? Yes (Agent: If no, request that the patient contact the pharmacy for the refill. If patient does not wish to contact the pharmacy document the reason why and proceed with request.) (Agent: If yes, when and what did the pharmacy advise?)  This is the patient's preferred pharmacy:  [facility] [address], [place] - [address] [place] [address] Phone: [phone] Fax: [phone]  Is this the correct pharmacy for this prescription? Yes If no, delete pharmacy and type the correct one.   Has the prescription been filled recently? Yes  Is the patient out of the medication? Yes  Has the patient been seen for an appointment in the last year OR does the patient have an upcoming appointment? Yes  Can we respond through MyChart? Yes  Agent: Please be advised that Rx refills may take up to 3 business days. We ask that you follow-up with your pharmacy.

## 2024-02-11 NOTE — Telephone Encounter (Signed)
 Name of Medication: clonazePAM  (KLONOPIN ) 0.5 MG tablet  Name of Pharmacy: [facility]  Last Fill or Written Date and Quantity: #30 08/20/23 no rf Last Office Visit and Type: 01/27/24 f/u Next Office Visit and Type: 04/07/24

## 2024-02-12 MED ORDER — CLONAZEPAM 0.5 MG PO TABS
0.2500 mg | ORAL_TABLET | ORAL | 0 refills | Status: DC
Start: 2024-02-12 — End: 2024-02-13

## 2024-02-12 NOTE — Telephone Encounter (Signed)
 ERx

## 2024-02-13 MED ORDER — CLONAZEPAM 0.5 MG PO TABS: 0.2500 mg | ORAL_TABLET | ORAL | 0 refills | Status: AC

## 2024-02-13 NOTE — Telephone Encounter (Signed)
 ERx failure. Will try again today.

## 2024-02-17 ENCOUNTER — Other Ambulatory Visit: Payer: Self-pay

## 2024-03-04 ENCOUNTER — Ambulatory Visit: Admitting: Cardiology

## 2024-03-08 ENCOUNTER — Other Ambulatory Visit: Payer: Self-pay | Admitting: Family Medicine

## 2024-03-08 DIAGNOSIS — I5022 Chronic systolic (congestive) heart failure: Secondary | ICD-10-CM

## 2024-03-09 ENCOUNTER — Ambulatory Visit: Payer: Self-pay

## 2024-03-09 NOTE — Telephone Encounter (Signed)
 FYI Only or Action Required?: FYI only for provider.  Patient was last seen in primary care on 02/03/2024 by Watt Mirza, MD.  Called Nurse Triage reporting Dysuria.  Symptoms began several days ago.  Interventions attempted: Rest, hydration, or home remedies.  Symptoms are: pain and burning with urination, intermittent flank pain unchanged.  Triage Disposition: See Physician Within 24 Hours (overriding See HCP Within 4 Hours (Or PCP Triage))  Patient/caregiver understands and will follow disposition?: Yes            Copied from CRM #[id number]. Topic: Clinical - Red Word Triage >> Mar 09, 2024 10:50 AM Viola FALCON wrote: Red Word that prompted transfer to Nurse Triage: Patient is having pain when she urinates - requested an appt Reason for Disposition  Side (flank) or lower back pain present  Answer Assessment - Initial Assessment Questions Patient states she has someone coming out to deliver a new water heater to their house today and they don't know what time they are coming so she can't be seen today with her PCP. Scheduled patient for tomorrow morning.  1. SEVERITY: How bad is the pain?  (e.g., Scale 1-10; mild, moderate, or severe)     Pain/burning; moderate. Worse at night when lying down.  2. FREQUENCY: How many times have you had painful urination today?      2-3 times since waking up this morning.  3. PATTERN: Is pain present every time you urinate or just sometimes?      Yes.  4. ONSET: When did the painful urination start?      4-5 days.  5. FEVER: Do you have a fever? If Yes, ask: What is your temperature, how was it measured, and when did it start?     No.  6. PAST UTI: Have you had a urine infection before? If Yes, ask: When was the last time? and What happened that time?      Yes, but states it has been a while.  7. CAUSE: What do you think is causing the painful urination?  (e.g., UTI, scratch, Herpes sore)     UTI.  8.  OTHER SYMPTOMS: Do you have any other symptoms? (e.g., blood in urine, flank pain, genital sores, urgency, vaginal discharge)     Denies nausea, vomiting, back pain, vaginal discharge. States maybe once in a while not very much flank pain.  9. PREGNANCY: Is there any chance you are pregnant? When was your last menstrual period?     N/A.  Protocols used: Urination Pain - Female-A-AH

## 2024-03-09 NOTE — Telephone Encounter (Signed)
 Noted--I will check her at tomorrow's visit

## 2024-03-10 ENCOUNTER — Ambulatory Visit (INDEPENDENT_AMBULATORY_CARE_PROVIDER_SITE_OTHER): Admitting: Internal Medicine

## 2024-03-10 ENCOUNTER — Other Ambulatory Visit: Payer: Self-pay | Admitting: Family Medicine

## 2024-03-10 ENCOUNTER — Encounter: Payer: Self-pay | Admitting: Internal Medicine

## 2024-03-10 ENCOUNTER — Ambulatory Visit: Payer: Self-pay | Admitting: Internal Medicine

## 2024-03-10 VITALS — BP 132/84 | HR 61 | Temp 97.8°F | Ht 64.0 in | Wt 116.0 lb

## 2024-03-10 DIAGNOSIS — N3000 Acute cystitis without hematuria: Secondary | ICD-10-CM | POA: Insufficient documentation

## 2024-03-10 DIAGNOSIS — I5022 Chronic systolic (congestive) heart failure: Secondary | ICD-10-CM

## 2024-03-10 LAB — POC URINALSYSI DIPSTICK (AUTOMATED)
Bilirubin, UA: NEGATIVE
Blood, UA: NEGATIVE
Glucose, UA: NEGATIVE
Ketones, UA: NEGATIVE
Nitrite, UA: POSITIVE
Protein, UA: POSITIVE — AB
Spec Grav, UA: >=1.03 — AB (ref 1.010–1.025)
Urobilinogen, UA: 0.2 U/dL
pH, UA: 6 (ref 5.0–8.0)

## 2024-03-10 MED ORDER — CEPHALEXIN 500 MG PO CAPS: 500.0000 mg | ORAL_CAPSULE | Freq: Three times a day (TID) | ORAL | 1 refills | Status: DC

## 2024-03-10 NOTE — Addendum Note (Signed)
 Addended by: KALLIE CLOTILDA SQUIBB on: 03/10/2024 09:57 AM   Modules accepted: Orders

## 2024-03-10 NOTE — Assessment & Plan Note (Addendum)
 Fairly classic symptoms No systemic symptoms Urinalysis ---2+ leuks, faint positive nitrites Past cultures E coli with one septra resistance and one nitrofurantoin   Will treat empirically with cephalexin  again 500 tid x 3 days Send culture--just in case

## 2024-03-10 NOTE — Progress Notes (Signed)
 Subjective:    Patient ID: Ann White, female    DOB: [birth year], 78 y.o.   MRN: [id number]  HPI Here due to urinary symptoms With husband  She is having burning dysuria---started 2 weeks ago No Rx No fever No abdominal pain  No vaginal products or changes  Current Outpatient Medications on File Prior to Visit  Medication Sig Dispense Refill   acetaminophen  (TYLENOL ) 500 MG tablet Take 500-1,000 mg by mouth every 6 (six) hours as needed (knee pain.).     apixaban  (ELIQUIS ) 5 MG TABS tablet Take 1 tablet (5 mg total) by mouth 2 (two) times daily. 90 tablet 3   cetirizine (ZYRTEC) 10 MG tablet Take 10 mg by mouth daily as needed for allergies.     clonazePAM  (KLONOPIN ) 0.5 MG tablet Take 0.5 tablets (0.25 mg total) by mouth every other day. At bedtime as needed 30 tablet 0   dofetilide  (TIKOSYN ) 125 MCG capsule TAKE 1 CAPSULE BY MOUTH TWICE A DAY 180 capsule 3   empagliflozin  (JARDIANCE ) 10 MG TABS tablet Take 1 tablet (10 mg total) by mouth in the morning. 90 tablet 3   Evolocumab  (REPATHA  SURECLICK) 140 MG/ML SOAJ Inject 140 mg into the skin every 14 (fourteen) days. 6 mL 3   famotidine  (PEPCID ) 20 MG tablet Take 1 tablet (20 mg total) by mouth daily.     Multiple Vitamin (MULTIVITAMIN WITH MINERALS) TABS tablet Take 1 tablet by mouth daily.     oxymetazoline  (AFRIN) 0.05 % nasal spray Place 1 spray into both nostrils 2 (two) times daily as needed for congestion.     pantoprazole  (PROTONIX ) 40 MG tablet Take 1 tablet (40 mg total) by mouth daily. 90 tablet 4   rosuvastatin  (CRESTOR ) 5 MG tablet TAKE 1 TABLET BY MOUTH THREE TIMES A WEEK 45 tablet 2   traMADol  (ULTRAM ) 50 MG tablet TAKE 1 TABLET BY MOUTH TWICE DAILY AS NEEDED FOR MODERATE PAIN 30 tablet 2   vitamin B-12 (CYANOCOBALAMIN ) 500 MCG tablet Take 1 tablet (500 mcg total) by mouth once a week.     No current facility-administered medications on file prior to visit.    Allergies  Allergen Reactions   Sulfa  Antibiotics Swelling    Oral swelling   Nexletol  [Bempedoic Acid ]     Dizziness, eyes felt funny   Tricor  [Fenofibrate ] Other (See Comments)    myalgias   Zetia  [Ezetimibe ] Other (See Comments)    Dizziness   Contrast Media [Iodinated Contrast Media] Other (See Comments)    Oral contrast caused mouth blisters   Lipitor [Atorvastatin ] Other (See Comments)    myalgias   Pravastatin  Other (See Comments)    myalgias    Past Medical History:  Diagnosis Date   AAA (abdominal aortic aneurysm) (HCC)    Allergy    Anxiety    Arthritis    Carotid stenosis    a. 02/2016 Carotid U/S: Bilateral 40-59%; b. 03/2017 Carotid U/S: RICA 40-59%, LICA 1-39%; c. 04/2019 Carotid U/S: bilat 40-59% ICA dzs.   Cervical spondylosis    s/p spine injections   CKD (chronic kidney disease), stage III (HCC)    Depression    DVT (deep venous thrombosis) (HCC)    RLE DVT 05/27/15   Dysrhythmia    afib   Ectatic abdominal aorta (HCC) 2016   rpt US  5 yrs   GERD (gastroesophageal reflux disease)    History of chicken pox    History of diverticulitis of colon    HLD (  hyperlipidemia)    HTN (hypertension)    Irritable bowel syndrome with constipation    Lichen sclerosus et atrophicus    NICM (nonischemic cardiomyopathy) (HCC)    a. 04/2017 Echo: EF 35-40%, diff HK; b. 05/2017 MV: EF 56%, no ischemia/infarct; c. 04/2018 Echo: EF 55-60%, no rwma, Gr2 DD; d. 03/2020 Echo: EF 40-45%, nl PASP, mod dil LA, mild to mod dil RA, Triv MR.   Obesity    Persistent atrial fibrillation (HCC)    a. Dx 03/2020. CHA2DS2VASc = 5-->xarelto  15mg  daily.   Premature atrial contraction    a. 04/2017 48hr Holter: Predominant rhythm - sinus. Rare PVC's, freq PAC's with freq atrial runs up to 34 beats-->metoprolol  started.   Renal insufficiency    Seasonal allergic rhinitis     Past Surgical History:  Procedure Laterality Date   ABDOMINAL AORTIC ANEURYSM REPAIR  09/2021   [facility]   ABDOMINAL AORTIC ENDOVASCULAR STENT GRAFT   10/03/2021   Procedure: ABDOMINAL AORTIC ENDOVASCULAR STENT GRAFT;  Surgeon: Sheree Penne Bruckner, MD;  Location[facility] OR;  Service: Vascular;;   bladder tack  1990s   BREAST BIOPSY Right 03/05/2012    benign   CARDIOVERSION N/A 05/17/2020   Procedure: CARDIOVERSION;  Surgeon: Mady Bruckner, MD;  Location: [facility] ORS;  Service: Cardiovascular;  Laterality: N/A;   CARPAL TUNNEL RELEASE  2004, 2013   bilateral   CHOLECYSTECTOMY  1990s   COLONOSCOPY  2002   COLONOSCOPY  06/2013   mod diverticulosis, 3 tubular adenomas, int hem rpt 5 yrs [facility])   COLONOSCOPY  06/2018   TA,c olonic angiodysplastic lesion, mod diverticulosis, rpt 5 yrs [facility])   DEXA  07/2011   normal, T score -0.3   ESOPHAGOGASTRODUODENOSCOPY ENDOSCOPY  2004   normal ([facility])   TOTAL ABDOMINAL HYSTERECTOMY W/ BILATERAL SALPINGOOPHORECTOMY  1990s   complete, dysmenorrhea and fibroids   ULTRASOUND GUIDANCE FOR VASCULAR ACCESS  10/03/2021   Procedure: ULTRASOUND GUIDANCE FOR VASCULAR ACCESS;  Surgeon: Sheree Penne Bruckner, MD;  Location: [facility] OR;  Service: Vascular;;    Family History  Problem Relation Age of Onset   CAD Mother 57       MI   Hypertension Mother    Parkinson's disease Father    Diabetes Father    CAD Father 22       MI   Parkinson's disease Sister    Hyperlipidemia Sister    Memory loss Paternal Aunt    Cancer Neg Hx    Stroke Neg Hx    Colon cancer Neg Hx    Breast cancer Neg Hx    Colon polyps Neg Hx    Esophageal cancer Neg Hx    Stomach cancer Neg Hx    Rectal cancer Neg Hx     Social History   Socioeconomic History   Marital status: Married    Spouse name: Freddie   Number of children: 2   Years of education: Not on file   Highest education level: Not on file  Occupational History   Occupation: retired   Tobacco Use   Smoking status: Former    Current packs/day: 0.00    Average packs/day: 0.3 packs/day for 3.0 years (0.8 ttl pk-yrs)    Types: Cigarettes    Start date:  35    Quit date: 1990    Years since quitting: 35.6   Smokeless tobacco: Never   Tobacco comments:    minimal smoking history  Vaping Use   Vaping status: Never Used  Substance and Sexual  Activity   Alcohol use: No    Alcohol/week: 0.0 standard drinks of alcohol   Drug use: No   Sexual activity: Not Currently  Other Topics Concern   Not on file  Social History Narrative   Lives with husband and 2 cats.  2 grown children, 5 grandchildren.   Occupation: retired, worked in [employer]   Activity: no regular exercise.  Does walk in summer   Diet: fruits/vegetables daily, good water.   Social Drivers of Corporate investment banker Strain: Low Risk  (07/15/2023)   Overall Financial Resource Strain (CARDIA)    Difficulty of Paying Living Expenses: Not very hard  Food Insecurity: No Food Insecurity (07/15/2023)   Hunger Vital Sign    Worried About Running Out of Food in the Last Year: Never true    Ran Out of Food in the Last Year: Never true  Transportation Needs: No Transportation Needs (07/15/2023)   PRAPARE - Administrator, Civil Service (Medical): No    Lack of Transportation (Non-Medical): No  Physical Activity: Inactive (07/15/2023)   Exercise Vital Sign    Days of Exercise per Week: 0 days    Minutes of Exercise per Session: 0 min  Stress: No Stress Concern Present (07/15/2023)   [employer] of Occupational Health - Occupational Stress Questionnaire    Feeling of Stress : Not at all  Social Connections: Moderately Integrated (07/15/2023)   Social Connection and Isolation Panel    Frequency of Communication with Friends and Family: Three times a week    Frequency of Social Gatherings with Friends and Family: Three times a week    Attends Religious Services: More than 4 times per year    Active Member of Clubs or Organizations: No    Attends Banker Meetings: Never    Marital Status: Married  Catering manager Violence: Not At Risk (07/15/2023)    Humiliation, Afraid, Rape, and Kick questionnaire    Fear of Current or Ex-Partner: No    Emotionally Abused: No    Physically Abused: No    Sexually Abused: No   Review of Systems No N/V Eating okay No back pain    Objective:   Physical Exam Constitutional:      Appearance: Normal appearance.  Abdominal:     General: There is no distension.     Palpations: Abdomen is soft.     Tenderness: There is no abdominal tenderness. There is no right CVA tenderness, left CVA tenderness or guarding.  Neurological:     Mental Status: She is alert.            Assessment & Plan:

## 2024-03-11 ENCOUNTER — Other Ambulatory Visit: Payer: Self-pay

## 2024-03-11 ENCOUNTER — Other Ambulatory Visit: Payer: Self-pay | Admitting: Family Medicine

## 2024-03-11 NOTE — Telephone Encounter (Signed)
 Received refill request from pharmacy. Looks like recently given Pepcid  ok to refill protonix  as well?

## 2024-03-11 NOTE — Telephone Encounter (Signed)
 Last given by another provider. Ok to refill?

## 2024-03-12 LAB — URINE CULTURE
MICRO NUMBER:: 16884584
SPECIMEN QUALITY:: ADEQUATE

## 2024-03-13 MED ORDER — PANTOPRAZOLE SODIUM 40 MG PO TBEC: 40.0000 mg | DELAYED_RELEASE_TABLET | Freq: Every day | ORAL | 1 refills | Status: AC

## 2024-03-13 NOTE — Telephone Encounter (Signed)
 1 year supply was refilled by cardiology office on 08/2023. Can we call pharmacy to ask why this refill is needed so soon? If needed, request will need to go to cardiology

## 2024-03-13 NOTE — Telephone Encounter (Signed)
 I called [facility] pharmacy - she is on 5mg  eliquis  dose - [facility] is [facility] - no refill needed at this time.

## 2024-03-13 NOTE — Telephone Encounter (Signed)
 ERx Rec sparing use in CKD hx.

## 2024-03-17 NOTE — Progress Notes (Deleted)
 Electrophysiology Clinic Note    Date:  03/17/2024  Patient ID:  Ann, White 1946/02/18, MRN [id number] PCP:  Rilla Baller, MD  Cardiologist:  Lonni Hanson, MD   Electrophysiologist:  OLE ONEIDA HOLTS, MD  Electrophysiology APP:  Lexus Shampine, NP    ***refresh  Discussed the use of AI scribe software for clinical note transcription with the patient, who gave verbal consent to proceed.   Patient Profile    Chief Complaint: AF, tikosyn  follow-up  History of Present Illness: Ann White is a 78 y.o. female with PMH notable for persis afib, NICM, HFimpEF, AAA s/p EVAR, CKD - 3b, HTN ; seen today for OLE ONEIDA HOLTS, MD for routine electrophysiology followup.   I last saw her 11/2023 where she was doing well without AF episodes on tikosyn .   On follow-up today, *** AF burden, symptoms *** palpitations *** bleeding concerns  - need BMP, mag  Since last being seen in our clinic the patient reports doing ***.  she denies chest pain, palpitations, dyspnea, PND, orthopnea, nausea, vomiting, dizziness, syncope, edema, weight gain, or early satiety.      Arrhythmia/Device History Tikosyn      ROS:  Please see the history of present illness. All other systems are reviewed and otherwise negative.    Physical Exam    VS:  There were no vitals taken for this visit. BMI: There is no height or weight on file to calculate BMI.      Wt Readings from Last 3 Encounters:  03/10/24 116 lb (52.6 kg)  02/03/24 117 lb 6 oz (53.2 kg)  01/27/24 116 lb (52.6 kg)     GEN- The patient is well appearing, alert and oriented x 3 today.   Lungs- Clear to ausculation bilaterally, normal work of breathing.  Heart- {Blank single:19197::Regular,Irregularly irregular} rate and rhythm, no murmurs, rubs or gallops Extremities- {EDEMA LEVEL:28147::No} peripheral edema, warm, dry   Studies Reviewed   Previous EP, cardiology notes.    EKG is ordered.  Personal review of EKG from today shows:  ***        11/2023 EKG - SB at 51; QTC 08/30/2023 EKG - SB at 56; QT 450, QTC 05/2023 EKG - SR at 67; QT , QTC 05/2022 EKG - AFib at 76; QT 400; QTC  TTE, 09/20/2020  1. Left ventricular ejection fraction, by estimation, is 60 to 65%. Left ventricular ejection fraction by 3D volume is 61 %. The left ventricle has normal function. The left ventricle has no regional wall motion abnormalities. Left ventricular diastolic  parameters are consistent with Grade I diastolic dysfunction (impaired relaxation).   2. Right ventricular systolic function is normal. The right ventricular size is normal. There is normal pulmonary artery systolic pressure. The estimated right ventricular systolic pressure is 30.0 mmHg.   3. Left atrial size was severely dilated.   4. Right atrial size was mild to moderately dilated.   5. The mitral valve is grossly normal. Trivial mitral valve regurgitation. No evidence of mitral stenosis.   6. The aortic valve is tricuspid. Aortic valve regurgitation is not visualized. No aortic stenosis is present.   7. The inferior vena cava is normal in size with greater than 50% respiratory variability, suggesting right atrial pressure of 3 mmHg.   Comparison(s): Changes from prior study are noted. EF has improved to  60-65%. NSR has replaced Afib.    TTE, 04/14/2020  1. Left ventricular ejection fraction, by estimation,  is 40 to 45%. The left ventricle has mild to moderately decreased function. The left ventricle has no regional wall motion abnormalities. Indeterminate diastolic filling due to E-A fusion.   2. Right ventricular systolic function is normal. The right ventricular size is normal. There is normal pulmonary artery systolic pressure.   3. Left atrial size was moderately dilated.   4. Right atrial size was mild to moderately dilated.   5. The mitral valve is normal in structure. Trivial mitral valve  regurgitation. No evidence of mitral stenosis.   6. The aortic valve is normal in structure. Aortic valve regurgitation is not visualized. No aortic stenosis is present.   7. The inferior vena cava is normal in size with greater than 50% respiratory variability, suggesting right atrial pressure of 3 mmHg.    Assessment and Plan     #) persis AFib #) tikosyn  use  Maintaining sinus rhythm QTC stable on tikosyn  q12h Update BMP, mag today  #) Hypercoag d/t persis afib CHA2DS2-VASc Score = at least 4 [CHF History: 0, HTN History: 1, Diabetes History: 0, Stroke History: 0, Vascular Disease History: 0, Age Score: 2, Gender Score: 1].  Therefore, the patient's annual risk of stroke is 4.8 %.     {Confirm score is correct.  If not, click here to update score.  REFRESH note.  :1}   Stroke ppx - ***, appropriately dosed No bleeding concerns  #) HFimpEF   #) ***   {Are you ordering a CV Procedure (e.g. stress test, cath, DCCV, TEE, etc)?   Press F2        :[id number]}   Current medicines are reviewed at length with the patient today.   The patient {ACTIONS; HAS/DOES NOT HAVE:19233} concerns regarding her medicines.  The following changes were made today:  {NONE DEFAULTED:18576}  Labs/ tests ordered today include: *** No orders of the defined types were placed in this encounter.    Disposition: Follow up with {EPMDS:28135::EP Team} or EP APP {EPFOLLOW UP:28173}   Signed, Chantal Needle, NP  03/17/24  12:16 PM  Electrophysiology [facility]

## 2024-03-18 ENCOUNTER — Ambulatory Visit: Attending: Cardiology | Admitting: Cardiology

## 2024-03-18 DIAGNOSIS — Z79899 Other long term (current) drug therapy: Secondary | ICD-10-CM

## 2024-03-18 DIAGNOSIS — D6869 Other thrombophilia: Secondary | ICD-10-CM

## 2024-03-18 DIAGNOSIS — I4819 Other persistent atrial fibrillation: Secondary | ICD-10-CM

## 2024-04-07 ENCOUNTER — Encounter: Payer: Self-pay | Admitting: Family Medicine

## 2024-04-07 ENCOUNTER — Ambulatory Visit: Admitting: Family Medicine

## 2024-04-07 VITALS — BP 118/62 | HR 56 | Temp 97.9°F | Ht 64.0 in | Wt 118.1 lb

## 2024-04-07 DIAGNOSIS — I7143 Infrarenal abdominal aortic aneurysm, without rupture: Secondary | ICD-10-CM | POA: Diagnosis not present

## 2024-04-07 DIAGNOSIS — R109 Unspecified abdominal pain: Secondary | ICD-10-CM

## 2024-04-07 DIAGNOSIS — G3184 Mild cognitive impairment, so stated: Secondary | ICD-10-CM | POA: Diagnosis not present

## 2024-04-07 DIAGNOSIS — I4819 Other persistent atrial fibrillation: Secondary | ICD-10-CM

## 2024-04-07 DIAGNOSIS — L900 Lichen sclerosus et atrophicus: Secondary | ICD-10-CM | POA: Diagnosis not present

## 2024-04-07 DIAGNOSIS — N1832 Chronic kidney disease, stage 3b: Secondary | ICD-10-CM

## 2024-04-07 DIAGNOSIS — R300 Dysuria: Secondary | ICD-10-CM

## 2024-04-07 DIAGNOSIS — I428 Other cardiomyopathies: Secondary | ICD-10-CM

## 2024-04-07 LAB — POC URINALSYSI DIPSTICK (AUTOMATED)
Bilirubin, UA: NEGATIVE
Blood, UA: NEGATIVE
Glucose, UA: POSITIVE — AB
Ketones, UA: NEGATIVE
Leukocytes, UA: NEGATIVE
Nitrite, UA: NEGATIVE
Protein, UA: NEGATIVE
Spec Grav, UA: 1.02 (ref 1.010–1.025)
Urobilinogen, UA: NEGATIVE U/dL — AB
pH, UA: 5.5 (ref 5.0–8.0)

## 2024-04-07 NOTE — Patient Instructions (Addendum)
 Urine test today for ongoing burning with urination. If recurrent infection, I may have you return to see [facility] urologist in [place].  You saw memory doctor last October, she recommended proceeding with sleep apnea evaluation. Let me know if you'd like to do this with a home sleep test.  Good to see you today Return in 3 months for follow up visit

## 2024-04-07 NOTE — Progress Notes (Unsigned)
 Ph: [phone] Fax: [phone]   Patient ID: Ann White, female    DOB: [birth year], 78 y.o.   MRN: [id number]  This visit was conducted in person.  BP 118/62   Pulse (!) 56   Temp 97.9 F (36.6 C) (Oral)   Ht 5' 4 (1.626 m)   Wt 118 lb 2 oz (53.6 kg)   SpO2 98%   BMI 20.28 kg/m    CC: 3 mo f/u visit  Subjective:   HPI: Ann White is a 78 y.o. female presenting on 04/07/2024 for Medical Management of Chronic Issues (Pt is here for a 3 mo f/u)   E coli UTI 82/025 treated with keflex  500mg  3d course. She notes symptoms never fully improved.  She continues having dysuria, also with vaginal burning.  No discharge or rash. Mild itchy.  No urgency, frequency, suprapubic abd pain, flank pain, fevers/chills, nausea/vomiting.   Tolerating meds well otherwise, taking Eliquis  5mg  bid, Tikosyn , Jradiance and Repatha  + Crestor  regularly.   CKD - upcoming nephrology appt 9/29.   Husband notes ongoing memory trouble.      Relevant past medical, surgical, family and social history reviewed and updated as indicated. Interim medical history since our last visit reviewed. Allergies and medications reviewed and updated. Outpatient Medications Prior to Visit  Medication Sig Dispense Refill   acetaminophen  (TYLENOL ) 500 MG tablet Take 500-1,000 mg by mouth every 6 (six) hours as needed (knee pain.).     apixaban  (ELIQUIS ) 5 MG TABS tablet Take 1 tablet (5 mg total) by mouth 2 (two) times daily. 90 tablet 3   cephALEXin  (KEFLEX ) 500 MG capsule Take 1 capsule (500 mg total) by mouth 3 (three) times daily. 9 capsule 1   cetirizine (ZYRTEC) 10 MG tablet Take 10 mg by mouth daily as needed for allergies.     clonazePAM  (KLONOPIN ) 0.5 MG tablet Take 0.5 tablets (0.25 mg total) by mouth every other day. At bedtime as needed 30 tablet 0   dofetilide  (TIKOSYN ) 125 MCG capsule TAKE 1 CAPSULE BY MOUTH TWICE A DAY 180 capsule 3   empagliflozin  (JARDIANCE ) 10 MG TABS tablet Take 1  tablet (10 mg total) by mouth in the morning. 90 tablet 3   Evolocumab  (REPATHA  SURECLICK) 140 MG/ML SOAJ Inject 140 mg into the skin every 14 (fourteen) days. 6 mL 3   famotidine  (PEPCID ) 20 MG tablet Take 1 tablet (20 mg total) by mouth daily.     Multiple Vitamin (MULTIVITAMIN WITH MINERALS) TABS tablet Take 1 tablet by mouth daily.     oxymetazoline  (AFRIN) 0.05 % nasal spray Place 1 spray into both nostrils 2 (two) times daily as needed for congestion.     pantoprazole  (PROTONIX ) 40 MG tablet Take 1 tablet (40 mg total) by mouth daily. 90 tablet 1   rosuvastatin  (CRESTOR ) 5 MG tablet TAKE 1 TABLET BY MOUTH THREE TIMES A WEEK 45 tablet 2   traMADol  (ULTRAM ) 50 MG tablet TAKE 1 TABLET BY MOUTH TWICE DAILY AS NEEDED FOR MODERATE PAIN 30 tablet 2   vitamin B-12 (CYANOCOBALAMIN ) 500 MCG tablet Take 1 tablet (500 mcg total) by mouth once a week.     No facility-administered medications prior to visit.     Per HPI unless specifically indicated in ROS section below Review of Systems  Objective:  BP 118/62   Pulse (!) 56   Temp 97.9 F (36.6 C) (Oral)   Ht 5' 4 (1.626 m)   Wt 118 lb 2 oz (53.6  kg)   SpO2 98%   BMI 20.28 kg/m   Wt Readings from Last 3 Encounters:  04/07/24 118 lb 2 oz (53.6 kg)  03/10/24 116 lb (52.6 kg)  02/03/24 117 lb 6 oz (53.2 kg)      Physical Exam    Results for orders placed or performed in visit on 03/10/24  POCT Urinalysis Dipstick (Automated)   Collection Time: 03/10/24  9:54 AM  Result Value Ref Range   Color, UA yellow    Clarity, UA clear    Glucose, UA Negative Negative   Bilirubin, UA negative    Ketones, UA negative    Spec Grav, UA >=1.030 (A) 1.010 - 1.025   Blood, UA negative    pH, UA 6.0 5.0 - 8.0   Protein, UA Positive (A) Negative   Urobilinogen, UA 0.2 0.2 or 1.0 E.U./dL   Nitrite, UA positive    Leukocytes, UA Moderate (2+) (A) Negative  Urine Culture   Collection Time: 03/10/24  9:58 AM   Specimen: Urine  Result Value Ref  Range   MICRO NUMBER: [id number]    SPECIMEN QUALITY: Adequate    Sample Source NOT GIVEN    STATUS: FINAL    ISOLATE 1: Escherichia coli (A)       Susceptibility   Escherichia coli - URINE CULTURE, REFLEX    AMOX/CLAVULANIC <=2 Sensitive     AMPICILLIN/SULBACTAM <=2 Sensitive     CEFAZOLIN * <=1 Not Reportable      * For infections other than uncomplicated UTI caused by E. coli, K. pneumoniae or P. mirabilis: Cefazolin  is resistant if MIC > or = 8 mcg/mL. (Distinguishing susceptible versus intermediate for isolates with MIC < or = 4 mcg/mL requires additional testing.) For uncomplicated UTI caused by E. coli, K. pneumoniae or P. mirabilis: Cefazolin  is susceptible if MIC <32 mcg/mL and predicts susceptible to the oral agents cefaclor, cefdinir , cefpodoxime, cefprozil, cefuroxime, cephalexin  and loracarbef.     CEFTAZIDIME <=0.5 Sensitive     CEFEPIME <=0.12 Sensitive     CEFTRIAXONE <=0.25 Sensitive     CIPROFLOXACIN  <=0.06 Sensitive     LEVOFLOXACIN <=0.12 Sensitive     GENTAMICIN <=1 Sensitive     IMIPENEM <=0.25 Sensitive     MEROPENEM <=0.25 Sensitive     NITROFURANTOIN  <=16 Sensitive     PIP/TAZO <=4 Sensitive     TRIMETH/SULFA* <=20 Sensitive      * For infections other than uncomplicated UTI caused by E. coli, K. pneumoniae or P. mirabilis: Cefazolin  is resistant if MIC > or = 8 mcg/mL. (Distinguishing susceptible versus intermediate for isolates with MIC < or = 4 mcg/mL requires additional testing.) For uncomplicated UTI caused by E. coli, K. pneumoniae or P. mirabilis: Cefazolin  is susceptible if MIC <32 mcg/mL and predicts susceptible to the oral agents cefaclor, cefdinir , cefpodoxime, cefprozil, cefuroxime, cephalexin  and loracarbef. Legend: S = Susceptible  I = Intermediate R = Resistant  NS = Not susceptible SDD = Susceptible Dose Dependent * = Not Tested  NR = Not Reported **NN = See Therapy Comments    Lab Results  Component Value Date   NA 141  01/27/2024   CL 104 01/27/2024   K 4.3 01/27/2024   CO2 27 01/27/2024   BUN 25 (H) 01/27/2024   CREATININE 1.34 (H) 01/27/2024   GFR 38.45 (L) 01/27/2024   CALCIUM  9.6 01/27/2024   PHOS 2.8 06/12/2023   ALBUMIN 4.8 01/27/2024   GLUCOSE 87 01/27/2024    Assessment & Plan:  Problem List Items Addressed This Visit   None    No orders of the defined types were placed in this encounter.   No orders of the defined types were placed in this encounter.   There are no Patient Instructions on file for this visit.  Follow up plan: No follow-ups on file.  Anton Blas, MD

## 2024-04-08 ENCOUNTER — Ambulatory Visit: Payer: Self-pay | Admitting: Family Medicine

## 2024-04-08 MED ORDER — MICONAZOLE NITRATE 2 % EX CREA: 1.0000 | TOPICAL_CREAM | Freq: Two times a day (BID) | CUTANEOUS | 0 refills | Status: AC

## 2024-04-08 NOTE — Assessment & Plan Note (Addendum)
 Both with urination but also external without urination. External vaginal exam with erythema to vulva/labia majora. UA today without obvious infection. She has received recent antibiotics.  ERx monistat  externally BID x7d for possible candidal infection, update with effect. Avoid oral diflucan  due to Tikosyn  interaction.  Consider topical steroid treatment for possible lichen sclerosus as per below.  She is on jardiance  - will also trial off this.

## 2024-04-08 NOTE — Assessment & Plan Note (Signed)
 Will be due for [facility] f/u next month - rec call and schedule appt.

## 2024-04-08 NOTE — Assessment & Plan Note (Signed)
 No pain today but husband endorses she has frequent lower abdominal discomfort  Consider re eval of AAA graft/stent - [facility] apt due on 04/2024.

## 2024-04-08 NOTE — Assessment & Plan Note (Signed)
 Namenda  intolerance. Avoiding donepezil  in cardiac history and bradycardia.  Saw neurology x1 (04/2023) thought vascular dementia - rec HST but pt declined at that time.  Ongoing difficulty noted predominantly by family.

## 2024-04-08 NOTE — Assessment & Plan Note (Addendum)
 Carries this diagnosis - and this could contribute to above symptoms. Consider topical steroid treatment if topical monistat  doesn't resolve symptoms vs GYN eval.

## 2024-04-08 NOTE — Assessment & Plan Note (Signed)
 Appreciate nephrology care. Pending appt next week.

## 2024-04-13 ENCOUNTER — Other Ambulatory Visit: Payer: Self-pay

## 2024-04-13 DIAGNOSIS — N179 Acute kidney failure, unspecified: Secondary | ICD-10-CM | POA: Diagnosis not present

## 2024-04-13 DIAGNOSIS — I10 Essential (primary) hypertension: Secondary | ICD-10-CM | POA: Diagnosis not present

## 2024-04-13 DIAGNOSIS — N3000 Acute cystitis without hematuria: Secondary | ICD-10-CM | POA: Diagnosis not present

## 2024-04-13 DIAGNOSIS — N1831 Chronic kidney disease, stage 3a: Secondary | ICD-10-CM | POA: Diagnosis not present

## 2024-05-04 ENCOUNTER — Other Ambulatory Visit (HOSPITAL_COMMUNITY): Payer: Self-pay

## 2024-05-26 NOTE — Progress Notes (Unsigned)
     Ann White T. John Williamsen, MD, CAQ Sports Medicine [facility] [address] [place], [address]  Phone: [phone]  FAX: [phone]  Ann White - 78 y.o. female  MRN [id number]  Date of Birth: Nov 27, 1945  Date: 05/28/2024  PCP: Rilla Baller, MD  Referral: Rilla Baller, MD  No chief complaint on file.   [name] presents with some ongoing chronic right-sided knee pain chronic osteoarthritis.    ICD-10-CM   1. Primary osteoarthritis of right knee  M17.11      Assessment & Plan   Aspiration/Injection Procedure Note Ann White Nov 18, 1945 Date of procedure: 05/28/2024  Procedure: Large Joint Aspiration / Injection of Knee, R Indications: Pain  Procedure Details Patient verbally consented to procedure. Risks, benefits, and alternatives explained. Sterilely prepped with Chloraprep. Ethyl cholride used for anesthesia. 9 cc Lidocaine  1% mixed with 1 mL of Kenalog  40 mg injected using the anteromedial approach without difficulty. No complications with procedure and tolerated well. Patient had decreased pain post-injection. Medication: 1 mL of Kenalog  40 mg   Medication Management during today's office visit: No orders of the defined types were placed in this encounter.  There are no discontinued medications.  Orders placed today for conditions managed today: No orders of the defined types were placed in this encounter.   Disposition: No follow-ups on file.  [employer] One speech-to-text software was used for transcription in this dictation.  Possible transcriptional errors can occur using Animal nutritionist.   Signed,  Jacques DASEN. Myrtle Haller, MD   Outpatient Encounter Medications as of 05/28/2024  Medication Sig   acetaminophen  (TYLENOL ) 500 MG tablet Take 500-1,000 mg by mouth every 6 (six) hours as needed (knee pain.).   apixaban  (ELIQUIS ) 5 MG TABS tablet Take 1 tablet (5 mg total) by mouth 2 (two) times  daily.   cephALEXin  (KEFLEX ) 500 MG capsule Take 1 capsule (500 mg total) by mouth 3 (three) times daily.   cetirizine (ZYRTEC) 10 MG tablet Take 10 mg by mouth daily as needed for allergies.   clonazePAM  (KLONOPIN ) 0.5 MG tablet Take 0.5 tablets (0.25 mg total) by mouth every other day. At bedtime as needed   dofetilide  (TIKOSYN ) 125 MCG capsule TAKE 1 CAPSULE BY MOUTH TWICE A DAY   empagliflozin  (JARDIANCE ) 10 MG TABS tablet Take 1 tablet (10 mg total) by mouth in the morning.   Evolocumab  (REPATHA  SURECLICK) 140 MG/ML SOAJ Inject 140 mg into the skin every 14 (fourteen) days.   famotidine  (PEPCID ) 20 MG tablet Take 1 tablet (20 mg total) by mouth daily.   miconazole  (MICOTIN) 2 % cream Apply 1 Application topically 2 (two) times daily. To external vulvar area, for 7 days   Multiple Vitamin (MULTIVITAMIN WITH MINERALS) TABS tablet Take 1 tablet by mouth daily.   oxymetazoline  (AFRIN) 0.05 % nasal spray Place 1 spray into both nostrils 2 (two) times daily as needed for congestion.   pantoprazole  (PROTONIX ) 40 MG tablet Take 1 tablet (40 mg total) by mouth daily.   rosuvastatin  (CRESTOR ) 5 MG tablet TAKE 1 TABLET BY MOUTH THREE TIMES A WEEK   traMADol  (ULTRAM ) 50 MG tablet TAKE 1 TABLET BY MOUTH TWICE DAILY AS NEEDED FOR MODERATE PAIN   vitamin B-12 (CYANOCOBALAMIN ) 500 MCG tablet Take 1 tablet (500 mcg total) by mouth once a week.   No facility-administered encounter medications on file as of 05/28/2024.

## 2024-05-28 ENCOUNTER — Encounter: Payer: Self-pay | Admitting: Family Medicine

## 2024-05-28 ENCOUNTER — Ambulatory Visit: Admitting: Family Medicine

## 2024-05-28 VITALS — BP 130/70 | HR 58 | Temp 99.4°F | Ht 64.0 in | Wt 118.0 lb

## 2024-05-28 DIAGNOSIS — M1711 Unilateral primary osteoarthritis, right knee: Secondary | ICD-10-CM | POA: Diagnosis not present

## 2024-05-28 MED ORDER — TRIAMCINOLONE ACETONIDE 40 MG/ML IJ SUSP
40.0000 mg | Freq: Once | INTRAMUSCULAR | Status: AC
  Administered 2024-05-28: 40 mg via INTRA_ARTICULAR

## 2024-06-02 ENCOUNTER — Other Ambulatory Visit: Payer: Self-pay | Admitting: Internal Medicine

## 2024-06-03 MED ORDER — DOFETILIDE 125 MCG PO CAPS: ORAL_CAPSULE | ORAL | 1 refills | Status: AC

## 2024-07-15 ENCOUNTER — Ambulatory Visit

## 2024-09-25 ENCOUNTER — Encounter: Admitting: Family Medicine

## 2024-09-25 ENCOUNTER — Ambulatory Visit
